# Patient Record
Sex: Male | Born: 1935 | Race: White | Hispanic: No | Marital: Married | Smoking: Never smoker
Health system: Southern US, Community
[De-identification: ages and names within clinical notes are randomized; demographics above are authoritative.]

## PROBLEM LIST (undated history)

## (undated) DIAGNOSIS — I2510 Atherosclerotic heart disease of native coronary artery without angina pectoris: Secondary | ICD-10-CM

## (undated) DIAGNOSIS — I350 Nonrheumatic aortic (valve) stenosis: Secondary | ICD-10-CM

## (undated) DIAGNOSIS — M1990 Unspecified osteoarthritis, unspecified site: Secondary | ICD-10-CM

## (undated) DIAGNOSIS — I70229 Atherosclerosis of native arteries of extremities with rest pain, unspecified extremity: Secondary | ICD-10-CM

## (undated) DIAGNOSIS — I209 Angina pectoris, unspecified: Secondary | ICD-10-CM

## (undated) DIAGNOSIS — I998 Other disorder of circulatory system: Secondary | ICD-10-CM

## (undated) DIAGNOSIS — I480 Paroxysmal atrial fibrillation: Secondary | ICD-10-CM

## (undated) DIAGNOSIS — I739 Peripheral vascular disease, unspecified: Secondary | ICD-10-CM

## (undated) DIAGNOSIS — N184 Chronic kidney disease, stage 4 (severe): Secondary | ICD-10-CM

## (undated) DIAGNOSIS — R011 Cardiac murmur, unspecified: Secondary | ICD-10-CM

## (undated) DIAGNOSIS — I10 Essential (primary) hypertension: Secondary | ICD-10-CM

## (undated) DIAGNOSIS — D689 Coagulation defect, unspecified: Secondary | ICD-10-CM

## (undated) DIAGNOSIS — F419 Anxiety disorder, unspecified: Secondary | ICD-10-CM

## (undated) DIAGNOSIS — K219 Gastro-esophageal reflux disease without esophagitis: Secondary | ICD-10-CM

## (undated) HISTORY — PX: OTHER SURGICAL HISTORY: SHX169

## (undated) HISTORY — PX: PROSTATE SURGERY: SHX751

## (undated) HISTORY — DX: Atherosclerosis of native arteries of extremities with rest pain, unspecified extremity: I70.229

## (undated) HISTORY — PX: ENDARTERECTOMY: SHX5162

## (undated) HISTORY — DX: Atherosclerotic heart disease of native coronary artery without angina pectoris: I25.10

## (undated) HISTORY — DX: Other disorder of circulatory system: I99.8

## (undated) HISTORY — DX: Nonrheumatic aortic (valve) stenosis: I35.0

## (undated) HISTORY — PX: CARDIAC VALVE REPLACEMENT: SHX585

## (undated) HISTORY — PX: CARDIAC CATHETERIZATION: SHX172

## (undated) HISTORY — DX: Coagulation defect, unspecified: D68.9

---

## 2005-10-07 ENCOUNTER — Ambulatory Visit (HOSPITAL_BASED_OUTPATIENT_CLINIC_OR_DEPARTMENT_OTHER): Admission: RE | Admit: 2005-10-07 | Discharge: 2005-10-07 | Payer: Self-pay | Admitting: Urology

## 2005-10-07 ENCOUNTER — Encounter (INDEPENDENT_AMBULATORY_CARE_PROVIDER_SITE_OTHER): Payer: Self-pay | Admitting: Specialist

## 2008-10-03 ENCOUNTER — Ambulatory Visit (HOSPITAL_BASED_OUTPATIENT_CLINIC_OR_DEPARTMENT_OTHER): Admission: RE | Admit: 2008-10-03 | Discharge: 2008-10-03 | Payer: Self-pay | Admitting: Urology

## 2008-10-03 ENCOUNTER — Encounter (INDEPENDENT_AMBULATORY_CARE_PROVIDER_SITE_OTHER): Payer: Self-pay | Admitting: Urology

## 2010-06-03 HISTORY — PX: OTHER SURGICAL HISTORY: SHX169

## 2010-06-04 ENCOUNTER — Ambulatory Visit (HOSPITAL_COMMUNITY): Admission: RE | Admit: 2010-06-04 | Discharge: 2010-06-05 | Payer: Self-pay | Admitting: Cardiovascular Disease

## 2010-10-27 LAB — URINE MICROSCOPIC-ADD ON

## 2010-10-27 LAB — URINALYSIS, ROUTINE W REFLEX MICROSCOPIC
Leukocytes, UA: NEGATIVE
Nitrite: NEGATIVE
Specific Gravity, Urine: 1.013 (ref 1.005–1.030)
Urobilinogen, UA: 0.2 mg/dL (ref 0.0–1.0)

## 2010-10-27 LAB — BASIC METABOLIC PANEL
BUN: 34 mg/dL — ABNORMAL HIGH (ref 6–23)
Calcium: 8.6 mg/dL (ref 8.4–10.5)
Chloride: 110 mEq/L (ref 96–112)
Creatinine, Ser: 2.03 mg/dL — ABNORMAL HIGH (ref 0.4–1.5)
GFR calc Af Amer: 38 mL/min — ABNORMAL LOW (ref >60)
GFR calc non Af Amer: 32 mL/min — ABNORMAL LOW (ref >60)
Potassium: 4.8 mEq/L (ref 3.5–5.1)
Sodium: 135 mEq/L (ref 135–145)

## 2010-10-27 LAB — GLUCOSE, CAPILLARY
Glucose-Capillary: 117 mg/dL — ABNORMAL HIGH (ref 70–99)
Glucose-Capillary: 189 mg/dL — ABNORMAL HIGH (ref 70–99)

## 2010-10-27 LAB — CBC
HCT: 34.2 % — ABNORMAL LOW (ref 39.0–52.0)
MCV: 91 fL (ref 78.0–100.0)
RDW: 13.9 % (ref 11.5–15.5)
WBC: 7.7 10*3/uL (ref 4.0–10.5)

## 2010-10-27 LAB — TSH: TSH: 3.734 u[IU]/mL (ref 0.350–4.500)

## 2010-10-27 LAB — APTT: aPTT: 29 seconds (ref 24–37)

## 2010-11-30 LAB — POCT I-STAT 4, (NA,K, GLUC, HGB,HCT)
Glucose, Bld: 107 mg/dL — ABNORMAL HIGH (ref 70–99)
HCT: 38 % — ABNORMAL LOW (ref 39.0–52.0)
Hemoglobin: 12.9 g/dL — ABNORMAL LOW (ref 13.0–17.0)
Potassium: 4.3 mEq/L (ref 3.5–5.1)
Sodium: 142 mEq/L (ref 135–145)

## 2010-11-30 LAB — GLUCOSE, CAPILLARY: Glucose-Capillary: 134 mg/dL — ABNORMAL HIGH (ref 70–99)

## 2010-12-28 NOTE — Op Note (Signed)
Collin Pearson, Collin Pearson              ACCOUNT NO.:  [id number]   MEDICAL RECORD NO.:  [id number]          PATIENT TYPE:  AMB   LOCATION:  [facility]                         FACILITY:  [facility]   PHYSICIAN:  Mark C. Karsten Ro, M.D.  DATE OF BIRTH:  1927/06/02   DATE OF PROCEDURE:  DATE OF DISCHARGE:                               OPERATIVE REPORT   PREOPERATIVE DIAGNOSES:  1. 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  2. History of gross hematuria.   PROCEDURES:  1. Cystoscopy.  2. Bilateral ureteral catheterization.  3. Bladder biopsy.   SURGEON:  Penny Pia, M.D.   ANESTHESIA:  General.   SPECIMENS:  1. Right renal pelvis barbotage for cytology.  2. Left renal pelvis barbotage for cytology.  3. Bladder barbotage for cytology.  4. Cold cup bladder biopsies from the right wall,left wall and left      posterior wall.   Marland KitchenDRAINS:  None.   BLOOD LOSS:  Minimal.   COMPLICATIONS:  None.   INDICATIONS:  The patient is a 77 year old male who had an episode of  gross hematuria 1 month ago.  It was primarily terminal hematuria and he  was evaluated and found to have a negative NMP 22, CT scan and office  cystoscopy.  He has no prior history of transitional cell carcinoma.  However, a urine cytology was performed and revealed atypical urothelial  cells.  He, therefore, is brought to the operating room for further  evaluation of this finding.  The risks, complications, alternatives and  limitations have been discussed.  He understands and has elected to  proceed.  He has stopped his Plavix.   DESCRIPTION OF OPERATION:  After informed consent, the patient was  brought to the major OR, placed on the table, administered general  anesthesia and then moved to the dorsal lithotomy position.  His  genitalia was sterilely prepped and draped and an official time-out was  then performed.  Initially, the 22-French cystoscope with 12 degrees  lens was inserted per urethra and under direct visualization passed down  through  the urethra which was noted be normal, through the sphincter and  into the area of the prostatic urethra which revealed no evidence of  obstruction or lesions.  I then entered the bladder and noted no  specific abnormality.  The bladder was then fully inspected and noted to  be free of any tumor, stones or inflammatory lesions.   I then used saline to fill the bladder and then barbotage the bladder  with this saline being sent for cytology labeled bladder barbotage.   The right ureteral orifice was identified and a 0.038 inch floppy tip  guidewire was then passed up the right ureter under direct fluoroscopic  control into the area of the renal pelvis.  A 6-French open-ended  ureteral catheter was then passed over the guidewire into the area of  the renal pelvis and the guidewire was removed.  I instilled 10 mL of  sterile saline through the open-ended catheter and performed barbotage  of the right renal pelvis and this fluid was then removed and sent to  Pathology labeled barbotage of right  renal pelvis.  I then removed the  open-ended catheter and performed an identical procedure on the left-  hand side and sent this labeled left renal pelvis barbotage.   I then inserted the cold cup biopsy forceps and did note an area that  appeared to be slightly erythematous on the left-hand wall although it  did not appear suspicious.  It was biopsied and labeled left lateral  wall biopsy.  A right lateral wall biopsy was obtained also from an area  that was slightly reddened and then a third cold cup biopsy from the  left posterior wall was obtained.  The Bugbee electrode was then  inserted and the biopsy sites were fulgurated.  The bladder was then  inspected and noted to be fully intact without further bleeding so the  bladder was drained and the cystoscope was removed and the patient was  awakened and taken to the recovery room in stable satisfactory  condition.  He tolerated the procedure well  with no intraoperative  complications.   He will be given a prescription for 200 mg Pyridium #36 and Vicodin #24.  He will follow-up in my office in 1-2 weeks.      Mark C. Karsten Ro, M.D.  Electronically Signed     MCO/MEDQ  D:  10/03/2008  T:  10/03/2008  Job:  [id number]

## 2010-12-31 NOTE — Op Note (Signed)
Collin Pearson, Collin Pearson              ACCOUNT NO.:  [id number]   MEDICAL RECORD NO.:  [id number]          PATIENT TYPE:  AMB   LOCATION:  [facility]                         FACILITY:  [facility]   PHYSICIAN:  Collin Pearson, M.D.  DATE OF BIRTH:  1926-08-21   DATE OF PROCEDURE:  10/07/2005  DATE OF DISCHARGE:                                 OPERATIVE REPORT   PREOPERATIVE DIAGNOSIS:  Frequency, rule out carcinoma in situ versus  interstitial cystitis.   POSTOPERATIVE DIAGNOSIS:  Frequency, rule out carcinoma in situ versus  interstitial cystitis.   PROCEDURE:  Cystoscopy, bladder biopsies and hydrodistention.   SURGEON:  Collin Pearson, M.D.   ANESTHESIA:  General.   SPECIMENS:  Cold cup biopsies of the bladder.   ESTIMATED BLOOD LOSS:  Minimal.   COMPLICATIONS:  None.   DRAINS:  None.   INDICATIONS:  The patient is a 77 year old white male who has been under a  urologist's care for some time and has not seen any improvement. I have  reviewed notes where about 10 years ago, he underwent laser prostatectomy at  [facility]. He was found at that time by urodynamics to have a very  small capacity bladder and his chief complaint is nocturia eight to nine  times, frequency, urgency but no urge incontinence. He has been tried on  [employer], Kulm, Detrol LA,  Ditropan an Oxytrol patches, none of which  have been successful. His PVR was 60 mL. He underwent urodynamics which  revealed the patient's bladder was painfully full at 123 mL. The bladder  appeared hypersensitive and he had sensory, urgency but no bladder  instability or uninhibited contractions. He had difficulty initiating his  stream. Cystoscopy that day revealed no obstruction, 2+ trabeculation of the  bladder and there were some areas of scarring on the bladder wall consistent  with prior biopsy although I did not get any biopsy results from [facility]. He is brought to the OR today for cystoscopy under anesthesia  to  determine his bladder capacity and also to biopsy the bladder to rule out  CIS. The risks, complications and alternatives have been discussed. He  understands and elected to proceed.   DESCRIPTION OF OPERATION:  After informed consent, the patient was brought  to the major OR and placed on the table, administered general anesthesia and  then moved to the dorsal lithotomy position. His genitalia was sterilely  prepped and draped. The 22-French cystoscope was then introduced in the  urethra. I inserted the 12 degree lens and then visualized the urethra  through its entire length and noted it to be normal. The prostatic urethra  revealed that it was well resected. The veru appeared absent but the  sphincter was definitely intact. The bladder neck was wide open and there  were no lesions to the prostatic urethra and it remained well resected. Upon  entering the bladder, I noted 1-2+ trabeculation, some old scars in the  posterior wall. There were no definite erythematous patches or worrisome  lesions, no papillary lesions were identified. Ureteral orifices normal  configuration and position.   I  performed hydrodistention by filling the bladder through the cystoscope to  80 cmH2O pressure. In doing so, I noted a very odd finding in the bladder  wall. It appeared the bladder wall was so thin on both right and left sides  but more prominent more so on the right that I could see through the bladder  wall and could see perivesical fat. This was clearly fat not scar tissue and  I photographed this. This occurred only on the right and left walls. The  remainder of the bladder appeared normal. I then drained the bladder through  the cystoscope and found the bladder capacity under anesthesia at 80 cmH2O  pressure to be only 300 mL. I then reinserted the cystoscope, redistended  the bladder and remeasured again noting it remained at 300 mL.   The cold cup biopsy forceps were inserted and I took a  biopsy from the  posterior wall on the left-hand side nowhere near the area of thinning and  then also from the directly posterior wall. These locations were then  cauterized with the Bugbee electrode. I then drained the bladder and removed  the cystoscope. The patient was awakened and taken to the recovery room in  stable satisfactory condition. He tolerated the procedure well. There were  no intraoperative complications.   I am going to give him a prescription for some Vicodin ES and some Pyridium  plus. I will plan to see him back in the office in 10-14 days and will  consider having him seen by Dr. Vikki Pearson to see if he has any thoughts as  to how to improve this patient's condition, although it appears there were  minimal options which would include a Foley catheter, augmentation  cystoplasty or possibly the use of Botox although I am not sure of that.      Collin Pearson, M.D.  Electronically Signed     MCO/MEDQ  D:  10/07/2005  T:  10/10/2005  Job:  [id number]

## 2012-05-03 ENCOUNTER — Encounter: Payer: Medicare Other | Admitting: Cardiothoracic Surgery

## 2012-05-03 ENCOUNTER — Institutional Professional Consult (permissible substitution) (INDEPENDENT_AMBULATORY_CARE_PROVIDER_SITE_OTHER): Payer: Medicare Other | Admitting: Cardiothoracic Surgery

## 2012-05-03 ENCOUNTER — Encounter: Payer: Self-pay | Admitting: Cardiothoracic Surgery

## 2012-05-03 VITALS — BP 146/58 | HR 56 | Resp 16 | Ht 65.5 in | Wt 143.0 lb

## 2012-05-03 DIAGNOSIS — I350 Nonrheumatic aortic (valve) stenosis: Secondary | ICD-10-CM | POA: Insufficient documentation

## 2012-05-03 DIAGNOSIS — I2510 Atherosclerotic heart disease of native coronary artery without angina pectoris: Secondary | ICD-10-CM | POA: Insufficient documentation

## 2012-05-03 DIAGNOSIS — I359 Nonrheumatic aortic valve disorder, unspecified: Secondary | ICD-10-CM

## 2012-05-03 DIAGNOSIS — N289 Disorder of kidney and ureter, unspecified: Secondary | ICD-10-CM

## 2012-05-03 DIAGNOSIS — N189 Chronic kidney disease, unspecified: Secondary | ICD-10-CM | POA: Insufficient documentation

## 2012-05-03 DIAGNOSIS — D689 Coagulation defect, unspecified: Secondary | ICD-10-CM | POA: Insufficient documentation

## 2012-05-03 NOTE — Progress Notes (Signed)
[address]            [place] [address]          [phone]      Markis H Callender [place] Medical Record [id number] Date of Birth: 25-Mar-1927  Referring: Sanda Klein, MD Primary Care: Jenean Lindau, MD Renal: Dr Posey Pronto  Chief Complaint:    Chief Complaint  Patient presents with  . Aortic Stenosis    AORTIC VALVE STENOSIS /REFERRAL DR.CROITORU    History of Present Illness:    Patient is an 78 year old male with long history of critical aortic stenosis recent echocardiogram showed a velocity across his aortic valve of 477 cm/s. He has long-standing history of cardiovascular disease having had carotid endarterectomies bilaterally 25 years ago and stents placed in both carotid arteries, he has known coronary occlusive disease with multiple coronary stents. He also has stage IV renal disease. He comes in today to discuss the risks and options of aortic valve replacement in the setting of diffuse vascular disease and end-stage renal disease that likely will result in dialysis.  The patient has no cardiac symptoms at rest or light activity around the house. He was digging holes in the yard Saturday deployment wishes without chest pain. He does note though with much exertion he'll have chest discomfort with relief quickly when he stops. He's had no overt symptoms of congestive heart failure denies orthopnea or pedal edema he's had no syncope or presyncope.   Current Activity/ Functional Status: Patient is independent with mobility/ambulation, transfers, ADL's, IADL's.   Past Medical History  Diagnosis Date  . Clotting disorder   . Coronary artery disease   . AS (aortic stenosis)   . End stage renal disease      past surgical history: Bilateral carotid endarterectomies 25 years ago, followed by bilateral carotid stents approximately 10 years ago    family history : Patient's father died of a myocardial infarction age 3 mother died of  stroke at age 39, his brother died at age 1 after having a pig valve aortic replacement 20 years prior  History   Social History  . Marital Status: Married    Spouse Name: N/A    Number of Children: N/A  . Years of Education: N/A   Occupational History  .  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 Smoking status: Never Smoker   . Smokeless tobacco: Never Used  . Alcohol Use: No  . Drug Use: Not on file  . Sexually Active: Not on file              History  Smoking status  . Never Smoker   Smokeless tobacco  . Never Used    History  Alcohol Use: none     No Known Allergies  Current Outpatient Prescriptions  Medication Sig Dispense Refill  . acetaminophen (TYLENOL) 325 MG tablet Take 325 mg by mouth every 6 (six) hours as needed.      Marland Kitchen aspirin EC 81 MG tablet Take 81 mg by mouth 2 (two) times daily.      Marland Kitchen atenolol (TENORMIN) 50 MG tablet Take 50 mg by mouth daily.      Marland Kitchen atorvastatin (LIPITOR) 40 MG tablet Take 40  mg by mouth at bedtime.      Marland Kitchen epoetin alfa (EPOGEN,PROCRIT) 29562 UNIT/ML injection Inject 10,000 Units into the skin 3 (three) times a week.      . fenofibrate (TRICOR) 145 MG tablet Take 145 mg by mouth daily.      . ferrous fumarate (HEMOCYTE - 106 MG FE) 325 (106 FE) MG TABS Take 65 mg of iron by mouth 2 (two) times daily.      . fish oil-omega-3 fatty acids 1000 MG capsule Take 1 g by mouth 3 (three) times daily.      Marland Kitchen glipiZIDE (GLUCOTROL) 5 MG tablet Take 5 mg by mouth. TAKE  5mg  in morning, and 2.5mg  in the afternoon      . lisinopril (PRINIVIL,ZESTRIL) 10 MG tablet Take 10 mg by mouth daily.      . Multiple Vitamin (MULTIVITAMIN) capsule Take 1 capsule by mouth daily.      . niacin 500 MG tablet Take 500 mg by mouth daily with breakfast.      . NIFEdipine (PROCARDIA XL/ADALAT-CC) 90 MG 24 hr  tablet Take 90 mg by mouth daily.      . nitroGLYCERIN (NITROSTAT) 0.4 MG SL tablet Place 0.4 mg under the tongue every 5 (five) minutes as needed.      . pantoprazole (PROTONIX) 40 MG tablet Take 40 mg by mouth daily.      . solifenacin (VESICARE) 5 MG tablet Take 10 mg by mouth daily.      . Tamsulosin HCl (FLOMAX) 0.4 MG CAPS Take 0.4 mg by mouth at bedtime.      . torsemide (DEMADEX) 20 MG tablet Take 20 mg by mouth daily.           Review of Systems:     Cardiac Review of Systems: Y or N  Chest Pain [  y  ]  Resting SOB [n   ] Exertional SOB  [n  ]  Orthopnea [ n ]   Pedal Edema [ n  ]    Palpitations [n  ] Syncope  [n  ]   Presyncope [ n  ]  General Review of Systems: [Y] = yes [  ]=no Constitional: recent weight change [  ]; anorexia [  ]; fatigue Blue.Reese  ]; nausea [ n ]; night sweats [ n ]; fever [ n ]; or chills [  n];                                                                                                                                          Dental: poor dentition[n  ]; Last Dentist visit: 6 months ago  Eye : blurred vision [  ]; diplopia [   ]; vision changes [  ];  Amaurosis fugax[  ]; Resp: cough [  ];  wheezing[  ];  hemoptysis[  ]; shortness of breath[  ]; paroxysmal nocturnal  dyspnea[  ]; dyspnea on exertion[  ]; or orthopnea[  ];  GI:  gallstones[  ], vomiting[  ];  dysphagia[  ]; melena[  ];  hematochezia [  ]; heartburn[  ];   Hx of  Colonoscopy[  ]; GU: kidney stones [  ]; hematuria[ ypast ];   dysuria [  ];  nocturia[  ];  history of     obstruction [ n ];             Skin: rash, swelling[  ];, hair loss[  ];  peripheral edema[  ];  or itching[  ]; Musculosketetal: myalgias[  ];  joint swelling[  ];  joint erythema[  ];  joint pain[  ];  back pain[  ];  Heme/Lymph: bruising[  ];  bleeding[  ];  anemia[  ];  Neuro: TIA[  ];  headaches[  ];  stroke[  ];  vertigo[  ];  seizures[  ];   paresthesias[  ];  difficulty walking[  ];  Psych:depression[  ]; anxiety[   ];  Endocrine: diabetes[  ];  thyroid dysfunction[  ];  Immunizations: Flu [ y]; Pneumococcal[ y ];  Other:  Physical Exam: BP 146/58  Pulse 56  Resp 16  Ht 5' 5.5" (1.664 m)  Wt 143 lb (64.864 kg)  BMI 23.43 kg/m2  SpO2 96%  General appearance: alert, cooperative, appears stated age, fatigued and no distress Neurologic: intact Heart: regular rate and rhythm and systolic murmur: holosystolic 4/6, crescendo throughout the precordium Lungs: clear to auscultation bilaterally and normal percussion bilaterally Abdomen: soft, non-tender; bowel sounds normal; no masses,  no organomegaly Extremities: extremities normal, atraumatic, no cyanosis or edema and distal puses are not palpable at ankles bil carotid bruits   Diagnostic Studies & Laboratory data:     Recent Radiology Findings:   No results found.    Recent Lab Findings: Lab Results  Component Value Date   WBC 7.7 06/04/2010   HGB 11.2* 06/04/2010   HCT 34.2* 06/04/2010   PLT 189 06/04/2010   GLUCOSE 141* 06/05/2010   NA 135 06/05/2010   K 4.2 06/05/2010   CL 110 06/05/2010   CREATININE 2.03* 06/05/2010   BUN 31* 06/05/2010   CO2 21 06/05/2010   TSH 3.734 06/04/2010   INR 1.06 06/04/2010   most recent creatinine recorded endocrine system is 2.03 and a BUN of 31   No recent cardiac cath most recent 2011: At bedtime left main was widely patent LAD has a long stent in the proximal portion with a jailed first diagonal and 90% stenosis, small ramus, large circumflex second obtuse marginal totally occluded right coronary is a large dominant vessel with a 40-50% ostial stenosis patient had preserved left ventricular systolic function  Echocardiogram at [facility] dated 03/19/2012 showed normal aortic root size maximum velocity across the aortic valve 477 cm/s estimated MAC peak gradient of 91 gradient of 64 valve area of 0.62    Assessment / Plan:      I've reviewed with the patient the diagnosis of  critical aortic stenosis symptomatic in the setting of severe end-stage renal disease and likely need of dialysis in the near future. The risks of aortic valve replacement and coronary bypass have been reviewed in detail. I have discussed with the patient percutaneous valve replacement in addition. He would like to discuss the options with his wife before making a final decision. He'll need cardiac catheterization prior to surgery. Dr. Posey Pronto will need to coordinate with  cardiology concerning his renal function. The issue of having a dialysis graft place prior to surgery, or use a temporary catheter if dialysis is is needed in the immediate postoperative period.  The patient will call back in the next several days and let cardiology know if he wishes to proceed. He will need to be off Plavix at least 7 days prior to surgery.     Grace Isaac MD  Beeper [phone] Office [phone] 05/03/2012 1:30 PM

## 2012-05-03 NOTE — Patient Instructions (Addendum)
Aortic Stenosis Aortic stenosis, or aortic valve stenosis, is a narrowing of the aortic valve. When the aortic valve is narrowed, the valve does not open and close very well. This restricts blood flow between the left side of the heart and the aorta (the large artery which takes blood to the rest of the body). This restriction makes it hard for your heart to pump blood. This extra work can weaken your heart and can lead to heart failure. CAUSES   Causes of aortic valve stenosis can vary. Some of these can include:  Calcium deposits on the aortic valve. Calcium can buildup on the aortic valve and make it stiff. This cause of aortic stenosis is most common in people over the age of 34.   Congenital heart defect. This can occur during the development of the fetus and can result in an aortic valve defect.   Rheumatic fever. Rheumatic fever is a bacterial infection that can develop from a strep throat infection. The bacteria from rheumatic fever can attach themselves to the valve. This can cause scarring on the aortic valve, causing it to become narrow.  SYMPTOMS   Symptoms of aortic valve stenosis develop when the valve disease is severe. Symptoms can include:  Shortness of breath, especially with physical activity.   Feeling tired (fatigue).   Chest pain (angina) or tightness.   Feeing your heart race or beat funny (heart palpitations).   Dizziness or fainting.  DIAGNOSIS   Aortic stenosis is diagnosed through:  A physical exam and symptoms.   A heart murmur.   Echocardiography. This test uses sound waves to produce images of your heart.  TREATMENT    Surgery is the treatment for aortic valve stenosis.   Surgery may not be needed right away. Surgery is necessary when narrowing of the aortic valve becomes severe, and symptoms develop or become worse.   Medications cannot reverse aortic valve stenosis.  HOME CARE INSTRUCTIONS    If you have aortic stenosis, you many need to avoid  strenuous physical activity. Talk with your caregiver about what types of activities you should avoid.   If you are a woman with aortic valve stenosis and are of child-bearing age, talk to your caregiver before you become pregnant.   If you become pregnant, you will need to be monitored by your obstetrician and cardiologist throughout your pregnancy, labor and delivery, and after delivery.  SEEK IMMEDIATE MEDICAL CARE IF:  You develop chest pain or tightness.   You develop shortness of breath or difficulty breathing.   You develop lightheadedness or fainting.   You have heart palpitations or skipped heartbeats.  Document Released: 04/30/2003 Document Revised: 07/21/2011 Document Reviewed: 12/08/2009 [facility] Patient Information 2012 [place] .Chronic Renal Insufficiency Chronic renal insufficiency (also called kidney failure) occurs when there is kidney damage done. The damage prevents the kidneys from working like they should.   The kidneys do many important things. They:  Filter waste out of the blood.   Regulate the amount of water and various salts in the blood stream.   Produce chemicals that:   Prompt the bone marrow to make red blood cells.   Regulate blood pressure.   Keep calcium in balance throughout the bones and the body.  When the kidneys are damaged, they can no longer filter waste products out of the blood. These substances build up in the blood, causing illness.   CAUSES    Diabetes.   High blood pressure.   Glomerular diseases: Conditions that  damage the tiny blood vessels (glomeruli) within the kidneys, such as:   Membranous nephropathy.   IgA nephropathy.   Focal segmental glomerulosclerosis.   Poisons (such as overdoses or misuse of acetaminophen or NSAIDS, or exposure to other toxic substances).   Kidney injuries.   Kidney cancer or cancer that spreads to the kidney.   Medications such as NSAIDs. These problems are rare.   Kidney  stones.   Alport disease.   Polycystic kidneys.  SYMPTOMS   Most people do not notice symptoms of kidney failure until their kidney function drops below about 30-40% of normal. Symptoms can include:  Weakness.   Tiredness.   Frequent urination.   Intense need to urinate.   Excess bruising.   Low urine production.   Blood in the urine.   Pain in the kidney area.   Feeling sick to your stomach (nausea).   Vomiting.   Unusual bleeding.   Numbness in hands and feet.   Swelling in legs, arms and face.   Confusion.  DIAGNOSIS   Your caregiver will look for signs of kidney failure. Tests to diagnose kidney failure may include:  Urine tests: May reveal the presence of blood, protein or sugar.   Blood tests: May show low red blood cell count (anemia) or high levels of waste products (BUN and creatinine) that are normally filtered out of the bloodstream by the kidneys.   Imaging tests - These are tests that create pictures of the organs inside the abdomen, such as the kidneys. They may reveal masses growing in the kidneys or blockages to the flow of urine. Possible imaging tests may include:   Ultrasound.   CT scan.   MRI.   Intravenous pyelogram or IVP. This is a test that involves injecting dye into the bloodstream and then taking a series of x-rays of the kidneys. This allows the kidneys and other parts of the urinary system to be viewed more clearly.   Kidney biopsy - A small sample of kidney is removed using a special needle. The sample is examined for abnormalities under a microscope.  TREATMENT   Chronic kidney failure cannot usually be cured. The various symptoms are treated, and measures are taken to avoid further kidney damage. Treatment for mild to moderate kidney failure may include:  Medication for high blood pressure.   Good control of diabetes.   Medication and diet change to improve anemia.   A low-sodium, low-potassium, low-protein and/or  low-cholesterol diet.   Limiting the quantity of liquids in the diet.  Treatment for more severe kidney failure may require:  Dialysis - Mechanical methods of filtering the blood.   Kidney transplant - An operation that removes the diseased kidney and replaces it with a donated kidney.  HOME CARE INSTRUCTIONS    Take medication as told by your caregiver.   Quit smoking if you are a smoker. Talk to your caregiver about a smoking cessation program.   Follow your prescribed diet.   If you are prescribed vitamins, take them as told.  SEEK IMMEDIATE MEDICAL CARE IF:  You start to produce less urine.   You notice blood in your urine.   You have increased pain.   You have increased weakness, fatigue or confusion.   You notice new swelling.   You develop a fever.   You feel that you are having side effects of medicines prescribed.  Document Released: 05/10/2008 Document Revised: 07/21/2011 Document Reviewed: 08/23/2010 [facility] Patient Information 2012 [place].  End Stage  Kidney Disease End-stage kidney disease occurs when your kidneys no longer work well enough to support day-to-day life. It usually occurs when longstanding (chronic) kidney failure gets worse, to the point where kidney function is less than 10% of normal. At this point, the kidney function is so low that death will occur from buildup of fluids and waste products in the body. The most common cause of kidney failure is diabetes. Kidney failure is very common. ESRD (End Stage Renal Disease) almost always follows chronic kidney failure or renal insufficiency. This condition may exist for 10 to 20 years or more before developing into ESRD. SYMPTOMS    Unintentional weight loss.   Fatigue, anemia.   Generalized itching (pruritus).   Easy bruising or bleeding.   Drowsiness, lethargy.   Muscle twitching or cramps.   Skin may appear yellow or brown.   General ill feeling.   Frequent hiccups.   No or  decreased urine output.   Decreased alertness.   Coma.   Increased skin pigmentation.   Decreased sensation in the hands, feet, or other areas.  DIAGNOSIS   Your caregiver will be able to tell what is wrong by talking to you, doing an examination, and doing laboratory tests. The blood work and urinalysis will show that your kidneys are not working well enough. TREATMENT   Dialysis or kidney transplantation are the only treatments for ESRD. Your health, age and other factors determine which treatment is best. Other treatments for chronic renal failure should continue. Associated diseases that cause kidney failure should be controlled. Some of these are:  Hypertension.   Kidney stones.   Heart failure.   Obstructions of the urinary tract.   Urinary tract infections.   Glomerulonephritis.  PROGNOSIS   ESRD is fatal unless treated with dialysis or transplantation. Both of these treatments can have serious risks and consequences. The outcome varies and is unique to each individual. RISKS AND COMPLICATIONS Complications of dialysis and kidney transplantation:  Hypertension (kidneys try to raise blood pressure so they can work better).   Platelet dysfunction (cells in blood which help with clotting are defective).   Gastrointestinal loss of blood, duodenal or peptic ulcers.   Hemorrhage (bleeding problems).   Anemia (not enough blood cells are produced).   Hepatitis B, hepatitis C, liver failure (exposure may occur during dialysis).   Infection from decreased operation of white blood cells and immune system.   Multiple cancers form, from long-term immunosuppressant use.   Peripheral neuropathy (damage to your nerves).   Seizures (convulsions).   Encephalopathy, nervous system damage, dementia (changes in your brain).   Weakening of the bones, fractures, joint disorders, joint replacements are common.   Permanent skin pigmentation changes.   Skin dryness, itching,  scratching resulting in skin infection from hydration problems.   Changes in glucose metabolism.   Changes in electrolyte levels (salts in your blood).   Decreased libido, impotence (loss of interest in sex or ability to function well).   Miscarriage, menstrual irregularities, infertility.   Pericarditis (inflammation of the lining surrounding the heart).   Cardiac tamponade (fluid collection around the heart).   Heart failure in which your heart cannot pump well enough to keep up with the work.  PREVENTION   Treatment of the causes of longstanding kidney failure may delay or prevent progression to ESRD. For example, diabetes which is under strict control is less likely to cause renal failure than diabetes which is left untreated. Some causes of renal failure cannot be treated.  Document Released: 10/22/2003 Document Revised: 07/21/2011 Document Reviewed: 08/01/2005 [facility] Patient Information 2012 [place] .Aortic Valve Replacement You have a disease of one of the valves of your heart. In you or your child's case, it is the aortic valve which needs replacing. Aortic valve replacement is open heart surgery done by a heart surgeon. This operation treats problems with the aortic valve. The aortic valve is the "outflow valve" for the left side of the heart. The left side of your heart (left ventricle) is the large muscular part of the heart that pumps blood to the rest of the body. It separates the left ventricle from the aorta. When the heart squeezes down (contracts), the aortic valve is what keeps the blood from flowing back into the ventricle from the aorta. This allows the blood to keep moving through the body.   Surgery may be necessary when the valve does not open or close completely. A stenotic (narrow) valve does not let the blood leave the heart normally. This causes blood to back up in the left ventricle. This makes it hard for the heart to increase the amount of blood that it  pumps. The heart has to work harder. This may produce shortness of breath and fatigue. Problems are worse with activity.   If the valve leaflets do not meet correctly when closing, blood may leak backward into the ventricle each time the heart pumps. This is called aortic insufficiency. When some of the blood leaks backwards, the heart has to work even harder. The heart can allow for this over-work for a long time if the leakage came on slowly. Eventually, the heart fails.   Aortic valve problems may be caused by a birth defect. This is called congenital. Wear and tear can cause valves to fail. More commonly, rheumatic fever may damage the aortic valve. Occasionally, the valve may be damaged by infection. This also causes the aortic valve to leak.   DESCRIPTION OF SURGERY Aortic valves can be repaired. When the valve is too damaged to repair, the valve must be replaced. A prosthetic (artificial) valve is used to do this. Valves damaged by rheumatic disease often must be replaced.   Two types of artificial valves are available:  Mechanical valves made entirely from man-made materials.   Biological valves which are made from animal tissues or taken from a cadaver.  Each has advantages and disadvantages. The choice of which type to use should be made by you and your surgeon. Your risks, age, lifestyle, other medical problems including the decision on whether to be on blood thinners the rest of your life all will help you decide on which type of valve to use. There are a number of good MECHANICAL PROSTHESES available. All work well. The main advantage of mechanical valves is that they do not wear out. Their main disadvantage is that blood clots easier on mechanical valves. If this happens the valve will not work normally. Because of this, patients with mechanical valves must take anticoagulants (blood thinners) for life. There is also a small but definite risk of blood clots causing stroke, even when taking  anticoagulants.   There are a number of BIOLOGICAL CHOICES for aortic valve replacement. Most are made from pig aortic valves. Some are taken from cadavers. The main advantage is that they have a reduced risk of blood clots forming on the valve. This lessens the chance of the valve not working or causing a stroke. A large disadvantage of biological or tissue valves is  that they wear out sooner than mechanical valves. The rate at which they wear out depends on the patient's age. A young boy might wear out such a valve in only a few years. The same valve might last 10 years in a middle aged person, and even longer in a patient over the age of 61. A tissue valve used in a person over 54 years old may never need replacement. RISKS AND COMPLICATIONS Your cardiologist and cardiothoracic surgeon can best determine your individual risk. It will depend on your age, general condition, medical conditions, and your heart function. In general, the risks include:  Problems from the operation itself are low risk. Some common risks are:   Risks from the anesthesia.   Bleeding and infection.   Lifelong treatment with medications to prevent blood clots is needed for mechanical valve replacements.   Infection is more common with valve replacement than with valve repair.   Valve failure is more common with valve replacement than with valve repair. Pig valves tend to fail after about 8 to 10 years.  PROCEDURE   Valve repair or replacement is open-heart surgery. You are given general anesthesia (medications to help you sleep). You are then placed on a heart-lung machine. This machine provides oxygen to your blood while the heart is not working. The surgery generally lasts from 3 to 5 hours. During surgery, the surgeon makes a large incision (cut) in the chest. Sometimes the heart is cooled to slow or stop the heartbeat. The damaged aortic valve is either repaired or removed and replaced with an artificial heart  valve. AFTER THE PROCEDURE  Recovery from heart valve surgery usually involves a few days in an intensive care unit (ICU) of a hospital. Full recovery from heart valve surgery can take several months.   Anticoagulation (blood thinning) treatment with warfarin is often prescribed for 6 weeks to 3 months after surgery for those with biological valves. It is prescribed for life for those with mechanical valves.   Recovery includes healing of the surgical incision. There is a gradual building of stamina and exercise abilities. An exercise program under the direction of a physical therapist may be recommended.   Once you have an artificial valve, your heart function and your life will return to normal. You usually feel better after surgery. Shortness of breath and fatigue should lessen. If your heart was already severely damaged before your surgery, you may continue to have problems.   You can usually resume most of your normal activities. You will have to continue to monitor your condition. You need to watch out for blood clots and infections.   Artificial valves need to be replaced after a period of time. It is important that you see your caregiver regularly.   Some individuals with an aortic valve replacement need to take antibiotics before having dental work or other surgical procedures. This is called prophylactic antibiotic treatment. These drugs help to prevent infective endocarditis. Antibiotics are only recommended for individuals with the highest risk for developing infective endocarditis. Let your dentist and your caregiver know if you have a history of any of the following so that the necessary precautions can be taken:   A VSD.   A repaired VSD.   Endocarditis in the past.   An artificial (prosthetic) heart valve.  HOME CARE INSTRUCTIONS    Use all medications as prescribed.   Take your temperature every morning for the first week after surgery. Record these.   Weigh yourself  every  morning for at least the first week after surgery and record.   Do not lift more than 10 pounds (4.5 kg) until your sternum (breastbone) has healed. Avoid all activities which would place strain on your incision.   You may shower but do not take baths until instructed by your caregivers.   Avoid driving for 4 to 6 weeks following surgery or as instructed.   Use your elastic stockings during the day. You should wear the stockings for at least 2 weeks after discharge or longer if your ankles are swollen. The stockings help blood flow and help reduce swelling in the legs. It is easiest to put the stockings on before you get out of bed in the morning. They should fit snugly.  SEEK IMMEDIATE MEDICAL CARE IF:  You develop chest pain which is not coming from your incision (surgical cut) .   You develop shortness of breath.   You develop a temperature over 101 F (38.3 C).   You have a sudden weight gain. Let your caregiver know what the weight gain is.  Document Released: 12/21/2004 Document Revised: 07/21/2011 Document Reviewed: 07/28/2008 [facility] Patient Information 2012 [place] .Aortic Valve Replacement Care After Read the instructions outlined below and refer to this sheet for the next few weeks. These discharge instructions provide you with general information on caring for yourself after you leave the hospital. Your surgeon may also give you specific instructions. While your treatment has been planned according to the most current medical practices available, unavoidable complications occasionally occur. If you have any problems or questions after discharge, please call your surgeon. AFTER THE PROCEDURE  Full recovery from heart valve surgery can take several months.   Blood thinning (anticoagulation) treatment with warfarin is often prescribed for 6 weeks to 3 months after surgery for those with biological valves. It is prescribed for life for those with mechanical valves.    Recovery includes healing of the surgical incision. There is a gradual building of stamina and exercise abilities. An exercise program under the direction of a physical therapist may be recommended.   Once you have an artificial valve, your heart function and your life will return to normal. You usually feel better after surgery. Shortness of breath and fatigue should lessen. If your heart was already severely damaged before your surgery, you may continue to have problems.   You can usually resume most of your normal activities. You will have to continue to monitor your condition. You need to watch out for blood clots and infections.   Artificial valves need to be replaced after a period of time. It is important that you see your caregiver regularly.   Some individuals with an aortic valve replacement need to take antibiotics before having dental work or other surgical procedures. This is called prophylactic antibiotic treatment. These drugs help to prevent infective endocarditis. Antibiotics are only recommended for individuals with the highest risk for developing infective endocarditis. Let your dentist and your caregiver know if you have a history of any of the following so that the necessary precautions can be taken:   A VSD.   A repaired VSD.   Endocarditis in the past.   An artificial (prosthetic) heart valve.  HOME CARE INSTRUCTIONS    Use all medications as prescribed.   Take your temperature every morning for the first week after surgery. Record these.   Weigh yourself every morning for at least the first week after surgery and record.   Do not lift more  than 10 pounds (4.5 kg) until your breastbone (sternum) has healed. Avoid all activities which would place strain on your incision.   You may shower as soon as directed by your caregiver after surgery. Pat incisions dry. Do not rub incisions with washcloth or towel.   Avoid driving for 4 to 6 weeks following surgery or as  instructed.   Use your elastic stockings during the day. You should wear the stockings for at least 2 weeks after discharge or longer if your ankles are swollen. The stockings help blood flow and help reduce swelling in the legs. It is easiest to put the stockings on before you get out of bed in the morning. They should fit snugly.  Pain Control  If a prescription was given for a pain reliever, please follow your doctor's directions.   If the pain is not relieved by your medicine, becomes worse, or you have difficulty breathing, call your surgeon.  Activity  Take frequent rest periods throughout the day.   Wait one week before returning to strenuous activities such as heavy lifting (more than 10 pounds), pushing or pulling.   Talk with your doctor about when you may return to work and your exercise routine.   Do not drive while taking prescription pain medication.  Nutrition  You may resume your normal diet.   Drink plenty of fluids (6-8 glasses a day).   Eat a well-balanced diet.   Call your caregiver for persistent nausea or vomiting.  Elimination Your normal bowel function should return. If constipation should occur, you may:  Take a mild laxative.   Add fruit and bran to your diet.   Drink more fluids.   Call your doctor if constipation is not relieved.  SEEK IMMEDIATE MEDICAL CARE IF:    You develop chest pain which is not coming from your surgical cut (incision).   You develop shortness of breath or have difficulty breathing.   You develop a temperature over 101 F (38.3 C).   You have a sudden weight gain. Let your caregiver know what the weight gain is.   You develop a rash.   You develop any reaction or side effects to medications given.   You have increased bleeding from wounds.   You see redness, swelling, or have increasing pain in wounds.   You have pus coming from your wound.   You develop lightheadedness or feel faint.  Document Released:  02/17/2005 Document Revised: 07/21/2011 Document Reviewed: 05/11/2005 [facility] Patient Information 2012 [place].

## 2012-05-09 ENCOUNTER — Encounter (HOSPITAL_COMMUNITY): Payer: Self-pay | Admitting: Pharmacy Technician

## 2012-05-15 ENCOUNTER — Other Ambulatory Visit: Payer: Self-pay | Admitting: Cardiovascular Disease

## 2012-05-18 ENCOUNTER — Encounter (HOSPITAL_COMMUNITY): Payer: Self-pay | Admitting: General Practice

## 2012-05-18 ENCOUNTER — Encounter (HOSPITAL_COMMUNITY)
Admission: RE | Disposition: A | Discharge status: Home / Self Care | Payer: Self-pay | Source: Ambulatory Visit | Attending: Cardiovascular Disease

## 2012-05-18 ENCOUNTER — Ambulatory Visit (HOSPITAL_COMMUNITY)
Admission: RE | Admit: 2012-05-18 | Discharge: 2012-05-19 | Disposition: A | Discharge status: Home / Self Care | Payer: Medicare Other | Source: Ambulatory Visit | Attending: Cardiovascular Disease | Admitting: Cardiovascular Disease

## 2012-05-18 DIAGNOSIS — N184 Chronic kidney disease, stage 4 (severe): Secondary | ICD-10-CM | POA: Insufficient documentation

## 2012-05-18 DIAGNOSIS — I129 Hypertensive chronic kidney disease with stage 1 through stage 4 chronic kidney disease, or unspecified chronic kidney disease: Secondary | ICD-10-CM | POA: Insufficient documentation

## 2012-05-18 DIAGNOSIS — E119 Type 2 diabetes mellitus without complications: Secondary | ICD-10-CM | POA: Insufficient documentation

## 2012-05-18 DIAGNOSIS — Z9861 Coronary angioplasty status: Secondary | ICD-10-CM | POA: Insufficient documentation

## 2012-05-18 DIAGNOSIS — I350 Nonrheumatic aortic (valve) stenosis: Secondary | ICD-10-CM | POA: Diagnosis present

## 2012-05-18 DIAGNOSIS — I2089 Other forms of angina pectoris: Secondary | ICD-10-CM | POA: Diagnosis present

## 2012-05-18 DIAGNOSIS — I208 Other forms of angina pectoris: Secondary | ICD-10-CM | POA: Diagnosis present

## 2012-05-18 DIAGNOSIS — I359 Nonrheumatic aortic valve disorder, unspecified: Secondary | ICD-10-CM | POA: Insufficient documentation

## 2012-05-18 DIAGNOSIS — I2510 Atherosclerotic heart disease of native coronary artery without angina pectoris: Secondary | ICD-10-CM | POA: Diagnosis present

## 2012-05-18 DIAGNOSIS — T82898A Other specified complication of vascular prosthetic devices, implants and grafts, initial encounter: Secondary | ICD-10-CM | POA: Insufficient documentation

## 2012-05-18 DIAGNOSIS — N189 Chronic kidney disease, unspecified: Secondary | ICD-10-CM | POA: Diagnosis present

## 2012-05-18 DIAGNOSIS — Y831 Surgical operation with implant of artificial internal device as the cause of abnormal reaction of the patient, or of later complication, without mention of misadventure at the time of the procedure: Secondary | ICD-10-CM | POA: Insufficient documentation

## 2012-05-18 DIAGNOSIS — E785 Hyperlipidemia, unspecified: Secondary | ICD-10-CM | POA: Insufficient documentation

## 2012-05-18 DIAGNOSIS — I739 Peripheral vascular disease, unspecified: Secondary | ICD-10-CM | POA: Diagnosis present

## 2012-05-18 DIAGNOSIS — I209 Angina pectoris, unspecified: Secondary | ICD-10-CM | POA: Insufficient documentation

## 2012-05-18 HISTORY — DX: Peripheral vascular disease, unspecified: I73.9

## 2012-05-18 HISTORY — DX: Essential (primary) hypertension: I10

## 2012-05-18 HISTORY — DX: Gastro-esophageal reflux disease without esophagitis: K21.9

## 2012-05-18 HISTORY — PX: LEFT AND RIGHT HEART CATHETERIZATION WITH CORONARY ANGIOGRAM: SHX5449

## 2012-05-18 HISTORY — DX: Chronic kidney disease, stage 4 (severe): N18.4

## 2012-05-18 HISTORY — DX: Angina pectoris, unspecified: I20.9

## 2012-05-18 HISTORY — PX: CAROTID ANGIOGRAM: SHX5504

## 2012-05-18 LAB — POCT I-STAT 3, VENOUS BLOOD GAS (G3P V)
Acid-base deficit: 1 mmol/L (ref 0.0–2.0)
Bicarbonate: 23.5 mEq/L (ref 20.0–24.0)

## 2012-05-18 LAB — GLUCOSE, CAPILLARY: Glucose-Capillary: 112 mg/dL — ABNORMAL HIGH (ref 70–99)

## 2012-05-18 SURGERY — LEFT AND RIGHT HEART CATHETERIZATION WITH CORONARY ANGIOGRAM
Anesthesia: LOCAL

## 2012-05-18 MED ORDER — ASPIRIN EC 81 MG PO TBEC
81.0000 mg | DELAYED_RELEASE_TABLET | Freq: Two times a day (BID) | ORAL | Status: DC
  Administered 2012-05-18 – 2012-05-19 (×2): 81 mg via ORAL
  Filled 2012-05-18 (×3): qty 1

## 2012-05-18 MED ORDER — ALUM & MAG HYDROXIDE-SIMETH 200-200-20 MG/5ML PO SUSP: 30.0000 mL | ORAL | Status: DC | PRN

## 2012-05-18 MED ORDER — SODIUM CHLORIDE 0.9 % IV SOLN
INTRAVENOUS | Status: DC
  Administered 2012-05-18: 12:00:00 via INTRAVENOUS

## 2012-05-18 MED ORDER — LOPERAMIDE HCL 2 MG PO CAPS: 2.0000 mg | ORAL_CAPSULE | ORAL | Status: DC | PRN

## 2012-05-18 MED ORDER — HYDRALAZINE HCL 25 MG PO TABS
25.0000 mg | ORAL_TABLET | Freq: Three times a day (TID) | ORAL | Status: DC
  Administered 2012-05-18 – 2012-05-19 (×3): 25 mg via ORAL
  Filled 2012-05-18 (×5): qty 1

## 2012-05-18 MED ORDER — TAMSULOSIN HCL 0.4 MG PO CAPS
0.4000 mg | ORAL_CAPSULE | Freq: Every day | ORAL | Status: DC
  Administered 2012-05-19: 0.4 mg via ORAL
  Filled 2012-05-18: qty 1

## 2012-05-18 MED ORDER — LIDOCAINE HCL (PF) 1 % IJ SOLN
INTRAMUSCULAR | Status: AC
  Filled 2012-05-18: qty 30

## 2012-05-18 MED ORDER — GLIPIZIDE 5 MG PO TABS: 5.0000 mg | ORAL_TABLET | Freq: Two times a day (BID) | ORAL | Status: DC

## 2012-05-18 MED ORDER — ATENOLOL 50 MG PO TABS
50.0000 mg | ORAL_TABLET | Freq: Every day | ORAL | Status: DC
  Filled 2012-05-18: qty 1

## 2012-05-18 MED ORDER — CLOPIDOGREL BISULFATE 75 MG PO TABS
75.0000 mg | ORAL_TABLET | Freq: Every day | ORAL | Status: DC
  Administered 2012-05-18 – 2012-05-19 (×2): 75 mg via ORAL
  Filled 2012-05-18 (×2): qty 1

## 2012-05-18 MED ORDER — PANTOPRAZOLE SODIUM 40 MG PO TBEC
40.0000 mg | DELAYED_RELEASE_TABLET | Freq: Every day | ORAL | Status: DC
  Administered 2012-05-19: 40 mg via ORAL
  Filled 2012-05-18: qty 1

## 2012-05-18 MED ORDER — FERROUS FUMARATE 325 (106 FE) MG PO TABS
65.0000 mg | ORAL_TABLET | Freq: Two times a day (BID) | ORAL | Status: DC
  Filled 2012-05-18 (×2): qty 1

## 2012-05-18 MED ORDER — FERROUS FUMARATE 325 (106 FE) MG PO TABS
1.0000 | ORAL_TABLET | Freq: Two times a day (BID) | ORAL | Status: DC
  Administered 2012-05-19: 09:00:00 106 mg via ORAL
  Filled 2012-05-18 (×3): qty 1

## 2012-05-18 MED ORDER — ATORVASTATIN CALCIUM 40 MG PO TABS
40.0000 mg | ORAL_TABLET | Freq: Every day | ORAL | Status: DC
  Administered 2012-05-18: 40 mg via ORAL
  Filled 2012-05-18 (×2): qty 1

## 2012-05-18 MED ORDER — ATENOLOL 25 MG PO TABS
25.0000 mg | ORAL_TABLET | Freq: Every day | ORAL | Status: DC
  Administered 2012-05-18: 25 mg via ORAL
  Filled 2012-05-18 (×3): qty 1

## 2012-05-18 MED ORDER — ATENOLOL 25 MG PO TABS: 25.0000 mg | ORAL_TABLET | Freq: Two times a day (BID) | ORAL | Status: DC

## 2012-05-18 MED ORDER — NIFEDIPINE ER OSMOTIC RELEASE 90 MG PO TB24
90.0000 mg | ORAL_TABLET | Freq: Every day | ORAL | Status: DC
  Administered 2012-05-19: 90 mg via ORAL
  Filled 2012-05-18: qty 1

## 2012-05-18 MED ORDER — TORSEMIDE 10 MG PO TABS
10.0000 mg | ORAL_TABLET | Freq: Two times a day (BID) | ORAL | Status: DC
  Administered 2012-05-18 – 2012-05-19 (×2): 10 mg via ORAL
  Filled 2012-05-18 (×3): qty 1

## 2012-05-18 MED ORDER — GLIPIZIDE 5 MG PO TABS
5.0000 mg | ORAL_TABLET | Freq: Every day | ORAL | Status: DC
  Administered 2012-05-18: 5 mg via ORAL
  Filled 2012-05-18 (×2): qty 1

## 2012-05-18 MED ORDER — MORPHINE SULFATE 2 MG/ML IJ SOLN: 1.0000 mg | INTRAMUSCULAR | Status: DC | PRN

## 2012-05-18 MED ORDER — HEPARIN (PORCINE) IN NACL 2-0.9 UNIT/ML-% IJ SOLN
INTRAMUSCULAR | Status: AC
  Filled 2012-05-18: qty 1500

## 2012-05-18 MED ORDER — GLIPIZIDE 10 MG PO TABS
10.0000 mg | ORAL_TABLET | Freq: Every day | ORAL | Status: DC
  Administered 2012-05-19: 08:00:00 10 mg via ORAL
  Filled 2012-05-18 (×2): qty 1

## 2012-05-18 MED ORDER — SODIUM CHLORIDE 0.9 % IV SOLN
INTRAVENOUS | Status: DC
  Administered 2012-05-18: 08:00:00 via INTRAVENOUS

## 2012-05-18 MED ORDER — GLIPIZIDE 5 MG PO TABS: 5.0000 mg | ORAL_TABLET | Freq: Every day | ORAL | Status: DC

## 2012-05-18 MED ORDER — ONDANSETRON HCL 4 MG/2ML IJ SOLN: 4.0000 mg | Freq: Four times a day (QID) | INTRAMUSCULAR | Status: DC | PRN

## 2012-05-18 MED ORDER — ACETAMINOPHEN 325 MG PO TABS: 650.0000 mg | ORAL_TABLET | ORAL | Status: DC | PRN

## 2012-05-18 MED ORDER — SODIUM CHLORIDE 0.9 % IJ SOLN: 3.0000 mL | INTRAMUSCULAR | Status: DC | PRN

## 2012-05-18 NOTE — Op Note (Signed)
Collin Pearson is a 78 y.o. male    [id number] LOCATION:  FACILITY: [facility]  PHYSICIAN: Quay Burow, M.D. 11-15-1926   DATE OF PROCEDURE:  05/18/2012  DATE OF DISCHARGE:  [facility]  CARDIAC CATHETERIZATION     History obtained from chart review. Collin Pearson is an 78 year old married Caucasian male father of 4 bilateral children who I recently saw in the office on 05/03/2012. He has a history of CAD and peripheral vascular occlusive disease. He had stenting of his LAD, RCA and OM branches in [place]. He's had bilateral carotid endarterectomies at [facility] in [place] as well as bilateral carotid stenting she also [place]. His other problems include hypertension, hyperlipidemia, diabetes and chronic renal insufficiency with creatinines around in the 3 range a chronic clearance of less than 20. He is critical aortic stenosis which has been progressive with gradient of greater than 90 mm of mercury the valve area of 0.61 cm. He does complain of exertional chest pain and dyspnea. Carotid Dopplers have shown progressive right in-stent and the risk of restenosis of his carotid stent. Left Performed 2 years ago showed patent stents in his mid RCA, proximal LAD an occluded OM branch. He has seen Dr. Servando Snare for about a surgical evaluation for aortic valve replacement. He presents now for right and left heart catheter for his anatomy, physiology and image his right   PROCEDURE DESCRIPTION:    The patient was brought to the second floor  [place] [facility]iac cath lab in the postabsorptive state. He was not  premedicated .Marland Kitchen His right groinwas prepped and shaved in usual sterile fashion. Xylocaine 1% was used for local anesthesia. A the circumflex French sheath was inserted into the right common femoral  artery using standard Seldinger technique.a 7 [place] sheath was inserted into the right common femoral vein using standard Seldinger technique. A 7  French balloon tip thermal dilution Swan-Ganz catheter was advanced through the right heart chambers obtaining sequential pressures, arterial and venous blood samples for Fick and thermal dilution cardiac outputs. Visipaque dye was used for the entirety of the case. 5 French right and left Judkins diagnostic catheters were used for selective coronary angiography, selective right carotid angiography. The right Judkins cath the catheter was used to cross the aortic valve is 35 straight wire and a pullback gradient was obtained. A total of 30 cc of contrast was  administered to the patient.   HEMODYNAMICS:    AO SYSTOLIC/AO DIASTOLIC: 123XX123   LV SYSTOLIC/LV DIASTOLIC: 99991111, and diastolic pressure 36  RA pressure: 12/10, mean 8  Pulmonary artery pressure: 43/20, mean 33  Pulmonary capillary wedge pressure: A wave 28, BUN 31, mean 24  Aortic valve gradient 43 mm mercury   Cardiac output (Fick) 6.97 L per minute with an index of 3.96 L per minute per meter squared   Aortic valve area I checked 0.61 cm, (Thermal), 0.9 cm (thermal)    ANGIOGRAPHIC RESULTS:   1. Left main; normal  2. LAD; patent proximal stent 3. Left circumflex; occluded OM 2 stent.  4. Right coronary artery; patent stent in the mid coronary right coronary artery, dominant vessel, 70% ostial stenosis with damping using a 5 French catheter 5. Left ventriculography; not performed  6. Selective right carotid angiography: There was a 70-80% "in-stent restenosis" within the right common carotid artery stent  IMPRESSION:the patient hast patent stent in his proximal LAD, mid RCA with occluded OM 2 branch. He has damping of the ostium of the  dominant right. He has critical area of stenosis of the valve area 0.6 cm and high-grade in-stent restenosis" within his right common carotid artery stent. I believe he'll need aortic valve replacement, one-vessel bypass of his RCA, and percutaneous transposition of his right carotid  artery for in-stent restenosis prior to his coronary surgery. The sheaths were removed and pressure was held and the groin to achieve hemostasis. The patient left the Cath Lab in stable condition. Because of his stage IV chronic renal insufficiency he'll need hydration overnight, lab work in the morning and was found to be discharged home.  Lorretta Harp MD, [facility] 05/18/2012 10:18 AM

## 2012-05-18 NOTE — H&P (Signed)
  H & P will be scanned in.  Pt was reexamined and existing H & P reviewed. No changes found.  Lorretta Harp, MD [facility] 05/18/2012 9:25 AM

## 2012-05-19 LAB — BASIC METABOLIC PANEL
BUN: 42 mg/dL — ABNORMAL HIGH (ref 6–23)
CO2: 22 mEq/L (ref 19–32)
Calcium: 9.1 mg/dL (ref 8.4–10.5)
Chloride: 107 mEq/L (ref 96–112)
Creatinine, Ser: 2.74 mg/dL — ABNORMAL HIGH (ref 0.50–1.35)
GFR calc Af Amer: 23 mL/min — ABNORMAL LOW (ref >90)

## 2012-05-19 LAB — GLUCOSE, CAPILLARY: Glucose-Capillary: 102 mg/dL — ABNORMAL HIGH (ref 70–99)

## 2012-05-19 NOTE — Progress Notes (Addendum)
Subjective: No chest pain, no SOB  Objective: Vital signs in last 24 hours: Temp:  [97.6 F (36.4 C)-97.9 F (36.6 C)] 97.8 F (36.6 C) (10/05 0744) Pulse Rate:  [55-79] 56  (10/05 0744) Resp:  [13-23] 20  (10/05 0744) BP: (133-174)/(49-69) 164/54 mmHg (10/05 0744) SpO2:  [92 %-98 %] 94 % (10/05 0744) Weight:  [67.6 kg (149 lb 0.5 oz)] 67.6 kg (149 lb 0.5 oz) (10/05 0545) Weight change:    Intake/Output from previous day: -1115 10/04 0701 - 10/05 0700 In: 360 [P.O.:360] Out: 1475 [Urine:1475] Intake/Output this shift:    PE: General:alert and oriented, NAD Heart:S1S2 RRR + Murmur Lungs:clear without rales, rhonchi or wheezes Abd:+ BS, soft, non tender Ext:no edema cath site without hematoma.    Lab Results: No results found for this basename: WBC:2,HGB:2,HCT:2,PLT:2 in the last 72 hours BMET  Basename 05/19/12 0630  NA 139  K 3.8  CL 107  CO2 22  GLUCOSE 73  BUN 42*  CREATININE 2.74*  CALCIUM 9.1      Studies/Results: Rt and Lt heart cath: AO SYSTOLIC/AO DIASTOLIC: 123XX123  LV SYSTOLIC/LV DIASTOLIC: 99991111, and diastolic pressure 36  RA pressure: 12/10, mean 8  Pulmonary artery pressure: 43/20, mean 33  Pulmonary capillary wedge pressure: A wave 28, BUN 31, mean 24  Aortic valve gradient 43 mm mercury  Cardiac output (Fick) 6.97 L per minute with an index of 3.96 L per minute per meter squared  Aortic valve area I checked 0.61 cm, (Thermal), 0.9 cm (thermal)  ANGIOGRAPHIC RESULTS:  1. Left main; normal  2. LAD; patent proximal stent  3. Left circumflex; occluded OM 2 stent.  4. Right coronary artery; patent stent in the mid coronary right coronary artery, dominant vessel, 70% ostial stenosis with damping using a 5 French catheter  5. Left ventriculography; not performed  6. Selective right carotid angiography: There was a 70-80% "in-stent restenosis" within the right common carotid artery stent      Medications: I have reviewed the patient's current  medications.    Marland Kitchen aspirin EC  81 mg Oral BID  . atenolol  25 mg Oral QHS  . atenolol  50 mg Oral Daily  . atorvastatin  40 mg Oral QHS  . clopidogrel  75 mg Oral Daily  . ferrous fumarate  1 tablet Oral BID  . glipiZIDE  10 mg Oral QAC breakfast  . glipiZIDE  5 mg Oral QAC supper  . heparin      . hydrALAZINE  25 mg Oral TID  . lidocaine      . NIFEdipine  90 mg Oral Daily  . pantoprazole  40 mg Oral Daily  . Tamsulosin HCl  0.4 mg Oral Daily  . torsemide  10 mg Oral BID  . DISCONTD: atenolol  25-50 mg Oral BID  . DISCONTD: ferrous fumarate  106 mg of iron Oral BID  . DISCONTD: glipiZIDE  5 mg Oral QAC breakfast  . DISCONTD: glipiZIDE  5-10 mg Oral BID AC   Assessment/Plan: Principal Problem:  *AS (aortic stenosis), critical stenosis, plan for AVR Active Problems:  Coronary artery disease, patent stent to LAD and mid RCA with occluded OM2 branch  05/18/12,plan for CABG  CKD (chronic kidney disease) stage 4, GFR 15-29 ml/min  PVD (peripheral vascular disease), with hsx of bilateral carotid endarterectomies at [facility], and bilateral carotid stenting, with high grade stenosis of Rt. carotid 05/18/12  Angina effort  PLAN:  Per Dr. Gwenlyn Found after procedure"the patient has patent stent in  his proximal LAD, mid RCA with occluded OM 2 branch. He has damping of the ostium of the dominant right. He has critical area of stenosis of the valve area 0.6 cm and high-grade in-stent restenosis" within his right common carotid artery stent. I believe he'll need aortic valve replacement, one-vessel bypass of his RCA, and percutaneous intervention of his right carotid artery for in-stent restenosis prior to his coronary surgery."  On 05/14/12 Cr. Was 3.10 with BUN 53 Hgb was 9.5 also.  ? Recheck prior to discharge?   On Torsemide 10 mg BID at home.  Hold until Sunday, recheck BMP on Monday.   LOS: 1 day   INGOLD,LAURA R 05/19/2012, 7:57 AM   I've seen and examined the patient this morning along  with Cecilie Kicks, NP. I agree with her findings, examination, and recommendations.  The patient was well status post right upper catheterization. His renal function appears to be stable the creatinine down to 2.74 I agree with holding his diuretic today but probably restart as early as tomorrow versus Monday depending on patient's symptoms. We'll recheck labs on Monday.  Continue other home medications.  He is stable for discharge today.  The plan is for him to followup with Dr. Gwenlyn Found the plan re\re intervention on the right common carotid artery stent. He will remain on Plavix for roughly a month after which she'll be scheduled for aortic valve replacement with single vessel CABG by Dr. Servando Snare.  Leonie Man, M.D., M.S. [facility] [address] [place], [place]  [address]  [phone] Pager # [phone]  05/19/2012 10:03 AM

## 2012-05-19 NOTE — Discharge Summary (Signed)
Physician Discharge Summary  Patient ID: Collin Pearson MRN: [id number] DOB/AGE: 03-14-1927 78 y.o.  Admit date: 05/18/2012 Discharge date: 05/19/2012  Discharge Diagnoses:  Principal Problem:  *AS (aortic stenosis), critical stenosis, plan for AVR Active Problems:  Coronary artery disease, patent stent to LAD and mid RCA with occluded OM2 branch  05/18/12,plan for CABG  CKD (chronic kidney disease) stage 4, GFR 15-29 ml/min  PVD (peripheral vascular disease), with hsx of bilateral carotid endarterectomies at [facility], and bilateral carotid stenting, with high grade stenosis of Rt. carotid 05/18/12  Angina effort S/P right and left heart cath  Discharged Condition: good  PROCEDURES:Rt. And Lt. Heart cath by Dr. Gwenlyn Found 05/18/12 and Rt. Carotid vessel.  Hospital Course: 78 year old married Caucasian male recently seen by Dr. Gwenlyn Found in the office on 05/03/2012. He has a history of CAD and peripheral vascular occlusive disease. He had stenting of his LAD, RCA and OM branches in [place]. He's had bilateral carotid endarterectomies at [facility] in [place] as well as bilateral carotid stenting also.  His other problems include hypertension, hyperlipidemia, diabetes and chronic renal insufficiency with creatinines around in the 3 range a chronic clearance of less than 20. He is critical aortic stenosis which has been progressive with gradient of greater than 90 mm of mercury the valve area of 0.61 cm. He does complain of exertional chest pain and dyspnea. Carotid Dopplers have shown progressive right in-stent and the risk of restenosis of his carotid stent. Left Performed 2 years ago showed patent stents in his mid RCA, proximal LAD an occluded OM branch. He has seen Dr. Servando Snare for about a surgical evaluation for aortic valve replacement. He presented 05/18/12  for right and left heart catheter for his anatomy, physiology and image his right Carotid.  Results of  procedure:  HEMODYNAMICS:  AO SYSTOLIC/AO DIASTOLIC: 123XX123  LV SYSTOLIC/LV DIASTOLIC: 99991111, and diastolic pressure 36  RA pressure: 12/10, mean 8  Pulmonary artery pressure: 43/20, mean 33  Pulmonary capillary wedge pressure: A wave 28, BUN 31, mean 24  Aortic valve gradient 43 mm mercury  Cardiac output (Fick) 6.97 L per minute with an index of 3.96 L per minute per meter squared  Aortic valve area I checked 0.61 cm, (Thermal), 0.9 cm (thermal)  ANGIOGRAPHIC RESULTS:  1. Left main; normal  2. LAD; patent proximal stent  3. Left circumflex; occluded OM 2 stent.  4. Right coronary artery; patent stent in the mid coronary right coronary artery, dominant vessel, 70% ostial stenosis with damping using a 5 French catheter  5. Left ventriculography; not performed  6. Selective right carotid angiography: There was a 70-80% "in-stent restenosis" within the right common carotid artery stent  IMPRESSION:the patient hast patent stent in his proximal LAD, mid RCA with occluded OM 2 branch. He has damping of the ostium of the dominant right. He has critical area of stenosis of the valve area 0.6 cm and high-grade in-stent restenosis" within his right common carotid artery stent. Dr. Gwenlyn Found  believes he'll need aortic valve replacement, one-vessel bypass of his RCA, and percutaneous intervention of his right carotid artery for in-stent restenosis prior to his coronary surgery.    The pt was kept overnight in hospital for IV fluids with CKD stage 4.  By the next am, He was ambulating without complications and felt well.  Cr. Was stable.  We will hold torsemide for today and he will resume tomorrow.  Will recheck BMP on Monday.  He will follow up with Dr.  Gwenlyn Found. At discharge seen and examined by Dr. Ellyn Hack, and found to be stable.  Dr. Servando Snare is aware of the pt. And after follow up with Dr. Gwenlyn Found pt will return to Dr. Servando Snare.   Consults: None  Significant Diagnostic Studies:  BMET    Component  Value Date/Time   NA 139 05/19/2012 0630   K 3.8 05/19/2012 0630   CL 107 05/19/2012 0630   CO2 22 05/19/2012 0630   GLUCOSE 73 05/19/2012 0630   BUN 42* 05/19/2012 0630   CREATININE 2.74* 05/19/2012 0630   CALCIUM 9.1 05/19/2012 0630   GFRNONAA 20* 05/19/2012 0630   GFRAA 23* 05/19/2012 0630       Discharge Exam: Blood pressure 164/54, pulse 56, temperature 97.8 F (36.6 C), temperature source Oral, resp. rate 20, height 5' 7.5" (1.715 m), weight 67.6 kg (149 lb 0.5 oz), SpO2 94.00%.   PE: General:alert and oriented, NAD  Heart:S1S2 RRR + Murmur  Lungs:clear without rales, rhonchi or wheezes  Abd:+ BS, soft, non tender  Ext:no edema cath site without hematoma.     Disposition: Home     Medication List     As of 05/19/2012 11:18 AM    STOP taking these medications         nitroGLYCERIN 0.4 MG SL tablet   Commonly known as: NITROSTAT      TAKE these medications         aspirin EC 81 MG tablet   Take 81 mg by mouth 2 (two) times daily.      atenolol 50 MG tablet   Commonly known as: TENORMIN   Take 25-50 mg by mouth 2 (two) times daily. Takes 50 mg in the morning and 25 mg at night      atorvastatin 40 MG tablet   Commonly known as: LIPITOR   Take 40 mg by mouth at bedtime.      clopidogrel 75 MG tablet   Commonly known as: PLAVIX   Take 75 mg by mouth daily.      epoetin alfa 10000 UNIT/ML injection   Commonly known as: EPOGEN,PROCRIT   Inject 10,000 Units into the skin. Every 7 days if hema is below 11      fenofibrate 145 MG tablet   Commonly known as: TRICOR   Take 145 mg by mouth daily.      ferrous fumarate 325 (106 FE) MG Tabs   Commonly known as: HEMOCYTE - 106 mg FE   Take 65 mg of iron by mouth 2 (two) times daily.      fish oil-omega-3 fatty acids 1000 MG capsule   Take 1 g by mouth 3 (three) times daily.      glipiZIDE 10 MG tablet   Commonly known as: GLUCOTROL   Take 5-10 mg by mouth 2 (two) times daily before a meal. Takes 10 mg in the  morning and 5 mg in the evening      hydrALAZINE 25 MG tablet   Commonly known as: APRESOLINE   Take 25 mg by mouth 3 (three) times daily.      multivitamin capsule   Take 1 capsule by mouth daily.      niacin 500 MG tablet   Take 500 mg by mouth 2 (two) times daily with a meal.      NIFEdipine 90 MG 24 hr tablet   Commonly known as: ADALAT CC   Take 90 mg by mouth daily.      OVER THE COUNTER MEDICATION  Take 0.25 tablets by mouth at bedtime. Legatrin for leg cramps      pantoprazole 40 MG tablet   Commonly known as: PROTONIX   Take 40 mg by mouth daily.      solifenacin 5 MG tablet   Commonly known as: VESICARE   Take 5 mg by mouth daily.      Tamsulosin HCl 0.4 MG Caps   Commonly known as: FLOMAX   Take 0.4 mg by mouth daily.      torsemide 10 MG tablet   Commonly known as: DEMADEX   Take 10 mg by mouth 2 (two) times daily.           Follow-up Information    Follow up with Collin Harp, MD. On 05/30/2012. (at 3:30 pm)    Contact information:   [address] [place] [address] [phone]        DISCHARGE INSTRUCTIONS: Call [facility] if any bleeding, swelling or drainage at cath site.  May shower, no tub baths for 48 hours for groin sticks.   Have lab work done on Monday to check your kidney function.  No driving for 3 days.  No lifting over 5 pounds for 3 days.  Do not take Torsemide today may resume tomorrow.  Heart Healthy low salt diet. And diabetic Signed: INGOLD,Collin Pearson 05/19/2012, 11:18 AM   I saw and examined the patient in the morning along with Collin Kicks, NP. I agree with her summary above.  The patient was well status post right upper catheterization. His renal function appears to be stable the creatinine down to 2.74 I agree with holding his diuretic today but probably restart as early as tomorrow versus Monday depending on patient's symptoms. We'll recheck labs on Monday. Continue  other home medications.  He is stable for discharge today. The plan is for him to followup with Dr. Gwenlyn Found the plan re\re intervention on the right common carotid artery stent. He will remain on Plavix for roughly a month after which she'll be scheduled for aortic valve replacement with single vessel CABG by Dr. Servando Snare.    Collin Pearson, M.D., M.S. [facility] [address] [place], [place]  [address]  [phone] Pager # [phone]  05/19/2012 6:37 PM

## 2012-05-22 LAB — POCT I-STAT 3, ART BLOOD GAS (G3+)
pCO2 arterial: 32.4 mmHg — ABNORMAL LOW (ref 35.0–45.0)
pH, Arterial: 7.444 (ref 7.350–7.450)
pO2, Arterial: 65 mmHg — ABNORMAL LOW (ref 80.0–100.0)

## 2012-05-28 ENCOUNTER — Other Ambulatory Visit: Payer: Self-pay

## 2012-05-28 DIAGNOSIS — N184 Chronic kidney disease, stage 4 (severe): Secondary | ICD-10-CM

## 2012-05-28 DIAGNOSIS — Z01810 Encounter for preprocedural cardiovascular examination: Secondary | ICD-10-CM

## 2012-06-06 ENCOUNTER — Inpatient Hospital Stay (HOSPITAL_COMMUNITY)
Admission: AD | Admit: 2012-06-06 | Discharge: 2012-06-22 | DRG: 219 | Disposition: A | Discharge status: Skilled Nursing Facility | Payer: PRIVATE HEALTH INSURANCE | Source: Other Acute Inpatient Hospital | Attending: Cardiothoracic Surgery | Admitting: Cardiothoracic Surgery

## 2012-06-06 ENCOUNTER — Encounter (HOSPITAL_COMMUNITY): Payer: Self-pay | Admitting: *Deleted

## 2012-06-06 DIAGNOSIS — Y849 Medical procedure, unspecified as the cause of abnormal reaction of the patient, or of later complication, without mention of misadventure at the time of the procedure: Secondary | ICD-10-CM | POA: Diagnosis present

## 2012-06-06 DIAGNOSIS — J810 Acute pulmonary edema: Secondary | ICD-10-CM | POA: Diagnosis present

## 2012-06-06 DIAGNOSIS — I10 Essential (primary) hypertension: Secondary | ICD-10-CM | POA: Insufficient documentation

## 2012-06-06 DIAGNOSIS — I350 Nonrheumatic aortic (valve) stenosis: Secondary | ICD-10-CM | POA: Diagnosis present

## 2012-06-06 DIAGNOSIS — I359 Nonrheumatic aortic valve disorder, unspecified: Secondary | ICD-10-CM

## 2012-06-06 DIAGNOSIS — E785 Hyperlipidemia, unspecified: Secondary | ICD-10-CM | POA: Insufficient documentation

## 2012-06-06 DIAGNOSIS — I2510 Atherosclerotic heart disease of native coronary artery without angina pectoris: Secondary | ICD-10-CM | POA: Diagnosis present

## 2012-06-06 DIAGNOSIS — I739 Peripheral vascular disease, unspecified: Secondary | ICD-10-CM | POA: Diagnosis present

## 2012-06-06 DIAGNOSIS — Z951 Presence of aortocoronary bypass graft: Secondary | ICD-10-CM

## 2012-06-06 DIAGNOSIS — N289 Disorder of kidney and ureter, unspecified: Secondary | ICD-10-CM | POA: Diagnosis present

## 2012-06-06 DIAGNOSIS — I129 Hypertensive chronic kidney disease with stage 1 through stage 4 chronic kidney disease, or unspecified chronic kidney disease: Secondary | ICD-10-CM | POA: Diagnosis present

## 2012-06-06 DIAGNOSIS — E119 Type 2 diabetes mellitus without complications: Secondary | ICD-10-CM | POA: Insufficient documentation

## 2012-06-06 DIAGNOSIS — I214 Non-ST elevation (NSTEMI) myocardial infarction: Principal | ICD-10-CM | POA: Diagnosis present

## 2012-06-06 DIAGNOSIS — D649 Anemia, unspecified: Secondary | ICD-10-CM | POA: Diagnosis present

## 2012-06-06 DIAGNOSIS — Z952 Presence of prosthetic heart valve: Secondary | ICD-10-CM

## 2012-06-06 DIAGNOSIS — I480 Paroxysmal atrial fibrillation: Secondary | ICD-10-CM | POA: Diagnosis not present

## 2012-06-06 DIAGNOSIS — T82898A Other specified complication of vascular prosthetic devices, implants and grafts, initial encounter: Secondary | ICD-10-CM | POA: Diagnosis present

## 2012-06-06 DIAGNOSIS — E213 Hyperparathyroidism, unspecified: Secondary | ICD-10-CM | POA: Diagnosis present

## 2012-06-06 DIAGNOSIS — N184 Chronic kidney disease, stage 4 (severe): Secondary | ICD-10-CM | POA: Diagnosis present

## 2012-06-06 DIAGNOSIS — D62 Acute posthemorrhagic anemia: Secondary | ICD-10-CM | POA: Diagnosis not present

## 2012-06-06 HISTORY — DX: Paroxysmal atrial fibrillation: I48.0

## 2012-06-06 HISTORY — DX: Cardiac murmur, unspecified: R01.1

## 2012-06-06 LAB — MRSA PCR SCREENING: MRSA by PCR: NEGATIVE

## 2012-06-06 LAB — GLUCOSE, CAPILLARY: Glucose-Capillary: 121 mg/dL — ABNORMAL HIGH (ref 70–99)

## 2012-06-06 LAB — TROPONIN I: Troponin I: 2.49 ng/mL (ref <0.30)

## 2012-06-06 LAB — PRO B NATRIURETIC PEPTIDE: Pro B Natriuretic peptide (BNP): 15354 pg/mL — ABNORMAL HIGH (ref 0–450)

## 2012-06-06 MED ORDER — CLOPIDOGREL BISULFATE 75 MG PO TABS: 75.0000 mg | ORAL_TABLET | Freq: Every day | ORAL | Status: DC

## 2012-06-06 MED ORDER — OMEGA-3-ACID ETHYL ESTERS 1 G PO CAPS
1.0000 g | ORAL_CAPSULE | Freq: Three times a day (TID) | ORAL | Status: DC
  Administered 2012-06-06 – 2012-06-12 (×19): 1 g via ORAL
  Filled 2012-06-06 (×22): qty 1

## 2012-06-06 MED ORDER — MULTIVITAMINS PO CAPS: 1.0000 | ORAL_CAPSULE | Freq: Every day | ORAL | Status: DC

## 2012-06-06 MED ORDER — NIFEDIPINE ER OSMOTIC RELEASE 90 MG PO TB24
90.0000 mg | ORAL_TABLET | Freq: Every day | ORAL | Status: DC
  Filled 2012-06-06: qty 1

## 2012-06-06 MED ORDER — GLIPIZIDE 10 MG PO TABS
10.0000 mg | ORAL_TABLET | Freq: Every day | ORAL | Status: DC
  Administered 2012-06-07 – 2012-06-12 (×6): 10 mg via ORAL
  Filled 2012-06-06 (×9): qty 2

## 2012-06-06 MED ORDER — TAMSULOSIN HCL 0.4 MG PO CAPS
0.4000 mg | ORAL_CAPSULE | Freq: Every day | ORAL | Status: DC
  Administered 2012-06-07 – 2012-06-22 (×15): 0.4 mg via ORAL
  Filled 2012-06-06 (×16): qty 1

## 2012-06-06 MED ORDER — ASPIRIN EC 81 MG PO TBEC
81.0000 mg | DELAYED_RELEASE_TABLET | Freq: Every day | ORAL | Status: DC
  Administered 2012-06-07 – 2012-06-12 (×6): 81 mg via ORAL
  Filled 2012-06-06 (×7): qty 1

## 2012-06-06 MED ORDER — NITROGLYCERIN 0.4 MG SL SUBL: 0.4000 mg | SUBLINGUAL_TABLET | SUBLINGUAL | Status: DC | PRN

## 2012-06-06 MED ORDER — ASPIRIN 81 MG PO CHEW
324.0000 mg | CHEWABLE_TABLET | ORAL | Status: AC
  Administered 2012-06-06: 324 mg via ORAL

## 2012-06-06 MED ORDER — NIFEDIPINE ER OSMOTIC RELEASE 90 MG PO TB24
90.0000 mg | ORAL_TABLET | Freq: Every day | ORAL | Status: DC
  Administered 2012-06-07: 90 mg via ORAL
  Filled 2012-06-06: qty 1

## 2012-06-06 MED ORDER — ACETAMINOPHEN 325 MG PO TABS
650.0000 mg | ORAL_TABLET | ORAL | Status: DC | PRN
  Administered 2012-06-10: 650 mg via ORAL
  Filled 2012-06-06 (×2): qty 2

## 2012-06-06 MED ORDER — ADULT MULTIVITAMIN W/MINERALS CH
1.0000 | ORAL_TABLET | Freq: Every day | ORAL | Status: DC
  Administered 2012-06-07 – 2012-06-12 (×6): 1 via ORAL
  Filled 2012-06-06 (×7): qty 1

## 2012-06-06 MED ORDER — ALPRAZOLAM 0.25 MG PO TABS
0.2500 mg | ORAL_TABLET | Freq: Two times a day (BID) | ORAL | Status: DC | PRN
  Administered 2012-06-09 – 2012-06-12 (×3): 0.25 mg via ORAL
  Filled 2012-06-06 (×3): qty 1

## 2012-06-06 MED ORDER — ONDANSETRON HCL 4 MG/2ML IJ SOLN: 4.0000 mg | Freq: Four times a day (QID) | INTRAMUSCULAR | Status: DC | PRN

## 2012-06-06 MED ORDER — TRAMADOL HCL 50 MG PO TABS
50.0000 mg | ORAL_TABLET | Freq: Four times a day (QID) | ORAL | Status: DC | PRN
  Administered 2012-06-11: 50 mg via ORAL
  Filled 2012-06-06 (×3): qty 1

## 2012-06-06 MED ORDER — INSULIN ASPART 100 UNIT/ML ~~LOC~~ SOLN
0.0000 [IU] | Freq: Three times a day (TID) | SUBCUTANEOUS | Status: DC
  Administered 2012-06-07: 1 [IU] via SUBCUTANEOUS
  Administered 2012-06-07 – 2012-06-08 (×2): 2 [IU] via SUBCUTANEOUS
  Administered 2012-06-08 – 2012-06-09 (×2): 1 [IU] via SUBCUTANEOUS
  Administered 2012-06-09: 3 [IU] via SUBCUTANEOUS
  Administered 2012-06-10: 2 [IU] via SUBCUTANEOUS
  Administered 2012-06-10: 3 [IU] via SUBCUTANEOUS
  Administered 2012-06-10 – 2012-06-11 (×2): 1 [IU] via SUBCUTANEOUS
  Administered 2012-06-11 (×2): 3 [IU] via SUBCUTANEOUS
  Administered 2012-06-12 (×2): 1 [IU] via SUBCUTANEOUS
  Administered 2012-06-12: 2 [IU] via SUBCUTANEOUS

## 2012-06-06 MED ORDER — HEPARIN BOLUS VIA INFUSION
4000.0000 [IU] | Freq: Once | INTRAVENOUS | Status: AC
  Administered 2012-06-06: 4000 [IU] via INTRAVENOUS
  Filled 2012-06-06: qty 4000

## 2012-06-06 MED ORDER — HYDRALAZINE HCL 25 MG PO TABS
25.0000 mg | ORAL_TABLET | Freq: Three times a day (TID) | ORAL | Status: DC
  Administered 2012-06-06 – 2012-06-07 (×2): 25 mg via ORAL
  Filled 2012-06-06 (×4): qty 1

## 2012-06-06 MED ORDER — PANTOPRAZOLE SODIUM 40 MG PO TBEC: 40.0000 mg | DELAYED_RELEASE_TABLET | Freq: Every day | ORAL | Status: DC

## 2012-06-06 MED ORDER — ATORVASTATIN CALCIUM 40 MG PO TABS
40.0000 mg | ORAL_TABLET | Freq: Every day | ORAL | Status: DC
  Filled 2012-06-06: qty 1

## 2012-06-06 MED ORDER — PANTOPRAZOLE SODIUM 40 MG PO TBEC
40.0000 mg | DELAYED_RELEASE_TABLET | Freq: Every day | ORAL | Status: DC
  Administered 2012-06-07 – 2012-06-12 (×6): 40 mg via ORAL
  Filled 2012-06-06 (×6): qty 1

## 2012-06-06 MED ORDER — DARIFENACIN HYDROBROMIDE ER 7.5 MG PO TB24
7.5000 mg | ORAL_TABLET | Freq: Every day | ORAL | Status: DC
  Administered 2012-06-07 – 2012-06-12 (×6): 7.5 mg via ORAL
  Filled 2012-06-06 (×7): qty 1

## 2012-06-06 MED ORDER — ASPIRIN 81 MG PO CHEW
CHEWABLE_TABLET | ORAL | Status: AC
  Administered 2012-06-06: 324 mg via ORAL
  Filled 2012-06-06: qty 4

## 2012-06-06 MED ORDER — FUROSEMIDE 10 MG/ML IJ SOLN
80.0000 mg | Freq: Two times a day (BID) | INTRAMUSCULAR | Status: AC
  Administered 2012-06-07 (×2): 80 mg via INTRAVENOUS
  Filled 2012-06-06 (×3): qty 8

## 2012-06-06 MED ORDER — FUROSEMIDE 10 MG/ML IJ SOLN
INTRAMUSCULAR | Status: AC
  Administered 2012-06-06: 80 mg via INTRAVENOUS
  Filled 2012-06-06: qty 8

## 2012-06-06 MED ORDER — SODIUM CHLORIDE 0.9 % IV SOLN
INTRAVENOUS | Status: DC
  Administered 2012-06-06: 19:00:00 via INTRAVENOUS

## 2012-06-06 MED ORDER — DARIFENACIN HYDROBROMIDE ER 7.5 MG PO TB24: 7.5000 mg | ORAL_TABLET | Freq: Every day | ORAL | Status: DC

## 2012-06-06 MED ORDER — ASPIRIN 300 MG RE SUPP
300.0000 mg | RECTAL | Status: DC
  Filled 2012-06-06: qty 1

## 2012-06-06 MED ORDER — NITROGLYCERIN IN D5W 200-5 MCG/ML-% IV SOLN: 5.0000 ug/min | INTRAVENOUS | Status: DC

## 2012-06-06 MED ORDER — FERROUS FUMARATE 325 (106 FE) MG PO TABS
65.0000 mg | ORAL_TABLET | Freq: Two times a day (BID) | ORAL | Status: DC
  Administered 2012-06-06 – 2012-06-12 (×13): 106 mg via ORAL
  Filled 2012-06-06 (×16): qty 1

## 2012-06-06 MED ORDER — ZOLPIDEM TARTRATE 5 MG PO TABS
5.0000 mg | ORAL_TABLET | Freq: Every evening | ORAL | Status: DC | PRN
  Administered 2012-06-06 – 2012-06-12 (×4): 5 mg via ORAL
  Filled 2012-06-06 (×4): qty 1

## 2012-06-06 MED ORDER — INSULIN ASPART 100 UNIT/ML ~~LOC~~ SOLN
0.0000 [IU] | Freq: Every day | SUBCUTANEOUS | Status: DC
  Administered 2012-06-07: 4 [IU] via SUBCUTANEOUS
  Administered 2012-06-09 – 2012-06-11 (×3): 2 [IU] via SUBCUTANEOUS

## 2012-06-06 MED ORDER — SODIUM CHLORIDE 0.9 % IV SOLN: INTRAVENOUS | Status: DC

## 2012-06-06 MED ORDER — TAMSULOSIN HCL 0.4 MG PO CAPS
0.4000 mg | ORAL_CAPSULE | Freq: Every day | ORAL | Status: DC
  Filled 2012-06-06: qty 1

## 2012-06-06 MED ORDER — OMEGA-3 FATTY ACIDS 1000 MG PO CAPS: 1.0000 g | ORAL_CAPSULE | Freq: Three times a day (TID) | ORAL | Status: DC

## 2012-06-06 MED ORDER — HEPARIN (PORCINE) IN NACL 100-0.45 UNIT/ML-% IJ SOLN
1200.0000 [IU]/h | INTRAMUSCULAR | Status: DC
  Administered 2012-06-06: 950 [IU]/h via INTRAVENOUS
  Administered 2012-06-07 – 2012-06-08 (×2): 1050 [IU]/h via INTRAVENOUS
  Filled 2012-06-06 (×6): qty 250

## 2012-06-06 MED ORDER — ATENOLOL 25 MG PO TABS
25.0000 mg | ORAL_TABLET | Freq: Two times a day (BID) | ORAL | Status: DC
  Administered 2012-06-06 – 2012-06-07 (×2): 25 mg via ORAL
  Filled 2012-06-06 (×3): qty 1

## 2012-06-06 MED ORDER — ATORVASTATIN CALCIUM 40 MG PO TABS
40.0000 mg | ORAL_TABLET | Freq: Every day | ORAL | Status: DC
  Administered 2012-06-07 – 2012-06-15 (×9): 40 mg via ORAL
  Filled 2012-06-06 (×11): qty 1

## 2012-06-06 MED ORDER — BIOTENE DRY MOUTH MT LIQD
15.0000 mL | Freq: Two times a day (BID) | OROMUCOSAL | Status: DC
  Administered 2012-06-06 – 2012-06-08 (×4): 15 mL via OROMUCOSAL

## 2012-06-06 MED ORDER — CLOPIDOGREL BISULFATE 75 MG PO TABS
75.0000 mg | ORAL_TABLET | Freq: Every day | ORAL | Status: DC
  Filled 2012-06-06: qty 1

## 2012-06-06 MED ORDER — MORPHINE SULFATE 2 MG/ML IJ SOLN
2.0000 mg | INTRAMUSCULAR | Status: DC | PRN
  Administered 2012-06-12 – 2012-06-13 (×2): 2 mg via INTRAVENOUS
  Filled 2012-06-06 (×2): qty 1

## 2012-06-06 MED ORDER — FUROSEMIDE 10 MG/ML IJ SOLN
80.0000 mg | INTRAMUSCULAR | Status: AC
  Administered 2012-06-06: 80 mg via INTRAVENOUS

## 2012-06-06 MED ORDER — NITROGLYCERIN IN D5W 200-5 MCG/ML-% IV SOLN
INTRAVENOUS | Status: AC
  Administered 2012-06-06: 50000 ug
  Filled 2012-06-06: qty 250

## 2012-06-06 NOTE — Progress Notes (Signed)
CRITICAL VALUE ALERT  Critical value received:  Troponin 2.49, Pro BNP 15,354  Date of notification:  06/06/2012  Time of notification:  8:06 PM  Critical value read back:yes  Nurse who received alert:  Johnsie Cancel  MD notified (1st page):  Kerin Ransom, [place]  Time of first page:  8:11 PM  MD notified (2nd page):  Time of second page:  Responding MD: Kerin Ransom, PA  Time MD responded:  8:12 PM

## 2012-06-06 NOTE — Progress Notes (Signed)
ANTICOAGULATION CONSULT NOTE - Initial Consult  Pharmacy Consult for heparin Indication: chest pain/ACS  No Known Allergies  Patient Measurements: Height: 5' 5.5" (166.4 cm) Weight: 149 lb 14.6 oz (68 kg) IBW/kg (Calculated) : 62.65  Heparin Dosing Weight: 68 kg  Vital Signs: Temp: 98.5 F (36.9 C) (10/23 1824) Temp src: Oral (10/23 1824) BP: 173/79 mmHg (10/23 1845) Pulse Rate: 69  (10/23 1845)  Labs: No results found for this basename: HGB:2,HCT:3,PLT:3,APTT:3,LABPROT:3,INR:3,HEPARINUNFRC:3,CREATININE:3,CKTOTAL:3,CKMB:3,TROPONINI:3 in the last 72 hours  Estimated Creatinine Clearance: 18.1 ml/min (by C-G formula based on Cr of 2.74).   Medical History: Past Medical History  Diagnosis Date  . Clotting disorder   . Coronary artery disease   . AS (aortic stenosis)   . End stage renal disease   . Anginal pain   . Hypertension   . Diabetes mellitus   . Peripheral vascular disease   . GERD (gastroesophageal reflux disease)   . CKD (chronic kidney disease) stage 4, GFR 15-29 ml/min   . PVD (peripheral vascular disease), with hsx of bilateral carotid endarterectomies at [facility], and bilateral carotid stenting 05/18/2012  . Angina effort 05/18/2012    Medications:  Prescriptions prior to admission  Medication Sig Dispense Refill  . aspirin EC 81 MG tablet Take 81 mg by mouth 2 (two) times daily.      Marland Kitchen atenolol (TENORMIN) 50 MG tablet Take 25-50 mg by mouth 2 (two) times daily. Takes 50 mg in the morning and 25 mg at night      . atorvastatin (LIPITOR) 40 MG tablet Take 40 mg by mouth at bedtime.      . clopidogrel (PLAVIX) 75 MG tablet Take 75 mg by mouth daily.      Marland Kitchen epoetin alfa (EPOGEN,PROCRIT) 60454 UNIT/ML injection Inject 10,000 Units into the skin. Every 7 days if hema is below 11      . fenofibrate (TRICOR) 145 MG tablet Take 145 mg by mouth daily.      . ferrous fumarate (HEMOCYTE - 106 MG FE) 325 (106 FE) MG TABS Take 65 mg of iron by mouth 2 (two) times daily.       . fish oil-omega-3 fatty acids 1000 MG capsule Take 1 g by mouth 3 (three) times daily.      Marland Kitchen glipiZIDE (GLUCOTROL) 10 MG tablet Take 5-10 mg by mouth 2 (two) times daily before a meal. Takes 10 mg in the morning and 5 mg in the evening      . hydrALAZINE (APRESOLINE) 25 MG tablet Take 25 mg by mouth 3 (three) times daily.      . Multiple Vitamin (MULTIVITAMIN) capsule Take 1 capsule by mouth daily.      . niacin 500 MG tablet Take 500 mg by mouth 2 (two) times daily with a meal.       . NIFEdipine (ADALAT CC) 90 MG 24 hr tablet Take 90 mg by mouth daily.      Marland Kitchen OVER THE COUNTER MEDICATION Take 0.25 tablets by mouth at bedtime. Legatrin for leg cramps      . pantoprazole (PROTONIX) 40 MG tablet Take 40 mg by mouth daily.      . solifenacin (VESICARE) 5 MG tablet Take 5 mg by mouth daily.       . Tamsulosin HCl (FLOMAX) 0.4 MG CAPS Take 0.4 mg by mouth daily.      Marland Kitchen torsemide (DEMADEX) 10 MG tablet Take 10 mg by mouth 2 (two) times daily.  Assessment: 78 year old man with known CAD and AS to be admitted for ACS.   Goal of Therapy:  Heparin level 0.3-0.7 units/ml Monitor platelets by anticoagulation protocol: Yes   Plan:  Give 4000 units bolus x 1 Start heparin infusion at 950 units/hr Check anti-Xa level in 8 hours and daily while on heparin Continue to monitor H&H and platelets  Judie Bonus Gearldine Bienenstock 06/06/2012,6:57 PM

## 2012-06-06 NOTE — H&P (Signed)
Patient ID: MERIT HERPEL MRN: [id number], DOB/AGE: [birth year]   Admit date: 06/06/2012   Primary Physician: Jenean Lindau, MD Primary Cardiologist: Dr Gwenlyn Found  HPI: 78 y/o male followed by Dr Gwenlyn Found with a history of CAD, critical AS, and PVD. He was just admitted earlier this month for cath which revealed an occluded OM and disease in the RCA. He also had 70-80% ISR of his Rt common carotid. His AVA was 0.6 and his pk gradient was 13mmhg. Dr Gwenlyn Found felt he would need carotid PTA followed by CABG and AVR. He has not seen the surgeon yet. Last night he had SSCP while taking a bath. Today he mentioned his symptoms to the RN at the hematology clinic at [facility] and he was sent to the ER and transported here. In the CCU he is still SOB, still having some SSCP.   Problem List: Past Medical History  Diagnosis Date  . Clotting disorder   . Coronary artery disease   . AS (aortic stenosis)   . End stage renal disease   . Anginal pain   . Hypertension   . Diabetes mellitus   . Peripheral vascular disease   . GERD (gastroesophageal reflux disease)   . CKD (chronic kidney disease) stage 4, GFR 15-29 ml/min   . PVD (peripheral vascular disease), with hsx of bilateral carotid endarterectomies at [facility], and bilateral carotid stenting 05/18/2012  . Angina effort 05/18/2012    Past Surgical History  Procedure Date  . Endarterectomy   . Prostate surgery   . Cardiac stents   . Cardiac catheterization      Allergies: No Known Allergies   Home Medications Prescriptions prior to admission  Medication Sig Dispense Refill  . aspirin EC 81 MG tablet Take 81 mg by mouth 2 (two) times daily.      Marland Kitchen atenolol (TENORMIN) 50 MG tablet Take 25-50 mg by mouth 2 (two) times daily. Takes 50 mg in the morning and 25 mg at night      . atorvastatin (LIPITOR) 40 MG tablet Take 40 mg by mouth at bedtime.      . clopidogrel (PLAVIX) 75 MG tablet Take 75 mg by mouth daily.      Marland Kitchen epoetin alfa  (EPOGEN,PROCRIT) 16109 UNIT/ML injection Inject 10,000 Units into the skin. Every 7 days if hema is below 11      . fenofibrate (TRICOR) 145 MG tablet Take 145 mg by mouth daily.      . ferrous fumarate (HEMOCYTE - 106 MG FE) 325 (106 FE) MG TABS Take 65 mg of iron by mouth 2 (two) times daily.      . fish oil-omega-3 fatty acids 1000 MG capsule Take 1 g by mouth 3 (three) times daily.      Marland Kitchen glipiZIDE (GLUCOTROL) 10 MG tablet Take 5-10 mg by mouth 2 (two) times daily before a meal. Takes 10 mg in the morning and 5 mg in the evening      . hydrALAZINE (APRESOLINE) 25 MG tablet Take 25 mg by mouth 3 (three) times daily.      . Multiple Vitamin (MULTIVITAMIN) capsule Take 1 capsule by mouth daily.      . niacin 500 MG tablet Take 500 mg by mouth 2 (two) times daily with a meal.       . NIFEdipine (ADALAT CC) 90 MG 24 hr tablet Take 90 mg by mouth daily.      Marland Kitchen OVER THE COUNTER MEDICATION Take 0.25 tablets by mouth at bedtime.  Legatrin for leg cramps      . pantoprazole (PROTONIX) 40 MG tablet Take 40 mg by mouth daily.      . solifenacin (VESICARE) 5 MG tablet Take 5 mg by mouth daily.       . Tamsulosin HCl (FLOMAX) 0.4 MG CAPS Take 0.4 mg by mouth daily.      Marland Kitchen torsemide (DEMADEX) 10 MG tablet Take 10 mg by mouth 2 (two) times daily.         No family history on file.   History   Social History  . Marital Status: Married    Spouse Name: N/A    Number of Children: N/A  . Years of Education: N/A   Occupational History  . Not on file.   Social History Main Topics  . Smoking status: Never Smoker   . Smokeless tobacco: Never Used  . Alcohol Use: No  . Drug Use: No  . Sexually Active: Not on file   Other Topics Concern  . Not on file   Social History Narrative  . No narrative on file     Review of Systems: General: negative for chills, fever, night sweats or weight negative except as noted above.  Physical Exam: Blood pressure 180/73, pulse 71, temperature 98.5 F (36.9  C), temperature source Oral, resp. rate 22.  General appearance: alert, cooperative and mild distress Neck: Bilat JVD, bilat carotid bruits Lungs: wheezing and rales bilat Heart: regular rate and rhythm and 3/6 systolic murmur Abdomen: soft, non-tender; bowel sounds normal; no masses,  no organomegaly Extremities: no edema Pulses: 2+ and symmetric Skin: pale, cool, adry Neurologic: Grossly normal    Labs:  No results found for this or any previous visit (from the past 24 hour(s)).   Radiology/Studies: CM with vascular congestion  EKG:NSR RBBB. There are no old EKGs available for review. It appears the RBBB is old by prior telemetry strips  ASSESSMENT AND PLAN:  Principal Problem:  *Unstable angina Active Problems:  AS (aortic stenosis), critical stenosis, plan for AVR  Acute pulmonary edema  Coronary artery disease, patent stent to LAD and mid RCA with occluded OM2 branch  05/18/12,plan for CABG  CKD (chronic kidney disease) stage 4, GFR 15-29 ml/min  PVD (peripheral vascular disease), with hsx of bilateral carotid endarterectomies at [facility], and bilateral carotid stenting, with high grade stenosis of Rt. carotid 05/18/12  Chronic anemia  HTN (hypertension)  Dyslipidemia  Diabetes mellitus  Plan- IV Lasix, IV NTG, IV Heparin, watch for hypotension with critical AS.  Henri Medal, PA-C 06/06/2012, 6:31 PM  Agree with note written by Kerin Ransom PAC  Pt well known to me. I recently did right and left heart cath revealing critical AS and 1 VD with preserved LV fxn. He also has CRI and high grade ISR in Right CAS. The original plan was to do re-stenting of right carotid followed by 4-6 wks of DAPT then planned elective CABG/AVR. Over the past few days he has developed increasing SOB and SSCP. He is clearly volume overloaded and in CHF. Currently on IV hep/ntg and getting iv diuretics with a good diuresis feeling clinically improved.  I think that he has declared himself  and we will probably need to bite the bullet and do CABG/AVR first (hopefully early next week) after he is diuresed and stabilized. We can address his carotid disease at a latter date. I suspect that this will accelerate his need for HD.  Lorretta Harp 06/06/2012 9:32 PM

## 2012-06-07 LAB — BASIC METABOLIC PANEL
BUN: 46 mg/dL — ABNORMAL HIGH (ref 6–23)
CO2: 23 mEq/L (ref 19–32)
Calcium: 9 mg/dL (ref 8.4–10.5)
Chloride: 102 mEq/L (ref 96–112)
Creatinine, Ser: 3.2 mg/dL — ABNORMAL HIGH (ref 0.50–1.35)
GFR calc Af Amer: 19 mL/min — ABNORMAL LOW (ref >90)
GFR calc non Af Amer: 17 mL/min — ABNORMAL LOW (ref >90)
Glucose, Bld: 137 mg/dL — ABNORMAL HIGH (ref 70–99)
Potassium: 3.7 mEq/L (ref 3.5–5.1)
Sodium: 137 mEq/L (ref 135–145)

## 2012-06-07 LAB — CBC
MCHC: 32.3 g/dL (ref 30.0–36.0)
Platelets: 203 10*3/uL (ref 150–400)
RDW: 15.4 % (ref 11.5–15.5)

## 2012-06-07 LAB — TROPONIN I: Troponin I: 1.87 ng/mL (ref <0.30)

## 2012-06-07 LAB — GLUCOSE, CAPILLARY

## 2012-06-07 LAB — TSH: TSH: 2.764 u[IU]/mL (ref 0.350–4.500)

## 2012-06-07 LAB — HEPARIN LEVEL (UNFRACTIONATED): Heparin Unfractionated: 0.31 IU/mL (ref 0.30–0.70)

## 2012-06-07 MED ORDER — FUROSEMIDE 80 MG PO TABS
80.0000 mg | ORAL_TABLET | Freq: Every day | ORAL | Status: DC
  Administered 2012-06-08: 80 mg via ORAL
  Filled 2012-06-07 (×2): qty 1

## 2012-06-07 MED ORDER — ATENOLOL 25 MG PO TABS
25.0000 mg | ORAL_TABLET | Freq: Every day | ORAL | Status: DC
  Administered 2012-06-08 – 2012-06-12 (×5): 25 mg via ORAL
  Filled 2012-06-07 (×6): qty 1

## 2012-06-07 MED ORDER — POTASSIUM CHLORIDE CRYS ER 20 MEQ PO TBCR
20.0000 meq | EXTENDED_RELEASE_TABLET | Freq: Once | ORAL | Status: AC
  Administered 2012-06-07: 20 meq via ORAL
  Filled 2012-06-07: qty 1

## 2012-06-07 MED ORDER — NITROGLYCERIN 2 % TD OINT
0.5000 [in_us] | TOPICAL_OINTMENT | Freq: Three times a day (TID) | TRANSDERMAL | Status: DC
  Administered 2012-06-07 – 2012-06-11 (×12): 0.5 [in_us] via TOPICAL
  Filled 2012-06-07: qty 30

## 2012-06-07 MED FILL — Morphine Sulfate Inj 10 MG/ML: INTRAMUSCULAR | Qty: 1 | Status: AC

## 2012-06-07 NOTE — Care Management Note (Unsigned)
    Page 1 of 2   06/21/2012     4:51:08 PM   CARE MANAGEMENT NOTE 06/21/2012  Patient:  Collin Pearson, Collin Pearson   Account Number:  [id number]  Date Initiated:  06/07/2012  Documentation initiated by:  Elissa Hefty  Subjective/Objective Assessment:   adm  w angina     Action/Plan:   lives w wife, pcp dr Jenean Lindau   Anticipated DC Date:  06/21/2012   Anticipated DC Plan:  [facility] referral  Clinical Social Worker      DC Planning Services  CM consult      Choice offered to / List presented to:             Status of service:  In process, will continue to follow Medicare Important Message given?   (If response is "NO", the following Medicare IM given date fields will be blank) Date Medicare IM given:   Date Additional Medicare IM given:    Discharge Disposition:    Per UR Regulation:  Reviewed for med. necessity/level of care/duration of stay  If discussed at Speed of Stay Meetings, dates discussed:   06/12/2012  06/19/2012  06/21/2012    Comments:  Contact:  Alfonse Spruce   [id number]   [phone]                 Hutcherson,Chrstyal Daughter [phone]  06/21/12 Khristy Kalan,RN,BSN [phone] INFORMED BY CSW OF POSSIBILTY THAT INSURANCE MAY DENY SNF STAY.  SPOKE WITH [name] WIFE...SHE IS CONCERNED ABOUT CARING FOR PT AT HOME. SHE IS CURRENTLY ATTEMPTING TO MAKE ARRANGEMENTS FOR HIM TO HAVE SOMEONE THERE 24H/DAY, AS SHE STILL WORKS.  AWAITING OFFICIAL WORD, BUT HOPEFUL FOR APPROVAL FOR SNF.  IF NOT, PT WILL HAVE TO DC HOME TOMORROW WITH FAMILY AND HOME HEALTH CARE.   06/19/12 Kaiyon Hynes,RN,BSN [phone] SPOKE WITH WIFE, LINDA TO CONFIRM DC PLANS.  WIFE STATES SHE WORKS AND CANNOT PROVIDE 24HR CARE AT DISCHARGE.  SHE THINKS PT WILL NEED SNF FOR REHAB AT DC, AND HAS DISCUSSED THIS WITH PATIENT.  WILL CONSULT CSW TO FACILITATE DC TO SNF FOR REHAB WHEN MEDICALLY STABLE AND BED AVAILABLE.  06-14-12 7:50am Luz Lex, RNBSN [phone] CABG x1 and AVR on 10-30.  Chronic renal disease - not on dialysis.  ?? does have or need perm cath.  Renal following.  10/24 11:25p debbie dowell rn,bsn [id number]

## 2012-06-07 NOTE — Progress Notes (Signed)
Subjective:  Much better this am. No SOB or chest pain  Objective:  Vital Signs in the last 24 hours: Temp:  [98.1 F (36.7 C)-99.1 F (37.3 C)] 98.2 F (36.8 C) (10/24 0345) Pulse Rate:  [62-73] 68  (10/24 0345) Resp:  [18-26] 18  (10/24 0345) BP: (133-184)/(53-79) 170/70 mmHg (10/24 0345) SpO2:  [91 %-96 %] 95 % (10/24 0345) Weight:  [65.7 kg (144 lb 13.5 oz)-68 kg (149 lb 14.6 oz)] 65.7 kg (144 lb 13.5 oz) (10/24 0500)  Intake/Output from previous day:  Intake/Output Summary (Last 24 hours) at 06/07/12 0817 Last data filed at 06/07/12 0600  Gross per 24 hour  Intake 762.36 ml  Output   2025 ml  Net -1262.64 ml    Physical Exam: General appearance: alert, cooperative and no distress Lungs: few basilar crackles, no wheezing Heart: regular rate and rhythm and 3/6 systolic murmur - crescendo/descrescendo, late peaking Abd: soft/NT/NABS Extr: edema -- ~1-2+ RLE, trace LLE   Rate: 68  Rhythm: normal sinus rhythm  Lab Results:  Basename 06/07/12 0340  WBC 13.1*  HGB 10.1*  PLT 203    Basename 06/07/12 0326  NA 137  K 3.7  CL 102  CO2 23  GLUCOSE 137*  BUN 46*  CREATININE 3.20*    Basename 06/06/12 1857  TROPONINI 2.49*   Hepatic Function Panel No results found for this basename: PROT,ALBUMIN,AST,ALT,ALKPHOS,BILITOT,BILIDIR,IBILI in the last 72 hours No results found for this basename: CHOL in the last 72 hours No results found for this basename: INR in the last 72 hours  Imaging: Imaging results have been reviewed  Cardiac Studies:  Assessment/Plan:   Principal Problem:  *NSTEMI (non-ST elevated myocardial infarction) Active Problems:  AS (aortic stenosis), critical stenosis  Acute pulmonary edema  CAD, patent stent to LAD and mid RCA with occluded OM2 branch  05/18/12,plan for CABG/AVR  Acute on chronic renal insufficiency  PVD, with hx of bilateral CEA at [facility], and previous bilateral carotid stenting, with high grade ISR of Rt. carotid  05/18/12  Chronic anemia  HTN (hypertension)  Dyslipidemia  Diabetes mellitus  Plan- Will discuss with MD. If CABG/AVR possibly next week we will need to stop Plavix. Continue IV Lasix today, change to po and decrease to QD in AM. 24meq K+ today.  Kerin Ransom PA-C 06/07/2012, 8:17 AM  I have seen & examined the patient along with Mr. Rosalyn Gess, [place].  I agree with his findings, examination & recommendations.  78 y/o man with symptomatic Severe/Critical AS, 1 Vessel CAD, RICA ISR & CKD-4 with plans for redo carotid stent followed by AVR/CABG.  Unfortunately, he presented with acute diastolic HF with unstable angina -- volume overloaded.  His troponin is + -- ? If truly Type 1 ACS (arterial thrombus) vs. More likely type-2 MI due to AS & increased wall stress leading to microvascular ischemia.   He does feel somewhat better after diuresis & BP control.    I agree with Dr. Kennon Holter assessment that we may need to push forward with addressing his AoV as he has essentially "declared himself".   We have notified CT Surgery, who will see the patient today  Other consideration would be "bridging" therapeutic Balloon Valvuloplasty (could be done here by Dr. Burt Knack) to allow brief symptomatic relief & time to proceed with carotid stenting first.  Will hold Plavix until this decision is made  Cautious diuresis & BP control is crucial to avoid precipitous drop in BP/volume.  Diuresed well on current dose of IV  Lasix, but agree with dosing through today & reassess in AM -- anticipate decreasing intensity.  Will allow home meds to be given to determine need for additional BP control  Continue IV NTG for preload/afterload reduction  Closely monitor renal Fxn - may also be declaring himself for potential HD  Kobyn Kray W, M.D., M.S. [facility] [address] [place], [place]  [address]  [phone] Pager # [phone] 06/07/2012 8:42 AM

## 2012-06-07 NOTE — Progress Notes (Addendum)
ANTICOAGULATION CONSULT NOTE - Follow Up  Pharmacy Consult for heparin Indication: chest pain/ACS  No Known Allergies  Patient Measurements: Height: 5' 5.5" (166.4 cm) Weight: 144 lb 13.5 oz (65.7 kg) IBW/kg (Calculated) : 62.65  Heparin Dosing Weight: 68 kg  Vital Signs: Temp: 98.2 F (36.8 C) (10/24 0345) Temp src: Oral (10/24 0345) BP: 170/70 mmHg (10/24 0345) Pulse Rate: 68  (10/24 0345)  Labs:  Basename 06/07/12 0340 06/07/12 0326 06/06/12 1857  HGB 10.1* -- --  HCT 31.3* -- --  PLT 203 -- --  APTT -- -- --  LABPROT -- -- --  INR -- -- --  HEPARINUNFRC -- 0.31 --  CREATININE -- 3.20* --  CKTOTAL -- -- --  CKMB -- -- --  TROPONINI -- -- 2.49*   Estimated Creatinine Clearance: 15.5 ml/min (by C-G formula based on Cr of 3.2).  Medical History: Past Medical History  Diagnosis Date  . Clotting disorder   . Coronary artery disease   . AS (aortic stenosis)   . End stage renal disease   . Anginal pain   . Hypertension   . Diabetes mellitus   . Peripheral vascular disease   . GERD (gastroesophageal reflux disease)   . CKD (chronic kidney disease) stage 4, GFR 15-29 ml/min   . PVD (peripheral vascular disease), with hsx of bilateral carotid endarterectomies at [facility], and bilateral carotid stenting 05/18/2012  . Angina effort 05/18/2012  . Shortness of breath   . Heart murmur    Medications:  Prescriptions prior to admission  Medication Sig Dispense Refill  . aspirin 81 MG chewable tablet Chew 81 mg by mouth 2 (two) times daily.      Marland Kitchen atenolol (TENORMIN) 50 MG tablet Take 25-50 mg by mouth 2 (two) times daily. Takes 50 mg in the morning and 25 mg at night      . atorvastatin (LIPITOR) 40 MG tablet Take 40 mg by mouth at bedtime.      . clopidogrel (PLAVIX) 75 MG tablet Take 75 mg by mouth daily.      . clotrimazole-betamethasone (LOTRISONE) cream Apply 1 application topically as needed. To affected area      . epoetin alfa (EPOGEN,PROCRIT) 91478 UNIT/ML  injection Inject 10,000 Units into the skin. Every 7 days if hema is below 11      . fenofibrate (TRICOR) 145 MG tablet Take 145 mg by mouth daily.      . ferrous fumarate (HEMOCYTE - 106 MG FE) 325 (106 FE) MG TABS Take 65 mg of iron by mouth 2 (two) times daily.      . fish oil-omega-3 fatty acids 1000 MG capsule Take 1 g by mouth 3 (three) times daily.      Marland Kitchen glipiZIDE (GLUCOTROL) 10 MG tablet Take 5-10 mg by mouth 2 (two) times daily before a meal. Takes 10 mg in the morning and 5 mg in the evening      . hydrALAZINE (APRESOLINE) 25 MG tablet Take 25 mg by mouth 3 (three) times daily.      . Multiple Vitamin (MULTIVITAMIN WITH MINERALS) TABS Take 1 tablet by mouth daily.      . niacin 500 MG tablet Take 500 mg by mouth 2 (two) times daily with a meal.       . NIFEdipine (ADALAT CC) 90 MG 24 hr tablet Take 90 mg by mouth daily.      Marland Kitchen OVER THE COUNTER MEDICATION Take 0.25 tablets by mouth at bedtime. Legatrin for leg  cramps      . pantoprazole (PROTONIX) 40 MG tablet Take 40 mg by mouth daily.      . solifenacin (VESICARE) 5 MG tablet Take 5 mg by mouth daily.       . Tamsulosin HCl (FLOMAX) 0.4 MG CAPS Take 0.4 mg by mouth daily.      Marland Kitchen torsemide (DEMADEX) 10 MG tablet Take 10 mg by mouth 2 (two) times daily.       Assessment: 78 year old man with known CAD and aortic stenosis and was admitted for ongoing chest pain.  He presented to the hematology clinic at [facility] and was transported here.  He was started on IV heparin for risk reduction.  He has a known clotting disorder.   His baseline H/H is low 10.1/31.3 but no noted bleeding.  His platelets are stable at 203K.  His heparin level this morning is reported at 0.31 on an IV rate of 950 units/hr.  This is just at the bottom of goal range.  Goal of Therapy:  Heparin level 0.3-0.7 units/ml Monitor platelets by anticoagulation protocol: Yes   Plan:  1.  Will increase heparin slightly to 1050 units/hr 2.  F/U daily heparin level  and adjust as needed 3.  Monitor CBC, platelets and s/s of bleeding.   Rober Minion, PharmD., MS Clinical Pharmacist Pager:  [phone] Thank you for allowing pharmacy to be part of this patients care team. 06/07/2012,8:23 AM

## 2012-06-07 NOTE — Progress Notes (Addendum)
[employer] PA paged d/t pt's BP of 110/65 (61). Instructed to call back of SBP is less than 110 mmHg.

## 2012-06-08 ENCOUNTER — Inpatient Hospital Stay (HOSPITAL_COMMUNITY): Payer: PRIVATE HEALTH INSURANCE

## 2012-06-08 LAB — HEMOGLOBIN AND HEMATOCRIT, BLOOD
HCT: 32 % — ABNORMAL LOW (ref 39.0–52.0)
HCT: 28.8 % — ABNORMAL LOW (ref 39.0–52.0)
Hemoglobin: 10.7 g/dL — ABNORMAL LOW (ref 13.0–17.0)

## 2012-06-08 LAB — CBC
HCT: 27.6 % — ABNORMAL LOW (ref 39.0–52.0)
MCH: 29.2 pg (ref 26.0–34.0)
MCHC: 32.6 g/dL (ref 30.0–36.0)
MCV: 89.6 fL (ref 78.0–100.0)
Platelets: 193 10*3/uL (ref 150–400)
RDW: 15.1 % (ref 11.5–15.5)
WBC: 7.4 10*3/uL (ref 4.0–10.5)

## 2012-06-08 LAB — GLUCOSE, CAPILLARY
Glucose-Capillary: 179 mg/dL — ABNORMAL HIGH (ref 70–99)
Glucose-Capillary: 132 mg/dL — ABNORMAL HIGH (ref 70–99)
Glucose-Capillary: 125 mg/dL — ABNORMAL HIGH (ref 70–99)

## 2012-06-08 NOTE — Progress Notes (Signed)
[facility]  Subjective: No CP.  Breathing very well.  He did fall this AM when trying to get to the telephone.  HE hit the trash can with his arm.  Objective: Vital signs in last 24 hours: Temp:  [98.2 F (36.8 C)-98.3 F (36.8 C)] 98.3 F (36.8 C) (10/25 0338) Pulse Rate:  [51-64] 53  (10/25 0800) Resp:  [14-18] 16  (10/25 0338) BP: (101-172)/(39-61) 129/49 mmHg (10/25 0800) SpO2:  [92 %-100 %] 97 % (10/25 0800) Weight:  [66 kg (145 lb 8.1 oz)] 66 kg (145 lb 8.1 oz) (10/25 0526) Last BM Date: 06/07/12  Intake/Output from previous day: 10/24 0701 - 10/25 0700 In: 1786.4 [P.O.:1270; I.V.:500.4; IV Piggyback:16] Out: [id number] [Urine:3175] Intake/Output this shift:    Medications Current Facility-Administered Medications  Medication Dose Route Frequency Provider Last Rate Last Dose  . 0.9 %  sodium chloride infusion   Intravenous Continuous Doreene Burke [place], PA      . 0.9 %  sodium chloride infusion   Intravenous Continuous Erlene Quan, [place] 10 mL/hr at 06/06/12 1900    . acetaminophen (TYLENOL) tablet 650 mg  650 mg Oral Q4H PRN Erlene Quan, PA      . ALPRAZolam Duanne Moron) tablet 0.25 mg  0.25 mg Oral BID PRN Erlene Quan, PA      . antiseptic oral rinse (BIOTENE) solution 15 mL  15 mL Mouth Rinse BID Erlene Quan, PA   15 mL at 06/07/12 2000  . aspirin EC tablet 81 mg  81 mg Oral Daily Doreene Burke [place], [place]   81 mg at 06/07/12 0910  . atenolol (TENORMIN) tablet 25 mg  25 mg Oral Daily Doreene Burke [place], [place]      . atorvastatin (LIPITOR) tablet 40 mg  40 mg Oral QHS Doreene Burke [place], [place]   40 mg at 06/07/12 2201  . darifenacin (ENABLEX) 24 hr tablet 7.5 mg  7.5 mg Oral Daily Doreene Burke [place], [place]   7.5 mg at 06/07/12 0911  . ferrous fumarate (HEMOCYTE - 106 mg FE) tablet 106 mg of iron  106 mg of iron Oral BID Erlene Quan, PA   106 mg of iron at 06/07/12 2200  . furosemide (LASIX) injection 80 mg  80 mg Intravenous BID Erlene Quan, PA   80 mg at 06/07/12 1744  .  furosemide (LASIX) tablet 80 mg  80 mg Oral Daily Doreene Burke [place], [place]      . glipiZIDE (GLUCOTROL) tablet 10 mg  10 mg Oral Q breakfast Doreene Burke [place], [place]   10 mg at 06/07/12 0859  . heparin ADULT infusion 100 units/mL (25000 units/250 mL)  1,050 Units/hr Intravenous Continuous Tomasita Morrow, PHARMD 10.5 mL/hr at 06/07/12 1758 1,050 Units/hr at 06/07/12 1758  . insulin aspart (novoLOG) injection 0-5 Units  0-5 Units Subcutaneous QHS Doreene Burke [place], [place]   4 Units at 06/07/12 2200  . insulin aspart (novoLOG) injection 0-9 Units  0-9 Units Subcutaneous TID WC Doreene Burke [place], PA   1 Units at 06/07/12 1744  . morphine 2 MG/ML injection 2 mg  2 mg Intravenous Q2H PRN Erlene Quan, PA      . multivitamin with minerals tablet 1 tablet  1 tablet Oral Daily Troy Sine, MD   1 tablet at 06/07/12 0911  . nitroGLYCERIN (NITROGLYN) 2 % ointment 0.5 inch  0.5 inch Topical Q8H Erlene Quan, PA   0.5 inch at 06/08/12  [id number]  . nitroGLYCERIN (NITROSTAT) SL tablet 0.4 mg  0.4 mg Sublingual Q5 Min x 3 PRN Erlene Quan, PA      . omega-3 acid ethyl esters (LOVAZA) capsule 1 g  1 g Oral TID Troy Sine, MD   1 g at 06/07/12 2200  . ondansetron (ZOFRAN) injection 4 mg  4 mg Intravenous Q6H PRN Erlene Quan, PA      . pantoprazole (PROTONIX) EC tablet 40 mg  40 mg Oral Daily Doreene Burke [place], [place]   40 mg at 06/07/12 0911  . potassium chloride SA (K-DUR,KLOR-CON) CR tablet 20 mEq  20 mEq Oral Once Erlene Quan, [place]   20 mEq at 06/07/12 0910  . Tamsulosin HCl (FLOMAX) capsule 0.4 mg  0.4 mg Oral Daily Doreene Burke [place], [place]   0.4 mg at 06/07/12 0911  . traMADol (ULTRAM) tablet 50 mg  50 mg Oral Q6H PRN Erlene Quan, PA      . zolpidem (AMBIEN) tablet 5 mg  5 mg Oral QHS PRN Erlene Quan, PA   5 mg at 06/08/12 0009  . DISCONTD: atenolol (TENORMIN) tablet 25 mg  25 mg Oral BID Doreene Burke [place], [place]   25 mg at 06/07/12 0911  . DISCONTD: clopidogrel (PLAVIX) tablet 75 mg  75 mg Oral Q breakfast Troy Sine, MD      . DISCONTD:  hydrALAZINE (APRESOLINE) tablet 25 mg  25 mg Oral TID Erlene Quan, PA   25 mg at 06/07/12 0910  . DISCONTD: NIFEdipine (PROCARDIA XL/ADALAT-CC) 24 hr tablet 90 mg  90 mg Oral Daily Doreene Burke [place], [place]   90 mg at 06/07/12 0911  . DISCONTD: nitroGLYCERIN 0.2 mg/mL in dextrose 5 % infusion  5 mcg/min Intravenous Continuous Erlene Quan, PA   5 mcg/min at 06/06/12 1900    PE: General appearance: alert, cooperative and no distress Lungs: clear to auscultation bilaterally Heart: Regular rhythm.  Bradicardic  Extremities: No LEE Pulses: Radials and DPs 2+ Skin:  Warm and dry.  4-5 cm skin tear on the left, medial, anterior, arm. Neurologic:  Alert and oriented.    Lab Results:   Basename 06/08/12 0450 06/07/12 0340  WBC 7.4 13.1*  HGB 9.0* 10.1*  HCT 27.6* 31.3*  PLT 193 203   BMET  Basename 06/07/12 0326  NA 137  K 3.7  CL 102  CO2 23  GLUCOSE 137*  BUN 46*  CREATININE 3.20*  CALCIUM 9.0    Assessment/Plan  Principal Problem:  *NSTEMI (non-ST elevated myocardial infarction) Active Problems:  CAD, patent stent to LAD and mid RCA with occluded OM2 branch  05/18/12,plan for CABG/AVR  AS (aortic stenosis), critical stenosis  Acute on chronic renal insufficiency  PVD, with hx of bilateral CEA at [facility], and previous bilateral carotid stenting, with high grade ISR of Rt. carotid 05/18/12  HTN (hypertension)  Dyslipidemia  Diabetes mellitus  Chronic anemia  Acute pulmonary edema  Plan:  Pending CTS eval for AVR/CABG.  Needs Carotid stenting at some point.  Troponin Peak 2.49.  Now 1.87.  Stable but elevated SCr.  Arm was cleaned and dressed by RN.  No other apparent trauma.  2.6 L diuresed.    LOS: 2 days    HAGER, BRYAN 06/08/2012 8:34 AM  I have seen and examined the patient along with Tarri Fuller, PA.  I have reviewed the chart, notes and new data.  I agree with PA's note.  Key new complaints: walked  across room to answer phone, tripped and fell just a few minutes  ago. Exam reveals only serious injury is skin avulsion over left forearm. Bleeding easily controlled with local pressure, but will likely take a while to heal Key examination changes: late peaking aortic ejection murmur and soft S2 consistent with severe AS, no rales, JVP about 6 cm up Key new findings / data: stable creatinine, falling cTnI  PLAN: Await evaluation for high risk AVR a/CABG and will have to make a decision re: time of carotid intervention.  Sanda Klein, MD, [place] [phone] 06/08/2012, 10:37 AM

## 2012-06-08 NOTE — Progress Notes (Signed)
Tenny Craw notified of continued swelling and slight bleeding in pt's left arm from skin tear. Order for H and H received. Pharmacy notified. Heparin level drawn. Order to continue heparin gtt at this time

## 2012-06-08 NOTE — Progress Notes (Signed)
Tenny Craw, PA at bedside. Order for x-ray of ribs received and follow up H/H. Dressing to left arm reinforced with coban. Pt eating dinner at this time.

## 2012-06-08 NOTE — Progress Notes (Signed)
ANTICOAGULATION CONSULT NOTE - Follow Up Consult  Pharmacy Consult for Heparin Indication: chest pain/ACS  No Known Allergies  Patient Measurements: Height: 5' 5.5" (166.4 cm) Weight: 145 lb 8.1 oz (66 kg) IBW/kg (Calculated) : 62.65  Heparin Dosing Weight: 66kg  Vital Signs: Temp: 97.8 F (36.6 C) (10/25 1145) Temp src: Oral (10/25 1145) BP: 151/60 mmHg (10/25 1400) Pulse Rate: 58  (10/25 1400)  Labs:  Basename 06/08/12 1155 06/08/12 1052 06/08/12 0450 06/07/12 1025 06/07/12 0340 06/07/12 0326 06/06/12 1857  HGB 10.7* -- 9.0* -- -- -- --  HCT 32.0* -- 27.6* -- 31.3* -- --  PLT -- -- 193 -- 203 -- --  APTT -- -- -- -- -- -- --  LABPROT -- -- -- -- -- -- --  INR -- -- -- -- -- -- --  HEPARINUNFRC -- 0.41 -- -- -- 0.31 --  CREATININE -- -- -- -- -- 3.20* --  CKTOTAL -- -- -- -- -- -- --  CKMB -- -- -- -- -- -- --  TROPONINI -- -- -- 1.87* -- -- 2.49*    Estimated Creatinine Clearance: 15.5 ml/min (by C-G formula based on Cr of 3.2).   Medications:  Heparin @ 1050 units/hr  Assessment: 85yom with history of CAD and AS continues on heparin awaiting CVTS consult for possible AVR/CABG. Heparin level therapeutic. He did fall this morning and IV was ripped out for ~5 minutes. Had some bleeding at the IV site which is controlled now per Merrily Pew, Therapist, sports. Repeat H/H after the fall is actually improved from this morning. Cardiology aware of situation and wants heparin to continue.  Goal of Therapy:  Heparin level 0.3-0.7 units/ml Monitor platelets by anticoagulation protocol: Yes   Plan:  1) Continue heparin at 1050 units/hr 2) Follow up daily heparin level and CBC 3) Follow up CVTS consult  Deboraha Sprang 06/08/2012,2:16 PM

## 2012-06-08 NOTE — Progress Notes (Signed)
Continued bleeding noted from left forearm skin tear despite pressure dressing. Pt also c/o difficulty with deep inspiration and "pain in my ribs." Tenny Craw, PA. Notified and will come assess pt. No bleeding noted from abrasion on left back/chest area. Dressing to left arm changed. Pt otherwise resting comfortably in bed and denies further complaints. Please see vital signs trend for further reference. Monitoring closely. Heparin gtt continues as ordered.

## 2012-06-09 LAB — HEPARIN LEVEL (UNFRACTIONATED): Heparin Unfractionated: 0.18 IU/mL — ABNORMAL LOW (ref 0.30–0.70)

## 2012-06-09 LAB — GLUCOSE, CAPILLARY
Glucose-Capillary: 248 mg/dL — ABNORMAL HIGH (ref 70–99)
Glucose-Capillary: 228 mg/dL — ABNORMAL HIGH (ref 70–99)

## 2012-06-09 LAB — CBC
Hemoglobin: 9.9 g/dL — ABNORMAL LOW (ref 13.0–17.0)
MCH: 29.6 pg (ref 26.0–34.0)
Platelets: 201 10*3/uL (ref 150–400)
RBC: 3.34 MIL/uL — ABNORMAL LOW (ref 4.22–5.81)
WBC: 8.7 10*3/uL (ref 4.0–10.5)

## 2012-06-09 MED ORDER — SALINE SPRAY 0.65 % NA SOLN
1.0000 | NASAL | Status: DC | PRN
Start: 2012-06-09 — End: 2012-06-13
  Filled 2012-06-09: qty 44

## 2012-06-09 MED ORDER — FUROSEMIDE 40 MG PO TABS
40.0000 mg | ORAL_TABLET | Freq: Every day | ORAL | Status: DC
  Administered 2012-06-10: 40 mg via ORAL
  Filled 2012-06-09 (×2): qty 1

## 2012-06-09 MED ORDER — HYDRALAZINE HCL 25 MG PO TABS
25.0000 mg | ORAL_TABLET | Freq: Three times a day (TID) | ORAL | Status: DC
  Administered 2012-06-09 – 2012-06-12 (×10): 25 mg via ORAL
  Filled 2012-06-09 (×13): qty 1

## 2012-06-09 NOTE — Progress Notes (Signed)
[facility]  Subjective: He fell yesterday, wound site stable. Nasal congestion with small nasal blood clots. Will humidify air and use saline drops  Objective: Vital signs in last 24 hours: Temp:  [97.8 F (36.6 C)-99.5 F (37.5 C)] 99.5 F (37.5 C) (10/26 0755) Pulse Rate:  [50-62] 59  (10/26 0755) Resp:  [12-18] 18  (10/26 0755) BP: (118-159)/(48-95) 153/55 mmHg (10/26 0755) SpO2:  [92 %-97 %] 93 % (10/26 0755) Weight:  [64.6 kg (142 lb 6.7 oz)] 64.6 kg (142 lb 6.7 oz) (10/26 0500) Last BM Date: 06/07/12  Intake/Output from previous day: 10/25 0701 - 10/26 0700 In: 1361 [P.O.:910; I.V.:451] Out: 4650 [Urine:4650] Intake/Output this shift:    Medications Current Facility-Administered Medications  Medication Dose Route Frequency Provider Last Rate Last Dose  . 0.9 %  sodium chloride infusion   Intravenous Continuous Doreene Burke [place], PA      . 0.9 %  sodium chloride infusion   Intravenous Continuous Erlene Quan, PA 10 mL/hr at 06/09/12 0600    . acetaminophen (TYLENOL) tablet 650 mg  650 mg Oral Q4H PRN Erlene Quan, PA      . ALPRAZolam Duanne Moron) tablet 0.25 mg  0.25 mg Oral BID PRN Erlene Quan, PA      . aspirin EC tablet 81 mg  81 mg Oral Daily Doreene Burke [place], [place]   81 mg at 06/08/12 0939  . atenolol (TENORMIN) tablet 25 mg  25 mg Oral Daily Doreene Burke [place], [place]   25 mg at 06/08/12 0940  . atorvastatin (LIPITOR) tablet 40 mg  40 mg Oral QHS Doreene Burke [place], [place]   40 mg at 06/08/12 2155  . darifenacin (ENABLEX) 24 hr tablet 7.5 mg  7.5 mg Oral Daily Doreene Burke [place], PA   7.5 mg at 06/08/12 0940  . ferrous fumarate (HEMOCYTE - 106 mg FE) tablet 106 mg of iron  106 mg of iron Oral BID Erlene Quan, PA   106 mg of iron at 06/08/12 2156  . furosemide (LASIX) tablet 80 mg  80 mg Oral Daily Doreene Burke [place], [place]   80 mg at 06/08/12 0941  . glipiZIDE (GLUCOTROL) tablet 10 mg  10 mg Oral Q breakfast Doreene Burke [place], [place]   10 mg at 06/08/12 [id number]  . heparin ADULT  infusion 100 units/mL (25000 units/250 mL)  1,200 Units/hr Intravenous Continuous Troy Sine, MD 12 mL/hr at 06/09/12 0625 1,200 Units/hr at 06/09/12 0625  . insulin aspart (novoLOG) injection 0-5 Units  0-5 Units Subcutaneous QHS Doreene Burke [place], [place]   4 Units at 06/07/12 2200  . insulin aspart (novoLOG) injection 0-9 Units  0-9 Units Subcutaneous TID WC Doreene Burke [place], PA   1 Units at 06/08/12 1722  . morphine 2 MG/ML injection 2 mg  2 mg Intravenous Q2H PRN Erlene Quan, PA      . multivitamin with minerals tablet 1 tablet  1 tablet Oral Daily Troy Sine, MD   1 tablet at 06/08/12 0940  . nitroGLYCERIN (NITROGLYN) 2 % ointment 0.5 inch  0.5 inch Topical Q8H Erlene Quan, PA   0.5 inch at 06/09/12 0554  . nitroGLYCERIN (NITROSTAT) SL tablet 0.4 mg  0.4 mg Sublingual Q5 Min x 3 PRN Erlene Quan, PA      . omega-3 acid ethyl esters (LOVAZA) capsule 1 g  1 g Oral TID Troy Sine, MD   1 g at 06/08/12 2155  .  ondansetron (ZOFRAN) injection 4 mg  4 mg Intravenous Q6H PRN Erlene Quan, PA      . pantoprazole (PROTONIX) EC tablet 40 mg  40 mg Oral Daily Doreene Burke [place], [place]   40 mg at 06/08/12 0939  . sodium chloride (OCEAN) 0.65 % nasal spray 1 spray  1 spray Each Nare PRN Leonie Man, MD      . Tamsulosin HCl [facility]) capsule 0.4 mg  0.4 mg Oral Daily Doreene Burke [place], [place]   0.4 mg at 06/08/12 0940  . traMADol (ULTRAM) tablet 50 mg  50 mg Oral Q6H PRN Erlene Quan, PA      . zolpidem (AMBIEN) tablet 5 mg  5 mg Oral QHS PRN Erlene Quan, PA   5 mg at 06/08/12 2156  . DISCONTD: antiseptic oral rinse (BIOTENE) solution 15 mL  15 mL Mouth Rinse BID Erlene Quan, PA   15 mL at 06/08/12 0800    PE: General appearance: alert, cooperative and no distress Lungs: clear to auscultation bilaterally Heart: Regular rhythm.  Bradicardic 3-4/6 crescendo/decrescendo SEM at the RUSB radiating to carotids. Slow and delayed carotid upstroke consistent with severe AS Extremities: No LEE Pulses: Radials and  DPs 2+ Skin:  Warm and dry.  4-5 cm skin tear on the left, medial, anterior, arm. dressing intact -- stopped bleeding early this AM with pressure bandage. Neurologic:  Alert and oriented.  CN II-XII grossly intact  Lab Results:   Basename 06/09/12 0505 06/08/12 2212 06/08/12 1155 06/08/12 0450 06/07/12 0340  WBC 8.7 -- -- 7.4 13.1*  HGB 9.9* 9.8* 10.7* -- --  HCT 29.5* 28.8* 32.0* -- --  PLT 201 -- -- 193 203   BMET  Basename 06/07/12 0326  NA 137  K 3.7  CL 102  CO2 23  GLUCOSE 137*  BUN 46*  CREATININE 3.20*  CALCIUM 9.0    Assessment/Plan  Principal Problem:  *NSTEMI (non-ST elevated myocardial infarction) Active Problems:  CAD, patent stent to LAD and mid RCA with occluded OM2 branch  05/18/12,plan for CABG/AVR  AS (aortic stenosis), critical stenosis  Acute on chronic renal insufficiency  PVD, with hx of bilateral CEA at [facility], and previous bilateral carotid stenting, with high grade ISR of Rt. carotid 05/18/12  HTN (hypertension)  Dyslipidemia  Diabetes mellitus  Chronic anemia  Acute pulmonary edema  Pending CTS eval for AVR/CABG.  Needs Carotid stenting at some point.   Troponin Peak 2.49 continues to trend down. Stable but elevated SCr --recheck tomorrow.   Arm wound cleaned and dressed by RN.   Over 3 L diuresis last night.   LOS: 3 days   PLAN:  Await evaluation for high risk AVR a/CABG and will have to make a decision re: time of carotid intervention.  Lead to recontact cardiac surgery.  May need to consider stabilization with aortic valvuloplasty to allow for carotid stenting if CABG/aVR deemed too risky with existing carotid stenosis.  We will back off on diuretics for now given the extensive diuresis to avoid hypovolemia.  Cr increased yesterday.  On good dose of beta blocker with heart rates in the 50s to low 60s  Currently on IV heparin for possible ACS, to stop now to avoid further bleeding  Blood sugars are well-controlled on current  diabetes management  Will need some additional BP control beyond NTG paste & low dose BB, avoiding hypotension and hypovolemia with aortic stenosis.  Was on Hydralazine @ home -- will restart.  Leonie Man, M.D., M.S. [facility] [address]. [place], [place]  [address]  [phone] Pager # [phone] 06/09/2012 8:43 AM

## 2012-06-09 NOTE — Progress Notes (Signed)
ANTICOAGULATION CONSULT NOTE - Follow Up Consult  Pharmacy Consult for Heparin Indication: chest pain/ACS  No Known Allergies  Patient Measurements: Height: 5' 5.5" (166.4 cm) Weight: 142 lb 6.7 oz (64.6 kg) IBW/kg (Calculated) : 62.65  Heparin Dosing Weight: 66kg  Vital Signs: Temp: 98.1 F (36.7 C) (10/26 0347) Temp src: Oral (10/26 0347) BP: 154/57 mmHg (10/26 0400) Pulse Rate: 57  (10/26 0400)  Labs:  Basename 06/09/12 0505 06/08/12 2212 06/08/12 1155 06/08/12 1052 06/08/12 0450 06/07/12 1025 06/07/12 0340 06/07/12 0326 06/06/12 1857  HGB 9.9* 9.8* -- -- -- -- -- -- --  HCT 29.5* 28.8* 32.0* -- -- -- -- -- --  PLT 201 -- -- -- 193 -- 203 -- --  APTT -- -- -- -- -- -- -- -- --  LABPROT -- -- -- -- -- -- -- -- --  INR -- -- -- -- -- -- -- -- --  HEPARINUNFRC 0.18* -- -- 0.41 -- -- -- 0.31 --  CREATININE -- -- -- -- -- -- -- 3.20* --  CKTOTAL -- -- -- -- -- -- -- -- --  CKMB -- -- -- -- -- -- -- -- --  TROPONINI -- -- -- -- -- 1.87* -- -- 2.49*    Estimated Creatinine Clearance: 15.5 ml/min (by C-G formula based on Cr of 3.2).   Medications:  Heparin @ 1050 units/hr  Assessment: Heparin level subtherapeutic with no bleeding. H/h/plt stable.   Goal of Therapy:  Heparin level 0.3-0.7 units/ml Monitor platelets by anticoagulation protocol: Yes   Plan:  Increase heparin to 1200 units/hr and recheck in 8 hours.  Curlene Dolphin 06/09/2012,6:23 AM

## 2012-06-10 LAB — COMPREHENSIVE METABOLIC PANEL
Albumin: 2.8 g/dL — ABNORMAL LOW (ref 3.5–5.2)
BUN: 51 mg/dL — ABNORMAL HIGH (ref 6–23)
Chloride: 100 mEq/L (ref 96–112)
Creatinine, Ser: 2.94 mg/dL — ABNORMAL HIGH (ref 0.50–1.35)
GFR calc Af Amer: 21 mL/min — ABNORMAL LOW (ref >90)
GFR calc non Af Amer: 18 mL/min — ABNORMAL LOW (ref >90)
Glucose, Bld: 96 mg/dL (ref 70–99)
Total Bilirubin: 0.4 mg/dL (ref 0.3–1.2)

## 2012-06-10 LAB — GLUCOSE, CAPILLARY: Glucose-Capillary: 127 mg/dL — ABNORMAL HIGH (ref 70–99)

## 2012-06-10 LAB — CBC
MCV: 89.8 fL (ref 78.0–100.0)
Platelets: 240 10*3/uL (ref 150–400)
RBC: 3.33 MIL/uL — ABNORMAL LOW (ref 4.22–5.81)
WBC: 8.3 10*3/uL (ref 4.0–10.5)

## 2012-06-10 LAB — PRO B NATRIURETIC PEPTIDE: Pro B Natriuretic peptide (BNP): 7288 pg/mL — ABNORMAL HIGH (ref 0–450)

## 2012-06-10 NOTE — Progress Notes (Signed)
[facility]  Subjective: Doing well.   The pain in his left side only hurts with sneezing or coughing.  Objective: Vital signs in last 24 hours: Temp:  [97.4 F (36.3 C)-99.1 F (37.3 C)] 98.8 F (37.1 C) (10/27 0743) Pulse Rate:  [50-58] 50  (10/26 1529) Resp:  [18-20] 18  (10/27 0743) BP: (108-161)/(50-77) 147/61 mmHg (10/27 0357) SpO2:  [91 %-98 %] 98 % (10/27 0743) Weight:  [63.2 kg (139 lb 5.3 oz)] 63.2 kg (139 lb 5.3 oz) (10/27 0500) Last BM Date: 06/09/12  Intake/Output from previous day: 10/26 0701 - 10/27 0700 In: 914 [P.O.:870; I.V.:44] Out: 2400 [Urine:2400] Intake/Output this shift:    Medications Current Facility-Administered Medications  Medication Dose Route Frequency Provider Last Rate Last Dose  . 0.9 %  sodium chloride infusion   Intravenous Continuous Doreene Burke [place], PA      . 0.9 %  sodium chloride infusion   Intravenous Continuous Erlene Quan, [place]      . acetaminophen (TYLENOL) tablet 650 mg  650 mg Oral Q4H PRN Erlene Quan, PA      . ALPRAZolam Duanne Moron) tablet 0.25 mg  0.25 mg Oral BID PRN Erlene Quan, PA   0.25 mg at 06/09/12 2323  . aspirin EC tablet 81 mg  81 mg Oral Daily Doreene Burke [place], [place]   81 mg at 06/09/12 1106  . atenolol (TENORMIN) tablet 25 mg  25 mg Oral Daily Doreene Burke [place], [place]   25 mg at 06/09/12 1107  . atorvastatin (LIPITOR) tablet 40 mg  40 mg Oral QHS Doreene Burke [place], [place]   40 mg at 06/09/12 2203  . darifenacin (ENABLEX) 24 hr tablet 7.5 mg  7.5 mg Oral Daily Doreene Burke [place], [place]   7.5 mg at 06/09/12 1107  . ferrous fumarate (HEMOCYTE - 106 mg FE) tablet 106 mg of iron  106 mg of iron Oral BID Erlene Quan, PA   106 mg of iron at 06/09/12 2203  . furosemide (LASIX) tablet 40 mg  40 mg Oral Daily Leonie Man, MD      . glipiZIDE (GLUCOTROL) tablet 10 mg  10 mg Oral Q breakfast Doreene Burke [place], [place]   10 mg at 06/09/12 0901  . hydrALAZINE (APRESOLINE) tablet 25 mg  25 mg Oral Q8H Leonie Man, MD   25 mg at  06/10/12 0618  . insulin aspart (novoLOG) injection 0-5 Units  0-5 Units Subcutaneous QHS Doreene Burke [place], [place]   2 Units at 06/09/12 2206  . insulin aspart (novoLOG) injection 0-9 Units  0-9 Units Subcutaneous TID WC Doreene Burke [place], PA   1 Units at 06/09/12 1811  . morphine 2 MG/ML injection 2 mg  2 mg Intravenous Q2H PRN Erlene Quan, PA      . multivitamin with minerals tablet 1 tablet  1 tablet Oral Daily Troy Sine, MD   1 tablet at 06/09/12 1107  . nitroGLYCERIN (NITROGLYN) 2 % ointment 0.5 inch  0.5 inch Topical Q8H Erlene Quan, PA   0.5 inch at 06/10/12 0618  . nitroGLYCERIN (NITROSTAT) SL tablet 0.4 mg  0.4 mg Sublingual Q5 Min x 3 PRN Erlene Quan, PA      . omega-3 acid ethyl esters (LOVAZA) capsule 1 g  1 g Oral TID Troy Sine, MD   1 g at 06/09/12 2203  . ondansetron (ZOFRAN) injection 4 mg  4 mg Intravenous Q6H PRN Lurena Joiner  K Kilroy, PA      . pantoprazole (PROTONIX) EC tablet 40 mg  40 mg Oral Daily Doreene Burke [place], [place]   40 mg at 06/09/12 1108  . sodium chloride (OCEAN) 0.65 % nasal spray 1 spray  1 spray Each Nare PRN Leonie Man, MD      . Tamsulosin HCl [facility]) capsule 0.4 mg  0.4 mg Oral Daily Doreene Burke [place], [place]   0.4 mg at 06/09/12 1107  . traMADol (ULTRAM) tablet 50 mg  50 mg Oral Q6H PRN Erlene Quan, PA      . zolpidem (AMBIEN) tablet 5 mg  5 mg Oral QHS PRN Erlene Quan, PA   5 mg at 06/08/12 2156  . DISCONTD: furosemide (LASIX) tablet 80 mg  80 mg Oral Daily Doreene Burke [place], [place]   80 mg at 06/08/12 0941  . DISCONTD: heparin ADULT infusion 100 units/mL (25000 units/250 mL)  1,200 Units/hr Intravenous Continuous Troy Sine, MD   1,200 Units/hr at 06/09/12 [id number]    PE: General appearance: alert, cooperative and no distress Lungs: clear to auscultation bilaterally Heart: regular rate and rhythm and 3/6 sys mm Extremities: No LEE Pulses: 2+ and symmetric Skin: Ecchymosis more apparent on the left posterior thorasic area today. Neurologic: Grossly normal  Lab  Results:   Basename 06/10/12 0555 06/09/12 0505 06/08/12 2212 06/08/12 0450  WBC 8.3 8.7 -- 7.4  HGB 10.3* 9.9* 9.8* --  HCT 29.9* 29.5* 28.8* --  PLT 240 201 -- 193   BMET  Basename 06/10/12 0555  NA 135  K 4.2  CL 100  CO2 23  GLUCOSE 96  BUN 51*  CREATININE 2.94*  CALCIUM 9.2   LEFT RIBS AND CHEST - 3+ VIEW  Comparison: Chest radiograph 06/06/2012  Findings: There are minimally displaced fractures of the left  lateral eighth, ninth, and tenth ribs. No evidence of pneumothorax.  Vascular stent noted on the right.  IMPRESSION:  Several minimally displaced left infralateral rib fractures.  Assessment/Plan   Principal Problem:  *NSTEMI (non-ST elevated myocardial infarction) Active Problems:  CAD, patent stent to LAD and mid RCA with occluded OM2 branch  05/18/12,plan for CABG/AVR  AS (aortic stenosis), critical stenosis  Acute on chronic renal insufficiency  PVD, with hx of bilateral CEA at [facility], and previous bilateral carotid stenting, with high grade ISR of Rt. carotid 05/18/12  HTN (hypertension)  Dyslipidemia  Diabetes mellitus  Chronic anemia  Acute pulmonary edema  Plan:  Patient had fallen yesterday and xray shows several minimally displaced left infralateral rib fractures.  The patinet would like to know if this will delay surgery.  CABG/AVR.  Hgb stable.  Net fluids -4.7 L in the last 48hrs.  Rate of output has slowed.  Getting Lasix 40mg  PO daily currently.  Will ambulate in hall with RN today.   LOS: 4 days    HAGER, BRYAN 06/10/2012 7:59 AM  I have seen & examined the patient along with Mr. Samara Snide, [place].  I agree with his findings, exam & recommendations.   Results of rib films noted -- explains the level of pain.   CTSurgery is aware of his admission -- anticipate Dr. Servando Snare to see (hopefully) Monday to determine COA.    Renal function stable & "improving" & continues to have brisk UOP on PO lasix.   Will allow BP to remain in current ~126mmHg  level to allow renal perfusion & avoid potential for hypotension.  Will allow freedom to move about  in the hall either walking or with wheelchair.  Leonie Man, M.D., M.S. [facility] [address]. [place], [place]  [address]  [phone] Pager # [phone] 06/10/2012 9:04 AM

## 2012-06-11 ENCOUNTER — Inpatient Hospital Stay (HOSPITAL_COMMUNITY): Payer: PRIVATE HEALTH INSURANCE

## 2012-06-11 ENCOUNTER — Other Ambulatory Visit: Payer: Self-pay | Admitting: *Deleted

## 2012-06-11 DIAGNOSIS — I339 Acute and subacute endocarditis, unspecified: Secondary | ICD-10-CM

## 2012-06-11 DIAGNOSIS — I2510 Atherosclerotic heart disease of native coronary artery without angina pectoris: Secondary | ICD-10-CM

## 2012-06-11 DIAGNOSIS — S2249XA Multiple fractures of ribs, unspecified side, initial encounter for closed fracture: Secondary | ICD-10-CM | POA: Insufficient documentation

## 2012-06-11 DIAGNOSIS — S2239XA Fracture of one rib, unspecified side, initial encounter for closed fracture: Secondary | ICD-10-CM | POA: Insufficient documentation

## 2012-06-11 DIAGNOSIS — I359 Nonrheumatic aortic valve disorder, unspecified: Secondary | ICD-10-CM

## 2012-06-11 DIAGNOSIS — I39 Endocarditis and heart valve disorders in diseases classified elsewhere: Secondary | ICD-10-CM

## 2012-06-11 DIAGNOSIS — I6529 Occlusion and stenosis of unspecified carotid artery: Secondary | ICD-10-CM

## 2012-06-11 DIAGNOSIS — N184 Chronic kidney disease, stage 4 (severe): Secondary | ICD-10-CM | POA: Diagnosis present

## 2012-06-11 LAB — PULMONARY FUNCTION TEST

## 2012-06-11 LAB — URINALYSIS, ROUTINE W REFLEX MICROSCOPIC
Glucose, UA: NEGATIVE mg/dL
Leukocytes, UA: NEGATIVE
Nitrite: NEGATIVE
Protein, ur: NEGATIVE mg/dL
Urobilinogen, UA: 0.2 mg/dL (ref 0.0–1.0)

## 2012-06-11 LAB — GLUCOSE, CAPILLARY
Glucose-Capillary: 161 mg/dL — ABNORMAL HIGH (ref 70–99)
Glucose-Capillary: 244 mg/dL — ABNORMAL HIGH (ref 70–99)
Glucose-Capillary: 211 mg/dL — ABNORMAL HIGH (ref 70–99)

## 2012-06-11 MED ORDER — DARBEPOETIN ALFA-POLYSORBATE 100 MCG/0.5ML IJ SOLN
100.0000 ug | INTRAMUSCULAR | Status: DC
  Administered 2012-06-12: 100 ug via SUBCUTANEOUS
  Filled 2012-06-11 (×2): qty 0.5

## 2012-06-11 MED ORDER — ALBUTEROL SULFATE (5 MG/ML) 0.5% IN NEBU
2.5000 mg | INHALATION_SOLUTION | Freq: Once | RESPIRATORY_TRACT | Status: AC
  Administered 2012-06-11: 2.5 mg via RESPIRATORY_TRACT

## 2012-06-11 MED ORDER — ISOSORBIDE MONONITRATE ER 30 MG PO TB24
30.0000 mg | ORAL_TABLET | Freq: Every day | ORAL | Status: DC
  Administered 2012-06-11 – 2012-06-12 (×2): 30 mg via ORAL
  Filled 2012-06-11 (×3): qty 1

## 2012-06-11 MED ORDER — FUROSEMIDE 20 MG PO TABS
20.0000 mg | ORAL_TABLET | Freq: Every day | ORAL | Status: DC
  Administered 2012-06-11: 20 mg via ORAL
  Filled 2012-06-11 (×2): qty 1

## 2012-06-11 MED ORDER — LIDOCAINE 5 % EX PTCH
1.0000 | MEDICATED_PATCH | CUTANEOUS | Status: DC
  Administered 2012-06-11 – 2012-06-12 (×2): 1 via TRANSDERMAL
  Filled 2012-06-11 (×3): qty 1

## 2012-06-11 NOTE — Consult Note (Signed)
Reason for Consult:CKD 4 Referring Physician: Dr Collin Pearson is an 78 y.o. male.  HPI: 77 yr male with hx Cr 2.8-3.5, followed by Dr. Posey Pronto and being prepared for ESRD mgmt.  Renal dz from HTN/Ischemia.  Also hx DM .  Admitted on 10/23 after prolonged CP and SOB, found to have nonSTEMI.  Has instent stenosis of carotid stent and tight AS, needing procedures.  Now out of CHF after diuresis and we are asked to eval and follow. ROS Recent PND and orthop, DOE. This was with his CP and now resolved Ankle edema, Nocturia x 5 No cough, fever or chills, No hx asthma No N,V,D No rash or itching No HA or visual or hearing prob No joint pains or hx Gout No syncope or hx sz   Primary Nephrologist Dr Posey Pronto.   Past Medical History  Diagnosis Date  . Clotting disorder   . Coronary artery disease   . AS (aortic stenosis)   . End stage renal disease   . Anginal pain   . Hypertension   . Diabetes mellitus   . Peripheral vascular disease   . GERD (gastroesophageal reflux disease)   . CKD (chronic kidney disease) stage 4, GFR 15-29 ml/min   . PVD (peripheral vascular disease), with hsx of bilateral carotid endarterectomies at [facility], and bilateral carotid stenting 05/18/2012  . Angina effort 05/18/2012  . Shortness of breath   . Heart murmur     Past Surgical History  Procedure Date  . Endarterectomy   . Prostate surgery   . Cardiac stents   . Cardiac catheterization     History reviewed. No pertinent family history.  Social History:  reports that he has never smoked. He has never used smokeless tobacco. He reports that he does not drink alcohol or use illicit drugs.  Allergies: No Known Allergies  Medications:  I have reviewed the patient's current medications. Prior to Admission:     Results for orders placed during the hospital encounter of 06/06/12 (from the past 48 hour(s))  GLUCOSE, CAPILLARY     Status: Abnormal   Collection Time   06/09/12  4:31 PM   Component Value Range Comment   Glucose-Capillary 131 (*) 70 - 99 mg/dL   GLUCOSE, CAPILLARY     Status: Abnormal   Collection Time   06/09/12  9:24 PM      Component Value Range Comment   Glucose-Capillary 228 (*) 70 - 99 mg/dL   COMPREHENSIVE METABOLIC PANEL     Status: Abnormal   Collection Time   06/10/12  5:55 AM      Component Value Range Comment   Sodium 135  135 - 145 mEq/L    Potassium 4.2  3.5 - 5.1 mEq/L    Chloride 100  96 - 112 mEq/L    CO2 23  19 - 32 mEq/L    Glucose, Bld 96  70 - 99 mg/dL    BUN 51 (*) 6 - 23 mg/dL    Creatinine, Ser 2.94 (*) 0.50 - 1.35 mg/dL    Calcium 9.2  8.4 - 10.5 mg/dL    Total Protein 6.6  6.0 - 8.3 g/dL    Albumin 2.8 (*) 3.5 - 5.2 g/dL    AST 47 (*) 0 - 37 U/L    ALT 28  0 - 53 U/L    Alkaline Phosphatase 41  39 - 117 U/L    Total Bilirubin 0.4  0.3 - 1.2 mg/dL  GFR calc non Af Amer 18 (*) >90 mL/min    GFR calc Af Amer 21 (*) >90 mL/min   CBC     Status: Abnormal   Collection Time   06/10/12  5:55 AM      Component Value Range Comment   WBC 8.3  4.0 - 10.5 K/uL    RBC 3.33 (*) 4.22 - 5.81 MIL/uL    Hemoglobin 10.3 (*) 13.0 - 17.0 g/dL    HCT 29.9 (*) 39.0 - 52.0 %    MCV 89.8  78.0 - 100.0 fL    MCH 30.9  26.0 - 34.0 pg    MCHC 34.4  30.0 - 36.0 g/dL    RDW 14.7  11.5 - 15.5 %    Platelets 240  150 - 400 K/uL   PRO B NATRIURETIC PEPTIDE     Status: Abnormal   Collection Time   06/10/12  5:55 AM      Component Value Range Comment   Pro B Natriuretic peptide (BNP) 7288.0 (*) 0 - 450 pg/mL   GLUCOSE, CAPILLARY     Status: Abnormal   Collection Time   06/10/12  7:44 AM      Component Value Range Comment   Glucose-Capillary 127 (*) 70 - 99 mg/dL    Comment 1 Notify RN     GLUCOSE, CAPILLARY     Status: Abnormal   Collection Time   06/10/12 11:42 AM      Component Value Range Comment   Glucose-Capillary 239 (*) 70 - 99 mg/dL    Comment 1 Notify RN     GLUCOSE, CAPILLARY     Status: Abnormal   Collection Time   06/10/12   4:37 PM      Component Value Range Comment   Glucose-Capillary 184 (*) 70 - 99 mg/dL    Comment 1 Notify RN     GLUCOSE, CAPILLARY     Status: Abnormal   Collection Time   06/10/12  9:39 PM      Component Value Range Comment   Glucose-Capillary 217 (*) 70 - 99 mg/dL   GLUCOSE, CAPILLARY     Status: Abnormal   Collection Time   06/11/12  7:42 AM      Component Value Range Comment   Glucose-Capillary 161 (*) 70 - 99 mg/dL    Comment 1 Notify RN     GLUCOSE, CAPILLARY     Status: Abnormal   Collection Time   06/11/12 11:44 AM      Component Value Range Comment   Glucose-Capillary 244 (*) 70 - 99 mg/dL    Comment 1 Notify RN       No results found.  @ROS @ Blood pressure 131/42, pulse 50, temperature 97.3 F (36.3 C), temperature source Oral, resp. rate 18, height 5' 5.5" (1.664 m), weight 62.6 kg (138 lb 0.1 oz), SpO2 97.00%. @PHYSEXAMBYAGE2 @ Physical Examination: General appearance - pale, elderly, frail Mental status - alert, oriented to person, place, and time Eyes - pupils equal and reactive, extraocular eye movements intact Mouth - dental hygiene poor and atrophic mucosa Neck - adenopathy noted PCL, bilat carotid bruits Lymphatics - posterior cervical nodes Chest - decreased air entry noted bialt Heart - S1 and S2 normal, systolic murmur [id number] at 2nd left intercostal space, diastolic murmur [id number] at 2nd left intercostal space Abdomen - pos bs,liver down 4 cm Musculoskeletal - no joint tenderness, deformity or swelling Extremities - no pedal edema noted, decreased DP Skin - pale, trophic changes distal  Assessment/Plan: 1 CKD 4-5 evolving ESRD, needs access and assessment of complications 2 AS will be high risk oveall and esp with renal status, for ESRD 3 Hypertension: not an issue 4. Anemia assess and use epo 5. Metabolic Bone Disease: Pth P 6 Carotid per cards 7 Dental caries needs addressed P epo,check fe, check PTH, access  Collin Pearson L 06/11/2012, 4:04  PM

## 2012-06-11 NOTE — Progress Notes (Signed)
[facility]  Subjective: Pain in ribs with sneeze, and nose blowing.  Objective: Vital signs in last 24 hours: Temp:  [97.4 F (36.3 C)-98.3 F (36.8 C)] 97.5 F (36.4 C) (10/28 0739) Resp:  [16-18] 18  (10/28 0415) BP: (116-163)/(45-56) 145/56 mmHg (10/28 0645) SpO2:  [94 %-98 %] 94 % (10/28 0739) Weight:  [62.6 kg (138 lb 0.1 oz)] 62.6 kg (138 lb 0.1 oz) (10/28 0235) Last BM Date: 06/10/12  Intake/Output from previous day: 10/27 0701 - 10/28 0700 In: 840 [P.O.:840] Out: 1500 [Urine:1500] Intake/Output this shift: Total I/O In: -  Out: 150 [Urine:150]  Medications Current Facility-Administered Medications  Medication Dose Route Frequency Provider Last Rate Last Dose  . 0.9 %  sodium chloride infusion   Intravenous Continuous Doreene Burke [place], PA      . 0.9 %  sodium chloride infusion   Intravenous Continuous Erlene Quan, [place]      . acetaminophen (TYLENOL) tablet 650 mg  650 mg Oral Q4H PRN Erlene Quan, PA   650 mg at 06/10/12 1022  . ALPRAZolam Duanne Moron) tablet 0.25 mg  0.25 mg Oral BID PRN Erlene Quan, PA   0.25 mg at 06/10/12 2150  . aspirin EC tablet 81 mg  81 mg Oral Daily Doreene Burke [place], [place]   81 mg at 06/10/12 1022  . atenolol (TENORMIN) tablet 25 mg  25 mg Oral Daily Doreene Burke [place], [place]   25 mg at 06/10/12 1022  . atorvastatin (LIPITOR) tablet 40 mg  40 mg Oral QHS Doreene Burke [place], [place]   40 mg at 06/10/12 2150  . darifenacin (ENABLEX) 24 hr tablet 7.5 mg  7.5 mg Oral Daily Erlene Quan, PA   7.5 mg at 06/10/12 1022  . ferrous fumarate (HEMOCYTE - 106 mg FE) tablet 106 mg of iron  106 mg of iron Oral BID Erlene Quan, PA   106 mg of iron at 06/10/12 2150  . furosemide (LASIX) tablet 40 mg  40 mg Oral Daily Leonie Man, MD   40 mg at 06/10/12 1022  . glipiZIDE (GLUCOTROL) tablet 10 mg  10 mg Oral Q breakfast Doreene Burke [place], [place]   10 mg at 06/11/12 0804  . hydrALAZINE (APRESOLINE) tablet 25 mg  25 mg Oral Q8H Leonie Man, MD   25 mg at  06/11/12 0645  . insulin aspart (novoLOG) injection 0-5 Units  0-5 Units Subcutaneous QHS Doreene Burke [place], [place]   2 Units at 06/10/12 2150  . insulin aspart (novoLOG) injection 0-9 Units  0-9 Units Subcutaneous TID WC Doreene Burke [place], PA   1 Units at 06/11/12 0804  . morphine 2 MG/ML injection 2 mg  2 mg Intravenous Q2H PRN Erlene Quan, PA      . multivitamin with minerals tablet 1 tablet  1 tablet Oral Daily Troy Sine, MD   1 tablet at 06/10/12 1022  . nitroGLYCERIN (NITROGLYN) 2 % ointment 0.5 inch  0.5 inch Topical Q8H Erlene Quan, PA   0.5 inch at 06/11/12 0645  . nitroGLYCERIN (NITROSTAT) SL tablet 0.4 mg  0.4 mg Sublingual Q5 Min x 3 PRN Erlene Quan, PA      . omega-3 acid ethyl esters (LOVAZA) capsule 1 g  1 g Oral TID Troy Sine, MD   1 g at 06/10/12 2150  . ondansetron (ZOFRAN) injection 4 mg  4 mg Intravenous Q6H PRN Erlene Quan, [place]      .  pantoprazole (PROTONIX) EC tablet 40 mg  40 mg Oral Daily Doreene Burke [place], [place]   40 mg at 06/10/12 1022  . sodium chloride (OCEAN) 0.65 % nasal spray 1 spray  1 spray Each Nare PRN Leonie Man, MD      . Tamsulosin HCl [facility]) capsule 0.4 mg  0.4 mg Oral Daily Doreene Burke [place], [place]   0.4 mg at 06/10/12 1022  . traMADol (ULTRAM) tablet 50 mg  50 mg Oral Q6H PRN Erlene Quan, PA   50 mg at 06/11/12 0406  . zolpidem (AMBIEN) tablet 5 mg  5 mg Oral QHS PRN Erlene Quan, PA   5 mg at 06/08/12 2156    PE: General appearance: alert, cooperative and no distress  Lungs: clear to auscultation bilaterally  Heart: regular rate and rhythm and 3/6 sys mm  Extremities: No LEE  Pulses: 2+ and symmetric  Skin: Ecchymosis more apparent on the left posterior thorasic area today.  Neurologic: Grossly normal   Lab Results:   Basename 06/10/12 0555 06/09/12 0505 06/08/12 2212  WBC 8.3 8.7 --  HGB 10.3* 9.9* 9.8*  HCT 29.9* 29.5* 28.8*  PLT 240 201 --   BMET  Basename 06/10/12 0555  NA 135  K 4.2  CL 100  CO2 23  GLUCOSE 96  BUN 51*    CREATININE 2.94*  CALCIUM 9.2    Assessment/Plan  Principal Problem:  *NSTEMI (non-ST elevated myocardial infarction) Active Problems:  AS (aortic stenosis), critical stenosis  Rib fractures, several, left side   CAD, patent stent to LAD and mid RCA with occluded OM2 branch  05/18/12,plan for CABG/AVR  Acute on chronic renal insufficiency  PVD, with hx of bilateral CEA at [facility], and previous bilateral carotid stenting, with high grade ISR of Rt. carotid 05/18/12  HTN (hypertension)  Dyslipidemia  Diabetes mellitus  Chronic anemia  Acute pulmonary edema  Plan:  Several minimally displaced left infralateral rib fractures from fall on Friday. The patient would like to know if this will delay surgery. CABG/AVR. Hgb stable. Net fluids -2.0 L in the last 48hrs. Rate of output has slowed. Getting Lasix 40mg  PO daily currently. Will change to 20mg  daily.  Adding Imdur 30mg  and DCing nitro paste.  Ambulated in hall with RN yesterday and did well.   LOS: 5 days    HAGER, BRYAN 06/11/2012 8:50 AM  I saw & examined the patient this AM along with Mr. Samara Snide (was unable to consign the note at that time).  I agree with his findings, examination, & recommendations.  Continues to progress well -- still net Negative -- can back off on lasix today. HR currently in high 40s but went up appropriately with ambulation yesterday & no CP/SOB.  Convert to PO nitrate  Have consulted Vasc Sgx re Carotid stent (since Dr. Gwenlyn Found is out of town this week)  Talked with Dr. Servando Snare, who will see pt today & asked for Nephrology to be consulted as well. He seems to have stabilized from a cardiac standpoint.  Will try to shoot for "net even" I/O Pain from rib Fxr from falls is bearable -- will try Lidoderm patch.  Arm wound looks better as well.  As it stands we are still trying to decide the appropriate COA. Options are complex, including:  Ao Valvuloplasty --> Carotid stent --> AVR/CABG after 1 month  Plavix  TAVI/PCI --> Carotid Stent (would require TAVI eval @ [facility] or [facility] & will delay Rx)  CABG/AVR followed by  Carotid stenting (defer to Vasc Sgx for assessment of carotid disease severity)  Appreciate input from all consultants.  Leonie Man, M.D., M.S. [facility] [address] [place], [place]  [address]  [phone] Pager # [phone] 06/11/2012 11:21 AM

## 2012-06-11 NOTE — Progress Notes (Signed)
[address]            [place] [address]          [phone]        Anthonee H Hockey [place] Medical Record [id number] Date of Birth: [birth year]  Referring: No ref. provider found Primary Care: CAMPBELL,STEPHEN, MD Renal: Dr Posey Pronto  Chief Complaint:    Chest Pain, SOB   History of Present Illness:    Patient is an 78 year old male with long history of critical aortic stenosis recent echocardiogram showed a velocity across his aortic valve of 477 cm/s. He has long-standing history of cardiovascular disease having had carotid endarterectomies bilaterally 25 years ago and stents placed in both carotid arteries, he has known coronary occlusive disease with multiple coronary stents. He also has stage IV renal disease. He comes in today to discuss the risks and options of aortic valve replacement in the setting of diffuse vascular disease and end-stage renal disease that likely will result in dialysis.  The patient previously had no  cardiac symptoms at rest or light activity around the house. He was digging holes in the yard several weeks ago  wishes without chest pain. He does note though with much exertion he'll have chest discomfort with relief quickly when he stops. The day before admission hed had episode of chest discomfort and sob while in tub and could not get out. He has had no syncope. Since admission he fell in room while on heparin and bruised left arm and broke several ribs    Current Activity/ Functional Status: Patient is independent with mobility/ambulation, transfers, ADL's, IADL's.   Past Medical History  Diagnosis Date  . Clotting disorder   . Coronary artery disease   . AS (aortic stenosis)   . End stage renal disease   . Anginal pain   . Hypertension   . Diabetes mellitus   . Peripheral vascular disease   . GERD (gastroesophageal reflux disease)   . CKD (chronic kidney disease) stage 4, GFR 15-29 ml/min     . PVD (peripheral vascular disease), with hsx of bilateral carotid endarterectomies at [facility], and bilateral carotid stenting 05/18/2012  . Angina effort 05/18/2012  . Shortness of breath   . Heart murmur      past surgical history: Bilateral carotid endarterectomies 25 years ago, followed by bilateral carotid stents approximately 10 years ago    family history : Patient's father died of a myocardial infarction age 89 mother died of stroke at age 7, his brother died at age 50 after having a pig valve aortic replacement 20 years prior  History   Social History  . Marital Status: Married    Spouse Name: N/A    Number of Children: N/A  . Years of Education: N/A   Occupational History  .  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 Smoking status: Never Smoker   . Smokeless tobacco: Never Used  . Alcohol Use: No  . Drug Use: Not on file  . Sexually Active: Not on file              History  Smoking status  . Never Smoker   Smokeless tobacco  . Never Used    History  Alcohol Use: none     No Known Allergies  Current Facility-Administered Medications  Medication Dose Route Frequency Provider Last Rate Last Dose  . 0.9 %  sodium chloride infusion   Intravenous Continuous Doreene Burke [place], PA      . 0.9 %  sodium chloride infusion   Intravenous Continuous Erlene Quan, [place]      . acetaminophen (TYLENOL) tablet 650 mg  650 mg Oral Q4H PRN Erlene Quan, PA   650 mg at 06/10/12 1022  . albuterol (PROVENTIL) (5 MG/ML) 0.5% nebulizer solution 2.5 mg  2.5 mg Nebulization Once Grace Isaac, MD   2.5 mg at 06/11/12 1446  . ALPRAZolam Duanne Moron) tablet 0.25 mg  0.25 mg Oral BID PRN Erlene Quan, PA   0.25 mg at 06/10/12 2150  . aspirin EC tablet 81 mg  81 mg Oral Daily Doreene Burke [place], [place]   81 mg at 06/11/12 1009  . atenolol  (TENORMIN) tablet 25 mg  25 mg Oral Daily Doreene Burke [place], [place]   25 mg at 06/11/12 1322  . atorvastatin (LIPITOR) tablet 40 mg  40 mg Oral QHS Doreene Burke [place], [place]   40 mg at 06/10/12 2150  . darbepoetin (ARANESP) injection 100 mcg  100 mcg Subcutaneous Q Tue-1800 Joyice Faster [place], MD      . darifenacin (ENABLEX) 24 hr tablet 7.5 mg  7.5 mg Oral Daily Doreene Burke [place], PA   7.5 mg at 06/11/12 1009  . ferrous fumarate (HEMOCYTE - 106 mg FE) tablet 106 mg of iron  106 mg of iron Oral BID Erlene Quan, PA   106 mg of iron at 06/11/12 1007  . furosemide (LASIX) tablet 20 mg  20 mg Oral Daily Tarri Fuller, PA   20 mg at 06/11/12 1008  . glipiZIDE (GLUCOTROL) tablet 10 mg  10 mg Oral Q breakfast Doreene Burke [place], [place]   10 mg at 06/11/12 0804  . hydrALAZINE (APRESOLINE) tablet 25 mg  25 mg Oral Q8H Leonie Man, MD   25 mg at 06/11/12 1322  . insulin aspart (novoLOG) injection 0-5 Units  0-5 Units Subcutaneous QHS Doreene Burke [place], [place]   2 Units at 06/10/12 2150  . insulin aspart (novoLOG) injection 0-9 Units  0-9 Units Subcutaneous TID WC Doreene Burke [place], PA   3 Units at 06/11/12 1639  . isosorbide mononitrate (IMDUR) 24 hr tablet 30 mg  30 mg Oral Daily Tarri Fuller, PA   30 mg at 06/11/12 1100  . lidocaine (LIDODERM) 5 % 1 patch  1 patch Transdermal Q24H Leonie Man, MD   1 patch at 06/11/12 1200  . morphine 2 MG/ML injection 2 mg  2 mg Intravenous Q2H PRN Erlene Quan, PA      . multivitamin with minerals tablet 1 tablet  1 tablet Oral Daily Troy Sine, MD   1 tablet at 06/11/12 1006  . nitroGLYCERIN (NITROSTAT) SL tablet 0.4 mg  0.4 mg Sublingual Q5 Min x 3 PRN Erlene Quan, PA      . omega-3 acid ethyl esters (LOVAZA) capsule 1 g  1 g Oral TID Troy Sine, MD   1 g at 06/11/12 1639  . ondansetron (ZOFRAN) injection 4 mg  4 mg Intravenous Q6H PRN Erlene Quan, PA      . pantoprazole (PROTONIX) EC tablet 40  mg  40 mg Oral Daily Doreene Burke [place], [place]   40 mg at 06/11/12 1006  . sodium chloride (OCEAN) 0.65 %  nasal spray 1 spray  1 spray Each Nare PRN Leonie Man, MD      . Tamsulosin HCl [facility]) capsule 0.4 mg  0.4 mg Oral Daily Doreene Burke [place], [place]   0.4 mg at 06/11/12 1007  . traMADol (ULTRAM) tablet 50 mg  50 mg Oral Q6H PRN Erlene Quan, PA   50 mg at 06/11/12 0406  . zolpidem (AMBIEN) tablet 5 mg  5 mg Oral QHS PRN Erlene Quan, PA   5 mg at 06/08/12 2156  . DISCONTD: furosemide (LASIX) tablet 40 mg  40 mg Oral Daily Leonie Man, MD   40 mg at 06/10/12 1022  . DISCONTD: nitroGLYCERIN (NITROGLYN) 2 % ointment 0.5 inch  0.5 inch Topical Q8H Erlene Quan, [place]   0.5 inch at 06/11/12 0645       Review of Systems:     Cardiac Review of Systems: Y or N  Chest Pain [  y  ]  Resting SOB [n   ] Exertional SOB  [n  ]  Orthopnea [ n ]   Pedal Edema [ n  ]    Palpitations [n  ] Syncope  [n  ]   Presyncope [ n  ]  General Review of Systems: [Y] = yes [  ]=no Constitional: recent weight change [  ]; anorexia [  ]; fatigue Blue.Reese  ]; nausea [ n ]; night sweats [ n ]; fever [ n ]; or chills [  n];                                                                                                                                          Dental: poor dentition[n  ]; Last Dentist visit: 6 months ago  Eye : blurred vision [  ]; diplopia [   ]; vision changes [  ];  Amaurosis fugax[  ]; Resp: cough [  ];  wheezing[  ];  hemoptysis[  ]; shortness of breath[  ]; paroxysmal nocturnal dyspnea[  ]; dyspnea on exertion[  ]; or orthopnea[  ];  GI:  gallstones[  ], vomiting[  ];  dysphagia[  ]; melena[  ];  hematochezia [  ]; heartburn[  ];   Hx of  Colonoscopy[  ]; GU: kidney stones [  ]; hematuria[ ypast ];   dysuria [  ];  nocturia[  ];  history of     obstruction [ n ];             Skin: rash, swelling[  ];, hair loss[  ];  peripheral edema[  ];  or itching[  ]; Musculosketetal: myalgias[  ];  joint swelling[  ];  joint erythema[  ];  joint pain[  ];  back pain[  ];  Heme/Lymph: bruising[  ];  bleeding[  ];   anemia[  ];  Neuro: TIA[  ];  headaches[  ];  stroke[  ];  vertigo[  ];  seizures[  ];   paresthesias[  ];  difficulty walking[  ];  Psych:depression[  ]; anxiety[  ];  Endocrine: diabetes[  ];  thyroid dysfunction[  ];  Immunizations: Flu [ y]; Pneumococcal[ y ];  Other:  Physical Exam: BP 147/54  Pulse 60  Temp 97.8 F (36.6 C) (Oral)  Resp 18  Ht 5' 5.5" (1.664 m)  Wt 138 lb 0.1 oz (62.6 kg)  BMI 22.62 kg/m2  SpO2 95%  General appearance: alert, cooperative, appears stated age, fatigued and no distress Neurologic: intact Heart: regular rate and rhythm and systolic murmur: holosystolic 4/6, crescendo throughout the precordium Lungs: clear to auscultation bilaterally and normal percussion bilaterally Abdomen: soft, non-tender; bowel sounds normal; no masses,  no organomegaly Extremities: extremities normal, atraumatic, no cyanosis or edema and distal puses are not palpable at ankles bil carotid bruits   Diagnostic Studies & Laboratory data:     Recent Radiology Findings:   No results found.  Dg Ribs Unilateral W/chest Left  06/08/2012  *RADIOLOGY REPORT*  Clinical Data: Fall, left flank pain for  LEFT RIBS AND CHEST - 3+ VIEW  Comparison: Chest radiograph 06/06/2012  Findings: There are minimally displaced fractures of the left lateral eighth, ninth, and tenth ribs. No evidence of pneumothorax. Vascular stent noted on the right.  IMPRESSION: Several minimally displaced left infralateral rib fractures.   Original Report Authenticated By: Suzy Bouchard, M.D.     Recent Lab Findings: Lab Results  Component Value Date   WBC 8.3 06/10/2012   HGB 10.3* 06/10/2012   HCT 29.9* 06/10/2012   PLT 240 06/10/2012   GLUCOSE 96 06/10/2012   ALT 28 06/10/2012   AST 47* 06/10/2012   NA 135 06/10/2012   K 4.2 06/10/2012   CL 100 06/10/2012   CREATININE 2.94* 06/10/2012   BUN 51* 06/10/2012   CO2 23 06/10/2012   TSH 2.764 06/06/2012   INR 1.06 06/04/2010   Lab Results    Component Value Date   TROPONINI 1.87* 06/07/2012     CATH: [facility]  CARDIAC CATHETERIZATION  History obtained from chart review. Mr. Bill is an 78 year old married Caucasian male father of 4 bilateral children who I recently saw in the office on 05/03/2012. He has a history of CAD and peripheral vascular occlusive disease. He had stenting of his LAD, RCA and OM branches in [place]. He's had bilateral carotid endarterectomies at [facility] in [place] as well as bilateral carotid stenting she also [place]. His other problems include hypertension, hyperlipidemia, diabetes and chronic renal insufficiency with creatinines around in the 3 range a chronic clearance of less than 20. He is critical aortic stenosis which has been progressive with gradient of greater than 90 mm of mercury the valve area of 0.61 cm. He does complain of exertional chest pain and dyspnea. Carotid Dopplers have shown progressive right in-stent and the risk of restenosis of his carotid stent. Left Performed 2 years ago showed patent stents in his mid RCA, proximal LAD an occluded OM branch. He has seen Dr. Servando Snare for about a surgical evaluation for aortic valve replacement. He presents now for right and left heart catheter for his anatomy, physiology and image his right  PROCEDURE DESCRIPTION:  The patient was brought to the second floor  [facility] Cardiac cath lab  in the postabsorptive state. He was not  premedicated .Marland Kitchen His right groinwas prepped and shaved in usual sterile fashion. Xylocaine 1% was used for local anesthesia. A the circumflex French sheath was inserted into the right common femoral  artery using standard Seldinger technique.a 7 [place] sheath was inserted into the right common femoral vein using standard Seldinger technique. A 7 French balloon tip thermal dilution Swan-Ganz catheter was advanced through the right heart chambers obtaining sequential  pressures, arterial and venous blood samples for Fick and thermal dilution cardiac outputs. Visipaque dye was used for the entirety of the case. 5 French right and left Judkins diagnostic catheters were used for selective coronary angiography, selective right carotid angiography. The right Judkins cath the catheter was used to cross the aortic valve is 35 straight wire and a pullback gradient was obtained. A total of 30 cc of contrast was administered to the patient.  HEMODYNAMICS:  AO SYSTOLIC/AO DIASTOLIC: 123XX123  LV SYSTOLIC/LV DIASTOLIC: 99991111, and diastolic pressure 36  RA pressure: 12/10, mean 8  Pulmonary artery pressure: 43/20, mean 33  Pulmonary capillary wedge pressure: A wave 28, BUN 31, mean 24  Aortic valve gradient 43 mm mercury  Cardiac output (Fick) 6.97 L per minute with an index of 3.96 L per minute per meter squared  Aortic valve area I checked 0.61 cm, (Thermal), 0.9 cm (thermal)  ANGIOGRAPHIC RESULTS:  1. Left main; normal  2. LAD; patent proximal stent  3. Left circumflex; occluded OM 2 stent.  4. Right coronary artery; patent stent in the mid coronary right coronary artery, dominant vessel, 70% ostial stenosis with damping using a 5 French catheter  5. Left ventriculography; not performed  6. Selective right carotid angiography: There was a 70-80% "in-stent restenosis" within the right common carotid artery stent  IMPRESSION:the patient hast patent stent in his proximal LAD, mid RCA with occluded OM 2 branch. He has damping of the ostium of the dominant right. He has critical area of stenosis of the valve area 0.6 cm and high-grade in-stent restenosis" within his right common carotid artery stent. I believe he'll need aortic valve replacement, one-vessel bypass of his RCA, and percutaneous transposition of his right carotid artery for in-stent restenosis prior to his coronary surgery. The sheaths were removed and pressure was held and the groin to achieve hemostasis. The  patient left the Cath Lab in stable condition. Because of his stage IV chronic renal insufficiency he'll need hydration overnight, lab work in the morning and was found to be discharged home.  Lorretta Harp MD, [facility]    Echocardiogram at [facility] heart and vascular dated 03/19/2012 showed normal aortic root size maximum velocity across the aortic valve 477 cm/s estimated MAC peak gradient of 91 gradient of 64 valve area of 0.62  EF 55 % with diastolic dysfunction  Assessment / Plan:      I've reviewed with the patient the diagnosis of critical aortic stenosis symptomatic in the setting of severe end-stage renal disease and likely need of dialysis in the near future. The risks of aortic valve replacement and coronary bypass have been reviewed in detail. I have discussed with the patient percutaneous valve replacement in addition. Patient was to have carotid stenting prior to cardiac surgery, but now has been admitted with positive troponin and episode of chest pain/sob.  Critical As and CAD with symptoms rapidly increasing minimally displaced left infralateral rib fractures from fall in hospital CKD 4-5 evolving ESRD, needs access and assessment of complications   Anemia  assess and use epo   Metabolic Bone Disease: Pth P   Carotid disease  Dental caries needs addressed, but no abscess teeth  Patient agreeable to proceed with CABG and AVR, He and family are aware of the risks of surgery, estimated at Mortality 20%, Morbility or Mortality 63 %, stoke 75, prolonged ventilation 44%, Acute Renal failure 36%  Tentative plan for Wed, CABG and AVR  Grace Isaac MD  Beeper [phone] Office [phone] 06/11/2012 5:57 PM

## 2012-06-11 NOTE — Progress Notes (Signed)
Inpatient Diabetes Program Recommendations  AACE/ADA: New Consensus Statement on Inpatient Glycemic Control (2013)  Target Ranges:  Prepandial:   less than 140 mg/dL      Peak postprandial:   less than 180 mg/dL (1-2 hours)      Critically ill patients:  140 - 180 mg/dL   Reason for Visit:  Results for Collin Pearson, Collin Pearson (MRN [id number]) as of 06/11/2012 12:16  Ref. Range 06/10/2012 11:42 06/10/2012 16:37 06/10/2012 21:39 06/11/2012 07:42 06/11/2012 11:44  Glucose-Capillary Latest Range: 70-99 mg/dL 239 (H) 184 (H) 217 (H) 161 (H) 244 (H)   Please consider adding Novolog meal coverage 3 units tid with meals while in the hospital (hold if patient eats less than 50%).  May also consider holding Glucotrol while in the hospital.

## 2012-06-11 NOTE — Consult Note (Signed)
Requesting: Collin Lingo, MD Reason for consult: Recurrent stenosis right ICA   History of Present Illness:  Patient is a 78 y.o. year old male who presents for evaluation of carotid stenosis.  The patient denies symptoms of TIA, amaurosis, or stroke.  The patient previously had a right carotid stent elsewhere.  He is currently on aspirin for anti platelet therapy.  He had recent arteriogram by Dr Gwenlyn Found which showed some in-stent restenosis.  Left side "open" per report.   Other medical problems include CAD and aortic stenosis for which he is currently admitted after a decompensation.  Also has history of renal dysfunction.  Past Medical History  Diagnosis Date  . Clotting disorder   . Coronary artery disease   . AS (aortic stenosis)   . End stage renal disease   . Anginal pain   . Hypertension   . Diabetes mellitus   . Peripheral vascular disease   . GERD (gastroesophageal reflux disease)   . CKD (chronic kidney disease) stage 4, GFR 15-29 ml/min   . PVD (peripheral vascular disease), with hsx of bilateral carotid endarterectomies at [facility], and bilateral carotid stenting 05/18/2012  . Angina effort 05/18/2012  . Shortness of breath   . Heart murmur     Past Surgical History  Procedure Date  . Endarterectomy   . Prostate surgery   . Cardiac stents   . Cardiac catheterization      Social History History  Substance Use Topics  . Smoking status: Never Smoker   . Smokeless tobacco: Never Used  . Alcohol Use: No    Family History History reviewed. No pertinent family history.  Allergies  No Known Allergies   Current Facility-Administered Medications  Medication Dose Route Frequency Provider Last Rate Last Dose  . 0.9 %  sodium chloride infusion   Intravenous Continuous Doreene Burke [place], PA      . 0.9 %  sodium chloride infusion   Intravenous Continuous Erlene Quan, [place]      . acetaminophen (TYLENOL) tablet 650 mg  650 mg Oral Q4H PRN Erlene Quan, PA   650 mg at 06/10/12  1022  . ALPRAZolam Duanne Moron) tablet 0.25 mg  0.25 mg Oral BID PRN Erlene Quan, PA   0.25 mg at 06/10/12 2150  . aspirin EC tablet 81 mg  81 mg Oral Daily Doreene Burke [place], [place]   81 mg at 06/10/12 1022  . atenolol (TENORMIN) tablet 25 mg  25 mg Oral Daily Doreene Burke [place], [place]   25 mg at 06/10/12 1022  . atorvastatin (LIPITOR) tablet 40 mg  40 mg Oral QHS Doreene Burke [place], [place]   40 mg at 06/10/12 2150  . darifenacin (ENABLEX) 24 hr tablet 7.5 mg  7.5 mg Oral Daily Erlene Quan, PA   7.5 mg at 06/10/12 1022  . ferrous fumarate (HEMOCYTE - 106 mg FE) tablet 106 mg of iron  106 mg of iron Oral BID Erlene Quan, PA   106 mg of iron at 06/10/12 2150  . furosemide (LASIX) tablet 20 mg  20 mg Oral Daily Tarri Fuller, PA      . glipiZIDE (GLUCOTROL) tablet 10 mg  10 mg Oral Q breakfast Doreene Burke [place], [place]   10 mg at 06/11/12 0804  . hydrALAZINE (APRESOLINE) tablet 25 mg  25 mg Oral Q8H Leonie Man, MD   25 mg at 06/11/12 0645  . insulin aspart (novoLOG) injection 0-5 Units  0-5 Units Subcutaneous QHS Doreene Burke  [place], [place]   2 Units at 06/10/12 2150  . insulin aspart (novoLOG) injection 0-9 Units  0-9 Units Subcutaneous TID WC Doreene Burke [place], PA   1 Units at 06/11/12 0804  . isosorbide mononitrate (IMDUR) 24 hr tablet 30 mg  30 mg Oral Daily Tarri Fuller, PA      . morphine 2 MG/ML injection 2 mg  2 mg Intravenous Q2H PRN Erlene Quan, PA      . multivitamin with minerals tablet 1 tablet  1 tablet Oral Daily Troy Sine, MD   1 tablet at 06/10/12 1022  . nitroGLYCERIN (NITROSTAT) SL tablet 0.4 mg  0.4 mg Sublingual Q5 Min x 3 PRN Erlene Quan, PA      . omega-3 acid ethyl esters (LOVAZA) capsule 1 g  1 g Oral TID Troy Sine, MD   1 g at 06/10/12 2150  . ondansetron (ZOFRAN) injection 4 mg  4 mg Intravenous Q6H PRN Erlene Quan, PA      . pantoprazole (PROTONIX) EC tablet 40 mg  40 mg Oral Daily Doreene Burke [place], [place]   40 mg at 06/10/12 1022  . sodium chloride (OCEAN) 0.65 % nasal spray 1 spray  1 spray Each Nare PRN  Leonie Man, MD      . Tamsulosin HCl [facility]) capsule 0.4 mg  0.4 mg Oral Daily Doreene Burke [place], [place]   0.4 mg at 06/10/12 1022  . traMADol (ULTRAM) tablet 50 mg  50 mg Oral Q6H PRN Erlene Quan, PA   50 mg at 06/11/12 0406  . zolpidem (AMBIEN) tablet 5 mg  5 mg Oral QHS PRN Erlene Quan, PA   5 mg at 06/08/12 2156  . DISCONTD: furosemide (LASIX) tablet 40 mg  40 mg Oral Daily Leonie Man, MD   40 mg at 06/10/12 1022  . DISCONTD: nitroGLYCERIN (NITROGLYN) 2 % ointment 0.5 inch  0.5 inch Topical Q8H Erlene Quan, PA   0.5 inch at 06/11/12 0645    ROS:   General:  No weight loss, Fever, chills  HEENT: No recent headaches, no nasal bleeding, no visual changes, no sore throat  Neurologic: No dizziness, blackouts, seizures. No recent symptoms of stroke or mini- stroke. No recent episodes of slurred speech, or temporary blindness.  Cardiac: + recent episodes of chest pain/pressure, + shortness of breath at rest.  + shortness of breath with exertion.   Vascular: No history of rest pain in feet.  No history of claudication.  No history of non-healing ulcer, No history of DVT   Pulmonary: No home oxygen, no productive cough, no hemoptysis,  No asthma or wheezing  Musculoskeletal:  [ ]  Arthritis, [ ]  Low back pain,  [ ]  Joint pain  Hematologic:No history of hypercoagulable state.  No history of easy bleeding.  No history of anemia  Gastrointestinal: No hematochezia or melena,  No gastroesophageal reflux, no trouble swallowing  Urinary: [x ] chronic Kidney disease, [ ]  on HD - [ ]  MWF or [ ]  TTHS, [ ]  Burning with urination, [ ]  Frequent urination, [ ]  Difficulty urinating;   Skin: No rashes  Psychological: No history of anxiety,  No history of depression   Physical Examination  Filed Vitals:   06/11/12 0235 06/11/12 0415 06/11/12 0645 06/11/12 0739  BP:   145/56   Pulse:      Temp:  97.4 F (36.3 C)  97.5 F (36.4 C)  TempSrc:  Oral  Oral  Resp:  18    Height:        Weight: 138 lb 0.1 oz (62.6 kg)     SpO2:  97%  94%    Body mass index is 22.62 kg/(m^2).  General:  Alert and oriented, no acute distress HEENT: Normal Neck: No bruit or JVD Pulmonary: Clear to auscultation bilaterally Cardiac: Regular Rate and Rhythm Gastrointestinal: Soft, non-tender, non-distended, no mass Skin: No rash Extremity Pulses:  2+ radial, brachial, femoral bilaterally Musculoskeletal: No deformity or edema  Neurologic: Upper and lower extremity motor 5/5 and symmetric  DATA: I reviewed the images of his recent arteriogram by Dr Gwenlyn Found.  Ride side carotid injection shows smooth recurrent stenosis 60% by my measurement using the normal distal ICA as a denominator.   ASSESSMENT: Moderate asymptomatic recurrent stenosis of right carotid stent.  Left side no significant stenosis per report but duplex from Dr Kennon Holter office not available   PLAN:  Will obtain report of recent duplex. If no significant left side stenosis his carotid stenting can be deferred until after and cardiac procedure as the right side lesion should overall be fairly low risk.  Ruta Hinds, MD [facility] [place] [place] Office: [phone] Pager: [phone]    Ruta Hinds, MD [facility] [place]: [phone] Pager: [phone]

## 2012-06-12 ENCOUNTER — Other Ambulatory Visit: Payer: Self-pay | Admitting: Cardiovascular Disease

## 2012-06-12 ENCOUNTER — Inpatient Hospital Stay (HOSPITAL_COMMUNITY): Payer: PRIVATE HEALTH INSURANCE

## 2012-06-12 DIAGNOSIS — I2510 Atherosclerotic heart disease of native coronary artery without angina pectoris: Secondary | ICD-10-CM

## 2012-06-12 DIAGNOSIS — N186 End stage renal disease: Secondary | ICD-10-CM

## 2012-06-12 DIAGNOSIS — Z01810 Encounter for preprocedural cardiovascular examination: Secondary | ICD-10-CM

## 2012-06-12 DIAGNOSIS — I359 Nonrheumatic aortic valve disorder, unspecified: Secondary | ICD-10-CM

## 2012-06-12 LAB — PARATHYROID HORMONE, INTACT (NO CA): PTH: 42.9 pg/mL (ref 14.0–72.0)

## 2012-06-12 LAB — ABO/RH: ABO/RH(D): A POS

## 2012-06-12 LAB — CBC
HCT: 28.4 % — ABNORMAL LOW (ref 39.0–52.0)
Hemoglobin: 9.3 g/dL — ABNORMAL LOW (ref 13.0–17.0)
MCH: 29.2 pg (ref 26.0–34.0)
MCHC: 32.7 g/dL (ref 30.0–36.0)
RBC: 3.18 MIL/uL — ABNORMAL LOW (ref 4.22–5.81)

## 2012-06-12 LAB — SURGICAL PCR SCREEN
MRSA, PCR: NEGATIVE
Staphylococcus aureus: POSITIVE — AB

## 2012-06-12 LAB — COMPREHENSIVE METABOLIC PANEL
ALT: 47 U/L (ref 0–53)
ALT: 39 U/L (ref 0–53)
AST: 42 U/L — ABNORMAL HIGH (ref 0–37)
Albumin: 2.8 g/dL — ABNORMAL LOW (ref 3.5–5.2)
Alkaline Phosphatase: 50 U/L (ref 39–117)
BUN: 57 mg/dL — ABNORMAL HIGH (ref 6–23)
CO2: 23 mEq/L (ref 19–32)
CO2: 22 mEq/L (ref 19–32)
Calcium: 9.3 mg/dL (ref 8.4–10.5)
Calcium: 9.5 mg/dL (ref 8.4–10.5)
Chloride: 99 mEq/L (ref 96–112)
Chloride: 99 mEq/L (ref 96–112)
Creatinine, Ser: 3.07 mg/dL — ABNORMAL HIGH (ref 0.50–1.35)
Creatinine, Ser: 2.65 mg/dL — ABNORMAL HIGH (ref 0.50–1.35)
GFR calc Af Amer: 20 mL/min — ABNORMAL LOW (ref >90)
GFR calc Af Amer: 24 mL/min — ABNORMAL LOW (ref >90)
GFR calc non Af Amer: 17 mL/min — ABNORMAL LOW (ref >90)
GFR calc non Af Amer: 21 mL/min — ABNORMAL LOW (ref >90)
Glucose, Bld: 173 mg/dL — ABNORMAL HIGH (ref 70–99)
Glucose, Bld: 137 mg/dL — ABNORMAL HIGH (ref 70–99)
Potassium: 4.3 mEq/L (ref 3.5–5.1)
Sodium: 133 mEq/L — ABNORMAL LOW (ref 135–145)
Total Bilirubin: 0.3 mg/dL (ref 0.3–1.2)
Total Bilirubin: 0.3 mg/dL (ref 0.3–1.2)
Total Protein: 6.5 g/dL (ref 6.0–8.3)

## 2012-06-12 LAB — GLUCOSE, CAPILLARY
Glucose-Capillary: 137 mg/dL — ABNORMAL HIGH (ref 70–99)
Glucose-Capillary: 147 mg/dL — ABNORMAL HIGH (ref 70–99)
Glucose-Capillary: 176 mg/dL — ABNORMAL HIGH (ref 70–99)

## 2012-06-12 LAB — HEPATITIS B CORE ANTIBODY, IGM: Hep B C IgM: NEGATIVE

## 2012-06-12 LAB — POCT I-STAT 3, ART BLOOD GAS (G3+)
Acid-base deficit: 1 mmol/L (ref 0.0–2.0)
Bicarbonate: 22.7 mEq/L (ref 20.0–24.0)
Patient temperature: 98.6
TCO2: 24 mmol/L (ref 0–100)

## 2012-06-12 LAB — PROTIME-INR
INR: 1.06 (ref 0.00–1.49)
Prothrombin Time: 13.7 seconds (ref 11.6–15.2)

## 2012-06-12 LAB — IRON AND TIBC: Iron: 38 ug/dL — ABNORMAL LOW (ref 42–135)

## 2012-06-12 LAB — HEPATITIS B SURFACE ANTIGEN: Hepatitis B Surface Ag: NEGATIVE

## 2012-06-12 LAB — APTT: aPTT: 53 seconds — ABNORMAL HIGH (ref 24–37)

## 2012-06-12 MED ORDER — SODIUM CHLORIDE 0.9 % IV SOLN
INTRAVENOUS | Status: AC
  Administered 2012-06-13: 1.8 [IU]/h via INTRAVENOUS
  Filled 2012-06-12: qty 1

## 2012-06-12 MED ORDER — DEXTROSE 5 % IV SOLN
1.5000 g | INTRAVENOUS | Status: AC
  Administered 2012-06-13: .75 g via INTRAVENOUS
  Administered 2012-06-13: 1.5 g via INTRAVENOUS
  Filled 2012-06-12: qty 1.5

## 2012-06-12 MED ORDER — PLASMA-LYTE 148 IV SOLN
INTRAVENOUS | Status: AC
  Administered 2012-06-13: 11:00:00
  Filled 2012-06-12: qty 2.5

## 2012-06-12 MED ORDER — FUROSEMIDE 80 MG PO TABS
80.0000 mg | ORAL_TABLET | Freq: Two times a day (BID) | ORAL | Status: DC
  Administered 2012-06-12: 80 mg via ORAL
  Filled 2012-06-12 (×5): qty 1

## 2012-06-12 MED ORDER — DEXMEDETOMIDINE HCL IN NACL 400 MCG/100ML IV SOLN
0.1000 ug/kg/h | INTRAVENOUS | Status: AC
  Administered 2012-06-13: .2 ug/kg/h via INTRAVENOUS
  Filled 2012-06-12: qty 100

## 2012-06-12 MED ORDER — BISACODYL 5 MG PO TBEC
5.0000 mg | DELAYED_RELEASE_TABLET | Freq: Once | ORAL | Status: AC
  Administered 2012-06-12: 5 mg via ORAL
  Filled 2012-06-12: qty 1

## 2012-06-12 MED ORDER — SODIUM CHLORIDE 0.9 % IV SOLN
INTRAVENOUS | Status: DC
  Filled 2012-06-12: qty 40

## 2012-06-12 MED ORDER — MAGNESIUM SULFATE 50 % IJ SOLN
40.0000 meq | INTRAMUSCULAR | Status: DC
  Filled 2012-06-12: qty 10

## 2012-06-12 MED ORDER — DOPAMINE-DEXTROSE 3.2-5 MG/ML-% IV SOLN
2.0000 ug/kg/min | INTRAVENOUS | Status: AC
Start: 2012-06-13 — End: 2012-06-13
  Administered 2012-06-13: 3 ug/kg/min via INTRAVENOUS
  Filled 2012-06-12: qty 250

## 2012-06-12 MED ORDER — HYDRALAZINE HCL 50 MG PO TABS
50.0000 mg | ORAL_TABLET | Freq: Three times a day (TID) | ORAL | Status: DC
  Administered 2012-06-12 – 2012-06-13 (×3): 50 mg via ORAL
  Filled 2012-06-12 (×6): qty 1

## 2012-06-12 MED ORDER — DEXTROSE 5 % IV SOLN
750.0000 mg | INTRAVENOUS | Status: DC
  Filled 2012-06-12: qty 750

## 2012-06-12 MED ORDER — POTASSIUM CHLORIDE 2 MEQ/ML IV SOLN
80.0000 meq | INTRAVENOUS | Status: DC
  Filled 2012-06-12: qty 40

## 2012-06-12 MED ORDER — DEXTROSE 5 % IV SOLN
0.5000 ug/min | INTRAVENOUS | Status: DC
  Filled 2012-06-12: qty 4

## 2012-06-12 MED ORDER — VANCOMYCIN HCL 1000 MG IV SOLR
1250.0000 mg | INTRAVENOUS | Status: AC
  Administered 2012-06-13: 1250 mg via INTRAVENOUS
  Filled 2012-06-12: qty 1250

## 2012-06-12 MED ORDER — PHENYLEPHRINE HCL 10 MG/ML IJ SOLN
30.0000 ug/min | INTRAVENOUS | Status: AC
  Administered 2012-06-13: 25 ug/min via INTRAVENOUS
  Filled 2012-06-12: qty 2

## 2012-06-12 MED ORDER — METOPROLOL TARTRATE 12.5 MG HALF TABLET
12.5000 mg | ORAL_TABLET | Freq: Once | ORAL | Status: AC
  Administered 2012-06-13: 12.5 mg via ORAL
  Filled 2012-06-12: qty 1

## 2012-06-12 MED ORDER — NITROGLYCERIN IN D5W 200-5 MCG/ML-% IV SOLN
2.0000 ug/min | INTRAVENOUS | Status: AC
  Administered 2012-06-13: 16.6 ug/min via INTRAVENOUS
  Filled 2012-06-12: qty 250

## 2012-06-12 MED ORDER — CHLORHEXIDINE GLUCONATE 4 % EX LIQD
60.0000 mL | Freq: Once | CUTANEOUS | Status: AC
  Administered 2012-06-13: 4 via TOPICAL
  Filled 2012-06-12: qty 60
  Filled 2012-06-12: qty 30

## 2012-06-12 MED ORDER — SODIUM CHLORIDE 0.9 % IV SOLN
1020.0000 mg | Freq: Once | INTRAVENOUS | Status: AC
  Administered 2012-06-12: 1020 mg via INTRAVENOUS
  Filled 2012-06-12: qty 34

## 2012-06-12 NOTE — Consult Note (Signed)
Asked by Dr Deterding to evaluate pt for long term hemodialysis access.  Pt previously seen for carotid stenosis.  Pt for Aortic valve replacement by Dr Servando Snare tomorrow.  Pt currently not on dialysis but expectation is that he may be soon.  BMET    Component Value Date/Time   NA 134* 06/12/2012 0500   K 4.1 06/12/2012 0500   CL 99 06/12/2012 0500   CO2 23 06/12/2012 0500   GLUCOSE 173* 06/12/2012 0500   BUN 58* 06/12/2012 0500   CREATININE 3.07* 06/12/2012 0500   CALCIUM 9.3 06/12/2012 0500   GFRNONAA 17* 06/12/2012 0500   GFRAA 20* 06/12/2012 0500    ROS: mild dyspnea with exertion, no chest pain  PE: Filed Vitals:   06/12/12 0729 06/12/12 1000 06/12/12 1214 06/12/12 1231  BP: 146/51 142/38 162/59   Pulse: 59   56  Temp: 98.1 F (36.7 C)   97.7 F (36.5 C)  TempSrc: Oral   Oral  Resp:      Height:      Weight:      SpO2: 96%      Extremities:  2+radial brachial pulses bilat  Skin: small hematoma left forearm with approx. 10 x 5 area of ecchymosis  Data: vein map shows small cephalic vein bilat. , basilic left arm 3-4 mm  Assessment/Plan: Needs long term hemodialysis access but currently awaiting aortic valve replacement which is his most pressing issue.  Potentially candidate for basilic vein fistula or AV graft but will defer this for now until he recovers from his cardiac operation.  We are available for Diatek catheter if he needs dialysis in the interim period.  Ruta Hinds, MD [facility] [place] Office: [phone] Pager: [phone]

## 2012-06-12 NOTE — Progress Notes (Signed)
[facility]  Subjective: Sound asleep when I entered.  Doing well.  Ribs hurt when he moves around.  Objective: Vital signs in last 24 hours: Temp:  [97.3 F (36.3 C)-97.8 F (36.6 C)] 97.8 F (36.6 C) (10/29 0400) Pulse Rate:  [53-61] 61  (10/29 0400) Resp:  [15-22] 22  (10/29 0400) BP: (131-148)/(42-62) 146/51 mmHg (10/29 0729) SpO2:  [94 %-97 %] 96 % (10/29 0400) Weight:  [63.6 kg (140 lb 3.4 oz)] 63.6 kg (140 lb 3.4 oz) (10/29 0400) Last BM Date: 06/10/12  Intake/Output from previous day: 10/28 0701 - 10/29 0700 In: 40 [P.O.:830] Out: 1950 [Urine:1950] Intake/Output this shift: Total I/O In: -  Out: 200 [Urine:200]  Medications Current Facility-Administered Medications  Medication Dose Route Frequency Provider Last Rate Last Dose  . 0.9 %  sodium chloride infusion   Intravenous Continuous Doreene Burke [place], PA      . 0.9 %  sodium chloride infusion   Intravenous Continuous Erlene Quan, [place]      . acetaminophen (TYLENOL) tablet 650 mg  650 mg Oral Q4H PRN Erlene Quan, PA   650 mg at 06/10/12 1022  . albuterol (PROVENTIL) (5 MG/ML) 0.5% nebulizer solution 2.5 mg  2.5 mg Nebulization Once Grace Isaac, MD   2.5 mg at 06/11/12 1446  . ALPRAZolam Duanne Moron) tablet 0.25 mg  0.25 mg Oral BID PRN Erlene Quan, PA   0.25 mg at 06/10/12 2150  . aspirin EC tablet 81 mg  81 mg Oral Daily Doreene Burke [place], [place]   81 mg at 06/11/12 1009  . atenolol (TENORMIN) tablet 25 mg  25 mg Oral Daily Doreene Burke [place], [place]   25 mg at 06/11/12 1322  . atorvastatin (LIPITOR) tablet 40 mg  40 mg Oral QHS Doreene Burke [place], [place]   40 mg at 06/11/12 2126  . darbepoetin (ARANESP) injection 100 mcg  100 mcg Subcutaneous Q Tue-1800 Joyice Faster Deterding, MD      . darifenacin (ENABLEX) 24 hr tablet 7.5 mg  7.5 mg Oral Daily Doreene Burke [place], PA   7.5 mg at 06/11/12 1009  . ferrous fumarate (HEMOCYTE - 106 mg FE) tablet 106 mg of iron  106 mg of iron Oral BID Erlene Quan, PA   106 mg of iron  at 06/11/12 2126  . furosemide (LASIX) tablet 20 mg  20 mg Oral Daily Tarri Fuller, PA   20 mg at 06/11/12 1008  . glipiZIDE (GLUCOTROL) tablet 10 mg  10 mg Oral Q breakfast Doreene Burke [place], [place]   10 mg at 06/11/12 0804  . hydrALAZINE (APRESOLINE) tablet 25 mg  25 mg Oral Q8H Leonie Man, MD   25 mg at 06/12/12 0530  . insulin aspart (novoLOG) injection 0-5 Units  0-5 Units Subcutaneous QHS Doreene Burke [place], [place]   2 Units at 06/11/12 2127  . insulin aspart (novoLOG) injection 0-9 Units  0-9 Units Subcutaneous TID WC Doreene Burke [place], PA   3 Units at 06/11/12 1639  . isosorbide mononitrate (IMDUR) 24 hr tablet 30 mg  30 mg Oral Daily Tarri Fuller, PA   30 mg at 06/11/12 1100  . lidocaine (LIDODERM) 5 % 1 patch  1 patch Transdermal Q24H Leonie Man, MD   1 patch at 06/11/12 1200  . morphine 2 MG/ML injection 2 mg  2 mg Intravenous Q2H PRN Erlene Quan, PA      . multivitamin with minerals tablet 1 tablet  1 tablet Oral Daily Troy Sine, MD   1 tablet at 06/11/12 1006  . nitroGLYCERIN (NITROSTAT) SL tablet 0.4 mg  0.4 mg Sublingual Q5 Min x 3 PRN Erlene Quan, PA      . omega-3 acid ethyl esters (LOVAZA) capsule 1 g  1 g Oral TID Troy Sine, MD   1 g at 06/11/12 2126  . ondansetron (ZOFRAN) injection 4 mg  4 mg Intravenous Q6H PRN Erlene Quan, PA      . pantoprazole (PROTONIX) EC tablet 40 mg  40 mg Oral Daily Doreene Burke [place], [place]   40 mg at 06/11/12 1006  . sodium chloride (OCEAN) 0.65 % nasal spray 1 spray  1 spray Each Nare PRN Leonie Man, MD      . Tamsulosin HCl [facility]) capsule 0.4 mg  0.4 mg Oral Daily Doreene Burke [place], [place]   0.4 mg at 06/11/12 1007  . traMADol (ULTRAM) tablet 50 mg  50 mg Oral Q6H PRN Erlene Quan, PA   50 mg at 06/11/12 0406  . zolpidem (AMBIEN) tablet 5 mg  5 mg Oral QHS PRN Erlene Quan, PA   5 mg at 06/12/12 0142  . DISCONTD: furosemide (LASIX) tablet 40 mg  40 mg Oral Daily Leonie Man, MD   40 mg at 06/10/12 1022  . DISCONTD: nitroGLYCERIN (NITROGLYN) 2 %  ointment 0.5 inch  0.5 inch Topical Q8H Erlene Quan, PA   0.5 inch at 06/11/12 0645    PE: General appearance: alert, cooperative and no distress  Lungs: clear to auscultation bilaterally  Heart: regular rate and rhythm and 3/6 sys mm  Extremities: No LEE  Pulses: 2+ and symmetric  Skin: Ecchymosis more apparent on the left posterior thorasic area today.  Neurologic: Grossly normal    Lab Results:   Basename 06/12/12 0500 06/10/12 0555  WBC 9.0 8.3  HGB 9.3* 10.3*  HCT 28.4* 29.9*  PLT 258 240   BMET  Basename 06/12/12 0500 06/10/12 0555  NA 134* 135  K 4.1 4.2  CL 99 100  CO2 23 23  GLUCOSE 173* 96  BUN 58* 51*  CREATININE 3.07* 2.94*  CALCIUM 9.3 9.2   PORTABLE CHEST - 1 VIEW  Comparison: Chest x-ray 06/08/2012.  Findings: Vascular stent in the lower right cervical region is  incompletely visualized, but likely within the carotid artery.  Lung volumes are low. There are bibasilar opacities (left greater  than right), favored to predominately reflect subsegmental  atelectasis, however, sequelae of aspiration or developing  infection could have a similar appearance, particularly on the  left. Trace bilateral pleural effusions. Pulmonary vasculature is  within normal limits given the low lung volumes. Heart size is  normal. Mediastinal contours are distorted by patient rotation to  the left. Atherosclerosis of the thoracic aorta. No definite  displaced rib fractures are identified on this portable  examination. No definite pneumothorax.  IMPRESSION:  1. No chest tubes identified. No pneumothorax.  2. Low lung volumes with bibasilar opacities favored to represent  subsegmental atelectasis, with superimposed small bilateral pleural  effusions.  3. Atherosclerosis.  Assessment/Plan   Principal Problem:  *NSTEMI (non-ST elevated myocardial infarction) Active Problems:  AS (aortic stenosis), critical stenosis  Rib fractures, several, left side   CAD, patent  stent to LAD and mid RCA with occluded OM2 branch  05/18/12,plan for CABG/AVR  PVD, with hx of bilateral CEA at [facility], and previous bilateral carotid stenting, with high grade  ISR of Rt. carotid 05/18/12  HTN (hypertension)  Dyslipidemia  Diabetes mellitus  Chronic anemia  Acute pulmonary edema  CKD (chronic kidney disease) stage 4, GFR 15-29 ml/min  Plan:  CABG/AVR this Wednesday.  Small bilateral pleural effusions.  Per Dr. Nona Dell, ok to postpone carotid stenting until after CABG/AVR.  Titrating hydralazine to 50mg  Q8hr for better BP control.      LOS: 6 days    Pearson, Collin 06/12/2012 8:21 AM  I have seen and examined the patient along with Tarri Fuller, PA.  I have reviewed the chart, notes and new data.  I agree with PA's note.  Key new complaints: a little sore at trauma sites Key examination changes: no rales, AS murmur is mid to late peaking Key new findings / data: creat hovering around 3  PLAN: High risk CABG+AVR tomorrow. Considerable risks outlined in Dr. Everrett Coombe note. Also substantial risk of AFib. Consider pre-loading with amiodarone.  Sanda Klein, MD, [place] [phone] 06/12/2012, 9:17 AM

## 2012-06-12 NOTE — Progress Notes (Signed)
VASCULAR LAB PRELIMINARY  PRELIMINARY  PRELIMINARY  PRELIMINARY  Right  Upper Extremity Vein Map    Cephalic  Segment Diameter Depth Comment  1. Axilla 0.78mm mm   2. Mid upper arm 1.70mm mm   3. Above AC 1.66mm mm   4. In AC 1.48mm mm   5. Below AC 2.60mm mm Medially in the forearm  6. Mid forearm 3.47mm mm Medially in the forearm  7. Wrist 2.4mm mm Medially in the forearm   mm mm    mm mm    mm mm    Basilic  Segment Diameter Depth Comment  1. Axilla 2.15mm 53mm   2. Mid upper arm 2.48mm 3.43mm   3. Above AC 1.32mm mm Branch  4. In AC mm mm   5. Below AC mm mm   6. Mid forearm mm mm   7. Wrist mm mm    mm mm    mm mm    mm mm     Left Upper Extremity Vein Map    Cephalic  Segment Diameter Depth Comment  1. Axilla mm mm Not visualized in the upper arm  2. Mid upper arm mm mm   3. Above AC mm mm   4. In AC mm mm Obscured by dressing  5. Below AC mm mm Obscured by dressing  6. Mid forearm 2.63mm mm   7. Wrist 2.24mm mm    mm mm    mm mm    mm mm    Basilic  Segment Diameter Depth Comment  1. Axilla 8.54mm 4.5mm   2. Mid upper arm 3.51mm 4.54mm   3. Above [facility] 3.66mm 5.9mm Branch   4. In AC mm mm Obscured by dressing  5. Below AC mm mm   6. Mid forearm mm mm   7. Wrist mm mm    mm mm    mm mm    mm mm     Laurin Paulo, RVT 06/12/2012, 2:00 PM

## 2012-06-12 NOTE — Progress Notes (Signed)
Subjective: Interval History: none.  Objective: Vital signs in last 24 hours: Temp:  [97.3 F (36.3 C)-98.1 F (36.7 C)] 98.1 F (36.7 C) (10/29 0729) Pulse Rate:  [53-61] 59  (10/29 0729) Resp:  [15-22] 22  (10/29 0400) BP: (131-148)/(42-62) 146/51 mmHg (10/29 0729) SpO2:  [94 %-97 %] 96 % (10/29 0729) Weight:  [63.6 kg (140 lb 3.4 oz)] 63.6 kg (140 lb 3.4 oz) (10/29 0400) Weight change: 1 kg (2 lb 3.3 oz)  Intake/Output from previous day: 10/28 0701 - 10/29 0700 In: 830 [P.O.:830] Out: 1950 [Urine:1950] Intake/Output this shift: Total I/O In: -  Out: 200 [Urine:200]  General appearance: alert, cooperative and pale Resp: rales bibasilar Cardio: S1, S2 normal and systolic murmur: systolic ejection 2/6, decrescendo at 2nd left intercostal space GI: soft, non-tender; bowel sounds normal; no masses,  no organomegaly and live down 4 cm Extremities: edema 1+ Gr 2/6 Dia M at ULSB  Lab Results:  Basename 06/12/12 0500 06/10/12 0555  WBC 9.0 8.3  HGB 9.3* 10.3*  HCT 28.4* 29.9*  PLT 258 240   BMET:  Basename 06/12/12 0500 06/10/12 0555  NA 134* 135  K 4.1 4.2  CL 99 100  CO2 23 23  GLUCOSE 173* 96  BUN 58* 51*  CREATININE 3.07* 2.94*  CALCIUM 9.3 9.2   No results found for this basename: PTH:2 in the last 72 hours Iron Studies:  Basename 06/11/12 1525  IRON 38*  TIBC 379  TRANSFERRIN --  FERRITIN --    Studies/Results: Dg Chest Port 1 View  06/12/2012  *RADIOLOGY REPORT*  Clinical Data: Evaluate chest tubes.  Left-sided chest pain.  Rib fractures.  PORTABLE CHEST - 1 VIEW  Comparison: Chest x-ray 06/08/2012.  Findings: Vascular stent in the lower right cervical region is incompletely visualized, but likely within the carotid artery. Lung volumes are low.  There are bibasilar opacities (left greater than right), favored to predominately reflect subsegmental atelectasis, however, sequelae of aspiration or developing infection could have a similar appearance,  particularly on the left.  Trace bilateral pleural effusions.  Pulmonary vasculature is within normal limits given the low lung volumes.  Heart size is normal.  Mediastinal contours are distorted by patient rotation to the left.  Atherosclerosis of the thoracic aorta. No definite displaced rib fractures are identified on this portable examination.  No definite pneumothorax.  IMPRESSION: 1.  No chest tubes identified.  No pneumothorax. 2.  Low lung volumes with bibasilar opacities favored to represent subsegmental atelectasis, with superimposed small bilateral pleural effusions. 3.  Atherosclerosis.   Original Report Authenticated By: Etheleen Mayhew, M.D.     I have reviewed the patient's current medications.  Assessment/Plan: 1 CKD 4 vol xs, anemic, ^ PTH . On epo, Fe low, bolus, Needs access. Save arm 2 HTN needs ^ diuresis 3 CAD 4 AV dz per CARDS 5 HPTH P 6 DM controlled 7 Carious dentition P Save arm, give Fe, epo, surgery    LOS: 6 days   Collin Pearson 06/12/2012,8:55 AM

## 2012-06-12 NOTE — Plan of Care (Signed)
Problem: Consults Goal: Cardiac Surgery Patient Education ( See Patient Education module for education specifics.) Outcome: Completed/Met Date Met:  06/12/12 Videos watched with family at bedside. Booklet given. Instructed on how to use incentive spirometer. Family at bedside during teaching.

## 2012-06-12 NOTE — Progress Notes (Signed)
Patient ID: Collin Pearson, male   DOB: Jul 20, 1927, 78 y.o.   MRN: [id number] TCTS DAILY PROGRESS NOTE                   [address]            [place] [address]          [phone]        Procedure(s) (LRB): CORONARY ARTERY BYPASS GRAFTING (CABG) (N/A) AORTIC VALVE REPLACEMENT (AVR) (N/A)  Total Length of Stay:  LOS: 6 days   Subjective: No chest pain today, feels well  Objective: Vital signs in last 24 hours: Temp:  [97.4 F (36.3 C)-98.3 F (36.8 C)] 98.3 F (36.8 C) (10/29 1726) Pulse Rate:  [53-61] 54  (10/29 1726) Cardiac Rhythm:  [-] Normal sinus rhythm;Heart block (10/29 0800) Resp:  [15-22] 22  (10/29 0400) BP: (134-162)/(38-59) 134/50 mmHg (10/29 1636) SpO2:  [94 %-96 %] 96 % (10/29 1726) Weight:  [140 lb 3.4 oz (63.6 kg)] 140 lb 3.4 oz (63.6 kg) (10/29 0400)  Filed Weights   06/10/12 0500 06/11/12 0235 06/12/12 0400  Weight: 139 lb 5.3 oz (63.2 kg) 138 lb 0.1 oz (62.6 kg) 140 lb 3.4 oz (63.6 kg)    Weight change: 2 lb 3.3 oz (1 kg)   Hemodynamic parameters for last 24 hours:    Intake/Output from previous day: 10/28 0701 - 10/29 0700 In: 830 [P.O.:830] Out: 1950 [Urine:1950]  Intake/Output this shift: Total I/O In: 480 [P.O.:480] Out: 1600 [Urine:1600]  Current Meds: Scheduled Meds:   . aspirin EC  81 mg Oral Daily  . atenolol  25 mg Oral Daily  . atorvastatin  40 mg Oral QHS  . darbepoetin (ARANESP) injection - NON-DIALYSIS  100 mcg Subcutaneous Q Tue-1800  . darifenacin  7.5 mg Oral Daily  . ferrous fumarate  106 mg of iron Oral BID  . ferumoxytol  1,020 mg Intravenous Once  . furosemide  80 mg Oral BID  . glipiZIDE  10 mg Oral Q breakfast  . hydrALAZINE  50 mg Oral Q8H  . insulin aspart  0-5 Units Subcutaneous QHS  . insulin aspart  0-9 Units Subcutaneous TID WC  . isosorbide mononitrate  30 mg Oral Daily  . lidocaine  1 patch Transdermal Q24H  . multivitamin with minerals  1 tablet Oral Daily  . omega-3 acid  ethyl esters  1 g Oral TID  . pantoprazole  40 mg Oral Daily  . Tamsulosin HCl  0.4 mg Oral Daily  . DISCONTD: furosemide  20 mg Oral Daily  . DISCONTD: hydrALAZINE  25 mg Oral Q8H   Continuous Infusions:   . sodium chloride Stopped (06/06/12 2108)  . sodium chloride Stopped (06/09/12 0900)   PRN Meds:.acetaminophen, ALPRAZolam, morphine injection, nitroGLYCERIN, ondansetron (ZOFRAN) IV, sodium chloride, traMADol, zolpidem  General appearance: alert and cooperative Neurologic: intact Heart: regular rate and rhythm, S1, S2 normal,  Murmur of AS, click, rub or gallop and normal apical impulse Lungs: clear to auscultation bilaterally and normal percussion bilaterally Abdomen: soft, non-tender; bowel sounds normal; no masses,  no organomegaly Extremities: extremities normal, atraumatic, no cyanosis or edema and Homans sign is negative, no sign of DVT Bruising  of left arm from fall   Lab Results: CBC: Basename 06/12/12 0500 06/10/12 0555  WBC 9.0 8.3  HGB 9.3* 10.3*  HCT 28.4* 29.9*  PLT 258 240   BMET:  Basename 06/12/12 0500 06/10/12 0555  NA 134* 135  K 4.1  4.2  CL 99 100  CO2 23 23  GLUCOSE 173* 96  BUN 58* 51*  CREATININE 3.07* 2.94*  CALCIUM 9.3 9.2    PT/INR: No results found for this basename: LABPROT,INR in the last 72 hours Radiology: Dg Chest Port 1 View  06/12/2012  *RADIOLOGY REPORT*  Clinical Data: Evaluate chest tubes.  Left-sided chest pain.  Rib fractures.  PORTABLE CHEST - 1 VIEW  Comparison: Chest x-ray 06/08/2012.  Findings: Vascular stent in the lower right cervical region is incompletely visualized, but likely within the carotid artery. Lung volumes are low.  There are bibasilar opacities (left greater than right), favored to predominately reflect subsegmental atelectasis, however, sequelae of aspiration or developing infection could have a similar appearance, particularly on the left.  Trace bilateral pleural effusions.  Pulmonary vasculature is within  normal limits given the low lung volumes.  Heart size is normal.  Mediastinal contours are distorted by patient rotation to the left.  Atherosclerosis of the thoracic aorta. No definite displaced rib fractures are identified on this portable examination.  No definite pneumothorax.  IMPRESSION: 1.  No chest tubes identified.  No pneumothorax. 2.  Low lung volumes with bibasilar opacities favored to represent subsegmental atelectasis, with superimposed small bilateral pleural effusions. 3.  Atherosclerosis.   Original Report Authenticated By: Etheleen Mayhew, M.D.      Assessment/Plan: S/P Procedure(s) (LRB): CORONARY ARTERY BYPASS GRAFTING (CABG) (N/A) AORTIC VALVE REPLACEMENT (AVR) (N/A) Plan surgery in am. Risks and expectations discussed in detail with patient and family.  The goals risks and alternatives of the planned surgical procedure cabg and avr  have been discussed with the patient in detail. The risks of the procedure including death, infection, stroke, myocardial infarction, bleeding, blood transfusion, heart block and need for pacer have all been discussed specifically.  I have quoted [name] a 20 % of perioperative mortality and a complication rate as high as 64 %. The patient's questions have been answered.Collin Pearson is willing  to proceed with the planned procedure.     Collin Pearson 06/12/2012 5:55 PM

## 2012-06-13 ENCOUNTER — Encounter (HOSPITAL_COMMUNITY): Payer: Self-pay | Admitting: Anesthesiology

## 2012-06-13 ENCOUNTER — Inpatient Hospital Stay (HOSPITAL_COMMUNITY): Payer: PRIVATE HEALTH INSURANCE

## 2012-06-13 ENCOUNTER — Inpatient Hospital Stay (HOSPITAL_COMMUNITY): Payer: PRIVATE HEALTH INSURANCE | Admitting: Anesthesiology

## 2012-06-13 ENCOUNTER — Encounter (HOSPITAL_COMMUNITY)
Admission: AD | Disposition: A | Discharge status: Skilled Nursing Facility | Payer: Self-pay | Source: Other Acute Inpatient Hospital | Attending: Cardiothoracic Surgery

## 2012-06-13 DIAGNOSIS — I2510 Atherosclerotic heart disease of native coronary artery without angina pectoris: Secondary | ICD-10-CM

## 2012-06-13 DIAGNOSIS — I359 Nonrheumatic aortic valve disorder, unspecified: Secondary | ICD-10-CM

## 2012-06-13 DIAGNOSIS — Z01810 Encounter for preprocedural cardiovascular examination: Secondary | ICD-10-CM

## 2012-06-13 HISTORY — PX: AORTIC VALVE REPLACEMENT: SHX41

## 2012-06-13 HISTORY — PX: CORONARY ARTERY BYPASS GRAFT: SHX141

## 2012-06-13 LAB — CBC
HCT: 28 % — ABNORMAL LOW (ref 39.0–52.0)
HCT: 28.9 % — ABNORMAL LOW (ref 39.0–52.0)
Hemoglobin: 9.1 g/dL — ABNORMAL LOW (ref 13.0–17.0)
Hemoglobin: 10.1 g/dL — ABNORMAL LOW (ref 13.0–17.0)
Hemoglobin: 9.8 g/dL — ABNORMAL LOW (ref 13.0–17.0)
MCH: 29 pg (ref 26.0–34.0)
MCH: 29.1 pg (ref 26.0–34.0)
MCHC: 32.5 g/dL (ref 30.0–36.0)
MCHC: 33.9 g/dL (ref 30.0–36.0)
MCV: 85.8 fL (ref 78.0–100.0)
Platelets: 192 10*3/uL (ref 150–400)
Platelets: 179 10*3/uL (ref 150–400)
RBC: 3.38 MIL/uL — ABNORMAL LOW (ref 4.22–5.81)
RBC: 3.37 MIL/uL — ABNORMAL LOW (ref 4.22–5.81)
RDW: 14.7 % (ref 11.5–15.5)
RDW: 16.6 % — ABNORMAL HIGH (ref 11.5–15.5)
WBC: 20.9 10*3/uL — ABNORMAL HIGH (ref 4.0–10.5)
WBC: 14 10*3/uL — ABNORMAL HIGH (ref 4.0–10.5)

## 2012-06-13 LAB — PROTIME-INR
INR: 1.55 — ABNORMAL HIGH (ref 0.00–1.49)
Prothrombin Time: 18.1 seconds — ABNORMAL HIGH (ref 11.6–15.2)

## 2012-06-13 LAB — POCT I-STAT 4, (NA,K, GLUC, HGB,HCT)
Glucose, Bld: 128 mg/dL — ABNORMAL HIGH (ref 70–99)
Glucose, Bld: 165 mg/dL — ABNORMAL HIGH (ref 70–99)
Glucose, Bld: 149 mg/dL — ABNORMAL HIGH (ref 70–99)
HCT: 20 % — ABNORMAL LOW (ref 39.0–52.0)
HCT: 30 % — ABNORMAL LOW (ref 39.0–52.0)
HCT: 22 % — ABNORMAL LOW (ref 39.0–52.0)
HCT: 32 % — ABNORMAL LOW (ref 39.0–52.0)
HCT: 31 % — ABNORMAL LOW (ref 39.0–52.0)
Hemoglobin: 10.2 g/dL — ABNORMAL LOW (ref 13.0–17.0)
Hemoglobin: 6.8 g/dL — CL (ref 13.0–17.0)
Hemoglobin: 7.5 g/dL — ABNORMAL LOW (ref 13.0–17.0)
Hemoglobin: 10.9 g/dL — ABNORMAL LOW (ref 13.0–17.0)
Potassium: 4.2 mEq/L (ref 3.5–5.1)
Potassium: 5.4 mEq/L — ABNORMAL HIGH (ref 3.5–5.1)
Sodium: 139 mEq/L (ref 135–145)
Sodium: 131 mEq/L — ABNORMAL LOW (ref 135–145)
Sodium: 139 mEq/L (ref 135–145)
Sodium: 140 mEq/L (ref 135–145)

## 2012-06-13 LAB — POCT I-STAT 3, ART BLOOD GAS (G3+)
Acid-base deficit: 1 mmol/L (ref 0.0–2.0)
Acid-base deficit: 8 mmol/L — ABNORMAL HIGH (ref 0.0–2.0)
Acid-base deficit: 2 mmol/L (ref 0.0–2.0)
Bicarbonate: 24.2 mEq/L — ABNORMAL HIGH (ref 20.0–24.0)
O2 Saturation: 100 %
O2 Saturation: 100 %
O2 Saturation: 97 %
TCO2: 26 mmol/L (ref 0–100)
TCO2: 25 mmol/L (ref 0–100)
TCO2: 25 mmol/L (ref 0–100)
pCO2 arterial: 43.7 mmHg (ref 35.0–45.0)
pCO2 arterial: 36.7 mmHg (ref 35.0–45.0)
pCO2 arterial: 41.6 mmHg (ref 35.0–45.0)
pH, Arterial: 7.352 (ref 7.350–7.450)
pH, Arterial: 7.277 — ABNORMAL LOW (ref 7.350–7.450)
pO2, Arterial: 252 mmHg — ABNORMAL HIGH (ref 80.0–100.0)
pO2, Arterial: 93 mmHg (ref 80.0–100.0)

## 2012-06-13 LAB — PLATELET COUNT: Platelets: 213 10*3/uL (ref 150–400)

## 2012-06-13 LAB — HEMOGLOBIN AND HEMATOCRIT, BLOOD
HCT: 20.2 % — ABNORMAL LOW (ref 39.0–52.0)
Hemoglobin: 6.7 g/dL — CL (ref 13.0–17.0)

## 2012-06-13 LAB — CREATININE, SERUM
Creatinine, Ser: 2.13 mg/dL — ABNORMAL HIGH (ref 0.50–1.35)
GFR calc Af Amer: 31 mL/min — ABNORMAL LOW (ref >90)
GFR calc non Af Amer: 27 mL/min — ABNORMAL LOW (ref >90)

## 2012-06-13 LAB — MAGNESIUM: Magnesium: 2.5 mg/dL (ref 1.5–2.5)

## 2012-06-13 LAB — RENAL FUNCTION PANEL
BUN: 58 mg/dL — ABNORMAL HIGH (ref 6–23)
Calcium: 9.3 mg/dL (ref 8.4–10.5)
Creatinine, Ser: 2.77 mg/dL — ABNORMAL HIGH (ref 0.50–1.35)
Glucose, Bld: 200 mg/dL — ABNORMAL HIGH (ref 70–99)
Phosphorus: 3.8 mg/dL (ref 2.3–4.6)

## 2012-06-13 LAB — PREPARE FRESH FROZEN PLASMA: Unit division: 0

## 2012-06-13 LAB — HEMOGLOBIN A1C
Hgb A1c MFr Bld: 6.7 % — ABNORMAL HIGH (ref <5.7)
Mean Plasma Glucose: 146 mg/dL — ABNORMAL HIGH (ref <117)

## 2012-06-13 LAB — POCT I-STAT, CHEM 8
HCT: 27 % — ABNORMAL LOW (ref 39.0–52.0)
Hemoglobin: 9.2 g/dL — ABNORMAL LOW (ref 13.0–17.0)
Potassium: 4.8 mEq/L (ref 3.5–5.1)
Sodium: 139 mEq/L (ref 135–145)

## 2012-06-13 LAB — GLUCOSE, CAPILLARY: Glucose-Capillary: 141 mg/dL — ABNORMAL HIGH (ref 70–99)

## 2012-06-13 SURGERY — CORONARY ARTERY BYPASS GRAFTING (CABG)
Anesthesia: General | Site: Chest | Wound class: Clean

## 2012-06-13 MED ORDER — MIDAZOLAM HCL 5 MG/5ML IJ SOLN
INTRAMUSCULAR | Status: DC | PRN
  Administered 2012-06-13: 1 mg via INTRAVENOUS
  Administered 2012-06-13 (×2): 2 mg via INTRAVENOUS

## 2012-06-13 MED ORDER — DOPAMINE-DEXTROSE 3.2-5 MG/ML-% IV SOLN
3.0000 ug/kg/min | INTRAVENOUS | Status: DC
Start: 2012-06-13 — End: 2012-06-15

## 2012-06-13 MED ORDER — MIDAZOLAM HCL 2 MG/2ML IJ SOLN: 2.0000 mg | INTRAMUSCULAR | Status: DC | PRN

## 2012-06-13 MED ORDER — FENTANYL CITRATE 0.05 MG/ML IJ SOLN
INTRAMUSCULAR | Status: DC | PRN
  Administered 2012-06-13: 750 ug via INTRAVENOUS
  Administered 2012-06-13: 100 ug via INTRAVENOUS
  Administered 2012-06-13: 150 ug via INTRAVENOUS
  Administered 2012-06-13 (×2): 250 ug via INTRAVENOUS

## 2012-06-13 MED ORDER — ACETAMINOPHEN 500 MG PO TABS
1000.0000 mg | ORAL_TABLET | Freq: Four times a day (QID) | ORAL | Status: DC
  Administered 2012-06-14 – 2012-06-17 (×11): 1000 mg via ORAL
  Filled 2012-06-13 (×17): qty 2

## 2012-06-13 MED ORDER — ROCURONIUM BROMIDE 100 MG/10ML IV SOLN
INTRAVENOUS | Status: DC | PRN
  Administered 2012-06-13: 20 mg via INTRAVENOUS
  Administered 2012-06-13 (×3): 50 mg via INTRAVENOUS

## 2012-06-13 MED ORDER — HEPARIN SODIUM (PORCINE) 1000 UNIT/ML IJ SOLN
INTRAMUSCULAR | Status: DC | PRN
  Administered 2012-06-13: 38000 [IU] via INTRAVENOUS

## 2012-06-13 MED ORDER — ONDANSETRON HCL 4 MG/2ML IJ SOLN
4.0000 mg | Freq: Four times a day (QID) | INTRAMUSCULAR | Status: DC | PRN
  Administered 2012-06-15: 4 mg via INTRAVENOUS
  Filled 2012-06-13: qty 2

## 2012-06-13 MED ORDER — EPINEPHRINE HCL 1 MG/ML IJ SOLN
1000.0000 ug | INTRAVENOUS | Status: DC | PRN
  Administered 2012-06-13: 3 ug/min via INTRAVENOUS

## 2012-06-13 MED ORDER — EPHEDRINE SULFATE 50 MG/ML IJ SOLN
INTRAMUSCULAR | Status: DC | PRN
  Administered 2012-06-13: 10 mg via INTRAVENOUS

## 2012-06-13 MED ORDER — MAGNESIUM SULFATE 40 MG/ML IJ SOLN: 4.0000 g | Freq: Once | INTRAMUSCULAR | Status: DC

## 2012-06-13 MED ORDER — BIOTENE DRY MOUTH MT LIQD
15.0000 mL | Freq: Four times a day (QID) | OROMUCOSAL | Status: DC
  Administered 2012-06-14 (×2): 15 mL via OROMUCOSAL

## 2012-06-13 MED ORDER — MORPHINE SULFATE 2 MG/ML IJ SOLN
1.0000 mg | INTRAMUSCULAR | Status: AC | PRN
  Administered 2012-06-13 – 2012-06-14 (×3): 2 mg via INTRAVENOUS
  Filled 2012-06-13 (×4): qty 1

## 2012-06-13 MED ORDER — SODIUM CHLORIDE 0.9 % IV SOLN
INTRAVENOUS | Status: DC
  Administered 2012-06-13: 0.7 [IU]/h via INTRAVENOUS

## 2012-06-13 MED ORDER — LACTATED RINGERS IV SOLN: INTRAVENOUS | Status: DC

## 2012-06-13 MED ORDER — MORPHINE SULFATE 2 MG/ML IJ SOLN
2.0000 mg | INTRAMUSCULAR | Status: DC | PRN
  Administered 2012-06-14 (×2): 2 mg via INTRAVENOUS
  Administered 2012-06-14: 4 mg via INTRAVENOUS
  Administered 2012-06-14 (×2): 2 mg via INTRAVENOUS
  Administered 2012-06-15: 4 mg via INTRAVENOUS
  Administered 2012-06-15: 2 mg via INTRAVENOUS
  Filled 2012-06-13: qty 1
  Filled 2012-06-13: qty 2
  Filled 2012-06-13: qty 1
  Filled 2012-06-13: qty 2
  Filled 2012-06-13: qty 1
  Filled 2012-06-13: qty 2

## 2012-06-13 MED ORDER — SODIUM CHLORIDE 0.45 % IV SOLN: INTRAVENOUS | Status: DC

## 2012-06-13 MED ORDER — SODIUM CHLORIDE 0.9 % IV SOLN
INTRAVENOUS | Status: DC
  Administered 2012-06-13: 20 mL/h via INTRAVENOUS

## 2012-06-13 MED ORDER — DEXTROSE 5 % IV SOLN
1.5000 g | Freq: Two times a day (BID) | INTRAVENOUS | Status: AC
  Administered 2012-06-13 – 2012-06-15 (×4): 1.5 g via INTRAVENOUS
  Filled 2012-06-13 (×4): qty 1.5

## 2012-06-13 MED ORDER — PROPOFOL 10 MG/ML IV BOLUS
INTRAVENOUS | Status: DC | PRN
  Administered 2012-06-13: 50 mg via INTRAVENOUS

## 2012-06-13 MED ORDER — PANTOPRAZOLE SODIUM 40 MG PO TBEC
40.0000 mg | DELAYED_RELEASE_TABLET | Freq: Every day | ORAL | Status: DC
  Administered 2012-06-15 – 2012-06-16 (×2): 40 mg via ORAL
  Filled 2012-06-13 (×3): qty 1

## 2012-06-13 MED ORDER — HEMOSTATIC AGENTS (NO CHARGE) OPTIME
TOPICAL | Status: DC | PRN
  Administered 2012-06-13: 1 via TOPICAL

## 2012-06-13 MED ORDER — ASPIRIN EC 325 MG PO TBEC
325.0000 mg | DELAYED_RELEASE_TABLET | Freq: Every day | ORAL | Status: DC
  Administered 2012-06-14 – 2012-06-16 (×3): 325 mg via ORAL
  Filled 2012-06-13 (×4): qty 1

## 2012-06-13 MED ORDER — BISACODYL 5 MG PO TBEC
10.0000 mg | DELAYED_RELEASE_TABLET | Freq: Every day | ORAL | Status: DC
  Administered 2012-06-15: 10 mg via ORAL
  Filled 2012-06-13: qty 2

## 2012-06-13 MED ORDER — ACETAMINOPHEN 10 MG/ML IV SOLN
1000.0000 mg | Freq: Once | INTRAVENOUS | Status: AC
  Administered 2012-06-13: 1000 mg via INTRAVENOUS
  Filled 2012-06-13: qty 100

## 2012-06-13 MED ORDER — AMINOCAPROIC ACID 250 MG/ML IV SOLN
INTRAVENOUS | Status: DC | PRN
  Administered 2012-06-13: 5 g via INTRAVENOUS

## 2012-06-13 MED ORDER — SODIUM CHLORIDE 0.9 % IJ SOLN: 3.0000 mL | INTRAMUSCULAR | Status: DC | PRN

## 2012-06-13 MED ORDER — DEXMEDETOMIDINE HCL IN NACL 200 MCG/50ML IV SOLN
0.1000 ug/kg/h | INTRAVENOUS | Status: DC
  Administered 2012-06-13: 0.2 ug/kg/h via INTRAVENOUS
  Filled 2012-06-13: qty 50

## 2012-06-13 MED ORDER — SODIUM CHLORIDE 0.9 % IV SOLN: 250.0000 mL | INTRAVENOUS | Status: DC

## 2012-06-13 MED ORDER — FAMOTIDINE IN NACL 20-0.9 MG/50ML-% IV SOLN
20.0000 mg | Freq: Two times a day (BID) | INTRAVENOUS | Status: AC
  Administered 2012-06-13 (×2): 20 mg via INTRAVENOUS
  Filled 2012-06-13: qty 50

## 2012-06-13 MED ORDER — POTASSIUM CHLORIDE 10 MEQ/50ML IV SOLN: 10.0000 meq | INTRAVENOUS | Status: DC

## 2012-06-13 MED ORDER — ASPIRIN 81 MG PO CHEW: 324.0000 mg | CHEWABLE_TABLET | Freq: Every day | ORAL | Status: DC

## 2012-06-13 MED ORDER — INSULIN REGULAR BOLUS VIA INFUSION
0.0000 [IU] | Freq: Three times a day (TID) | INTRAVENOUS | Status: DC
  Filled 2012-06-13: qty 10

## 2012-06-13 MED ORDER — OXYCODONE HCL 5 MG PO TABS
5.0000 mg | ORAL_TABLET | ORAL | Status: DC | PRN
  Administered 2012-06-15: 5 mg via ORAL
  Administered 2012-06-17: 10 mg via ORAL
  Filled 2012-06-13: qty 2
  Filled 2012-06-13 (×2): qty 1

## 2012-06-13 MED ORDER — NITROGLYCERIN IN D5W 200-5 MCG/ML-% IV SOLN
0.0000 ug/min | INTRAVENOUS | Status: DC
  Administered 2012-06-14: 45 ug/min via INTRAVENOUS
  Administered 2012-06-15: 60 ug/min via INTRAVENOUS
  Filled 2012-06-13 (×2): qty 250

## 2012-06-13 MED ORDER — METOPROLOL TARTRATE 1 MG/ML IV SOLN
2.5000 mg | INTRAVENOUS | Status: DC | PRN
  Administered 2012-06-14 (×2): 5 mg via INTRAVENOUS
  Filled 2012-06-13 (×2): qty 5

## 2012-06-13 MED ORDER — LACTATED RINGERS IV SOLN: 500.0000 mL | Freq: Once | INTRAVENOUS | Status: DC | PRN

## 2012-06-13 MED ORDER — VANCOMYCIN HCL IN DEXTROSE 1-5 GM/200ML-% IV SOLN
1000.0000 mg | Freq: Once | INTRAVENOUS | Status: AC
  Administered 2012-06-13: 1000 mg via INTRAVENOUS
  Filled 2012-06-13: qty 200

## 2012-06-13 MED ORDER — 0.9 % SODIUM CHLORIDE (POUR BTL) OPTIME
TOPICAL | Status: DC | PRN
  Administered 2012-06-13: 1000 mL

## 2012-06-13 MED ORDER — SODIUM CHLORIDE 0.9 % IJ SOLN
3.0000 mL | Freq: Two times a day (BID) | INTRAMUSCULAR | Status: DC
  Administered 2012-06-15 – 2012-06-17 (×5): 3 mL via INTRAVENOUS

## 2012-06-13 MED ORDER — ACETAMINOPHEN 160 MG/5ML PO SOLN
975.0000 mg | Freq: Four times a day (QID) | ORAL | Status: DC
  Administered 2012-06-14 (×2): 975 mg
  Filled 2012-06-13 (×2): qty 40.6

## 2012-06-13 MED ORDER — ALBUMIN HUMAN 5 % IV SOLN: 250.0000 mL | INTRAVENOUS | Status: AC | PRN

## 2012-06-13 MED ORDER — AMINOCAPROIC ACID 250 MG/ML IV SOLN
10.0000 g | INTRAVENOUS | Status: DC | PRN
  Administered 2012-06-13: 1 g/h via INTRAVENOUS

## 2012-06-13 MED ORDER — CHLORHEXIDINE GLUCONATE 0.12 % MT SOLN
15.0000 mL | Freq: Two times a day (BID) | OROMUCOSAL | Status: DC
  Administered 2012-06-13 – 2012-06-14 (×2): 15 mL via OROMUCOSAL
  Filled 2012-06-13: qty 15

## 2012-06-13 MED ORDER — DOCUSATE SODIUM 100 MG PO CAPS
200.0000 mg | ORAL_CAPSULE | Freq: Every day | ORAL | Status: DC
  Administered 2012-06-15 – 2012-06-16 (×2): 200 mg via ORAL
  Filled 2012-06-13 (×2): qty 2

## 2012-06-13 MED ORDER — BISACODYL 10 MG RE SUPP: 10.0000 mg | Freq: Every day | RECTAL | Status: DC

## 2012-06-13 MED ORDER — PHENYLEPHRINE HCL 10 MG/ML IJ SOLN
0.0000 ug/min | INTRAMUSCULAR | Status: DC
  Filled 2012-06-13: qty 2

## 2012-06-13 MED ORDER — PROTAMINE SULFATE 10 MG/ML IV SOLN
INTRAVENOUS | Status: DC | PRN
  Administered 2012-06-13 (×11): 20 mg via INTRAVENOUS

## 2012-06-13 MED ORDER — METOPROLOL TARTRATE 12.5 MG HALF TABLET
12.5000 mg | ORAL_TABLET | Freq: Two times a day (BID) | ORAL | Status: DC
  Administered 2012-06-14 – 2012-06-16 (×5): 12.5 mg via ORAL
  Filled 2012-06-13 (×9): qty 1

## 2012-06-13 MED ORDER — METOPROLOL TARTRATE 25 MG/10 ML ORAL SUSPENSION
12.5000 mg | Freq: Two times a day (BID) | ORAL | Status: DC
  Administered 2012-06-13: 12.5 mg
  Filled 2012-06-13 (×9): qty 5

## 2012-06-13 MED ORDER — SODIUM CHLORIDE 0.9 % IJ SOLN
OROMUCOSAL | Status: DC | PRN
  Administered 2012-06-13: 10:00:00 via TOPICAL

## 2012-06-13 MED ORDER — SODIUM BICARBONATE 4.2 % IV SOLN
INTRAVENOUS | Status: DC | PRN
  Administered 2012-06-13: 50 meq via INTRAVENOUS

## 2012-06-13 MED ORDER — SODIUM CHLORIDE 0.9 % IV SOLN
INTRAVENOUS | Status: DC | PRN
  Administered 2012-06-13 (×5): via INTRAVENOUS

## 2012-06-13 SURGICAL SUPPLY — 139 items
ADAPTER CARDIO PERF ANTE/RETRO (ADAPTER) ×2 IMPLANT
APPLICATOR COTTON TIP 6IN STRL (MISCELLANEOUS) IMPLANT
ATTRACTOMAT 16X20 MAGNETIC DRP (DRAPES) ×2 IMPLANT
BAG DECANTER FOR FLEXI CONT (MISCELLANEOUS) ×2 IMPLANT
BANDAGE ELASTIC 4 VELCRO ST LF (GAUZE/BANDAGES/DRESSINGS) ×2 IMPLANT
BANDAGE ELASTIC 6 VELCRO ST LF (GAUZE/BANDAGES/DRESSINGS) ×2 IMPLANT
BANDAGE GAUZE ELAST BULKY 4 IN (GAUZE/BANDAGES/DRESSINGS) ×2 IMPLANT
BENZOIN TINCTURE PRP APPL 2/3 (GAUZE/BANDAGES/DRESSINGS) ×2 IMPLANT
BLADE STERNUM SYSTEM 6 (BLADE) ×2 IMPLANT
BLADE SURG 11 STRL SS (BLADE) ×2 IMPLANT
BLADE SURG 15 STRL LF DISP TIS (BLADE) ×1 IMPLANT
BLADE SURG 15 STRL SS (BLADE) ×1
BLADE SURG ROTATE 9660 (MISCELLANEOUS) IMPLANT
BOOT SUTURE AID YELLOW STND (SUTURE) ×2 IMPLANT
CANISTER SUCTION 2500CC (MISCELLANEOUS) ×2 IMPLANT
CANN PRFSN .5XCNCT 15X34-48 (MISCELLANEOUS) ×1
CANNULA AORTIC HI-FLOW 6.5M20F (CANNULA) ×2 IMPLANT
CANNULA GUNDRY RCSP 15FR (MISCELLANEOUS) IMPLANT
CANNULA PRFSN .5XCNCT 15X34-48 (MISCELLANEOUS) ×1 IMPLANT
CANNULA VEN 2 STAGE (MISCELLANEOUS) ×3 IMPLANT
CANNULA VENOUS DUAL 32/40 (CANNULA) IMPLANT
CATH CPB KIT GERHARDT (MISCELLANEOUS) ×2 IMPLANT
CATH HEART VENT LEFT (CATHETERS) ×1 IMPLANT
CATH RETROPLEGIA CORONARY 14FR (CATHETERS) ×2 IMPLANT
CATH THORACIC 28FR (CATHETERS) ×2 IMPLANT
CATH THORACIC 36FR (CATHETERS) IMPLANT
CATH THORACIC 36FR RT ANG (CATHETERS) IMPLANT
CATH/SQUID NICHOLS JEHLE COR (CATHETERS) IMPLANT
CLIP TI MEDIUM 24 (CLIP) IMPLANT
CLIP TI WIDE RED SMALL 24 (CLIP) IMPLANT
CLOTH BEACON ORANGE TIMEOUT ST (SAFETY) ×2 IMPLANT
CLSR STERI-STRIP ANTIMIC 1/2X4 (GAUZE/BANDAGES/DRESSINGS) ×2 IMPLANT
CONT SPECI 4OZ STER CLIK (MISCELLANEOUS) ×2 IMPLANT
COVER MAYO STAND STRL (DRAPES) ×2 IMPLANT
COVER SURGICAL LIGHT HANDLE (MISCELLANEOUS) ×4 IMPLANT
CRADLE DONUT ADULT HEAD (MISCELLANEOUS) ×2 IMPLANT
DRAIN CHANNEL 28F RND 3/8 FF (WOUND CARE) ×2 IMPLANT
DRAIN CHANNEL 32F RND 10.7 FF (WOUND CARE) IMPLANT
DRAPE CARDIOVASCULAR INCISE (DRAPES) ×1
DRAPE SLUSH MACHINE 52X66 (DRAPES) IMPLANT
DRAPE SLUSH/WARMER DISC (DRAPES) ×2 IMPLANT
DRAPE SRG 135X102X78XABS (DRAPES) ×1 IMPLANT
DRSG COVADERM 4X14 (GAUZE/BANDAGES/DRESSINGS) ×2 IMPLANT
ELECT BLADE 4.0 EZ CLEAN MEGAD (MISCELLANEOUS) ×2
ELECT CAUTERY BLADE 6.4 (BLADE) ×2 IMPLANT
ELECT REM PT RETURN 9FT ADLT (ELECTROSURGICAL) ×4
ELECTRODE BLDE 4.0 EZ CLN MEGD (MISCELLANEOUS) ×1 IMPLANT
ELECTRODE REM PT RTRN 9FT ADLT (ELECTROSURGICAL) ×2 IMPLANT
GLOVE BIO SURGEON STRL SZ 6 (GLOVE) ×8 IMPLANT
GLOVE BIO SURGEON STRL SZ 6.5 (GLOVE) ×6 IMPLANT
GLOVE BIO SURGEON STRL SZ7 (GLOVE) IMPLANT
GLOVE BIO SURGEON STRL SZ7.5 (GLOVE) IMPLANT
GLOVE BIO SURGEON STRL SZ8 (GLOVE) ×2 IMPLANT
GLOVE BIOGEL PI IND STRL 6 (GLOVE) ×4 IMPLANT
GLOVE BIOGEL PI IND STRL 6.5 (GLOVE) ×3 IMPLANT
GLOVE BIOGEL PI IND STRL 7.0 (GLOVE) IMPLANT
GLOVE BIOGEL PI INDICATOR 6 (GLOVE) ×4
GLOVE BIOGEL PI INDICATOR 6.5 (GLOVE) ×3
GLOVE BIOGEL PI INDICATOR 7.0 (GLOVE)
GOWN EXTRA PROTECTION XL (GOWNS) ×4 IMPLANT
GOWN STRL NON-REIN LRG LVL3 (GOWN DISPOSABLE) ×10 IMPLANT
HEMOSTAT POWDER SURGIFOAM 1G (HEMOSTASIS) ×6 IMPLANT
HEMOSTAT SURGICEL 2X14 (HEMOSTASIS) ×2 IMPLANT
INSERT FOGARTY 61MM (MISCELLANEOUS) IMPLANT
INSERT FOGARTY XLG (MISCELLANEOUS) IMPLANT
KIT BASIN OR (CUSTOM PROCEDURE TRAY) ×2 IMPLANT
KIT ROOM TURNOVER OR (KITS) ×2 IMPLANT
KIT SUCTION CATH 14FR (SUCTIONS) ×4 IMPLANT
KIT VASOVIEW 6 PRO VH 2400 (KITS) ×2 IMPLANT
KIT VASOVIEW W/TROCAR VH 2000 (KITS) ×2 IMPLANT
LEAD PACING MYOCARDI (MISCELLANEOUS) ×2 IMPLANT
LINE VENT (MISCELLANEOUS) ×2 IMPLANT
MARKER GRAFT CORONARY BYPASS (MISCELLANEOUS) ×6 IMPLANT
NEEDLE 18GX1X1/2 (RX/OR ONLY) (NEEDLE) ×2 IMPLANT
NS IRRIG 1000ML POUR BTL (IV SOLUTION) ×12 IMPLANT
PACK OPEN HEART (CUSTOM PROCEDURE TRAY) ×2 IMPLANT
PAD ARMBOARD 7.5X6 YLW CONV (MISCELLANEOUS) ×4 IMPLANT
PAD DEFIB R2 (MISCELLANEOUS) ×2 IMPLANT
PENCIL BUTTON HOLSTER BLD 10FT (ELECTRODE) ×2 IMPLANT
PUNCH AORTIC ROTATE 4.0MM (MISCELLANEOUS) ×2 IMPLANT
PUNCH AORTIC ROTATE 4.5MM 8IN (MISCELLANEOUS) IMPLANT
PUNCH AORTIC ROTATE 5MM 8IN (MISCELLANEOUS) IMPLANT
SET CARDIOPLEGIA MPS 5001102 (MISCELLANEOUS) ×2 IMPLANT
SOLUTION ANTI FOG 6CC (MISCELLANEOUS) IMPLANT
SPONGE GAUZE 4X4 12PLY (GAUZE/BANDAGES/DRESSINGS) ×8 IMPLANT
SPONGE LAP 18X18 X RAY DECT (DISPOSABLE) IMPLANT
SPONGE LAP 4X18 X RAY DECT (DISPOSABLE) IMPLANT
SUT BONE WAX W31G (SUTURE) ×2 IMPLANT
SUT ETHIBON 2 0 V 52N 30 (SUTURE) ×4 IMPLANT
SUT ETHIBOND 2 0 SH (SUTURE) ×1
SUT ETHIBOND 2 0 SH 36X2 (SUTURE) ×1 IMPLANT
SUT MNCRL AB 4-0 PS2 18 (SUTURE) IMPLANT
SUT PROLENE 3 0 RB 1 (SUTURE) IMPLANT
SUT PROLENE 3 0 SH 1 (SUTURE) ×2 IMPLANT
SUT PROLENE 3 0 SH DA (SUTURE) IMPLANT
SUT PROLENE 3 0 SH1 36 (SUTURE) ×2 IMPLANT
SUT PROLENE 4 0 RB 1 (SUTURE) ×4
SUT PROLENE 4 0 SH DA (SUTURE) IMPLANT
SUT PROLENE 4 0 TF (SUTURE) ×4 IMPLANT
SUT PROLENE 4-0 RB1 .5 CRCL 36 (SUTURE) ×4 IMPLANT
SUT PROLENE 5 0 C 1 36 (SUTURE) IMPLANT
SUT PROLENE 6 0 C 1 30 (SUTURE) IMPLANT
SUT PROLENE 6 0 CC (SUTURE) ×4 IMPLANT
SUT PROLENE 7 0 BV 1 (SUTURE) IMPLANT
SUT PROLENE 7 0 BV1 MDA (SUTURE) ×2 IMPLANT
SUT PROLENE 7.0 RB 3 (SUTURE) IMPLANT
SUT PROLENE 8 0 BV175 6 (SUTURE) IMPLANT
SUT SILK  1 MH (SUTURE)
SUT SILK 1 MH (SUTURE) IMPLANT
SUT SILK 2 0 SH CR/8 (SUTURE) ×2 IMPLANT
SUT SILK 3 0 SH CR/8 (SUTURE) IMPLANT
SUT STEEL 6MS V (SUTURE) ×2 IMPLANT
SUT STEEL STERNAL CCS#1 18IN (SUTURE) IMPLANT
SUT STEEL SZ 6 DBL 3X14 BALL (SUTURE) ×4 IMPLANT
SUT VIC AB 1 CTX 18 (SUTURE) ×4 IMPLANT
SUT VIC AB 1 CTX 36 (SUTURE)
SUT VIC AB 1 CTX36XBRD ANBCTR (SUTURE) IMPLANT
SUT VIC AB 2-0 CT1 27 (SUTURE)
SUT VIC AB 2-0 CT1 TAPERPNT 27 (SUTURE) IMPLANT
SUT VIC AB 2-0 CTX 27 (SUTURE) IMPLANT
SUT VIC AB 3-0 SH 27 (SUTURE)
SUT VIC AB 3-0 SH 27X BRD (SUTURE) IMPLANT
SUT VIC AB 3-0 X1 27 (SUTURE) IMPLANT
SUT VICRYL 4-0 PS2 18IN ABS (SUTURE) IMPLANT
SUTURE E-PAK OPEN HEART (SUTURE) ×4 IMPLANT
SYSTEM SAHARA CHEST DRAIN ATS (WOUND CARE) ×2 IMPLANT
TAPE CLOTH SURG 4X10 WHT LF (GAUZE/BANDAGES/DRESSINGS) ×4 IMPLANT
TOWEL OR 17X24 6PK STRL BLUE (TOWEL DISPOSABLE) ×4 IMPLANT
TOWEL OR 17X26 10 PK STRL BLUE (TOWEL DISPOSABLE) ×4 IMPLANT
TRAY CATH LUMEN 1 20CM STRL (SET/KITS/TRAYS/PACK) ×2 IMPLANT
TRAY FOLEY IC TEMP SENS 14FR (CATHETERS) ×2 IMPLANT
TUBE FEEDING 8FR 16IN STR KANG (MISCELLANEOUS) ×2 IMPLANT
TUBE SUCT INTRACARD DLP 20F (MISCELLANEOUS) ×2 IMPLANT
TUBING ART PRESS 48 MALE/FEM (TUBING) ×4 IMPLANT
TUBING INSUFFLATION 10FT LAP (TUBING) ×2 IMPLANT
UNDERPAD 30X30 INCONTINENT (UNDERPADS AND DIAPERS) ×2 IMPLANT
VALVE MAGNA EASE 21MM (Prosthesis & Implant Heart) ×2 IMPLANT
VENT LEFT HEART 12002 (CATHETERS) ×2
WATER STERILE IRR 1000ML POUR (IV SOLUTION) ×4 IMPLANT

## 2012-06-13 NOTE — Progress Notes (Addendum)
TCTS PM rounds  S/p AVR CABG, AVR, slow to wakeup CI 1.6 on low dose dopa will a-pace 90 min-1 Urine output> 100/hr Lab Results  Component Value Date   WBC 20.9* 06/13/2012   HGB 10.5* 06/13/2012   HCT 31.0* 06/13/2012   MCV 87.3 06/13/2012   PLT 192 06/13/2012    Lab Results  Component Value Date   CREATININE 2.77* 06/13/2012   BUN 58* 06/13/2012   NA 140 06/13/2012   K 4.6 06/13/2012   CL 98 06/13/2012   CO2 23 06/13/2012

## 2012-06-13 NOTE — Progress Notes (Signed)
  Echocardiogram Echocardiogram Transesophageal has been performed.  Mauricio Po 06/13/2012, 12:00 PM

## 2012-06-13 NOTE — Progress Notes (Signed)
VASCULAR LAB PRELIMINARY  PRELIMINARY  PRELIMINARY  PRELIMINARY  Pre-op Cardiac Surgery  Carotid Findings:  Study completed at another facility.  Upper Extremity Right Left  Brachial Pressures 144 Triphasic Restricted limb Triphasic  Radial Waveforms Triphasic Triphasic  Ulnar Waveforms Triphasic Triphasic  Palmar Arch (Allen's Test) Normal Abnormal    Findings:  Doppler waveforms remained normal with both radial and ulnar compressions on the right . Left Doppler waveforms remained normal with radial compression and diminished greater than 50% with ulnar compression.    Lower  Extremity Right Left  Dorsalis Pedis 90 Monophasic 150 Monophasic       Posterior Tibial 102 Monophasic 119 Monophasic  Ankle/Brachial Indices 0.71 1.04    Findings:  Technically difficult especially on the right due to severe movement due to sensitivity. ABI on the right indicates a moderate reduction in arterial flow with normal flow on the left. Values may be falsely elevated on the left.   Collin Pearson, RVS 06/13/2012, 8:46 AM

## 2012-06-13 NOTE — Progress Notes (Signed)
Subjective: Awake without complaints, awaiting CABG and AVR Will need long term dialysis  Objective: Vital signs in last 24 hours: Temp:  [97.4 F (36.3 C)-98.3 F (36.8 C)] 97.9 F (36.6 C) (10/30 0745) Pulse Rate:  [54-59] 59  (10/30 0745) Resp:  [19] 19  (10/30 0745) BP: (126-162)/(38-59) 126/49 mmHg (10/30 0745) SpO2:  [93 %-96 %] 93 % (10/30 0745) Weight:  [63.3 kg (139 lb 8.8 oz)] 63.3 kg (139 lb 8.8 oz) (10/30 0500) Weight change: -0.3 kg (-10.6 oz) Last BM Date: 06/11/12 Intake/Output from previous day: -1815 10/29 0701 - 10/30 0700 In: 960 [P.O.:960] Out: 2775 [Urine:2775] Intake/Output this shift:    PE: General: awake and talking, family in the room Heart: Lungs: Abd: Ext:    Lab Results:  Basename 06/13/12 0440 06/12/12 0500  WBC 8.2 9.0  HGB 9.1* 9.3*  HCT 28.0* 28.4*  PLT 275 258   BMET  Basename 06/13/12 0440 06/12/12 2205  NA 131* 133*  K 4.1 4.3  CL 98 99  CO2 23 22  GLUCOSE 200* 137*  BUN 58* 57*  CREATININE 2.77* 2.65*  CALCIUM 9.3 9.5      Lab Results  Component Value Date   TSH 2.764 06/06/2012    Hepatic Function Panel  Basename 06/13/12 0440 06/12/12 2205  PROT -- 6.5  ALBUMIN 2.7* --  AST -- 42*  ALT -- 39  ALKPHOS -- 50  BILITOT -- 0.3  BILIDIR -- --  IBILI -- --     EKG: Orders placed during the hospital encounter of 06/06/12  . EKG 12-LEAD  . EKG 12-LEAD    Studies/Results: Dg Chest 2 View  06/12/2012  *RADIOLOGY REPORT*  Clinical Data: Preop coronary bypass grafting  CHEST - 2 VIEW  Comparison: Earlier portable study of the same day  Findings: Right carotid stent.  Small bilateral pleural effusions with adjacent atelectasis/consolidation in the lung bases, left greater than right.  No overt edema.  Heart size mildly enlarged. Atheromatous aortic arch.  Minimal spondylitic changes in the mid thoracic spine.  IMPRESSION:  1.  Small bilateral pleural effusions. 2.  Mild cardiomegaly   Original Report Authenticated  By: Trecia Rogers, M.D.    Dg Chest Port 1 View  06/12/2012  *RADIOLOGY REPORT*  Clinical Data: Evaluate chest tubes.  Left-sided chest pain.  Rib fractures.  PORTABLE CHEST - 1 VIEW  Comparison: Chest x-ray 06/08/2012.  Findings: Vascular stent in the lower right cervical region is incompletely visualized, but likely within the carotid artery. Lung volumes are low.  There are bibasilar opacities (left greater than right), favored to predominately reflect subsegmental atelectasis, however, sequelae of aspiration or developing infection could have a similar appearance, particularly on the left.  Trace bilateral pleural effusions.  Pulmonary vasculature is within normal limits given the low lung volumes.  Heart size is normal.  Mediastinal contours are distorted by patient rotation to the left.  Atherosclerosis of the thoracic aorta. No definite displaced rib fractures are identified on this portable examination.  No definite pneumothorax.  IMPRESSION: 1.  No chest tubes identified.  No pneumothorax. 2.  Low lung volumes with bibasilar opacities favored to represent subsegmental atelectasis, with superimposed small bilateral pleural effusions. 3.  Atherosclerosis.   Original Report Authenticated By: Etheleen Mayhew, M.D.     Medications: I have reviewed the patient's current medications.    Marland Kitchen aminocaproic acid (AMICAR) for OHS   Intravenous To OR  . aspirin EC  81 mg Oral Daily  .  atenolol  25 mg Oral Daily  . atorvastatin  40 mg Oral QHS  . bisacodyl  5 mg Oral Once  . cefUROXime (ZINACEF)  IV  1.5 g Intravenous To OR  . cefUROXime (ZINACEF)  IV  750 mg Intravenous To OR  . chlorhexidine  60 mL Topical Once  . darbepoetin (ARANESP) injection - NON-DIALYSIS  100 mcg Subcutaneous Q Tue-1800  . darifenacin  7.5 mg Oral Daily  . dexmedetomidine  0.1-0.7 mcg/kg/hr Intravenous To OR  . DOPamine  2-20 mcg/kg/min Intravenous To OR  . epinephrine  0.5-20 mcg/min Intravenous To OR  . ferrous  fumarate  106 mg of iron Oral BID  . ferumoxytol  1,020 mg Intravenous Once  . furosemide  80 mg Oral BID  . glipiZIDE  10 mg Oral Q breakfast  . heparin-papaverine-plasmalyte irrigation   Irrigation To OR  . hydrALAZINE  50 mg Oral Q8H  . insulin aspart  0-5 Units Subcutaneous QHS  . insulin aspart  0-9 Units Subcutaneous TID WC  . insulin (NOVOLIN-R) infusion   Intravenous To OR  . isosorbide mononitrate  30 mg Oral Daily  . lidocaine  1 patch Transdermal Q24H  . magnesium sulfate  40 mEq Other To OR  . metoprolol tartrate  12.5 mg Oral Once  . multivitamin with minerals  1 tablet Oral Daily  . nitroGLYCERIN  2-200 mcg/min Intravenous To OR  . omega-3 acid ethyl esters  1 g Oral TID  . pantoprazole  40 mg Oral Daily  . phenylephrine (NEO-SYNEPHRINE) Adult infusion  30-200 mcg/min Intravenous To OR  . potassium chloride  80 mEq Other To OR  . Tamsulosin HCl  0.4 mg Oral Daily  . vancomycin  1,250 mg Intravenous To OR  . DISCONTD: furosemide  20 mg Oral Daily  . DISCONTD: hydrALAZINE  25 mg Oral Q8H   Assessment/Plan: Principal Problem:  *NSTEMI (non-ST elevated myocardial infarction) Active Problems:  CAD, patent stent to LAD and mid RCA with occluded OM2 branch  05/18/12,plan for CABG/AVR  AS (aortic stenosis), critical stenosis  PVD, with hx of bilateral CEA at [facility], and previous bilateral carotid stenting, with high grade ISR of Rt. carotid 05/18/12  HTN (hypertension)  Dyslipidemia  Diabetes mellitus  Chronic anemia  Acute pulmonary edema  Rib fractures, several, left side   CKD (chronic kidney disease) stage 4, GFR 15-29 ml/min  PLAN: CABG, AVR today, carotid will be planned for after the cardiac procedure.    LOS: 7 days   Gailen Venne R 06/13/2012, 7:56 AM

## 2012-06-13 NOTE — Progress Notes (Signed)
Dr. Lucianne Lei trigt here at bedside at 2020, aware of index, pacer turned on

## 2012-06-13 NOTE — Transfer of Care (Signed)
Immediate Anesthesia Transfer of Care Note  Patient: Collin Pearson  Procedure(s) Performed: Procedure(s) (LRB) with comments: CORONARY ARTERY BYPASS GRAFTING (CABG) (N/A) - must start at 59 - appointment at [place] (AVR) (N/A)  Patient Location: SICU  Anesthesia Type:General  Level of Consciousness: Patient remains intubated per anesthesia plan  Airway & Oxygen Therapy: Patient remains intubated per anesthesia plan and Patient placed on Ventilator (see vital sign flow sheet for setting)  Post-op Assessment: Post -op Vital signs reviewed and stable  Post vital signs: Reviewed and stable  Complications: No apparent anesthesia complications

## 2012-06-13 NOTE — Plan of Care (Signed)
Problem: Phase II Progression Outcomes Goal: Patient extubated within - Outcome: Not Met (add Reason) Order to leave intubated overnight

## 2012-06-13 NOTE — Preoperative (Signed)
Beta Blockers   Reason not to administer Beta Blockers:Not Applicable 

## 2012-06-13 NOTE — Brief Op Note (Addendum)
06/06/2012 - 06/13/2012  2:02 PM  PATIENT:  Collin Pearson  78 y.o. male  PRE-OPERATIVE DIAGNOSIS:  CAD, Severe AS  POST-OPERATIVE DIAGNOSIS:  CAD, Severe AS  PROCEDURE:   CORONARY ARTERY BYPASS GRAFTING x 1 (SVG-RCA), EVH Left thigh AORTIC VALVE REPLACEMENT (20mm Magna Ease pericardial tissue valve)  SURGEON:  Surgeon(s): Grace Isaac, MD  ASSISTANT: Suzzanne Cloud, PA-C  ANESTHESIA:   general  PATIENT CONDITION:  ICU - intubated and hemodynamically stable.  PRE-OPERATIVE WEIGHT: 63.3 kg   foley, s/g, rt femoral a line, rt radial Ellin Saba MD  Beeper [phone] Office [phone] 06/13/2012 4:52 PM

## 2012-06-13 NOTE — Anesthesia Preprocedure Evaluation (Signed)
Anesthesia Evaluation  Patient identified by MRN, date of birth, ID band Patient awake    Reviewed: Allergy & Precautions, H&P , NPO status , Patient's Chart, lab work & pertinent test results  Airway Mallampati: I TM Distance: >3 FB Neck ROM: Full    Dental   Pulmonary          Cardiovascular hypertension, Pt. on medications + angina + CAD and + Past MI + Valvular Problems/Murmurs AS     Neuro/Psych    GI/Hepatic GERD-  Medicated and Controlled,  Endo/Other  diabetes  Renal/GU      Musculoskeletal   Abdominal   Peds  Hematology   Anesthesia Other Findings   Reproductive/Obstetrics                           Anesthesia Physical Anesthesia Plan  ASA: IV  Anesthesia Plan: General   Post-op Pain Management:    Induction: Intravenous  Airway Management Planned: Oral ETT  Additional Equipment: Arterial line, CVP, PA Cath, TEE and Ultrasound Guidance Line Placement  Intra-op Plan:   Post-operative Plan: Extubation in OR  Informed Consent: I have reviewed the patients History and Physical, chart, labs and discussed the procedure including the risks, benefits and alternatives for the proposed anesthesia with the patient or authorized representative who has indicated his/her understanding and acceptance.     Plan Discussed with: CRNA and Surgeon  Anesthesia Plan Comments:         Anesthesia Quick Evaluation

## 2012-06-13 NOTE — Progress Notes (Deleted)
  Echocardiogram 2D Echocardiogram has been performed.  Collin Pearson 06/13/2012, 11:59 AM

## 2012-06-13 NOTE — Progress Notes (Signed)
Subjective: Interval History: none.  Objective: Vital signs in last 24 hours: Temp:  [97.4 F (36.3 C)-98.3 F (36.8 C)] 97.9 F (36.6 C) (10/30 0745) Pulse Rate:  [54-59] 59  (10/30 0745) Resp:  [19] 19  (10/30 0745) BP: (126-162)/(38-59) 126/49 mmHg (10/30 0745) SpO2:  [93 %-96 %] 93 % (10/30 0745) Weight:  [63.3 kg (139 lb 8.8 oz)] 63.3 kg (139 lb 8.8 oz) (10/30 0500) Weight change: -0.3 kg (-10.6 oz)  Intake/Output from previous day: 10/29 0701 - 10/30 0700 In: 960 [P.O.:960] Out: 2775 [Urine:2775] Intake/Output this shift:    General appearance: alert, cooperative and pale Resp: rales bibasilar Cardio: S1, S2 normal, systolic murmur: systolic ejection 3/6, decrescendo at 2nd left intercostal space and diastolic murmur: mid diastolic 2/6, decrescendo at 2nd left intercostal space GI: pos bs, soft, liver down 3 cm Extremities: decreased DP  Lab Results:  Basename 06/13/12 0440 06/12/12 0500  WBC 8.2 9.0  HGB 9.1* 9.3*  HCT 28.0* 28.4*  PLT 275 258   BMET:  Basename 06/13/12 0440 06/12/12 2205  NA 131* 133*  K 4.1 4.3  CL 98 99  CO2 23 22  GLUCOSE 200* 137*  BUN 58* 57*  CREATININE 2.77* 2.65*  CALCIUM 9.3 9.5    Basename 06/11/12 1525  PTH 42.9   Iron Studies:  Basename 06/11/12 1525  IRON 38*  TIBC 379  TRANSFERRIN --  FERRITIN --    Studies/Results: Dg Chest 2 View  06/12/2012  *RADIOLOGY REPORT*  Clinical Data: Preop coronary bypass grafting  CHEST - 2 VIEW  Comparison: Earlier portable study of the same day  Findings: Right carotid stent.  Small bilateral pleural effusions with adjacent atelectasis/consolidation in the lung bases, left greater than right.  No overt edema.  Heart size mildly enlarged. Atheromatous aortic arch.  Minimal spondylitic changes in the mid thoracic spine.  IMPRESSION:  1.  Small bilateral pleural effusions. 2.  Mild cardiomegaly   Original Report Authenticated By: Trecia Rogers, M.D.    Dg Chest Port 1  View  06/12/2012  *RADIOLOGY REPORT*  Clinical Data: Evaluate chest tubes.  Left-sided chest pain.  Rib fractures.  PORTABLE CHEST - 1 VIEW  Comparison: Chest x-ray 06/08/2012.  Findings: Vascular stent in the lower right cervical region is incompletely visualized, but likely within the carotid artery. Lung volumes are low.  There are bibasilar opacities (left greater than right), favored to predominately reflect subsegmental atelectasis, however, sequelae of aspiration or developing infection could have a similar appearance, particularly on the left.  Trace bilateral pleural effusions.  Pulmonary vasculature is within normal limits given the low lung volumes.  Heart size is normal.  Mediastinal contours are distorted by patient rotation to the left.  Atherosclerosis of the thoracic aorta. No definite displaced rib fractures are identified on this portable examination.  No definite pneumothorax.  IMPRESSION: 1.  No chest tubes identified.  No pneumothorax. 2.  Low lung volumes with bibasilar opacities favored to represent subsegmental atelectasis, with superimposed small bilateral pleural effusions. 3.  Atherosclerosis.   Original Report Authenticated By: Etheleen Mayhew, M.D.     I have reviewed the patient's current medications.  Assessment/Plan: 1 CKD stable. Some xs vol.  For OR today. Very high risk for AKI and ESRD postop 2 AS/CAD per CVS and CARDS 3 Anemia Fe given, on EPO 4 HPTH stable 5 PVD 6 Carotid dz P OR, F/u after    LOS: 7 days   Collin Pearson L 06/13/2012,8:20 AM

## 2012-06-13 NOTE — Progress Notes (Signed)
Pt. Seen and examined. Agree with the NP/PA-C note as written.  Exam benign except for AS murmur and carotid bruit. Arterial dopplers in progress. Plan CABG/AVR today. Still has residual ISR in the carotid stent. Will continue to follow perioperatively.  Pixie Casino, MD, [facility] Attending Cardiologist [facility]

## 2012-06-14 ENCOUNTER — Inpatient Hospital Stay (HOSPITAL_COMMUNITY): Payer: PRIVATE HEALTH INSURANCE

## 2012-06-14 ENCOUNTER — Encounter (HOSPITAL_COMMUNITY): Payer: Self-pay | Admitting: Cardiothoracic Surgery

## 2012-06-14 DIAGNOSIS — Z951 Presence of aortocoronary bypass graft: Secondary | ICD-10-CM

## 2012-06-14 LAB — CBC
HCT: 30.3 % — ABNORMAL LOW (ref 39.0–52.0)
Hemoglobin: 10.1 g/dL — ABNORMAL LOW (ref 13.0–17.0)
MCH: 28.9 pg (ref 26.0–34.0)
MCHC: 33.3 g/dL (ref 30.0–36.0)
MCV: 85.9 fL (ref 78.0–100.0)
MCV: 86.8 fL (ref 78.0–100.0)
Platelets: 181 10*3/uL (ref 150–400)
Platelets: 210 10*3/uL (ref 150–400)
RBC: 3.33 MIL/uL — ABNORMAL LOW (ref 4.22–5.81)
RBC: 3.49 MIL/uL — ABNORMAL LOW (ref 4.22–5.81)
RDW: 16.6 % — ABNORMAL HIGH (ref 11.5–15.5)
RDW: 16.8 % — ABNORMAL HIGH (ref 11.5–15.5)
WBC: 13.8 10*3/uL — ABNORMAL HIGH (ref 4.0–10.5)
WBC: 19.7 10*3/uL — ABNORMAL HIGH (ref 4.0–10.5)

## 2012-06-14 LAB — PREPARE PLATELET PHERESIS: Unit division: 0

## 2012-06-14 LAB — GLUCOSE, CAPILLARY
Glucose-Capillary: 107 mg/dL — ABNORMAL HIGH (ref 70–99)
Glucose-Capillary: 109 mg/dL — ABNORMAL HIGH (ref 70–99)
Glucose-Capillary: 143 mg/dL — ABNORMAL HIGH (ref 70–99)
Glucose-Capillary: 122 mg/dL — ABNORMAL HIGH (ref 70–99)
Glucose-Capillary: 152 mg/dL — ABNORMAL HIGH (ref 70–99)

## 2012-06-14 LAB — POCT I-STAT 3, ART BLOOD GAS (G3+)
Bicarbonate: 25.1 mEq/L — ABNORMAL HIGH (ref 20.0–24.0)
TCO2: 23 mmol/L (ref 0–100)
pCO2 arterial: 43.3 mmHg (ref 35.0–45.0)
pCO2 arterial: 37.6 mmHg (ref 35.0–45.0)
pH, Arterial: 7.371 (ref 7.350–7.450)
pH, Arterial: 7.374 (ref 7.350–7.450)
pO2, Arterial: 409 mmHg — ABNORMAL HIGH (ref 80.0–100.0)

## 2012-06-14 LAB — POCT I-STAT 4, (NA,K, GLUC, HGB,HCT)
Glucose, Bld: 188 mg/dL — ABNORMAL HIGH (ref 70–99)
HCT: 27 % — ABNORMAL LOW (ref 39.0–52.0)
Hemoglobin: 9.2 g/dL — ABNORMAL LOW (ref 13.0–17.0)

## 2012-06-14 LAB — BASIC METABOLIC PANEL
CO2: 21 mEq/L (ref 19–32)
Calcium: 6.8 mg/dL — ABNORMAL LOW (ref 8.4–10.5)
Chloride: 106 mEq/L (ref 96–112)
Creatinine, Ser: 2.18 mg/dL — ABNORMAL HIGH (ref 0.50–1.35)
GFR calc Af Amer: 30 mL/min — ABNORMAL LOW (ref >90)
Sodium: 136 mEq/L (ref 135–145)

## 2012-06-14 LAB — POCT I-STAT, CHEM 8
BUN: 37 mg/dL — ABNORMAL HIGH (ref 6–23)
Chloride: 106 mEq/L (ref 96–112)
Creatinine, Ser: 2.3 mg/dL — ABNORMAL HIGH (ref 0.50–1.35)
Glucose, Bld: 187 mg/dL — ABNORMAL HIGH (ref 70–99)
Hemoglobin: 9.9 g/dL — ABNORMAL LOW (ref 13.0–17.0)
Potassium: 4.7 mEq/L (ref 3.5–5.1)

## 2012-06-14 LAB — CREATININE, SERUM
Creatinine, Ser: 2.2 mg/dL — ABNORMAL HIGH (ref 0.50–1.35)
GFR calc Af Amer: 30 mL/min — ABNORMAL LOW (ref >90)
GFR calc non Af Amer: 26 mL/min — ABNORMAL LOW (ref >90)

## 2012-06-14 LAB — MAGNESIUM
Magnesium: 2.3 mg/dL (ref 1.5–2.5)
Magnesium: 2.4 mg/dL (ref 1.5–2.5)

## 2012-06-14 MED ORDER — HYDRALAZINE HCL 20 MG/ML IJ SOLN
20.0000 mg | INTRAMUSCULAR | Status: DC | PRN
  Administered 2012-06-14: 20 mg via INTRAVENOUS
  Filled 2012-06-14 (×2): qty 1

## 2012-06-14 MED ORDER — FUROSEMIDE 10 MG/ML IJ SOLN
40.0000 mg | Freq: Once | INTRAMUSCULAR | Status: AC
  Administered 2012-06-14: 40 mg via INTRAVENOUS

## 2012-06-14 MED ORDER — INSULIN ASPART 100 UNIT/ML ~~LOC~~ SOLN: 0.0000 [IU] | SUBCUTANEOUS | Status: DC

## 2012-06-14 MED ORDER — BIOTENE DRY MOUTH MT LIQD
15.0000 mL | Freq: Two times a day (BID) | OROMUCOSAL | Status: DC
Start: 2012-06-14 — End: 2012-06-22
  Administered 2012-06-14 – 2012-06-21 (×14): 15 mL via OROMUCOSAL

## 2012-06-14 MED ORDER — DARBEPOETIN ALFA-POLYSORBATE 150 MCG/0.3ML IJ SOLN
150.0000 ug | INTRAMUSCULAR | Status: DC
  Filled 2012-06-14 (×2): qty 0.3

## 2012-06-14 MED ORDER — INSULIN ASPART 100 UNIT/ML ~~LOC~~ SOLN
0.0000 [IU] | SUBCUTANEOUS | Status: DC
  Administered 2012-06-14 (×2): 2 [IU] via SUBCUTANEOUS
  Administered 2012-06-14: 4 [IU] via SUBCUTANEOUS
  Administered 2012-06-14 (×2): 2 [IU] via SUBCUTANEOUS
  Administered 2012-06-15 (×2): 8 [IU] via SUBCUTANEOUS
  Administered 2012-06-15: 4 [IU] via SUBCUTANEOUS
  Administered 2012-06-15: 12 [IU] via SUBCUTANEOUS
  Administered 2012-06-15 – 2012-06-16 (×2): 2 [IU] via SUBCUTANEOUS
  Administered 2012-06-16: 4 [IU] via SUBCUTANEOUS
  Administered 2012-06-16 – 2012-06-17 (×2): 2 [IU] via SUBCUTANEOUS

## 2012-06-14 MED ORDER — INSULIN ASPART 100 UNIT/ML ~~LOC~~ SOLN
0.0000 [IU] | SUBCUTANEOUS | Status: DC
  Administered 2012-06-14 (×2): 2 [IU] via SUBCUTANEOUS
  Administered 2012-06-14: 4 [IU] via SUBCUTANEOUS

## 2012-06-14 MED FILL — Potassium Chloride Inj 2 mEq/ML: INTRAVENOUS | Qty: 40 | Status: AC

## 2012-06-14 MED FILL — Magnesium Sulfate Inj 50%: INTRAMUSCULAR | Qty: 10 | Status: AC
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
## 2012-06-14 NOTE — Progress Notes (Signed)
Patient ID: Collin Pearson, male   DOB: 1927-05-13, 78 y.o.   MRN: [id number]   SICU Evening Rounds:  Hypertensive requiring NTG drip. 207/70 by arterial line now. A-paced at 39.  Will start prn Hydralazine and titrate NTG upward.  Urine output good  BMET    Component Value Date/Time   NA 139 06/14/2012 1749   K 4.7 06/14/2012 1749   CL 106 06/14/2012 1749   CO2 21 06/14/2012 0405   GLUCOSE 187* 06/14/2012 1749   BUN 37* 06/14/2012 1749   CREATININE 2.30* 06/14/2012 1749   CALCIUM 6.8* 06/14/2012 0405   GFRNONAA 26* 06/14/2012 1745   GFRAA 30* 06/14/2012 1745    CBC    Component Value Date/Time   WBC 19.7* 06/14/2012 1745   RBC 3.49* 06/14/2012 1745   HGB 9.9* 06/14/2012 1749   HCT 29.0* 06/14/2012 1749   PLT 210 06/14/2012 1745   MCV 86.8 06/14/2012 1745   MCH 28.9 06/14/2012 1745   MCHC 33.3 06/14/2012 1745   RDW 16.8* 06/14/2012 1745

## 2012-06-14 NOTE — Progress Notes (Signed)
Subjective: Interval History: none.  Objective: Vital signs in last 24 hours: Temp:  [96.3 F (35.7 C)-98.2 F (36.8 C)] 96.8 F (36 C) (10/31 0600) Pulse Rate:  [59-91] 90  (10/31 0600) Resp:  [12-19] 12  (10/31 0600) BP: (126-153)/(49-66) 153/66 mmHg (10/30 2050) SpO2:  [93 %-97 %] 97 % (10/31 0600) Arterial Line BP: (108-164)/(49-71) 161/70 mmHg (10/31 0600) FiO2 (%):  [50 %] 50 % (10/31 0419) Weight change:   Intake/Output from previous day: 10/30 0701 - 10/31 0700 In: 5531.3 [I.V.:3487.3; [id number]; NG/GT:90; IV Piggyback:400] Out: 5462 [Urine:3355; Blood:1915; Chest Tube:192] Intake/Output this shift:    General appearance: slowed mentation and sedated, but awake and opens eyes and cooperates Resp: diminished breath sounds bilaterally, rales bibasilar and rhonchi bilaterally Chest wall: no tenderness, many tubes, mediastinal Cardio: S1, S2 normal, systolic murmur: systolic ejection 2/6, decrescendo at 2nd left intercostal space and paced GI: no bs, distended Extremities: edema 2 +, no pulses inDP  Lab Results:  Basename 06/14/12 0405 06/13/12 2307 06/13/12 2300  WBC 13.8* -- 14.0*  HGB 9.6* 9.2* --  HCT 28.6* 27.0* --  PLT 181 -- 179   BMET:  Basename 06/14/12 0405 06/13/12 2307 06/13/12 0440  NA 136 139 --  K 4.9 4.8 --  CL 106 105 --  CO2 21 -- 23  GLUCOSE 161* 108* --  BUN 38* 34* --  CREATININE 2.18* 2.20* --  CALCIUM 6.8* -- 9.3    Basename 06/11/12 1525  PTH 42.9   Iron Studies:  Basename 06/11/12 1525  IRON 38*  TIBC 379  TRANSFERRIN --  FERRITIN --    Studies/Results: Dg Chest 2 View  06/12/2012  *RADIOLOGY REPORT*  Clinical Data: Preop coronary bypass grafting  CHEST - 2 VIEW  Comparison: Earlier portable study of the same day  Findings: Right carotid stent.  Small bilateral pleural effusions with adjacent atelectasis/consolidation in the lung bases, left greater than right.  No overt edema.  Heart size mildly enlarged. Atheromatous  aortic arch.  Minimal spondylitic changes in the mid thoracic spine.  IMPRESSION:  1.  Small bilateral pleural effusions. 2.  Mild cardiomegaly   Original Report Authenticated By: Trecia Rogers, M.D.    Dg Chest Portable 1 View  06/13/2012  *RADIOLOGY REPORT*  Clinical Data: Status post aortic valve replacement and CABG.  .  PORTABLE CHEST - 1 VIEW  Comparison: Two-view chest 06/12/2012.  Findings: The patient is status post median sternotomy for aortic valve replacement and CABG.  The lung volumes are low.  The endotracheal tube terminates 3.6 cm above the carina, in satisfactory position.  The Swan-Ganz catheter enters via the right IJ sheath line terminates in the proximal right pulmonary artery. The mediastinal and left sided drainage tubes are in place.  An NG tube courses off the inferior border of the film.  A left pleural effusion is present.  Left greater than right basilar atelectasis is present.  There is no pneumothorax.  IMPRESSION:  1.  Status post median sternotomy for aortic valve replacement and CABG. 2.  Support apparatus is satisfactory as described. 3.  Small left pleural effusion and associated atelectasis.   Original Report Authenticated By: Resa Miner. MATTERN, M.D.     I have reviewed the patient's current medications.  Assessment/Plan: 1 CKD 4 dilution effect on Cr.  Vol xs, mild acidemia.  No indic for intervention 2 BP still on pressors 3 Resp on vent to be weaned 4 s/p AVR and CABG per Dr. Roxy Horseman 5  DM controlled 6 PVD  7 Anemia resume epo 8 HPTH P follow chem, epo, gradually reduce vol, control dm, post op care    LOS: 8 days   Chanette Demo L 06/14/2012,7:30 AM

## 2012-06-14 NOTE — Procedures (Signed)
Extubation Procedure Note  Patient Details:   Name: YOHAAN THEILE DOB: Jun 15, 1927 MRN: [id number]   Airway Documentation:     Evaluation  O2 sats: stable throughout and currently acceptable Complications: No apparent complications Patient did tolerate procedure well. Bilateral Breath Sounds: Rhonchi Suctioning: Airway Yes  NIF -80, VC 1.4.  Miquel Dunn 06/14/2012, 9:03 AM

## 2012-06-14 NOTE — Anesthesia Postprocedure Evaluation (Signed)
Anesthesia Post Note  Patient: Collin Pearson  Procedure(s) Performed: Procedure(s) (LRB): CORONARY ARTERY BYPASS GRAFTING (CABG) (N/A) AORTIC VALVE REPLACEMENT (AVR) (N/A)  Anesthesia type: General  Patient location: ICU  Post pain: Pain level controlled  Post assessment: Post-op Vital signs reviewed  Last Vitals:  Filed Vitals:   06/14/12 0700  BP:   Pulse: 90  Temp: 36 C  Resp: 11    Post vital signs: stable  Level of consciousness: Patient remains intubated per anesthesia plan  Complications: No apparent anesthesia complications

## 2012-06-14 NOTE — Progress Notes (Signed)
Patient ID: Collin BOMER, male   DOB: Dec 14, 1926, 78 y.o.   MRN: [id number] [facility] DAILY PROGRESS NOTE                   [address]            [place] [address]          [phone]      1 Day Post-Op Procedure(s) (LRB): CORONARY ARTERY BYPASS GRAFTING (CABG) (N/A) AORTIC VALVE REPLACEMENT (AVR) (N/A)  Total Length of Stay:  LOS: 8 days   Subjective: Still on vent but awake and alert , neuro intact  Objective: Vital signs in last 24 hours: Temp:  [96.3 F (35.7 C)-98.2 F (36.8 C)] 96.8 F (36 C) (10/31 0700) Pulse Rate:  [74-91] 90  (10/31 0700) Cardiac Rhythm:  [-] Atrial paced (10/30 2030) Resp:  [11-16] 11  (10/31 0700) BP: (153)/(66) 153/66 mmHg (10/30 2050) SpO2:  [93 %-97 %] 95 % (10/31 0700) Arterial Line BP: (108-164)/(49-71) 134/61 mmHg (10/31 0700) FiO2 (%):  [50 %] 50 % (10/31 0419) Weight:  [158 lb 11.7 oz (72 kg)] 158 lb 11.7 oz (72 kg) (10/31 0600)  Filed Weights   06/12/12 0400 06/13/12 0500 06/14/12 0600  Weight: 140 lb 3.4 oz (63.6 kg) 139 lb 8.8 oz (63.3 kg) 158 lb 11.7 oz (72 kg)    Weight change: 19 lb 2.9 oz (8.7 kg)   Hemodynamic parameters for last 24 hours: PAP: (30-40)/(15-27) 30/16 mmHg CO:  [2.8 L/min-3.8 L/min] 3.4 L/min CI:  [1.6 L/min/m2-2.2 L/min/m2] 2 L/min/m2  Intake/Output from previous day: 10/30 0701 - 10/31 0700 In: 5531.3 [I.V.:3487.3; [id number]; NG/GT:90; IV Piggyback:400] Out: 5462 [Urine:3355; Blood:1915; Chest Tube:192]  Intake/Output this shift:    Current Meds:  Medications Prior to Admission  Medication Sig Dispense Refill  . aspirin 81 MG chewable tablet Chew 81 mg by mouth 2 (two) times daily.      Marland Kitchen atenolol (TENORMIN) 50 MG tablet Take 25-50 mg by mouth 2 (two) times daily. Takes 50 mg in the morning and 25 mg at night      . atorvastatin (LIPITOR) 40 MG tablet Take 40 mg by mouth at bedtime.      . clopidogrel (PLAVIX) 75 MG tablet Take 75 mg by mouth daily.      .  clotrimazole-betamethasone (LOTRISONE) cream Apply 1 application topically as needed. To affected area      . epoetin alfa (EPOGEN,PROCRIT) 96295 UNIT/ML injection Inject 10,000 Units into the skin. Every 7 days if hema is below 11      . fenofibrate (TRICOR) 145 MG tablet Take 145 mg by mouth daily.      . ferrous fumarate (HEMOCYTE - 106 MG FE) 325 (106 FE) MG TABS Take 65 mg of iron by mouth 2 (two) times daily.      . fish oil-omega-3 fatty acids 1000 MG capsule Take 1 g by mouth 3 (three) times daily.      Marland Kitchen glipiZIDE (GLUCOTROL) 10 MG tablet Take 5-10 mg by mouth 2 (two) times daily before a meal. Takes 10 mg in the morning and 5 mg in the evening      . hydrALAZINE (APRESOLINE) 25 MG tablet Take 25 mg by mouth 3 (three) times daily.      . Multiple Vitamin (MULTIVITAMIN WITH MINERALS) TABS Take 1 tablet by mouth daily.      . niacin 500 MG tablet Take 500 mg by mouth 2 (two) times daily  with a meal.       . NIFEdipine (ADALAT CC) 90 MG 24 hr tablet Take 90 mg by mouth daily.      Marland Kitchen OVER THE COUNTER MEDICATION Take 0.25 tablets by mouth at bedtime. Legatrin for leg cramps      . pantoprazole (PROTONIX) 40 MG tablet Take 40 mg by mouth daily.      . solifenacin (VESICARE) 5 MG tablet Take 5 mg by mouth daily.       . Tamsulosin HCl (FLOMAX) 0.4 MG CAPS Take 0.4 mg by mouth daily.      Marland Kitchen torsemide (DEMADEX) 10 MG tablet Take 10 mg by mouth 2 (two) times daily.       Scheduled Meds:   . acetaminophen  1,000 mg Intravenous Once  . acetaminophen  1,000 mg Oral Q6H   Or  . acetaminophen (TYLENOL) oral liquid 160 mg/5 mL  975 mg Per Tube Q6H  . antiseptic oral rinse  15 mL Mouth Rinse QID  . aspirin EC  325 mg Oral Daily   Or  . aspirin  324 mg Per Tube Daily  . atorvastatin  40 mg Oral QHS  . bisacodyl  10 mg Oral Daily   Or  . bisacodyl  10 mg Rectal Daily  . cefUROXime (ZINACEF)  IV  1.5 g Intravenous To OR  . cefUROXime (ZINACEF)  IV  1.5 g Intravenous Q12H  . chlorhexidine  15 mL  Mouth Rinse BID  . darbepoetin (ARANESP) injection - DIALYSIS  150 mcg Subcutaneous Q Mon-HD  . dexmedetomidine  0.1-0.7 mcg/kg/hr Intravenous To OR  . docusate sodium  200 mg Oral Daily  . DOPamine  2-20 mcg/kg/min Intravenous To OR  . famotidine (PEPCID) IV  20 mg Intravenous Q12H  . heparin-papaverine-plasmalyte irrigation   Irrigation To OR  . insulin aspart  0-24 Units Subcutaneous Q4H  . insulin (NOVOLIN-R) infusion   Intravenous To OR  . metoprolol tartrate  12.5 mg Oral BID   Or  . metoprolol tartrate  12.5 mg Per Tube BID  . nitroGLYCERIN  2-200 mcg/min Intravenous To OR  . pantoprazole  40 mg Oral Daily  . phenylephrine (NEO-SYNEPHRINE) Adult infusion  30-200 mcg/min Intravenous To OR  . sodium chloride  3 mL Intravenous Q12H  . Tamsulosin HCl  0.4 mg Oral Daily  . vancomycin  1,250 mg Intravenous To OR  . vancomycin  1,000 mg Intravenous Once  . DISCONTD: aspirin EC  81 mg Oral Daily   Continuous Infusions:   . sodium chloride    . sodium chloride 20 mL/hr at 06/13/12 1900  . sodium chloride    . dexmedetomidine Stopped (06/14/12 0630)  . DOPamine 1 mcg/kg/min (06/13/12 1730)  . insulin (NOVOLIN-R) infusion Stopped (06/13/12 2300)  . nitroGLYCERIN 30 mcg/min (06/14/12 0650)  . phenylephrine (NEO-SYNEPHRINE) Adult infusion     PRN Meds:.albumin human, metoprolol, midazolam, morphine injection, morphine injection, ondansetron (ZOFRAN) IV, oxyCODONE, sodium chloride, DISCONTD: 0.9 % irrigation (POUR BTL), DISCONTD: acetaminophen, DISCONTD: ALPRAZolam, DISCONTD: hemostatic agents, DISCONTD: lactated ringers, DISCONTD:  morphine injection, DISCONTD: nitroGLYCERIN, DISCONTD: ondansetron (ZOFRAN) IV, DISCONTD: sodium chloride DISCONTD: Surgifoam 1 Gm with 0.9% sodium chloride (4 ml) topical solution, DISCONTD: traMADol, DISCONTD: zolpidem  General appearance: alert, cooperative and no distress Neurologic: intact Heart: regular rate and rhythm, S1, S2 normal, no murmur, click,  rub or gallop and normal apical impulse Lungs: clear to auscultation bilaterally and normal percussion bilaterally Abdomen: soft, non-tender; bowel sounds normal; no masses,  no organomegaly  Extremities: extremities normal, atraumatic, no cyanosis or edema and Homans sign is negative, no sign of DVT  Lab Results: CBC: Basename 06/14/12 0405 06/13/12 2307 06/13/12 2300  WBC 13.8* -- 14.0*  HGB 9.6* 9.2* --  HCT 28.6* 27.0* --  PLT 181 -- 179   BMET:  Basename 06/14/12 0405 06/13/12 2307 06/13/12 0440  NA 136 139 --  K 4.9 4.8 --  CL 106 105 --  CO2 21 -- 23  GLUCOSE 161* 108* --  BUN 38* 34* --  CREATININE 2.18* 2.20* --  CALCIUM 6.8* -- 9.3    PT/INR:  Basename 06/13/12 1718  LABPROT 18.1*  INR 1.55*   Radiology: Dg Chest 2 View  06/12/2012  *RADIOLOGY REPORT*  Clinical Data: Preop coronary bypass grafting  CHEST - 2 VIEW  Comparison: Earlier portable study of the same day  Findings: Right carotid stent.  Small bilateral pleural effusions with adjacent atelectasis/consolidation in the lung bases, left greater than right.  No overt edema.  Heart size mildly enlarged. Atheromatous aortic arch.  Minimal spondylitic changes in the mid thoracic spine.  IMPRESSION:  1.  Small bilateral pleural effusions. 2.  Mild cardiomegaly   Original Report Authenticated By: Trecia Rogers, M.D.    Dg Chest Portable 1 View In Am  06/14/2012  *RADIOLOGY REPORT*  Clinical Data: Postop CABG, follow-up  PORTABLE CHEST - 1 VIEW  Comparison: Portable chest x-ray of 06/13/2012  Findings: Aeration of the lungs has improved slightly.  There are still basilar opacities consistent with atelectasis and effusions, left greater than right.  The endotracheal tube appears unchanged in position with the tip approximately 4.5 cm above the carina. Swan-Ganz catheter remains.  IMPRESSION: Slightly better aeration.  Persistent basilar opacities left greater than right consistent with atelectasis and effusions.    Original Report Authenticated By: Ivar Drape, M.D.    Dg Chest Portable 1 View  06/13/2012  *RADIOLOGY REPORT*  Clinical Data: Status post aortic valve replacement and CABG.  .  PORTABLE CHEST - 1 VIEW  Comparison: Two-view chest 06/12/2012.  Findings: The patient is status post median sternotomy for aortic valve replacement and CABG.  The lung volumes are low.  The endotracheal tube terminates 3.6 cm above the carina, in satisfactory position.  The Swan-Ganz catheter enters via the right IJ sheath line terminates in the proximal right pulmonary artery. The mediastinal and left sided drainage tubes are in place.  An NG tube courses off the inferior border of the film.  A left pleural effusion is present.  Left greater than right basilar atelectasis is present.  There is no pneumothorax.  IMPRESSION:  1.  Status post median sternotomy for aortic valve replacement and CABG. 2.  Support apparatus is satisfactory as described. 3.  Small left pleural effusion and associated atelectasis.   Original Report Authenticated By: Resa Miner. MATTERN, M.D.      Assessment/Plan: S/P Procedure(s) (LRB): CORONARY ARTERY BYPASS GRAFTING (CABG) (N/A) AORTIC VALVE REPLACEMENT (AVR) (N/A) Mobilize Diuresis Diabetes control d/c tubes/lines Continue foley due to diuresing patient, strict I&O, patient critically ill, patient in ICU and urinary output monitoring See progression orders Expected Acute  Blood - loss Anemia and preop anemia  Wean and extubate   Jeiry Birnbaum B 06/14/2012 8:12 AM

## 2012-06-14 NOTE — Progress Notes (Signed)
Update via phone to Dr. Lucianne Lei trigt

## 2012-06-14 NOTE — Progress Notes (Signed)
Subjective:  Awake and alert  Objective:  Vital Signs in the last 24 hours: Temp:  [96.3 F (35.7 C)-98.2 F (36.8 C)] 96.8 F (36 C) (10/31 1100) Pulse Rate:  [74-91] 90  (10/31 1100) Resp:  [10-20] 14  (10/31 1100) BP: (153)/(66) 153/66 mmHg (10/30 2050) SpO2:  [93 %-97 %] 97 % (10/31 1100) Arterial Line BP: (108-170)/(49-71) 158/63 mmHg (10/31 1100) FiO2 (%):  [40 %-50 %] 40 % (10/31 0800) Weight:  [72 kg (158 lb 11.7 oz)] 72 kg (158 lb 11.7 oz) (10/31 0600)  Intake/Output from previous day:  Intake/Output Summary (Last 24 hours) at 06/14/12 1142 Last data filed at 06/14/12 1100  Gross per 24 hour  Intake 5730.37 ml  Output   5052 ml  Net 678.37 ml    Physical Exam: General appearance: alert, cooperative and no distress No JVD Lungs: decreased breath sounds Heart: regular rate and rhythm 1/6 sem, no rub AbD; soft, nonmtender No sig edema   Rate: 80  Rhythm: paced  Lab Results:  Basename 06/14/12 0405 06/13/12 2307 06/13/12 2300  WBC 13.8* -- 14.0*  HGB 9.6* 9.2* --  PLT 181 -- 179    Basename 06/14/12 0405 06/13/12 2307 06/13/12 0440  NA 136 139 --  K 4.9 4.8 --  CL 106 105 --  CO2 21 -- 23  GLUCOSE 161* 108* --  BUN 38* 34* --  CREATININE 2.18* 2.20* --   No results found for this basename: TROPONINI:2,CK,MB:2 in the last 72 hours Hepatic Function Panel  Basename 06/13/12 0440 06/12/12 2205  PROT -- 6.5  ALBUMIN 2.7* --  AST -- 42*  ALT -- 39  ALKPHOS -- 50  BILITOT -- 0.3  BILIDIR -- --  IBILI -- --   No results found for this basename: CHOL in the last 72 hours  Basename 06/13/12 1718  INR 1.55*    Imaging: Imaging results have been reviewed  Cardiac Studies:  Assessment/Plan:   Principal Problem:  *S/P CABG x 1 with tissue AVR 06/13/12 Active Problems:  AS (aortic stenosis), critical stenosis  NSTEMI (non-ST elevated myocardial infarction)  CAD, patent stent to LAD and mid RCA with occluded OM2 branch    PVD, with hx of  bilateral CEA at [facility], and previous bilateral carotid stenting, with high grade ISR of Rt. carotid 05/18/12  Chronic anemia  CKD (chronic kidney disease) stage 4, GFR 15-29 ml/min  HTN (hypertension)  Dyslipidemia  Diabetes mellitus   Plan-Will follow, renal function remains stable.     Kerin Ransom PA-C 06/14/2012, 11:42 AM   Patient seen and examined. Agree with assessment and plan. Day 1 s/p AVR with bovine pericardial tissue valve and CABGx1. Doing well with stable hemodynamics.   Troy Sine, MD, [facility] 06/14/2012 1:14 PM

## 2012-06-15 ENCOUNTER — Inpatient Hospital Stay (HOSPITAL_COMMUNITY): Payer: PRIVATE HEALTH INSURANCE

## 2012-06-15 ENCOUNTER — Encounter: Payer: Self-pay | Admitting: Surgery

## 2012-06-15 LAB — GLUCOSE, CAPILLARY
Glucose-Capillary: 220 mg/dL — ABNORMAL HIGH (ref 70–99)
Glucose-Capillary: 198 mg/dL — ABNORMAL HIGH (ref 70–99)
Glucose-Capillary: 174 mg/dL — ABNORMAL HIGH (ref 70–99)
Glucose-Capillary: 222 mg/dL — ABNORMAL HIGH (ref 70–99)
Glucose-Capillary: 280 mg/dL — ABNORMAL HIGH (ref 70–99)
Glucose-Capillary: 156 mg/dL — ABNORMAL HIGH (ref 70–99)
Glucose-Capillary: 121 mg/dL — ABNORMAL HIGH (ref 70–99)

## 2012-06-15 LAB — COMPREHENSIVE METABOLIC PANEL
Albumin: 2.1 g/dL — ABNORMAL LOW (ref 3.5–5.2)
Alkaline Phosphatase: 50 U/L (ref 39–117)
BUN: 44 mg/dL — ABNORMAL HIGH (ref 6–23)
Chloride: 104 mEq/L (ref 96–112)
Potassium: 4.6 mEq/L (ref 3.5–5.1)
Total Bilirubin: 0.3 mg/dL (ref 0.3–1.2)

## 2012-06-15 LAB — CBC
HCT: 27.9 % — ABNORMAL LOW (ref 39.0–52.0)
MCV: 88.3 fL (ref 78.0–100.0)
Platelets: 221 10*3/uL (ref 150–400)

## 2012-06-15 MED ORDER — TRAMADOL HCL 50 MG PO TABS
50.0000 mg | ORAL_TABLET | Freq: Three times a day (TID) | ORAL | Status: DC | PRN
  Administered 2012-06-17 – 2012-06-20 (×3): 50 mg via ORAL
  Filled 2012-06-15 (×4): qty 1

## 2012-06-15 MED ORDER — HYDRALAZINE HCL 25 MG PO TABS
25.0000 mg | ORAL_TABLET | Freq: Three times a day (TID) | ORAL | Status: DC
  Administered 2012-06-15 – 2012-06-17 (×6): 25 mg via ORAL
  Filled 2012-06-15 (×9): qty 1

## 2012-06-15 MED ORDER — FUROSEMIDE 80 MG PO TABS
80.0000 mg | ORAL_TABLET | Freq: Two times a day (BID) | ORAL | Status: DC
  Administered 2012-06-15 – 2012-06-22 (×15): 80 mg via ORAL
  Filled 2012-06-15 (×18): qty 1

## 2012-06-15 MED ORDER — TAMSULOSIN HCL 0.4 MG PO CAPS: 0.4000 mg | ORAL_CAPSULE | Freq: Every day | ORAL | Status: DC

## 2012-06-15 MED ORDER — ENOXAPARIN SODIUM 30 MG/0.3ML ~~LOC~~ SOLN
30.0000 mg | SUBCUTANEOUS | Status: DC
  Administered 2012-06-15 – 2012-06-18 (×4): 30 mg via SUBCUTANEOUS
  Filled 2012-06-15 (×6): qty 0.3

## 2012-06-15 MED ORDER — INSULIN GLARGINE 100 UNIT/ML ~~LOC~~ SOLN
15.0000 [IU] | Freq: Every day | SUBCUTANEOUS | Status: DC
  Administered 2012-06-15 – 2012-06-16 (×2): 15 [IU] via SUBCUTANEOUS

## 2012-06-15 NOTE — Evaluation (Signed)
Physical Therapy Evaluation Patient Details Name: Collin Pearson MRN: [id number] DOB: 12/07/1926 Today's Date: 06/15/2012 Time: [id number] PT Time Calculation (min): 30 min  PT Assessment / Plan / Recommendation Clinical Impression  Pt adm with MI.  Pt with fall in hospital with rib fx's and skin tear lt arm.  Pt underwent CABG and AVR on 06/13/12.  Pt limited today by lt calf cramp.  Should make good progress and be able to return home with wife.    PT Assessment  Patient needs continued PT services    Follow Up Recommendations  Home health PT;Supervision/Assistance - 24 hour    Does the patient have the potential to tolerate intense rehabilitation      Barriers to Discharge        Equipment Recommendations  Rolling walker with 5" wheels;3 in 1 bedside comode    Recommendations for Other Services OT consult   Frequency Min 3X/week    Precautions / Restrictions Restrictions Weight Bearing Restrictions: Yes (sternal precautions)   Pertinent Vitals/Pain Lt leg cramp      Mobility  Transfers Transfers: Sit to Stand;Stand to Sit Sit to Stand: 3: Mod assist;Without upper extremity assist;From chair/3-in-1 Stand to Sit: 4: Min assist;Without upper extremity assist;To chair/3-in-1 Details for Transfer Assistance: Reaching hands forward to stand and hands on knees to sit. Ambulation/Gait Ambulation/Gait Assistance: 4: Min assist Ambulation Distance (Feet): 70 Feet Assistive device: Other (Comment) (pushing w/c) Ambulation/Gait Assistance Details: verbal cues to stand more erect.  Pt had to stop 2 times to try to stretch lt calf out. Gait Pattern: Trunk flexed;Step-through pattern;Decreased stride length;Antalgic;Decreased stance time - left Gait velocity: decr    Shoulder Instructions     Exercises     PT Diagnosis: Difficulty walking;Generalized weakness;Acute pain  PT Problem List: Decreased strength;Decreased activity tolerance;Decreased balance;Decreased  mobility;Decreased knowledge of precautions;Decreased knowledge of use of DME;Pain PT Treatment Interventions: DME instruction;Gait training;Stair training;Functional mobility training;Patient/family education;Therapeutic activities;Therapeutic exercise;Balance training   PT Goals Acute Rehab PT Goals PT Goal Formulation: With patient Time For Goal Achievement: 06/22/12 Potential to Achieve Goals: Good Pt will go Supine/Side to Sit: with supervision PT Goal: Supine/Side to Sit - Progress: Goal set today Pt will go Sit to Supine/Side: with supervision PT Goal: Sit to Supine/Side - Progress: Goal set today Pt will go Sit to Stand: with supervision PT Goal: Sit to Stand - Progress: Goal set today Pt will go Stand to Sit: with supervision PT Goal: Stand to Sit - Progress: Goal set today Pt will Ambulate: >150 feet;with supervision;with least restrictive assistive device PT Goal: Ambulate - Progress: Goal set today Pt will Go Up / Down Stairs: 1-2 stairs PT Goal: Up/Down Stairs - Progress: Goal set today  Visit Information  Last PT Received On: 06/15/12 Assistance Needed: +2 (for lines)    Subjective Data  Subjective: "I like to study." Patient Stated Goal: De[place] hunting in [place] Lives With: Spouse Available Help at Discharge: Family Type of Home: House Home Access: Stairs to enter Technical brewer of Steps: 3 Entrance Stairs-Rails: Right;Left Home Layout: One level Bathroom Shower/Tub: Chiropodist: Standard Home Adaptive Equipment: None Prior Function Level of Independence: Independent Able to Take Stairs?: Yes Driving: Yes Vocation: Retired Corporate investment banker: HOH Dominant Hand: Right    Cognition  Overall Cognitive Status: Appears within functional limits for tasks assessed/performed Arousal/Alertness: Awake/alert Orientation Level: Appears intact for tasks assessed Behavior During Session: [facility]  for tasks performed  Extremity/Trunk Assessment Right Lower Extremity Assessment RLE ROM/Strength/Tone: Deficits RLE ROM/Strength/Tone Deficits: grossly 4/5 Left Lower Extremity Assessment LLE ROM/Strength/Tone: Deficits LLE ROM/Strength/Tone Deficits: grossly 4/5   Balance Static Standing Balance Static Standing - Balance Support: Bilateral upper extremity supported Static Standing - Level of Assistance: 4: Min assist  End of Session PT - End of Session Equipment Utilized During Treatment: Gait belt Activity Tolerance: Patient limited by pain (calf cramp) Patient left: in chair;with call bell/phone within reach Nurse Communication: Mobility status  GP     Nevada Kirchner 06/15/2012, 9:01 AM  Suanne Marker PT [phone]

## 2012-06-15 NOTE — Progress Notes (Signed)
Patient ID: Collin Pearson, male   DOB: August 18, 1926, 78 y.o.   MRN: [id number]                   [address]            [place] [address]          [phone]     2 Days Post-Op Procedure(s) (LRB): CORONARY ARTERY BYPASS GRAFTING (CABG) (N/A) AORTIC VALVE REPLACEMENT (AVR) (N/A)  Total Length of Stay:  LOS: 9 days  BP 116/54  Pulse 68  Temp 98.1 F (36.7 C) (Oral)  Resp 12  Ht 5' 5.5" (1.664 m)  Wt 160 lb 7.9 oz (72.8 kg)  BMI 26.30 kg/m2  SpO2 97%  .Intake/Output      10/31 0701 - 11/01 0700 11/01 0701 - 11/02 0700   P.O. 150    I.V. (mL/kg) 954.6 (13.1) 198 (2.7)   Blood     NG/GT     IV Piggyback 104    Total Intake(mL/kg) 1208.6 (16.6) 198 (2.7)   Urine (mL/kg/hr) 1715 (1) 425 (0.5)   Blood     Chest Tube 110    Total Output 1825 425   Net -616.4 -227        Stool Occurrence  1 x        . sodium chloride    . sodium chloride 20 mL/hr at 06/13/12 1900  . sodium chloride    . insulin (NOVOLIN-R) infusion Stopped (06/13/12 2300)  . nitroGLYCERIN Stopped (06/15/12 1200)  . phenylephrine (NEO-SYNEPHRINE) Adult infusion    . DISCONTD: dexmedetomidine Stopped (06/14/12 0630)  . DISCONTD: DOPamine Stopped (06/14/12 1800)     Lab Results  Component Value Date   WBC 18.1* 06/15/2012   HGB 9.2* 06/15/2012   HCT 27.9* 06/15/2012   PLT 221 06/15/2012   GLUCOSE 201* 06/15/2012   ALT 165* 06/15/2012   AST 342* 06/15/2012   NA 137 06/15/2012   K 4.6 06/15/2012   CL 104 06/15/2012   CREATININE 2.47* 06/15/2012   BUN 44* 06/15/2012   CO2 20 06/15/2012   TSH 2.764 06/06/2012   INR 1.55* 06/13/2012   HGBA1C 6.7* 06/12/2012   Stable bp better   Grace Isaac MD  Beeper [phone] Office [phone] 06/15/2012 6:12 PM

## 2012-06-15 NOTE — Progress Notes (Signed)
Subjective: Interval History: has complaints not resting well.  Objective: Vital signs in last 24 hours: Temp:  [96.6 F (35.9 C)-97.6 F (36.4 C)] 97.5 F (36.4 C) (10/31 2004) Pulse Rate:  [70-90] 79  (11/01 0600) Resp:  [8-21] 18  (11/01 0700) BP: (91-184)/(42-105) 156/46 mmHg (11/01 0700) SpO2:  [93 %-100 %] 95 % (11/01 0600) Arterial Line BP: (112-236)/(40-83) 155/53 mmHg (11/01 0700) FiO2 (%):  [40 %] 40 % (10/31 0800) Weight:  [72.8 kg (160 lb 7.9 oz)] 72.8 kg (160 lb 7.9 oz) (11/01 0500) Weight change: 0.8 kg (1 lb 12.2 oz)  Intake/Output from previous day: 10/31 0701 - 11/01 0700 In: 1208.6 [P.O.:150; I.V.:954.6; IV Piggyback:104] Out: 1825 [Urine:1715; Chest Tube:110] Intake/Output this shift:    General appearance: cooperative, pale and slowed mentation Resp: diminished breath sounds bilaterally and rales bibasilar Cardio: S1, S2 normal and systolic murmur: systolic ejection 2/6, decrescendo at 2nd left intercostal space GI: pos bs, liver down 4 cm, mild distension  Lab Results:  Basename 06/15/12 0430 06/14/12 1749 06/14/12 1745  WBC 18.1* -- 19.7*  HGB 9.2* 9.9* --  HCT 27.9* 29.0* --  PLT 221 -- 210   BMET:  Basename 06/15/12 0430 06/14/12 1749 06/14/12 0405  NA 137 139 --  K 4.6 4.7 --  CL 104 106 --  CO2 20 -- 21  GLUCOSE 201* 187* --  BUN 44* 37* --  CREATININE 2.47* 2.30* --  CALCIUM 7.9* -- 6.8*   No results found for this basename: PTH:2 in the last 72 hours Iron Studies: No results found for this basename: IRON,TIBC,TRANSFERRIN,FERRITIN in the last 72 hours  Studies/Results: Dg Chest Portable 1 View In Am  06/14/2012  *RADIOLOGY REPORT*  Clinical Data: Postop CABG, follow-up  PORTABLE CHEST - 1 VIEW  Comparison: Portable chest x-ray of 06/13/2012  Findings: Aeration of the lungs has improved slightly.  There are still basilar opacities consistent with atelectasis and effusions, left greater than right.  The endotracheal tube appears  unchanged in position with the tip approximately 4.5 cm above the carina. Swan-Ganz catheter remains.  IMPRESSION: Slightly better aeration.  Persistent basilar opacities left greater than right consistent with atelectasis and effusions.   Original Report Authenticated By: Ivar Drape, M.D.    Dg Chest Portable 1 View  06/13/2012  *RADIOLOGY REPORT*  Clinical Data: Status post aortic valve replacement and CABG.  .  PORTABLE CHEST - 1 VIEW  Comparison: Two-view chest 06/12/2012.  Findings: The patient is status post median sternotomy for aortic valve replacement and CABG.  The lung volumes are low.  The endotracheal tube terminates 3.6 cm above the carina, in satisfactory position.  The Swan-Ganz catheter enters via the right IJ sheath line terminates in the proximal right pulmonary artery. The mediastinal and left sided drainage tubes are in place.  An NG tube courses off the inferior border of the film.  A left pleural effusion is present.  Left greater than right basilar atelectasis is present.  There is no pneumothorax.  IMPRESSION:  1.  Status post median sternotomy for aortic valve replacement and CABG. 2.  Support apparatus is satisfactory as described. 3.  Small left pleural effusion and associated atelectasis.   Original Report Authenticated By: Resa Miner. MATTERN, M.D.     I have reviewed the patient's current medications.  Assessment/Plan: 1 CKD 4 vol xs, to start back Lasix,  Baseline Cr in 3s 2 Anemia given epo, fe 3 HTN given hydral, lasix 4 DM good control 5 S/P  AVR and CABG per Dr. Roxy Horseman 6 HPTH 7 PVD 8 Carotid dz P Lasix, bp meds, epo, get access    LOS: 9 days   Micheal Murad L 06/15/2012,7:52 AM

## 2012-06-15 NOTE — Progress Notes (Signed)
Subjective:  Up in chair, ambulating with PT. Rib pain is stable -- lidoderm patch helps rib fxr.  Objective:  Vital Signs in the last 24 hours: Temp:  [96.6 F (35.9 C)-98.1 F (36.7 C)] 98.1 F (36.7 C) (11/01 0758) Pulse Rate:  [70-90] 80  (11/01 0840) Resp:  [8-21] 12  (11/01 0800) BP: (91-184)/(42-105) 116/48 mmHg (11/01 0820) SpO2:  [93 %-100 %] 94 % (11/01 0840) Arterial Line BP: (128-236)/(40-83) 169/44 mmHg (11/01 0800) Weight:  [72.8 kg (160 lb 7.9 oz)] 72.8 kg (160 lb 7.9 oz) (11/01 0500)  Intake/Output from previous day:  Intake/Output Summary (Last 24 hours) at 06/15/12 0848 Last data filed at 06/15/12 0800  Gross per 24 hour  Intake 1193.93 ml  Output   1715 ml  Net -521.07 ml    Physical Exam: General appearance: alert, cooperative and no distress Lungs: decreased breath sounds Heart: regular rate and rhythm 2/6 syst murmur [id number] ND/NABS Ext: no c/c/e - L calf is sore/ cramping   Rate: 78  Rhythm: normal sinus rhythm  Lab Results:  Basename 06/15/12 0430 06/14/12 1749 06/14/12 1745  WBC 18.1* -- 19.7*  HGB 9.2* 9.9* --  PLT 221 -- 210    Basename 06/15/12 0430 06/14/12 1749 06/14/12 0405  NA 137 139 --  K 4.6 4.7 --  CL 104 106 --  CO2 20 -- 21  GLUCOSE 201* 187* --  BUN 44* 37* --  CREATININE 2.47* 2.30* --   No results found for this basename: TROPONINI:2,CK,MB:2 in the last 72 hours Hepatic Function Panel  Basename 06/15/12 0430  PROT 5.0*  ALBUMIN 2.1*  AST 342*  ALT 165*  ALKPHOS 50  BILITOT 0.3  BILIDIR --  IBILI --   No results found for this basename: CHOL in the last 72 hours  Basename 06/13/12 1718  INR 1.55*    Imaging: Imaging results have been reviewed  Cardiac Studies:  Assessment/Plan:   Principal Problem:  *S/P CABG x 1 with tissue AVR 06/13/12 Active Problems:  AS (aortic stenosis), critical stenosis  NSTEMI (non-ST elevated myocardial infarction)  CAD, patent stent to LAD and mid RCA with  occluded OM2 branch    PVD, with hx of bilateral CEA at [facility], and previous bilateral carotid stenting, with high grade ISR of Rt. carotid 05/18/12  Chronic anemia  CKD (chronic kidney disease) stage 4, GFR 15-29 ml/min  HTN (hypertension)  Dyslipidemia  Diabetes mellitus  Plan- Stable from a cardiac standpoint. His SCr has bumped today, renal following.  Kerin Ransom PA-C 06/15/2012, 8:48 AM  I have seen & examined the patient along with Mr. Rosalyn Gess, [place]. Looks great from a cardiac standpoint.  Renal function seems stable -- Cr below pre-op baseline  Only "pain" is L calf - bothered him last PM & with walk.  Was tender to palpation. - will need to keep an eye on this, K+ stable & pulse present.  DVT unlikely.  BP & HR / rhythm stable.  WBC elevated, but Afebrile -- no SSx of Infection  Anticipate waiting for redo carotid stent until recovered from CABG/AVR - no CVA Sx.  Leonie Man, M.D., M.S. [facility] [address] [place], [place]  [address]  [phone] Pager # [phone] 06/15/2012 9:07 AM

## 2012-06-15 NOTE — Progress Notes (Signed)
Patient ID: Collin Pearson, male   DOB: 01/06/1927, 78 y.o.   MRN: [id number] TCTS DAILY PROGRESS NOTE                   [address]            [place] [address]          [phone]      2 Days Post-Op Procedure(s) (LRB): CORONARY ARTERY BYPASS GRAFTING (CABG) (N/A) AORTIC VALVE REPLACEMENT (AVR) (N/A)  Total Length of Stay:  LOS: 9 days   Subjective: Awake and alert , neuro intact up in chair  Objective: Vital signs in last 24 hours: Temp:  [96.6 F (35.9 C)-97.6 F (36.4 C)] 97.5 F (36.4 C) (10/31 2004) Pulse Rate:  [70-90] 79  (11/01 0600) Cardiac Rhythm:  [-] Atrial paced (11/01 0400) Resp:  [8-21] 18  (11/01 0700) BP: (91-184)/(42-105) 156/46 mmHg (11/01 0700) SpO2:  [93 %-100 %] 95 % (11/01 0600) Arterial Line BP: (112-236)/(40-83) 155/53 mmHg (11/01 0700) FiO2 (%):  [40 %] 40 % (10/31 0800) Weight:  [160 lb 7.9 oz (72.8 kg)] 160 lb 7.9 oz (72.8 kg) (11/01 0500)  Filed Weights   06/13/12 0500 06/14/12 0600 06/15/12 0500  Weight: 139 lb 8.8 oz (63.3 kg) 158 lb 11.7 oz (72 kg) 160 lb 7.9 oz (72.8 kg)    Weight change: 1 lb 12.2 oz (0.8 kg)   Hemodynamic parameters for last 24 hours: PAP: (23-32)/(11-16) 30/11 mmHg CO:  [2.9 L/min] 2.9 L/min CI:  [1.7 L/min/m2] 1.7 L/min/m2  Intake/Output from previous day: 10/31 0701 - 11/01 0700 In: 1208.6 [P.O.:150; I.V.:954.6; IV Piggyback:104] Out: 1825 [Urine:1715; Chest Tube:110]  Intake/Output this shift:    Current Meds: Scheduled Meds:   . acetaminophen  1,000 mg Oral Q6H   Or  . acetaminophen (TYLENOL) oral liquid 160 mg/5 mL  975 mg Per Tube Q6H  . antiseptic oral rinse  15 mL Mouth Rinse BID  . aspirin EC  325 mg Oral Daily   Or  . aspirin  324 mg Per Tube Daily  . atorvastatin  40 mg Oral QHS  . bisacodyl  10 mg Oral Daily   Or  . bisacodyl  10 mg Rectal Daily  . cefUROXime (ZINACEF)  IV  1.5 g Intravenous Q12H  . darbepoetin (ARANESP) injection - DIALYSIS  150 mcg Subcutaneous  Q Mon-HD  . docusate sodium  200 mg Oral Daily  . furosemide  40 mg Intravenous Once  . insulin aspart  0-24 Units Subcutaneous Q4H  . metoprolol tartrate  12.5 mg Oral BID   Or  . metoprolol tartrate  12.5 mg Per Tube BID  . pantoprazole  40 mg Oral Daily  . sodium chloride  3 mL Intravenous Q12H  . Tamsulosin HCl  0.4 mg Oral Daily  . DISCONTD: antiseptic oral rinse  15 mL Mouth Rinse QID  . DISCONTD: chlorhexidine  15 mL Mouth Rinse BID  . DISCONTD: insulin aspart  0-24 Units Subcutaneous Q4H   Continuous Infusions:   . sodium chloride    . sodium chloride 20 mL/hr at 06/13/12 1900  . sodium chloride    . dexmedetomidine Stopped (06/14/12 0630)  . DOPamine Stopped (06/14/12 1800)  . insulin (NOVOLIN-R) infusion Stopped (06/13/12 2300)  . nitroGLYCERIN 60 mcg/min (06/15/12 0550)  . phenylephrine (NEO-SYNEPHRINE) Adult infusion     PRN Meds:.albumin human, hydrALAZINE, metoprolol, midazolam, morphine injection, ondansetron (ZOFRAN) IV, oxyCODONE, sodium chloride  General appearance: alert, cooperative and  no distress Neurologic: intact Heart: regular rate and rhythm, S1, S2 normal, no murmur, click, rub or gallop and normal apical impulse Lungs: clear to auscultation bilaterally and normal percussion bilaterally Abdomen: soft, non-tender; bowel sounds normal; no masses,  no organomegaly Extremities: extremities normal, atraumatic, no cyanosis or edema, Homans sign is negative, no sign of DVT and no edema, redness or tenderness in the calves or thighs  Lab Results: CBC: Basename 06/15/12 0430 06/14/12 1749 06/14/12 1745  WBC 18.1* -- 19.7*  HGB 9.2* 9.9* --  HCT 27.9* 29.0* --  PLT 221 -- 210   BMET:  Basename 06/15/12 0430 06/14/12 1749 06/14/12 0405  NA 137 139 --  K 4.6 4.7 --  CL 104 106 --  CO2 20 -- 21  GLUCOSE 201* 187* --  BUN 44* 37* --  CREATININE 2.47* 2.30* --  CALCIUM 7.9* -- 6.8*    PT/INR:  Basename 06/13/12 1718  LABPROT 18.1*  INR 1.55*    Radiology: Dg Chest Portable 1 View In Am  06/14/2012  *RADIOLOGY REPORT*  Clinical Data: Postop CABG, follow-up  PORTABLE CHEST - 1 VIEW  Comparison: Portable chest x-ray of 06/13/2012  Findings: Aeration of the lungs has improved slightly.  There are still basilar opacities consistent with atelectasis and effusions, left greater than right.  The endotracheal tube appears unchanged in position with the tip approximately 4.5 cm above the carina. Swan-Ganz catheter remains.  IMPRESSION: Slightly better aeration.  Persistent basilar opacities left greater than right consistent with atelectasis and effusions.   Original Report Authenticated By: Ivar Drape, M.D.    Dg Chest Portable 1 View  06/13/2012  *RADIOLOGY REPORT*  Clinical Data: Status post aortic valve replacement and CABG.  .  PORTABLE CHEST - 1 VIEW  Comparison: Two-view chest 06/12/2012.  Findings: The patient is status post median sternotomy for aortic valve replacement and CABG.  The lung volumes are low.  The endotracheal tube terminates 3.6 cm above the carina, in satisfactory position.  The Swan-Ganz catheter enters via the right IJ sheath line terminates in the proximal right pulmonary artery. The mediastinal and left sided drainage tubes are in place.  An NG tube courses off the inferior border of the film.  A left pleural effusion is present.  Left greater than right basilar atelectasis is present.  There is no pneumothorax.  IMPRESSION:  1.  Status post median sternotomy for aortic valve replacement and CABG. 2.  Support apparatus is satisfactory as described. 3.  Small left pleural effusion and associated atelectasis.   Original Report Authenticated By: Resa Miner. MATTERN, M.D.      Assessment/Plan: S/P Procedure(s) (LRB): CORONARY ARTERY BYPASS GRAFTING (CABG) (N/A) AORTIC VALVE REPLACEMENT (AVR) (N/A) Mobilize Diuresis Diabetes control, add lantus  d/c tubes/lines Continue foley due to diuresing patient, strict I&O,  patient in ICU and urinary output monitoring Plt count nl will add low dose lovenox    Brydon Spahr B 06/15/2012 7:40 AM

## 2012-06-16 ENCOUNTER — Inpatient Hospital Stay (HOSPITAL_COMMUNITY): Payer: PRIVATE HEALTH INSURANCE

## 2012-06-16 LAB — CBC
HCT: 25.2 % — ABNORMAL LOW (ref 39.0–52.0)
Hemoglobin: 8.1 g/dL — ABNORMAL LOW (ref 13.0–17.0)
MCH: 28.8 pg (ref 26.0–34.0)
MCHC: 32.1 g/dL (ref 30.0–36.0)
MCV: 89.7 fL (ref 78.0–100.0)
Platelets: 161 10*3/uL (ref 150–400)
RBC: 2.81 MIL/uL — ABNORMAL LOW (ref 4.22–5.81)
RDW: 16.2 % — ABNORMAL HIGH (ref 11.5–15.5)
WBC: 11.2 10*3/uL — ABNORMAL HIGH (ref 4.0–10.5)

## 2012-06-16 LAB — TYPE AND SCREEN
ABO/RH(D): A POS
Antibody Screen: NEGATIVE
Unit division: 0
Unit division: 0
Unit division: 0
Unit division: 0
Unit division: 0
Unit division: 0

## 2012-06-16 LAB — BASIC METABOLIC PANEL
BUN: 50 mg/dL — ABNORMAL HIGH (ref 6–23)
CO2: 22 mEq/L (ref 19–32)
Calcium: 7.7 mg/dL — ABNORMAL LOW (ref 8.4–10.5)
Chloride: 104 mEq/L (ref 96–112)
Creatinine, Ser: 2.66 mg/dL — ABNORMAL HIGH (ref 0.50–1.35)
GFR calc Af Amer: 24 mL/min — ABNORMAL LOW (ref >90)
GFR calc non Af Amer: 21 mL/min — ABNORMAL LOW (ref >90)
Glucose, Bld: 111 mg/dL — ABNORMAL HIGH (ref 70–99)
Potassium: 3.9 mEq/L (ref 3.5–5.1)
Sodium: 138 mEq/L (ref 135–145)

## 2012-06-16 LAB — GLUCOSE, CAPILLARY
Glucose-Capillary: 135 mg/dL — ABNORMAL HIGH (ref 70–99)
Glucose-Capillary: 117 mg/dL — ABNORMAL HIGH (ref 70–99)

## 2012-06-16 NOTE — Progress Notes (Signed)
Patient ID: Collin Pearson, male   DOB: [birth year], 78 y.o.   MRN: [id number]                   [address]            [place] [address]          [phone]     3 Days Post-Op Procedure(s) (LRB): CORONARY ARTERY BYPASS GRAFTING (CABG) (N/A) AORTIC VALVE REPLACEMENT (AVR) (N/A)  Total Length of Stay:  LOS: 10 days  BP 141/72  Pulse 72  Temp 98.2 F (36.8 C) (Oral)  Resp 14  Ht 5' 5.5" (1.664 m)  Wt 162 lb 14.4 oz (73.891 kg)  BMI 26.70 kg/m2  SpO2 95%  .Intake/Output      11/02 0701 - 11/03 0700   P.O. 780   I.V. (mL/kg) 100 (1.4)   IV Piggyback    Total Intake(mL/kg) 880 (11.9)   Urine (mL/kg/hr) 1430 (1.6)   Total Output 1430   Net -550            . sodium chloride    . sodium chloride 20 mL/hr at 06/13/12 1900  . sodium chloride    . insulin (NOVOLIN-R) infusion Stopped (06/13/12 2300)  . nitroGLYCERIN Stopped (06/15/12 1200)  . phenylephrine (NEO-SYNEPHRINE) Adult infusion       Lab Results  Component Value Date   WBC 11.2* 06/16/2012   HGB 8.1* 06/16/2012   HCT 25.2* 06/16/2012   PLT 161 06/16/2012   GLUCOSE 111* 06/16/2012   ALT 165* 06/15/2012   AST 342* 06/15/2012   NA 138 06/16/2012   K 3.9 06/16/2012   CL 104 06/16/2012   CREATININE 2.66* 06/16/2012   BUN 50* 06/16/2012   CO2 22 06/16/2012   TSH 2.764 06/06/2012   INR 1.55* 06/13/2012   HGBA1C 6.7* 06/12/2012   Stable day   Grace Isaac MD  Beeper [phone] Office [phone] 06/16/2012 7:20 PM

## 2012-06-16 NOTE — Progress Notes (Signed)
Days Post-Op: #3  Procedure(s) (LRB):  CORONARY ARTERY BYPASS GRAFTING (CABG) (N/A)  AORTIC VALVE REPLACEMENT (AVR) (N/A)  Subjective:  Resting comfortably Had elevated BP & CBG issues yesterday -- BP meds titrated & Diet changed, now stable Rib pain is stable -- lidoderm patch helps rib fxr. L calf cramping improved with "tonic water"  Objective:  Vital Signs in the last 24 hours: Temp:  [97.8 F (36.6 C)-98.1 F (36.7 C)] 97.9 F (36.6 C) (11/02 0000) Pulse Rate:  [64-106] 64  (11/02 0600) Resp:  [10-19] 12  (11/02 0600) BP: (107-160)/(46-84) 114/57 mmHg (11/02 0600) SpO2:  [92 %-99 %] 93 % (11/02 0600) Arterial Line BP: (155-169)/(44-53) 169/44 mmHg (11/01 0800)  Intake/Output from previous day:  Intake/Output Summary (Last 24 hours) at 06/16/12 [id number] Last data filed at 06/16/12 0600  Gross per 24 hour  Intake    598 ml  Output   1150 ml  Net   -552 ml    Physical Exam: General appearance: alert, cooperative and no distress Lungs: decreased breath sounds Heart: regular rate and rhythm 2/6 syst murmur [id number] ND/NABS Ext: no c/c/trace to 1+ LEE; L calf is less sore/ cramping   Rate: 78  Rhythm: normal sinus rhythm  Lab Results:  Basename 06/16/12 0423 06/15/12 0430  WBC 11.2* 18.1*  HGB 8.1* 9.2*  PLT 161 221    Basename 06/16/12 0423 06/15/12 0430  NA 138 137  K 3.9 4.6  CL 104 104  CO2 22 20  GLUCOSE 111* 201*  BUN 50* 44*  CREATININE 2.66* 2.47*  Sight trend up -- still below his baseline of 2.9   No results found for this basename: CHOL in the last 72 hours  Basename 06/13/12 1718  INR 1.55*   Imaging: Pending CXR  Assessment/Plan:   Principal Problem:  *S/P CABG x 1 with tissue AVR 06/13/12 Active Problems:  CAD, patent stent to LAD and mid RCA with occluded OM2 branch    AS (aortic stenosis), critical stenosis  PVD, with hx of bilateral CEA at [facility], and previous bilateral carotid stenting, with high grade ISR of Rt. carotid  05/18/12  HTN (hypertension)  Dyslipidemia  Diabetes mellitus  NSTEMI (non-ST elevated myocardial infarction)  Chronic anemia  CKD (chronic kidney disease) stage 4, GFR 15-29 ml/min  Looks great from a cardiac standpoint.  Renal function seems stable -- Cr below pre-op baseline  BP & HR / rhythm stable: Was on multiple BP meds @ home -- can probably titrate up BB dose to 25 bid, is on home dose of Hydralazine.  Agree with Metoprolol > Atenolol with CKD4  Glycemic control is stable.  Only "pain" is L calf - bothered him last PM & with walk.  Was tender to palpation. - will need to keep an eye on this, K+ stable & pulse present.  DVT unlikely.  Hgb trending down -- now anemic @ 8.1, no sign of frank bleed -- defer to CT Sgx & Nephrology, but consider Iron supplementation along with Aranesp.                         WBC improving, Afebrile -- no SSx of Infection  On statin, would restart fibrate on d/c  Anticipate waiting for redo carotid stent until recovered from CABG/AVR - no CVA Sx.  Leonie Man, M.D., M.S. [facility] [address] [place], [place]  [address]  [phone] Pager # [phone] 06/16/2012 6:32 AM

## 2012-06-16 NOTE — Progress Notes (Signed)
Patient ID: Collin Pearson, male   DOB: 15-Aug-1927, 78 y.o.   MRN: [id number] TCTS DAILY PROGRESS NOTE                   [address]            [place] [address]          [phone]      3 Days Post-Op Procedure(s) (LRB): CORONARY ARTERY BYPASS GRAFTING (CABG) (N/A) AORTIC VALVE REPLACEMENT (AVR) (N/A)  Total Length of Stay:  LOS: 10 days   Subjective: Up in chair watching tv  Objective: Vital signs in last 24 hours: Temp:  [97.7 F (36.5 C)-98.1 F (36.7 C)] 97.7 F (36.5 C) (11/02 0759) Pulse Rate:  [64-106] 65  (11/02 0800) Cardiac Rhythm:  [-] Normal sinus rhythm (11/02 0600) Resp:  [10-19] 15  (11/02 0800) BP: (107-160)/(47-76) 121/54 mmHg (11/02 0800) SpO2:  [92 %-99 %] 94 % (11/02 0800) Weight:  [162 lb 14.4 oz (73.891 kg)] 162 lb 14.4 oz (73.891 kg) (11/02 0800)  Filed Weights   06/14/12 0600 06/15/12 0500 06/16/12 0800  Weight: 158 lb 11.7 oz (72 kg) 160 lb 7.9 oz (72.8 kg) 162 lb 14.4 oz (73.891 kg)    Weight change:    Hemodynamic parameters for last 24 hours:    Intake/Output from previous day: 11/01 0701 - 11/02 0700 In: 640 [P.O.:180; I.V.:458; IV Piggyback:2] Out: 1050 [Urine:1050]  Intake/Output this shift:    Current Meds: Scheduled Meds:   . acetaminophen  1,000 mg Oral Q6H   Or  . acetaminophen (TYLENOL) oral liquid 160 mg/5 mL  975 mg Per Tube Q6H  . antiseptic oral rinse  15 mL Mouth Rinse BID  . aspirin EC  325 mg Oral Daily   Or  . aspirin  324 mg Per Tube Daily  . atorvastatin  40 mg Oral QHS  . bisacodyl  10 mg Oral Daily   Or  . bisacodyl  10 mg Rectal Daily  . cefUROXime (ZINACEF)  IV  1.5 g Intravenous Q12H  . darbepoetin (ARANESP) injection - DIALYSIS  150 mcg Subcutaneous Q Mon-HD  . docusate sodium  200 mg Oral Daily  . enoxaparin  30 mg Subcutaneous Q24H  . furosemide  80 mg Oral BID  . hydrALAZINE  25 mg Oral Q8H  . insulin aspart  0-24 Units Subcutaneous Q4H  . insulin glargine  15 Units  Subcutaneous Daily  . metoprolol tartrate  12.5 mg Oral BID   Or  . metoprolol tartrate  12.5 mg Per Tube BID  . pantoprazole  40 mg Oral Daily  . sodium chloride  3 mL Intravenous Q12H  . Tamsulosin HCl  0.4 mg Oral Daily   Continuous Infusions:   . sodium chloride    . sodium chloride 20 mL/hr at 06/13/12 1900  . sodium chloride    . insulin (NOVOLIN-R) infusion Stopped (06/13/12 2300)  . nitroGLYCERIN Stopped (06/15/12 1200)  . phenylephrine (NEO-SYNEPHRINE) Adult infusion     PRN Meds:.hydrALAZINE, metoprolol, ondansetron (ZOFRAN) IV, oxyCODONE, sodium chloride, traMADol, DISCONTD: midazolam, DISCONTD:  morphine injection  General appearance: alert, cooperative and no distress Neurologic: intact Heart: regular rate and rhythm, S1, S2 normal, no murmur, click, rub or gallop and normal apical impulse Lungs: clear to auscultation bilaterally and normal percussion bilaterally Abdomen: soft, non-tender; bowel sounds normal; no masses,  no organomegaly Extremities: edema both lower legs with edema, doppler pulses present Wound: sternum stable  Lab Results: CBC:  Basename 06/16/12 0423 06/15/12 0430  WBC 11.2* 18.1*  HGB 8.1* 9.2*  HCT 25.2* 27.9*  PLT 161 221   BMET:  Basename 06/16/12 0423 06/15/12 0430  NA 138 137  K 3.9 4.6  CL 104 104  CO2 22 20  GLUCOSE 111* 201*  BUN 50* 44*  CREATININE 2.66* 2.47*  CALCIUM 7.7* 7.9*     Lab Results  Component Value Date   ALT 165* 06/15/2012   AST 342* 06/15/2012   ALKPHOS 50 06/15/2012   BILITOT 0.3 06/15/2012   PT/INR:  Basename 06/13/12 1718  LABPROT 18.1*  INR 1.55*   Radiology: Dg Chest Port 1 View  06/16/2012  *RADIOLOGY REPORT*  Clinical Data: Postop from CABG.  PORTABLE CHEST - 1 VIEW  Comparison: 06/15/2012  Findings: The left basilar atelectasis shows no significant change. Right lung is clear.  No evidence of pneumothorax.  Heart size is stable.  Right jugular Cordis remains in place.  The patient has undergone  CABG and aortic valve replacement.  IMPRESSION: Left lower lobe atelectasis, without significant change.   Original Report Authenticated By: Earle Gell, M.D.    Dg Chest Portable 1 View In Am  06/15/2012  *RADIOLOGY REPORT*  Clinical Data: Post CABG, follow-up  PORTABLE CHEST - 1 VIEW  Comparison: Portable chest x-ray of 06/14/2012  Findings: The Swan-Ganz catheter, endotracheal tube and NG tube have been removed.  Aeration has improved somewhat.  There is persistent opacity at the left lung base most consistent with atelectasis and left effusion.  Cardiomegaly is stable.  A venous sheath is noted in the SVC.  No pneumothorax is seen.  IMPRESSION: Endotracheal tube and Swan-Ganz catheter removed.  Slightly better aeration.  Persistent left basilar opacity.   Original Report Authenticated By: Ivar Drape, M.D.      Assessment/Plan: S/P Procedure(s) (LRB): CORONARY ARTERY BYPASS GRAFTING (CABG) (N/A) AORTIC VALVE REPLACEMENT (AVR) (N/A) Mobilize Diuresis Continue foley due to diuresing patient, patient in ICU and urinary output monitoring Hold statin with elevated alt and ast   Collin Pearson B 06/16/2012 9:45 AM

## 2012-06-16 NOTE — Progress Notes (Signed)
Subjective: Interval History: none.  Objective: Vital signs in last 24 hours: Temp:  [97.7 F (36.5 C)-98.1 F (36.7 C)] 97.7 F (36.5 C) (11/02 0759) Pulse Rate:  [64-106] 65  (11/02 0700) Resp:  [10-19] 13  (11/02 0700) BP: (107-160)/(47-84) 141/55 mmHg (11/02 0700) SpO2:  [92 %-99 %] 94 % (11/02 0700) Weight change:   Intake/Output from previous day: 11/01 0701 - 11/02 0700 In: 640 [P.O.:180; I.V.:458; IV Piggyback:2] Out: 1050 [Urine:1050] Intake/Output this shift:    General appearance: alert, cooperative and pale Resp: diminished breath sounds bilaterally and rales bibasilar Cardio: S1, S2 normal and systolic murmur: systolic ejection 2/6, decrescendo at 2nd left intercostal space GI: pos bs, distended, liver down 5 cm Extremities: edema 3+  Lab Results:  Basename 06/16/12 0423 06/15/12 0430  WBC 11.2* 18.1*  HGB 8.1* 9.2*  HCT 25.2* 27.9*  PLT 161 221   BMET:  Basename 06/16/12 0423 06/15/12 0430  NA 138 137  K 3.9 4.6  CL 104 104  CO2 22 20  GLUCOSE 111* 201*  BUN 50* 44*  CREATININE 2.66* 2.47*  CALCIUM 7.7* 7.9*   No results found for this basename: PTH:2 in the last 72 hours Iron Studies: No results found for this basename: IRON,TIBC,TRANSFERRIN,FERRITIN in the last 72 hours  Studies/Results: Dg Chest Portable 1 View In Am  06/15/2012  *RADIOLOGY REPORT*  Clinical Data: Post CABG, follow-up  PORTABLE CHEST - 1 VIEW  Comparison: Portable chest x-ray of 06/14/2012  Findings: The Swan-Ganz catheter, endotracheal tube and NG tube have been removed.  Aeration has improved somewhat.  There is persistent opacity at the left lung base most consistent with atelectasis and left effusion.  Cardiomegaly is stable.  A venous sheath is noted in the SVC.  No pneumothorax is seen.  IMPRESSION: Endotracheal tube and Swan-Ganz catheter removed.  Slightly better aeration.  Persistent left basilar opacity.   Original Report Authenticated By: Ivar Drape, M.D.     I have  reviewed the patient's current medications.  Assessment/Plan: 1 CKD 4-5, vol xs, diuresing with Lasix.  Mild acidemia.  Needs access next week. 2 Anemia drifting down HB.  On EPO and got Fe bolus 3 HPTH 4 AVR & CABG per CTS 5 Carotid dz 6 PVD 7 DM controlled P epo, Lasix, access    LOS: 10 days   Shaida Route L 06/16/2012,8:08 AM

## 2012-06-17 LAB — RENAL FUNCTION PANEL
Albumin: 1.9 g/dL — ABNORMAL LOW (ref 3.5–5.2)
BUN: 52 mg/dL — ABNORMAL HIGH (ref 6–23)
CO2: 25 mEq/L (ref 19–32)
Calcium: 8.2 mg/dL — ABNORMAL LOW (ref 8.4–10.5)
Chloride: 102 mEq/L (ref 96–112)
Creatinine, Ser: 2.71 mg/dL — ABNORMAL HIGH (ref 0.50–1.35)
GFR calc Af Amer: 23 mL/min — ABNORMAL LOW (ref >90)
GFR calc non Af Amer: 20 mL/min — ABNORMAL LOW (ref >90)
Glucose, Bld: 79 mg/dL (ref 70–99)
Phosphorus: 3.7 mg/dL (ref 2.3–4.6)
Potassium: 3.7 mEq/L (ref 3.5–5.1)
Sodium: 136 mEq/L (ref 135–145)

## 2012-06-17 LAB — GLUCOSE, CAPILLARY
Glucose-Capillary: 67 mg/dL — ABNORMAL LOW (ref 70–99)
Glucose-Capillary: 96 mg/dL (ref 70–99)
Glucose-Capillary: 131 mg/dL — ABNORMAL HIGH (ref 70–99)

## 2012-06-17 LAB — CBC
MCHC: 32.5 g/dL (ref 30.0–36.0)
Platelets: 195 10*3/uL (ref 150–400)
RDW: 16 % — ABNORMAL HIGH (ref 11.5–15.5)
WBC: 10.5 10*3/uL (ref 4.0–10.5)

## 2012-06-17 MED ORDER — SODIUM CHLORIDE 0.9 % IJ SOLN: 3.0000 mL | INTRAMUSCULAR | Status: DC | PRN

## 2012-06-17 MED ORDER — PANTOPRAZOLE SODIUM 40 MG PO TBEC
40.0000 mg | DELAYED_RELEASE_TABLET | Freq: Every day | ORAL | Status: DC
  Administered 2012-06-18 – 2012-06-22 (×5): 40 mg via ORAL
  Filled 2012-06-17 (×4): qty 1

## 2012-06-17 MED ORDER — OXYCODONE HCL 5 MG PO TABS
ORAL_TABLET | ORAL | Status: AC
  Administered 2012-06-17: 5 mg
  Filled 2012-06-17: qty 1

## 2012-06-17 MED ORDER — BISACODYL 5 MG PO TBEC
10.0000 mg | DELAYED_RELEASE_TABLET | Freq: Every day | ORAL | Status: DC | PRN
  Administered 2012-06-17 – 2012-06-20 (×2): 10 mg via ORAL
  Filled 2012-06-17 (×2): qty 2

## 2012-06-17 MED ORDER — INSULIN ASPART 100 UNIT/ML ~~LOC~~ SOLN
0.0000 [IU] | Freq: Three times a day (TID) | SUBCUTANEOUS | Status: DC
  Administered 2012-06-17: 2 [IU] via SUBCUTANEOUS
  Administered 2012-06-17 – 2012-06-18 (×3): 4 [IU] via SUBCUTANEOUS
  Administered 2012-06-18 – 2012-06-19 (×2): 2 [IU] via SUBCUTANEOUS
  Administered 2012-06-19 (×2): 4 [IU] via SUBCUTANEOUS
  Administered 2012-06-20 (×2): 2 [IU] via SUBCUTANEOUS
  Administered 2012-06-20: 8 [IU] via SUBCUTANEOUS
  Administered 2012-06-21: 4 [IU] via SUBCUTANEOUS
  Administered 2012-06-21 – 2012-06-22 (×3): 2 [IU] via SUBCUTANEOUS

## 2012-06-17 MED ORDER — ONDANSETRON HCL 4 MG/2ML IJ SOLN: 4.0000 mg | Freq: Four times a day (QID) | INTRAMUSCULAR | Status: DC | PRN

## 2012-06-17 MED ORDER — ONDANSETRON HCL 4 MG PO TABS: 4.0000 mg | ORAL_TABLET | Freq: Four times a day (QID) | ORAL | Status: DC | PRN

## 2012-06-17 MED ORDER — OXYCODONE-ACETAMINOPHEN 5-325 MG PO TABS
1.0000 | ORAL_TABLET | ORAL | Status: DC | PRN
Start: 2012-06-17 — End: 2012-06-17

## 2012-06-17 MED ORDER — OXYCODONE HCL 5 MG PO TABS
5.0000 mg | ORAL_TABLET | ORAL | Status: DC | PRN
Start: 2012-06-17 — End: 2012-06-22
  Administered 2012-06-18 – 2012-06-19 (×2): 5 mg via ORAL
  Filled 2012-06-17 (×2): qty 1

## 2012-06-17 MED ORDER — MOVING RIGHT ALONG BOOK
Freq: Once | Status: AC
  Administered 2012-06-19
  Filled 2012-06-17: qty 1

## 2012-06-17 MED ORDER — HYDRALAZINE HCL 25 MG PO TABS
25.0000 mg | ORAL_TABLET | Freq: Two times a day (BID) | ORAL | Status: DC
  Administered 2012-06-17 – 2012-06-19 (×5): 25 mg via ORAL
  Filled 2012-06-17 (×7): qty 1

## 2012-06-17 MED ORDER — SODIUM CHLORIDE 0.9 % IJ SOLN
3.0000 mL | Freq: Two times a day (BID) | INTRAMUSCULAR | Status: DC
  Administered 2012-06-17 – 2012-06-21 (×9): 3 mL via INTRAVENOUS

## 2012-06-17 MED ORDER — OXYCODONE HCL 5 MG PO TABS: 5.0000 mg | ORAL_TABLET | ORAL | Status: DC | PRN

## 2012-06-17 MED ORDER — METOPROLOL TARTRATE 25 MG PO TABS
25.0000 mg | ORAL_TABLET | Freq: Two times a day (BID) | ORAL | Status: DC
  Filled 2012-06-17 (×2): qty 1

## 2012-06-17 MED ORDER — BISACODYL 10 MG RE SUPP
10.0000 mg | Freq: Every day | RECTAL | Status: DC | PRN
  Filled 2012-06-17: qty 1

## 2012-06-17 MED ORDER — METOPROLOL TARTRATE 12.5 MG HALF TABLET
12.5000 mg | ORAL_TABLET | Freq: Two times a day (BID) | ORAL | Status: DC
  Administered 2012-06-17 – 2012-06-22 (×11): 12.5 mg via ORAL
  Filled 2012-06-17 (×12): qty 1

## 2012-06-17 MED ORDER — SODIUM CHLORIDE 0.9 % IV SOLN: 250.0000 mL | INTRAVENOUS | Status: DC | PRN

## 2012-06-17 MED ORDER — ASPIRIN EC 325 MG PO TBEC
325.0000 mg | DELAYED_RELEASE_TABLET | Freq: Every day | ORAL | Status: DC
  Administered 2012-06-17 – 2012-06-22 (×6): 325 mg via ORAL
  Filled 2012-06-17 (×6): qty 1

## 2012-06-17 NOTE — Progress Notes (Signed)
Patient ID: Collin Pearson, male   DOB: 09-06-1926, 78 y.o.   MRN: [id number] TCTS DAILY PROGRESS NOTE                   [address]            [place] [address]          [phone]      4 Days Post-Op Procedure(s) (LRB): CORONARY ARTERY BYPASS GRAFTING (CABG) (N/A) AORTIC VALVE REPLACEMENT (AVR) (N/A)  Total Length of Stay:  LOS: 11 days   Subjective: Feels well, walked 300 feet this morning  Objective: Vital signs in last 24 hours: Temp:  [98.2 F (36.8 C)-98.5 F (36.9 C)] 98.5 F (36.9 C) (11/03 0753) Pulse Rate:  [64-77] 72  (11/03 0600) Cardiac Rhythm:  [-] Normal sinus rhythm (11/03 0600) Resp:  [12-19] 12  (11/03 0600) BP: (106-147)/(48-78) 121/61 mmHg (11/03 0600) SpO2:  [92 %-98 %] 93 % (11/03 0600) Weight:  [159 lb 6.3 oz (72.3 kg)] 159 lb 6.3 oz (72.3 kg) (11/03 0600)  Filed Weights   06/15/12 0500 06/16/12 0800 06/17/12 0600  Weight: 160 lb 7.9 oz (72.8 kg) 162 lb 14.4 oz (73.891 kg) 159 lb 6.3 oz (72.3 kg)    Weight change:    Hemodynamic parameters for last 24 hours:    Intake/Output from previous day: 11/02 0701 - 11/03 0700 In: 880 [P.O.:780; I.V.:100] Out: 2555 [Urine:2555]  Intake/Output this shift:    Current Meds: Scheduled Meds:   . acetaminophen  1,000 mg Oral Q6H   Or  . acetaminophen (TYLENOL) oral liquid 160 mg/5 mL  975 mg Per Tube Q6H  . antiseptic oral rinse  15 mL Mouth Rinse BID  . aspirin EC  325 mg Oral Daily   Or  . aspirin  324 mg Per Tube Daily  . bisacodyl  10 mg Oral Daily   Or  . bisacodyl  10 mg Rectal Daily  . darbepoetin (ARANESP) injection - DIALYSIS  150 mcg Subcutaneous Q Mon-HD  . docusate sodium  200 mg Oral Daily  . enoxaparin  30 mg Subcutaneous Q24H  . furosemide  80 mg Oral BID  . hydrALAZINE  25 mg Oral BID  . insulin aspart  0-24 Units Subcutaneous Q4H  . insulin glargine  15 Units Subcutaneous Daily  . metoprolol tartrate  25 mg Oral BID  . pantoprazole  40 mg Oral Daily    . sodium chloride  3 mL Intravenous Q12H  . Tamsulosin HCl  0.4 mg Oral Daily  . [DISCONTINUED] atorvastatin  40 mg Oral QHS  . [DISCONTINUED] hydrALAZINE  25 mg Oral Q8H  . [DISCONTINUED] metoprolol tartrate  12.5 mg Per Tube BID  . [DISCONTINUED] metoprolol tartrate  12.5 mg Oral BID   Continuous Infusions:   . sodium chloride    . sodium chloride 20 mL/hr at 06/13/12 1900  . sodium chloride    . insulin (NOVOLIN-R) infusion Stopped (06/13/12 2300)  . nitroGLYCERIN Stopped (06/15/12 1200)  . phenylephrine (NEO-SYNEPHRINE) Adult infusion     PRN Meds:.hydrALAZINE, metoprolol, ondansetron (ZOFRAN) IV, oxyCODONE, sodium chloride, traMADol  General appearance: alert and cooperative Neurologic: intact Heart: regular rate and rhythm, S1, S2 normal, no murmur, click, rub or gallop and normal apical impulse Lungs: clear to auscultation bilaterally and normal percussion bilaterally Abdomen: soft, non-tender; bowel sounds normal; no masses,  no organomegaly Extremities: extremities normal, atraumatic, no cyanosis or edema and Homans sign is negative, no sign of DVT  Wound: stenum stable Left leg tenderness much improved  Lab Results: CBC: Basename 06/17/12 0400 06/16/12 0423  WBC 10.5 11.2*  HGB 8.8* 8.1*  HCT 27.1* 25.2*  PLT 195 161   BMET:  Basename 06/17/12 0400 06/16/12 0423  NA 136 138  K 3.7 3.9  CL 102 104  CO2 25 22  GLUCOSE 79 111*  BUN 52* 50*  CREATININE 2.71* 2.66*  CALCIUM 8.2* 7.7*    PT/INR: No results found for this basename: LABPROT,INR in the last 72 hours Radiology: Dg Chest Port 1 View  06/16/2012  *RADIOLOGY REPORT*  Clinical Data: Postop from CABG.  PORTABLE CHEST - 1 VIEW  Comparison: 06/15/2012  Findings: The left basilar atelectasis shows no significant change. Right lung is clear.  No evidence of pneumothorax.  Heart size is stable.  Right jugular Cordis remains in place.  The patient has undergone CABG and aortic valve replacement.  IMPRESSION:  Left lower lobe atelectasis, without significant change.   Original Report Authenticated By: Earle Gell, M.D.      Assessment/Plan: S/P Procedure(s) (LRB): CORONARY ARTERY BYPASS GRAFTING (CABG) (N/A) AORTIC VALVE REPLACEMENT (AVR) (N/A) Mobilize Diuresis Diabetes control Plan for transfer to step-down: see transfer orders Decrease insulin    Brittanyann Wittner B 06/17/2012 8:27 AM

## 2012-06-17 NOTE — Progress Notes (Signed)
Subjective: Interval History: has complaints no bm in 2 d.  Objective: Vital signs in last 24 hours: Temp:  [98.2 F (36.8 C)-98.5 F (36.9 C)] 98.5 F (36.9 C) (11/03 0753) Pulse Rate:  [64-77] 72  (11/03 0600) Resp:  [12-19] 12  (11/03 0600) BP: (106-147)/(48-78) 121/61 mmHg (11/03 0600) SpO2:  [92 %-98 %] 93 % (11/03 0600) Weight:  [72.3 kg (159 lb 6.3 oz)] 72.3 kg (159 lb 6.3 oz) (11/03 0600) Weight change:   Intake/Output from previous day: 11/02 0701 - 11/03 0700 In: 880 [P.O.:780; I.V.:100] Out: 2555 [Urine:2555] Intake/Output this shift:    General appearance: alert, cooperative and pale Resp: diminished breath sounds bilaterally and rales bibasilar Cardio: S1, S2 normal and systolic murmur: systolic ejection 2/6, decrescendo at 2nd left intercostal space GI: pos bs,liver down 4 cm,  distended Extremities: edema 2+  Lab Results:  Basename 06/17/12 0400 06/16/12 0423  WBC 10.5 11.2*  HGB 8.8* 8.1*  HCT 27.1* 25.2*  PLT 195 161   BMET:  Basename 06/17/12 0400 06/16/12 0423  NA 136 138  K 3.7 3.9  CL 102 104  CO2 25 22  GLUCOSE 79 111*  BUN 52* 50*  CREATININE 2.71* 2.66*  CALCIUM 8.2* 7.7*   No results found for this basename: PTH:2 in the last 72 hours Iron Studies: No results found for this basename: IRON,TIBC,TRANSFERRIN,FERRITIN in the last 72 hours  Studies/Results: Dg Chest Port 1 View  06/16/2012  *RADIOLOGY REPORT*  Clinical Data: Postop from CABG.  PORTABLE CHEST - 1 VIEW  Comparison: 06/15/2012  Findings: The left basilar atelectasis shows no significant change. Right lung is clear.  No evidence of pneumothorax.  Heart size is stable.  Right jugular Cordis remains in place.  The patient has undergone CABG and aortic valve replacement.  IMPRESSION: Left lower lobe atelectasis, without significant change.   Original Report Authenticated By: Earle Gell, M.D.     I have reviewed the patient's current medications.  Assessment/Plan: 1 CKD stable,  diuresing, still vol xs,goal lose 1-2 lb/d Acid/base/K ok. 2 HTN better with diuresis 3 Anemia epo, got Fe 4 HPTH 5 s/p AVR and CABG per CTS 6 PVD/carotid dz P ACCESS on Tues or Wed. Carotid, lasix , lower hydral    LOS: 11 days   Collin Pearson L 06/17/2012,8:07 AM

## 2012-06-17 NOTE — Progress Notes (Signed)
Days Post-Op: #4  Procedure(s) (LRB):  CORONARY ARTERY BYPASS GRAFTING (CABG) (N/A)  AORTIC VALVE REPLACEMENT (AVR) (N/A)  Subjective:  Resting comfortably in chair. Had a good day last PM & best night yet.  Has walked around the ICU.  Objective:  Vital Signs in the last 24 hours: Temp:  [97.7 F (36.5 C)-98.5 F (36.9 C)] 98.3 F (36.8 C) (11/03 0400) Pulse Rate:  [64-77] 77  (11/03 0500) Resp:  [12-19] 14  (11/03 0500) BP: (106-147)/(48-78) 147/61 mmHg (11/03 0500) SpO2:  [92 %-98 %] 94 % (11/03 0500) Weight:  [73.891 kg (162 lb 14.4 oz)] 73.891 kg (162 lb 14.4 oz) (11/02 0800)  Intake/Output from previous day:  Intake/Output Summary (Last 24 hours) at 06/17/12 [id number] Last data filed at 06/17/12 0500  Gross per 24 hour  Intake    960 ml  Output   2480 ml  Net  -1520 ml   Physical Exam: General appearance: alert, cooperative and no distress, pleasant mood & affect Lungs: decreased breath sounds but non-labored. Heart: regular rate and rhythm 2/6 syst murmur [id number] ND/NABS Ext: no c/c/trace to 1-2+ LEE; L calf is less sore/ cramping   Rate: 74  Rhythm: normal sinus rhythm, 9 Beats of VT @ 0330  Lab Results:  Basename 06/17/12 0400 06/16/12 0423  WBC 10.5 11.2*  HGB 8.8* 8.1*  PLT 195 161    Basename 06/17/12 0400 06/16/12 0423  NA 136 138  K 3.7 3.9  CL 102 104  CO2 25 22  GLUCOSE 79 111*  BUN 52* 50*  CREATININE 2.71* 2.66*  Sight trend up -- still below his baseline of 2.9  Imaging: Pending CXR  Assessment/Plan:   Principal Problem:  *S/P CABG x 1 with tissue AVR 06/13/12 Active Problems:  CAD, patent stent to LAD and mid RCA with occluded OM2 branch    AS (aortic stenosis), critical stenosis  PVD, with hx of bilateral CEA at [facility], and previous bilateral carotid stenting, with high grade ISR of Rt. carotid 05/18/12  HTN (hypertension)  Dyslipidemia  Diabetes mellitus  NSTEMI (non-ST elevated myocardial infarction)  Chronic anemia  CKD  (chronic kidney disease) stage 4, GFR 15-29 ml/min  Looks great from a cardiac standpoint.  With the exception of a 9-beat run of VT  BP & HR / rhythm stable: Was on multiple BP meds @ home -- can probably titrate up BB dose to 25 bid, is on home dose of Hydralazine.  Agree with Metoprolol, will increase dose in light of VT episode.   Renal function with Cr increasing; still diuresing well with good UOP.  K+ drifting down. Will defer diuresis strategy to Nephrology as he is not actively in CHF Sx, but does have edema.  EF was "normal" pre-cath.   Hgb actually back up to 8.8 (? If yesterday's reading of 8.1 was spurious).  WBC improving, Afebrile -- no SSx of Infection.  Sugars trending down -- may need to back off Lantus dose.  On statin& fibrate -- would restart on d/c  Anticipate waiting for redo carotid stent until recovered from CABG/AVR - no CVA Sx. Will need post AVR Echo for new baseline especially if VT recurs, but can be done as OP.  Leonie Man, M.D., M.S. [facility] [address] [place], [place]  [address]  [phone] Pager # [phone] 06/17/2012 6:21 AM

## 2012-06-18 ENCOUNTER — Encounter (HOSPITAL_COMMUNITY): Payer: Self-pay | Admitting: Cardiology

## 2012-06-18 ENCOUNTER — Ambulatory Visit: Payer: Medicare Other | Admitting: Surgery

## 2012-06-18 ENCOUNTER — Other Ambulatory Visit: Payer: Self-pay

## 2012-06-18 DIAGNOSIS — I480 Paroxysmal atrial fibrillation: Secondary | ICD-10-CM

## 2012-06-18 DIAGNOSIS — N186 End stage renal disease: Secondary | ICD-10-CM

## 2012-06-18 HISTORY — DX: Paroxysmal atrial fibrillation: I48.0

## 2012-06-18 LAB — GLUCOSE, CAPILLARY
Glucose-Capillary: 144 mg/dL — ABNORMAL HIGH (ref 70–99)
Glucose-Capillary: 162 mg/dL — ABNORMAL HIGH (ref 70–99)
Glucose-Capillary: 165 mg/dL — ABNORMAL HIGH (ref 70–99)

## 2012-06-18 LAB — CBC
HCT: 27.5 % — ABNORMAL LOW (ref 39.0–52.0)
Hemoglobin: 8.8 g/dL — ABNORMAL LOW (ref 13.0–17.0)
MCH: 28.7 pg (ref 26.0–34.0)
MCHC: 32 g/dL (ref 30.0–36.0)
MCV: 89.6 fL (ref 78.0–100.0)
Platelets: 258 10*3/uL (ref 150–400)
RBC: 3.07 MIL/uL — ABNORMAL LOW (ref 4.22–5.81)
RDW: 16.1 % — ABNORMAL HIGH (ref 11.5–15.5)
WBC: 13.2 10*3/uL — ABNORMAL HIGH (ref 4.0–10.5)

## 2012-06-18 LAB — COMPREHENSIVE METABOLIC PANEL
ALT: 129 U/L — ABNORMAL HIGH (ref 0–53)
AST: 132 U/L — ABNORMAL HIGH (ref 0–37)
Albumin: 2 g/dL — ABNORMAL LOW (ref 3.5–5.2)
Alkaline Phosphatase: 62 U/L (ref 39–117)
BUN: 54 mg/dL — ABNORMAL HIGH (ref 6–23)
CO2: 22 mEq/L (ref 19–32)
Calcium: 8.5 mg/dL (ref 8.4–10.5)
Chloride: 102 mEq/L (ref 96–112)
Creatinine, Ser: 2.65 mg/dL — ABNORMAL HIGH (ref 0.50–1.35)
GFR calc Af Amer: 24 mL/min — ABNORMAL LOW (ref >90)
GFR calc non Af Amer: 21 mL/min — ABNORMAL LOW (ref >90)
Glucose, Bld: 127 mg/dL — ABNORMAL HIGH (ref 70–99)
Potassium: 3.8 mEq/L (ref 3.5–5.1)
Sodium: 137 mEq/L (ref 135–145)
Total Bilirubin: 0.5 mg/dL (ref 0.3–1.2)
Total Protein: 5.6 g/dL — ABNORMAL LOW (ref 6.0–8.3)

## 2012-06-18 LAB — PHOSPHORUS: Phosphorus: 3.5 mg/dL (ref 2.3–4.6)

## 2012-06-18 MED ORDER — AMIODARONE HCL IN DEXTROSE 360-4.14 MG/200ML-% IV SOLN
0.5000 mg/min | INTRAVENOUS | Status: DC
  Filled 2012-06-18 (×10): qty 200

## 2012-06-18 MED ORDER — AMIODARONE HCL 200 MG PO TABS
400.0000 mg | ORAL_TABLET | Freq: Two times a day (BID) | ORAL | Status: DC
  Administered 2012-06-18 – 2012-06-21 (×7): 400 mg via ORAL
  Filled 2012-06-18 (×9): qty 2

## 2012-06-18 MED ORDER — AMIODARONE HCL 200 MG PO TABS: 400.0000 mg | ORAL_TABLET | Freq: Every day | ORAL | Status: DC

## 2012-06-18 MED ORDER — AMIODARONE LOAD VIA INFUSION
150.0000 mg | Freq: Once | INTRAVENOUS | Status: AC
  Administered 2012-06-18: 150 mg via INTRAVENOUS
  Filled 2012-06-18: qty 83.34

## 2012-06-18 MED ORDER — DARBEPOETIN ALFA-POLYSORBATE 150 MCG/0.3ML IJ SOLN
150.0000 ug | INTRAMUSCULAR | Status: DC
  Administered 2012-06-18: 150 ug via SUBCUTANEOUS
  Filled 2012-06-18: qty 0.3

## 2012-06-18 MED ORDER — AMIODARONE HCL IN DEXTROSE 360-4.14 MG/200ML-% IV SOLN
1.0000 mg/min | INTRAVENOUS | Status: AC
  Administered 2012-06-18 (×2): 1 mg/min via INTRAVENOUS
  Filled 2012-06-18: qty 200

## 2012-06-18 NOTE — Progress Notes (Addendum)
5 Days Post-Op Procedure(s) (LRB): CORONARY ARTERY BYPASS GRAFTING (CABG) (N/A) AORTIC VALVE REPLACEMENT (AVR) (N/A) Subjective:  Mr. Collin Pearson developed Atrial Fibrillation this morning.  His leg pain has improved.   Objective: Vital signs in last 24 hours: Temp:  [97.7 F (36.5 C)-99.3 F (37.4 C)] 98.1 F (36.7 C) (11/04 0424) Pulse Rate:  [69-79] 69  (11/04 0653) Cardiac Rhythm:  [-] Normal sinus rhythm (11/04 0750) Resp:  [13-18] 18  (11/04 0424) BP: (98-152)/(50-58) 120/56 mmHg (11/04 0653) SpO2:  [96 %-98 %] 98 % (11/04 0424) Weight:  [160 lb (72.576 kg)] 160 lb (72.576 kg) (11/04 0424)    Intake/Output from previous day: 11/03 0701 - 11/04 0700 In: 760 [P.O.:760] Out: 1285 [Urine:1285]  General appearance: alert, cooperative and no distress Heart: irregularly irregular rhythm Lungs: diminished breath sounds bilaterally Abdomen: soft, non-tender; bowel sounds normal; no masses,  no organomegaly Extremities: edema 1-2+ Wound: clean and dry  Lab Results:  Basename 06/18/12 0519 06/17/12 0400  WBC 13.2* 10.5  HGB 8.8* 8.8*  HCT 27.5* 27.1*  PLT 258 195   BMET:  Basename 06/18/12 0519 06/17/12 0400  NA 137 136  K 3.8 3.7  CL 102 102  CO2 22 25  GLUCOSE 127* 79  BUN 54* 52*  CREATININE 2.65* 2.71*  CALCIUM 8.5 8.2*    PT/INR: No results found for this basename: LABPROT,INR in the last 72 hours ABG    Component Value Date/Time   PHART 7.374 06/14/2012 0841   HCO3 21.9 06/14/2012 0841   TCO2 19 06/14/2012 1749   ACIDBASEDEF 3.0* 06/14/2012 0841   O2SAT 96.0 06/14/2012 0841   CBG (last 3)   Basename 06/17/12 2353 06/17/12 2014 06/17/12 1730  GLUCAP 144* 179* 115*    Assessment/Plan: S/P Procedure(s) (LRB): CORONARY ARTERY BYPASS GRAFTING (CABG) (N/A) AORTIC VALVE REPLACEMENT (AVR) (N/A)  1. CV- Rapid Atrial Fibrillation this morning- on Amiodarone and Lopressor 2. Renal- chronic renal insufficiency, creatine stable, nephrology following 3. Acute  post operative anemia- stable 4. DM- CBGS controlled 5. Volume Overload- continue diuresis 6. Dispo- continue current care    LOS: 12 days    BARRETT, ERIN 06/18/2012  No back in sinus Cr/bun at baseline I have seen and examined Collin Pearson and agree with the above assessment  and plan.  Grace Isaac MD Beeper [phone] Office [phone] 06/18/2012 2:13 PM

## 2012-06-18 NOTE — Progress Notes (Signed)
PT Cancellation Note  Patient Details Name: Collin Pearson MRN: [id number] DOB: [birth year]   Cancelled Treatment:     Pt out of the room ?procedure on arrival x2.   Unable to get back today.  Will see as able 06/19/12. 06/18/2012  Donnella Sham, PT [phone] (pager)    Ninoska Goswick, Tessie Fass 06/18/2012, 5:20 PM

## 2012-06-18 NOTE — Progress Notes (Deleted)
5 Days Post-Op Procedure(s) (LRB): CORONARY ARTERY BYPASS GRAFTING (CABG) (N/A) AORTIC VALVE REPLACEMENT (AVR) (N/A) Subjective:  Mr. Gaal continues to complain of nausea and diarrhea.  He states this had not improved much, but states he is not eating a lot of food and is mostly intaking liquids  Objective: Vital signs in last 24 hours: Temp:  [97.7 F (36.5 C)-99.3 F (37.4 C)] 98.1 F (36.7 C) (11/04 0424) Pulse Rate:  [69-79] 69  (11/04 0653) Cardiac Rhythm:  [-] Normal sinus rhythm (11/04 0750) Resp:  [13-18] 18  (11/04 0424) BP: (98-152)/(45-58) 120/56 mmHg (11/04 0653) SpO2:  [93 %-98 %] 98 % (11/04 0424) Weight:  [160 lb (72.576 kg)] 160 lb (72.576 kg) (11/04 0424)  Intake/Output from previous day: 11/03 0701 - 11/04 0700 In: 760 [P.O.:760] Out: 1285 [Urine:1285]  General appearance: alert, cooperative and no distress Heart: regular rate and rhythm Lungs: clear to auscultation bilaterally Abdomen: soft, non-tender; bowel sounds normal; no masses,  no organomegaly Extremities: edema 1-2+ Wound: clean and dry  Lab Results:  Basename 06/18/12 0519 06/17/12 0400  WBC 13.2* 10.5  HGB 8.8* 8.8*  HCT 27.5* 27.1*  PLT 258 195   BMET:  Basename 06/18/12 0519 06/17/12 0400  NA 137 136  K 3.8 3.7  CL 102 102  CO2 22 25  GLUCOSE 127* 79  BUN 54* 52*  CREATININE 2.65* 2.71*  CALCIUM 8.5 8.2*    PT/INR: No results found for this basename: LABPROT,INR in the last 72 hours ABG    Component Value Date/Time   PHART 7.374 06/14/2012 0841   HCO3 21.9 06/14/2012 0841   TCO2 19 06/14/2012 1749   ACIDBASEDEF 3.0* 06/14/2012 0841   O2SAT 96.0 06/14/2012 0841   CBG (last 3)   Basename 06/17/12 2353 06/17/12 2014 06/17/12 1730  GLUCAP 144* 179* 115*    Assessment/Plan: S/P Procedure(s) (LRB): CORONARY ARTERY BYPASS GRAFTING (CABG) (N/A) AORTIC VALVE REPLACEMENT (AVR) (N/A)  1. CV- Previous Atrial Fibrillation, NSR currently on Lopressor and IV Amiodarone 2.  Resp- no acute issues, continue IS 3. Renal- CKD, creatinine appears to be at baseline 4. Nausea- Zofran prn, will decrease dose of Amiodarone 5. Cbgs- controlled, patient not a diabetic will d/c fingersticks and sliding scale 6. Dispo- patient with continued nausea and diarrhea, on IV Amiodarone this morning  LOS: 12 days    Zakkary Thibault 06/18/2012

## 2012-06-18 NOTE — Progress Notes (Signed)
5 Days Post-Op  Procedure(s) (LRB):  CORONARY ARTERY BYPASS GRAFTING (CABG) (N/A)  AORTIC VALVE REPLACEMENT   Subjective:   Objective: Vital signs in last 24 hours: Temp:  [97.7 F (36.5 C)-99.3 F (37.4 C)] 98.1 F (36.7 C) (11/04 0424) Pulse Rate:  [69-79] 69  (11/04 0653) Resp:  [13-18] 18  (11/04 0424) BP: (98-152)/(45-58) 120/56 mmHg (11/04 0653) SpO2:  [93 %-98 %] 98 % (11/04 0424) Weight:  [72.576 kg (160 lb)] 72.576 kg (160 lb) (11/04 0424) Weight change: -1.315 kg (-2 lb 14.4 oz) Last BM Date: 06/17/12 Intake/Output from previous day: -525 11/03 0701 - 11/04 0700 In: 760 [P.O.:760] Out: 1285 [Urine:1285] Intake/Output this shift:    PE: General: Heart: Lungs: Abd: Ext:    Lab Results:  Basename 06/18/12 0519 06/17/12 0400  WBC 13.2* 10.5  HGB 8.8* 8.8*  HCT 27.5* 27.1*  PLT 258 195   BMET  Basename 06/18/12 0519 06/17/12 0400  NA 137 136  K 3.8 3.7  CL 102 102  CO2 22 25  GLUCOSE 127* 79  BUN 54* 52*  CREATININE 2.65* 2.71*  CALCIUM 8.5 8.2*   Lab Results  Component Value Date   HGBA1C 6.7* 06/12/2012     Lab Results  Component Value Date   TSH 2.764 06/06/2012    Hepatic Function Panel  Basename 06/18/12 0519  PROT 5.6*  ALBUMIN 2.0*  AST 132*  ALT 129*  ALKPHOS 62  BILITOT 0.5  BILIDIR --  IBILI --    EKG: Orders placed during the hospital encounter of 06/06/12  . EKG 12-LEAD  . EKG 12-LEAD  . EKG 12-LEAD  . EKG 12-LEAD    Studies/Results: No results found.  Medications: I have reviewed the patient's current medications.    . [COMPLETED] amiodarone  150 mg Intravenous Once  . amiodarone  400 mg Oral Q12H   Followed by  . amiodarone  400 mg Oral Daily  . antiseptic oral rinse  15 mL Mouth Rinse BID  . aspirin EC  325 mg Oral Daily  . darbepoetin (ARANESP) injection - DIALYSIS  150 mcg Subcutaneous Q Mon-HD  . enoxaparin  30 mg Subcutaneous Q24H  . furosemide  80 mg Oral BID  . hydrALAZINE  25 mg Oral BID  .  insulin aspart  0-24 Units Subcutaneous TID AC & HS  . metoprolol tartrate  12.5 mg Oral BID  . moving right along book   Does not apply Once  . [COMPLETED] oxyCODONE      . pantoprazole  40 mg Oral Q1200  . sodium chloride  3 mL Intravenous Q12H  . Tamsulosin HCl  0.4 mg Oral Daily  . [DISCONTINUED] acetaminophen (TYLENOL) oral liquid 160 mg/5 mL  975 mg Per Tube Q6H  . [DISCONTINUED] acetaminophen  1,000 mg Oral Q6H  . [DISCONTINUED] aspirin  324 mg Per Tube Daily  . [DISCONTINUED] aspirin EC  325 mg Oral Daily  . [DISCONTINUED] bisacodyl  10 mg Oral Daily  . [DISCONTINUED] bisacodyl  10 mg Rectal Daily  . [DISCONTINUED] docusate sodium  200 mg Oral Daily  . [DISCONTINUED] insulin aspart  0-24 Units Subcutaneous Q4H  . [DISCONTINUED] insulin glargine  15 Units Subcutaneous Daily  . [DISCONTINUED] metoprolol tartrate  25 mg Oral BID  . [DISCONTINUED] pantoprazole  40 mg Oral Daily  . [DISCONTINUED] sodium chloride  3 mL Intravenous Q12H   Assessment/Plan: Principal Problem:  *S/P CABG x 1 with tissue AVR 06/13/12 Active Problems:  CAD, patent stent to LAD  and mid RCA with occluded OM2 branch    AS (aortic stenosis), critical stenosis  PVD, with hx of bilateral CEA at [facility], and previous bilateral carotid stenting, with high grade ISR of Rt. carotid 05/18/12  HTN (hypertension)  Dyslipidemia  Diabetes mellitus  NSTEMI (non-ST elevated myocardial infarction)  Chronic anemia  CKD (chronic kidney disease) stage 4, GFR 15-29 ml/min  PAF (paroxysmal atrial fibrillation), s/p cabg  PLAN: PAF after BM   LOS: 12 days   INGOLD,LAURA R 06/18/2012, 8:23 AM   Agree with note written by Cecilie Kicks RNP  S/P CABG X 1 and tissue AVR for severe symptomatic AS. Pt has done better then expected. Scr has remained stable. Had PAF with RVR last night/early this morning currently in NSR on IV amio. On exam he is mildly volume overloaded with 1-2+ BLE edema. Needs CRH and continued gently  diuresis. May need to consider OP rehap as a bridge to going home. He would prefer to have this in [place] near his wife. Will need to get Case Manager involved closer to D/C date. Will delay CAS until recovered from Surgery.  Lorretta Harp 06/18/2012 9:11 AM

## 2012-06-18 NOTE — Consult Note (Signed)
Asked by Dr Deterding to reevaluate the pt for permanent hemodialysis access.  Pt now s/p aortic valve replacement.  Currently not on dialysis.  Complains of some pain left ankle and deconditioning.  PE: Filed Vitals:   06/17/12 2018 06/18/12 0424 06/18/12 0500 06/18/12 0653  BP: 137/51 132/58 98/57 120/56  Pulse: 73 79  69  Temp: 98.7 F (37.1 C) 98.1 F (36.7 C)    TempSrc: Oral Oral    Resp: 17 18    Height:      Weight:  160 lb (72.576 kg)    SpO2: 97% 98%     Upper extremities: bilateral peripheral IV's Skin: multiple areas of ecchymosis from IV placement Left lower extremity- mild erythema just above lateral and medial malleolus, 2+ edema bilat lower extremities  Assessment: Marginal renal function but stable.  Pt apparently had conversation with Dr Justin Mend earlier today and wishes to defer access placement until he has recovered more physical strength.  Pain erythema left leg ? Early cellulitis?  Will defer to cardiac surgery  Plan: Will follow at a distance.  Available to place access at any time if pt feels ready.  Ruta Hinds, MD [facility] Office: [phone] Pager: [phone]

## 2012-06-18 NOTE — Progress Notes (Signed)
CARDIAC REHAB PHASE I   PRE:  Rate/Rhythm: 69 SR  BP:  Supine:   Sitting: 118/44  Standing:    SaO2: 95 RA  MODE:  Ambulation: 350 ft   POST:  Rate/Rhythem: 84  BP:  Supine:   Sitting: 136/84  Standing:    SaO2: 96 RA 0910-1010 Assisted X 1 used gait belt and walker to ambulate. Gait a little unsteady when first standing. He c/o of left ankle pain with walking. Redness noted up both side of left leg, starting at ankle.Pt able to walk 350 feet. Back to recliner after walk with call light in reach.Reported to RN.   Deon Pilling

## 2012-06-18 NOTE — Progress Notes (Signed)
  Amiodarone Drug - Drug Interaction Consult Note  Recommendations: Amiodarone is metabolized by the cytochrome P450 system and therefore has the potential to cause many drug interactions. Amiodarone has an average plasma half-life of 50 days (range 20 to 100 days).   There is potential for drug interactions to occur several weeks or months after stopping treatment and the onset of drug interactions may be slow after initiating amiodarone.   []  Statins: Increased risk of myopathy. Simvastatin- restrict dose to 20mg  daily. Other statins: counsel patients to report any muscle pain or weakness immediately.  []  Anticoagulants: Amiodarone can increase anticoagulant effect. Consider warfarin dose reduction. Patients should be monitored closely and the dose of anticoagulant altered accordingly, remembering that amiodarone levels take several weeks to stabilize.  []  Antiepileptics: Amiodarone can increase plasma concentration of phenytoin, phenytoin dose should be reduced. Note that small changes in phenytoin dose can result in large changes in phenytoin levels. Monitor patient closely and counsel on signs of toxicity.  [x]  Beta blockers: increased risk of bradycardia, AV block and myocardial depression. Sotalol - avoid concomitant use.  []   Calcium channel blockers (diltiazem and verapamil): increased risk of bradycardia, AV block and myocardial depression.  []   Cyclosporine: Amiodarone increases levels of cyclosporine. Reduced dose of cyclosporine is recommended.  []  Digoxin dose should be halved when amiodarone is started.  [x]  Diuretics: increased risk of cardiotoxicity if hypokalemia occurs.  []  Oral hypoglycemic agents (glyburide, glipizide, glimepiride): increased risk of hypoglycemia. Patient's glucose levels should be monitored closely when initiating amiodarone therapy.   []  Drugs that prolong the QT interval: Concurrent therapy is contraindicated due to the increased risk of torsades de  pointes; . Antibiotics: e.g. fluoroquinolones, erythromycin. . Antiarrhythmics: e.g. quinidine, procainamide, disopyramide, sotalol. . Antipsychotics: e.g. phenothiazines, haloperidol.  . Lithium, tricyclic antidepressants, and methadone. Thank You,  Otila Back  06/18/2012 5:29 AM

## 2012-06-18 NOTE — Progress Notes (Signed)
Pt ambulated 316ft with rolling walker. Unsteady gait in the beginning but able to tolerate well. Denied SOB or pain. Returned to bed safely. Call bell in reach. Will continue to monitor.

## 2012-06-18 NOTE — Progress Notes (Signed)
[facility] ROUNDING NOTE   Subjective:   Interval History: no complaints still with significant lower extremity edema. No shortness of breath.  Objective:  Vital signs in last 24 hours:  Temp:  [97.7 F (36.5 C)-99.3 F (37.4 C)] 98.1 F (36.7 C) (11/04 0424) Pulse Rate:  [69-79] 69  (11/04 0653) Resp:  [13-18] 18  (11/04 0424) BP: (98-152)/(45-58) 120/56 mmHg (11/04 0653) SpO2:  [93 %-98 %] 98 % (11/04 0424) Weight:  [72.576 kg (160 lb)] 72.576 kg (160 lb) (11/04 0424)  Weight change: -1.315 kg (-2 lb 14.4 oz) Filed Weights   06/16/12 0800 06/17/12 0600 06/18/12 0424  Weight: 73.891 kg (162 lb 14.4 oz) 72.3 kg (159 lb 6.3 oz) 72.576 kg (160 lb)    Intake/Output: I/O last 3 completed shifts: In: 760 [P.O.:760] Out: 2410 [Urine:2410]   Intake/Output this shift:     General appearance: alert, cooperative and pale  Resp: diminished breath sounds bilaterally and rales bibasilar  Cardio: S1, S2 normal and systolic murmur: systolic ejection 2/6, decrescendo at 2nd left intercostal space  GI: pos bs,liver down 4 cm, distended  Extremities: edema 2+    Basic Metabolic Panel:  Lab [id number] 06/17/12 0400 06/16/12 0423 06/15/12 0430 06/14/12 1749 06/14/12 1745 06/14/12 0405 06/13/12 2300 06/13/12 0440 06/12/12 0500  NA 137 136 138 137 139 -- -- -- -- --  K 3.8 3.7 3.9 4.6 4.7 -- -- -- -- --  CL 102 102 104 104 106 -- -- -- -- --  CO2 22 25 22 20  -- -- 21 -- -- --  GLUCOSE 127* 79 111* 201* 187* -- -- -- -- --  BUN 54* 52* 50* 44* 37* -- -- -- -- --  CREATININE 2.65* 2.71* 2.66* 2.47* 2.30* -- -- -- -- --  CALCIUM 8.5 8.2* 7.7* -- -- -- -- -- -- --  MG -- -- -- -- -- 2.4 2.3 2.5 -- --  PHOS 3.5 3.7 -- 5.1* -- -- -- -- 3.8 3.8    Liver Function Tests:  Lab 06/18/12 0519 06/17/12 0400 06/15/12 0430 06/13/12 0440 06/12/12 2205 06/12/12 0500  AST 132* -- 342* -- 42* 50*  ALT 129* -- 165* -- 39 47  ALKPHOS 62 -- 50 -- 50 47  BILITOT 0.5 -- 0.3 -- 0.3  0.3  PROT 5.6* -- 5.0* -- 6.5 6.5  ALBUMIN 2.0* 1.9* 2.1* 2.7* 2.8* --   No results found for this basename: LIPASE:5,AMYLASE:5 in the last 168 hours No results found for this basename: AMMONIA:3 in the last 168 hours  CBC:  Lab 06/18/12 0519 06/17/12 0400 06/16/12 0423 06/15/12 0430 06/14/12 1749 06/14/12 1745  WBC 13.2* 10.5 11.2* 18.1* -- 19.7*  NEUTROABS -- -- -- -- -- --  HGB 8.8* 8.8* 8.1* 9.2* 9.9* --  HCT 27.5* 27.1* 25.2* 27.9* 29.0* --  MCV 89.6 89.4 89.7 88.3 -- 86.8  PLT 258 195 161 221 -- 210    Cardiac Enzymes: No results found for this basename: CKTOTAL:5,CKMB:5,CKMBINDEX:5,TROPONINI:5 in the last 168 hours  BNP: No components found with this basename: POCBNP:5  CBG:  Lab 06/17/12 2353 06/17/12 2014 06/17/12 1730 06/17/12 1630 06/17/12 1213  GLUCAP 144* 179* 115* 131* 143*    Microbiology: Results for orders placed during the hospital encounter of 06/06/12  MRSA PCR SCREENING     Status: Normal   Collection Time   06/06/12  6:43 PM      Component Value Range Status Comment   MRSA by PCR NEGATIVE  NEGATIVE Final   SURGICAL PCR SCREEN     Status: Abnormal   Collection Time   06/12/12  9:19 PM      Component Value Range Status Comment   MRSA, PCR NEGATIVE  NEGATIVE Final    Staphylococcus aureus POSITIVE (*) NEGATIVE Final     Coagulation Studies: No results found for this basename: LABPROT:5,INR:5 in the last 72 hours  Urinalysis: No results found for this basename: COLORURINE:2,APPERANCEUR:2,LABSPEC:2,PHURINE:2,GLUCOSEU:2,HGBUR:2,BILIRUBINUR:2,KETONESUR:2,PROTEINUR:2,UROBILINOGEN:2,NITRITE:2,LEUKOCYTESUR:2 in the last 72 hours    Imaging: No results found.   Medications:      . amiodarone (NEXTERONE PREMIX) 360 mg/200 mL dextrose 1 mg/min (06/18/12 0537)   Followed by  . amiodarone (NEXTERONE PREMIX) 360 mg/200 mL dextrose    . [DISCONTINUED] sodium chloride    . [DISCONTINUED] sodium chloride 20 mL/hr at 06/13/12 1900  . [DISCONTINUED]  sodium chloride    . [DISCONTINUED] insulin (NOVOLIN-R) infusion Stopped (06/13/12 2300)  . [DISCONTINUED] nitroGLYCERIN Stopped (06/15/12 1200)  . [DISCONTINUED] phenylephrine (NEO-SYNEPHRINE) Adult infusion        . [COMPLETED] amiodarone  150 mg Intravenous Once  . amiodarone  400 mg Oral Q12H   Followed by  . amiodarone  400 mg Oral Daily  . antiseptic oral rinse  15 mL Mouth Rinse BID  . aspirin EC  325 mg Oral Daily  . darbepoetin (ARANESP) injection - DIALYSIS  150 mcg Subcutaneous Q Mon-HD  . enoxaparin  30 mg Subcutaneous Q24H  . furosemide  80 mg Oral BID  . hydrALAZINE  25 mg Oral BID  . insulin aspart  0-24 Units Subcutaneous TID AC & HS  . metoprolol tartrate  12.5 mg Oral BID  . moving right along book   Does not apply Once  . [COMPLETED] oxyCODONE      . pantoprazole  40 mg Oral Q1200  . sodium chloride  3 mL Intravenous Q12H  . Tamsulosin HCl  0.4 mg Oral Daily  . [DISCONTINUED] acetaminophen (TYLENOL) oral liquid 160 mg/5 mL  975 mg Per Tube Q6H  . [DISCONTINUED] acetaminophen  1,000 mg Oral Q6H  . [DISCONTINUED] aspirin  324 mg Per Tube Daily  . [DISCONTINUED] aspirin EC  325 mg Oral Daily  . [DISCONTINUED] bisacodyl  10 mg Oral Daily  . [DISCONTINUED] bisacodyl  10 mg Rectal Daily  . [DISCONTINUED] docusate sodium  200 mg Oral Daily  . [DISCONTINUED] insulin aspart  0-24 Units Subcutaneous Q4H  . [DISCONTINUED] insulin glargine  15 Units Subcutaneous Daily  . [DISCONTINUED] metoprolol tartrate  25 mg Oral BID  . [DISCONTINUED] pantoprazole  40 mg Oral Daily  . [DISCONTINUED] sodium chloride  3 mL Intravenous Q12H   sodium chloride, bisacodyl, bisacodyl, ondansetron (ZOFRAN) IV, ondansetron, oxyCODONE, sodium chloride, traMADol, [DISCONTINUED] hydrALAZINE, [DISCONTINUED] metoprolol, [DISCONTINUED] ondansetron (ZOFRAN) IV, [DISCONTINUED] oxyCODONE, [DISCONTINUED] oxyCODONE, [DISCONTINUED] oxyCODONE-acetaminophen, [DISCONTINUED] sodium chloride  Assessment/  Plan:  78 y/o male followed by Dr Gwenlyn Found with a history of CAD, critical AS, and PVD. Status post CABG and AVR. 10-31  Chronic renal insufficiency with stable creatinine  Anemia darbapoietin 153mcg q week  Volume still diuresing well lasix 80mg  bid  Atrial Fibrillation rate controlled   LOS: 12 Brenner Visconti W @TODAY @8 :12 AM

## 2012-06-18 NOTE — Progress Notes (Signed)
Call to physician regarding patient going into atrial fib with rapid ventricular rate, patient asymptomatic, went into atrial fib immediately following a difficult BM. Orders given, will continue to monitor.

## 2012-06-19 ENCOUNTER — Inpatient Hospital Stay (HOSPITAL_COMMUNITY): Payer: PRIVATE HEALTH INSURANCE

## 2012-06-19 ENCOUNTER — Ambulatory Visit (HOSPITAL_COMMUNITY)
Admission: RE | Admit: 2012-06-19 | Payer: PRIVATE HEALTH INSURANCE | Source: Ambulatory Visit | Admitting: Cardiovascular Disease

## 2012-06-19 LAB — GLUCOSE, CAPILLARY
Glucose-Capillary: 179 mg/dL — ABNORMAL HIGH (ref 70–99)
Glucose-Capillary: 152 mg/dL — ABNORMAL HIGH (ref 70–99)
Glucose-Capillary: 182 mg/dL — ABNORMAL HIGH (ref 70–99)

## 2012-06-19 SURGERY — CAROTID STENT INSERTION
Anesthesia: LOCAL

## 2012-06-19 MED ORDER — TRAMADOL HCL 50 MG PO TABS: 50.0000 mg | ORAL_TABLET | Freq: Three times a day (TID) | ORAL | Status: DC | PRN

## 2012-06-19 MED ORDER — ASPIRIN 325 MG PO TBEC: 325.0000 mg | DELAYED_RELEASE_TABLET | Freq: Every day | ORAL | Status: DC

## 2012-06-19 MED ORDER — ENOXAPARIN SODIUM 30 MG/0.3ML ~~LOC~~ SOLN
30.0000 mg | SUBCUTANEOUS | Status: DC
  Administered 2012-06-20 – 2012-06-22 (×3): 30 mg via SUBCUTANEOUS
  Filled 2012-06-19 (×4): qty 0.3

## 2012-06-19 MED ORDER — METOPROLOL TARTRATE 25 MG PO TABS: 12.5000 mg | ORAL_TABLET | Freq: Two times a day (BID) | ORAL | Status: DC

## 2012-06-19 MED ORDER — AMIODARONE HCL 400 MG PO TABS: 400.0000 mg | ORAL_TABLET | Freq: Two times a day (BID) | ORAL | Status: DC

## 2012-06-19 NOTE — Progress Notes (Signed)
CARDIAC REHAB PHASE I   PRE:  Rate/Rhythm: 67 SR  BP:  Supine:   Sitting: 130/79  Standing:    SaO2: 98 RA  MODE:  Ambulation: 550 ft   POST:  Rate/Rhythem: 82 SR  BP:  Supine:   Sitting: 130/60  Standing:    SaO2: 98 RA 1425-1500 Assisted X 1 used walker and gait belt to ambulate pt. He is hard to get out of recliner and leans backwards when first standing. Pt leans forward with walking. He c/o of left ankle pain again today with weight bearing. Left leg continues to be red at ankle and up both sides of leg. VS stable  Pt able to walk even with ankle pain, slow pace. Pt to bed after walk to have pacing wires pulled.Call light in reach.  Collin Pearson

## 2012-06-19 NOTE — Progress Notes (Signed)
EPWs removed per order and unit protocol.  Pt tolerated very well.  Atrial site with slight oozing of serousanguinous fluid, pressure applied and it stopped.  Sites painted and left open to air.  Pt understands bedrest for one hour.  VSS, will monitor closely.

## 2012-06-19 NOTE — Progress Notes (Addendum)
                   [address]            [place] [address]          [phone]      6 Days Post-Op Procedure(s) (LRB): CORONARY ARTERY BYPASS GRAFTING (CABG) (N/A) AORTIC VALVE REPLACEMENT (AVR) (N/A)  Subjective: Patient continues to feel better everyday.  Objective: Vital signs in last 24 hours: Temp:  [98.3 F (36.8 C)-98.7 F (37.1 C)] 98.5 F (36.9 C) (11/05 0533) Pulse Rate:  [65-73] 73  (11/05 0533) Cardiac Rhythm:  [-] Normal sinus rhythm (11/05 0730) Resp:  [18] 18  (11/04 1937) BP: (129-159)/(56-62) 138/58 mmHg (11/05 0533) SpO2:  [94 %-97 %] 94 % (11/05 0533) Weight:  [154 lb 8 oz (70.081 kg)] 154 lb 8 oz (70.081 kg) (11/05 0533)  Pre op weight  63.3 kg Current Weight  06/19/12 154 lb 8 oz (70.081 kg)     Intake/Output from previous day: 11/04 0701 - 11/05 0700 In: 600 [P.O.:600] Out: -    Physical Exam:  Cardiovascular: RRR, no murmur Pulmonary: Clear on the right and slightly diminished at left base; no rales, wheezes, or rhonchi. Abdomen: Soft, non tender, bowel sounds present. Extremities: Mild bilateral lower extremity edema. Wounds: Clean and dry.  No erythema or signs of infection.  Lab Results: CBC: Basename 06/18/12 0519 06/17/12 0400  WBC 13.2* 10.5  HGB 8.8* 8.8*  HCT 27.5* 27.1*  PLT 258 195   BMET:  Basename 06/18/12 0519 06/17/12 0400  NA 137 136  K 3.8 3.7  CL 102 102  CO2 22 25  GLUCOSE 127* 79  BUN 54* 52*  CREATININE 2.65* 2.71*  CALCIUM 8.5 8.2*    PT/INR:  Lab Results  Component Value Date   INR 1.55* 06/13/2012   INR 1.06 06/12/2012   INR 1.06 06/04/2010   ABG:  INR: Will add last result for INR, ABG once components are confirmed Will add last 4 CBG results once components are confirmed  Assessment/Plan:  1. CV - Afib yesterday. He has been maintaining SR.Continue Amiodarone 400 bid, Lopressor 12.5 bid, and Hydralazine 25 bid. 2.  Pulmonary - Encourage incentive spirometer. CXR this am  shows small, left pleural effusion and atelectasis, no ptx, cardiomegaly, and right lung is clear. 3. Volume Overload - Continue with Lasix 80 bid  4.  Acute blood loss anemia - Last H and H 8.8 27.5. 5. CKD-last creatinine down to 2.65. He has a history of ESRD and baseline creatinine is 2.8-3.5. 6.DM-CBGs 165/125/114.Pre op HGA1C 6.7. Not restarted on Glipizide as elevated creatinine. Continue Insulin PRN. 7.Remove EPW  ZIMMERMAN,DONIELLE MPA-C 06/19/2012,8:04 AM   Poss home Thursday with home rehab/pt I have seen and examined Collin Pearson and agree with the above assessment  and plan.  Grace Isaac MD [facility] [phone] Office [phone] 06/19/2012 1:29 PM

## 2012-06-19 NOTE — Progress Notes (Signed)
Physical Therapy Treatment Patient Details Name: CORNEILIUS BIENSTOCK MRN: [id number] DOB: 1926-12-17 Today's Date: 06/19/2012 Time: 1035-1100 PT Time Calculation (min): 25 min  PT Assessment / Plan / Recommendation Comments on Treatment Session  pt s/p CABG and AVR.  Mobility improving, but not enough to immediately go directly home with limited assist by wife and family.  Recommend ST-SNF for rehab before D/C    Follow Up Recommendations  Post acute inpatient     Does the patient have the potential to tolerate intense rehabilitation  No, Recommend SNF  Barriers to Discharge        Equipment Recommendations  Rolling walker with 5" wheels;3 in 1 bedside comode    Recommendations for Other Services    Frequency Min 3X/week   Plan Discharge plan needs to be updated;Frequency remains appropriate    Precautions / Restrictions Precautions Precautions: Sternal Restrictions Weight Bearing Restrictions: No   Pertinent Vitals/Pain     Mobility  Bed Mobility Bed Mobility: Not assessed Transfers Transfers: Sit to Stand;Stand to Sit Sit to Stand: 3: Mod assist;Without upper extremity assist;From chair/3-in-1 Stand to Sit: 4: Min assist;Without upper extremity assist;To chair/3-in-1 Details for Transfer Assistance: Reinforced sternal precautions (hands in lap), assist to come forward over his BOS Ambulation/Gait Ambulation/Gait Assistance: 4: Min guard;4: Min assist Ambulation Distance (Feet): 240 Feet Assistive device: Rolling walker Ambulation/Gait Assistance Details: vc's for postural checks, improved use of the RW.  Upon initial stand, pt had a tendency to list posteriorly, then gait was mildly antalgic on L LE  (pt suspects gout).  As gait progressed his gait becaume more stable and became min guard level Gait Pattern: Step-through pattern;Decreased step length - right;Decreased step length - left;Decreased stride length;Trunk flexed Gait velocity: decr Stairs: Yes Stairs  Assistance: 4: Min assist Stairs Assistance Details (indicate cue type and reason): light minimal assist to steady pt on the steps Stair Management Technique: Two rails;Step to pattern;Forwards Number of Stairs: 3  Wheelchair Mobility Wheelchair Mobility: No    Exercises     PT Diagnosis:    PT Problem List:   PT Treatment Interventions:     PT Goals Acute Rehab PT Goals Time For Goal Achievement: 06/22/12 Potential to Achieve Goals: Good PT Goal: Sit to Stand - Progress: Progressing toward goal PT Goal: Stand to Sit - Progress: Progressing toward goal PT Goal: Ambulate - Progress: Progressing toward goal PT Goal: Up/Down Stairs - Progress: Met  Visit Information  Last PT Received On: 06/19/12 Assistance Needed: +1    Subjective Data  Subjective: I was his pastor way back   Cognition  Overall Cognitive Status: Appears within functional limits for tasks assessed/performed Arousal/Alertness: Awake/alert Orientation Level: Appears intact for tasks assessed Behavior During Session: [facility] for tasks performed    Balance  Static Standing Balance Static Standing - Balance Support: Bilateral upper extremity supported;During functional activity Static Standing - Level of Assistance: 4: Min assist  End of Session PT - End of Session Equipment Utilized During Treatment: Gait belt Activity Tolerance: Patient tolerated treatment well;Other (comment);Patient limited by pain Patient left: in chair;with call bell/phone within reach Nurse Communication: Mobility status   GP     Tia Hieronymus, Tessie Fass 06/19/2012, 12:50 PM  06/19/2012  Donnella Sham, PT [phone] (pager)

## 2012-06-19 NOTE — Clinical Social Work Psychosocial (Signed)
Clinical Social Work Department BRIEF PSYCHOSOCIAL ASSESSMENT 06/19/2012  Patient:  Collin Pearson, Collin Pearson     Account Number:  [id number]     Admit date:  06/06/2012  Clinical Social Worker:  Layla Maw  Date/Time:  06/19/2012 03:00 PM  Referred by:  RN  Date Referred:  06/19/2012 Referred for  SNF Placement   Other Referral:   Interview type:  Patient Other interview type:   Spouse    PSYCHOSOCIAL DATA Living Status:  WIFE Admitted from facility:   Level of care:   Primary support name:  [name] Dykstra/[phone] Primary support relationship to patient:  SPOUSE Degree of support available:   4 adult children live out of area    CURRENT CONCERNS Current Concerns  Post-Acute Placement  Adjustment to Illness   Other Concerns:    SOCIAL WORK ASSESSMENT / PLAN CSW was referred to Pt to assist with dc planning. Pt lives with his wife of 5 years. He is retired, but she is still working during the days. [name] 4 adult children live out of the area with 3 of them out of state. Pt has been in the hospital 2weeks and now needs SNF.  CSW began SNF bed search and will update the family with offers in the morning.   Assessment/plan status:  Psychosocial Support/Ongoing Assessment of Needs Other assessment/ plan:   Information/referral to community resources:    PATIENT'S/FAMILY'S RESPONSE TO PLAN OF CARE: Pt is ready to go home but does not have anyone to stay with  him during the day. [name] wife prefers SNF to help Pt transition home from hospital.   Chrys Racer, [place]

## 2012-06-19 NOTE — Progress Notes (Signed)
[facility] ROUNDING NOTE   Subjective:   Interval History:slowly improving still quite weak but recovery is satisfactory  Objective:  Vital signs in last 24 hours:  Temp:  [98.3 F (36.8 C)-98.7 F (37.1 C)] 98.5 F (36.9 C) (11/05 0533) Pulse Rate:  [65-73] 73  (11/05 0533) Resp:  [18] 18  (11/04 1937) BP: (129-159)/(56-62) 138/58 mmHg (11/05 0533) SpO2:  [94 %-97 %] 94 % (11/05 0533) Weight:  [70.081 kg (154 lb 8 oz)] 70.081 kg (154 lb 8 oz) (11/05 0533)  Weight change: -2.495 kg (-5 lb 8 oz) Filed Weights   06/17/12 0600 06/18/12 0424 06/19/12 0533  Weight: 72.3 kg (159 lb 6.3 oz) 72.576 kg (160 lb) 70.081 kg (154 lb 8 oz)    Intake/Output: I/O last 3 completed shifts: In: 700 [P.O.:700] Out: 250 [Urine:250]   Intake/Output this shift:     General appearance: alert, cooperative and pale  Resp: diminished breath sounds bilaterally and rales bibasilar  Cardio: S1, S2 atrial fibrillation with controlled rate and systolic murmur: systolic ejection 2/6, decrescendo at 2nd left intercostal space  GI: pos bs,liver down 4 cm, distended  Extremities: edema 1+    Basic Metabolic Panel:  Lab [id number] 06/17/12 0400 06/16/12 0423 06/15/12 0430 06/14/12 1749 06/14/12 1745 06/14/12 0405 06/13/12 2300 06/13/12 0440  NA 137 136 138 137 139 -- -- -- --  K 3.8 3.7 3.9 4.6 4.7 -- -- -- --  CL 102 102 104 104 106 -- -- -- --  CO2 22 25 22 20  -- -- 21 -- --  GLUCOSE 127* 79 111* 201* 187* -- -- -- --  BUN 54* 52* 50* 44* 37* -- -- -- --  CREATININE 2.65* 2.71* 2.66* 2.47* 2.30* -- -- -- --  CALCIUM 8.5 8.2* 7.7* -- -- -- -- -- --  MG -- -- -- -- -- 2.4 2.3 2.5 --  PHOS 3.5 3.7 -- 5.1* -- -- -- -- 3.8    Liver Function Tests:  Lab 06/18/12 0519 06/17/12 0400 06/15/12 0430 06/13/12 0440 06/12/12 2205  AST 132* -- 342* -- 42*  ALT 129* -- 165* -- 39  ALKPHOS 62 -- 50 -- 50  BILITOT 0.5 -- 0.3 -- 0.3  PROT 5.6* -- 5.0* -- 6.5  ALBUMIN 2.0* 1.9* 2.1* 2.7*  2.8*   No results found for this basename: LIPASE:5,AMYLASE:5 in the last 168 hours No results found for this basename: AMMONIA:3 in the last 168 hours  CBC:  Lab 06/18/12 0519 06/17/12 0400 06/16/12 0423 06/15/12 0430 06/14/12 1749 06/14/12 1745  WBC 13.2* 10.5 11.2* 18.1* -- 19.7*  NEUTROABS -- -- -- -- -- --  HGB 8.8* 8.8* 8.1* 9.2* 9.9* --  HCT 27.5* 27.1* 25.2* 27.9* 29.0* --  MCV 89.6 89.4 89.7 88.3 -- 86.8  PLT 258 195 161 221 -- 210    Cardiac Enzymes: No results found for this basename: CKTOTAL:5,CKMB:5,CKMBINDEX:5,TROPONINI:5 in the last 168 hours  BNP: No components found with this basename: POCBNP:5  CBG:  Lab 06/19/12 0538 06/18/12 2044 06/18/12 1643 06/18/12 1126 06/17/12 2353  GLUCAP 114* 125* 165* 162* 144*    Microbiology: Results for orders placed during the hospital encounter of 06/06/12  MRSA PCR SCREENING     Status: Normal   Collection Time   06/06/12  6:43 PM      Component Value Range Status Comment   MRSA by PCR NEGATIVE  NEGATIVE Final   SURGICAL PCR SCREEN     Status: Abnormal  Collection Time   06/12/12  9:19 PM      Component Value Range Status Comment   MRSA, PCR NEGATIVE  NEGATIVE Final    Staphylococcus aureus POSITIVE (*) NEGATIVE Final     Coagulation Studies: No results found for this basename: LABPROT:5,INR:5 in the last 72 hours  Urinalysis: No results found for this basename: COLORURINE:2,APPERANCEUR:2,LABSPEC:2,PHURINE:2,GLUCOSEU:2,HGBUR:2,BILIRUBINUR:2,KETONESUR:2,PROTEINUR:2,UROBILINOGEN:2,NITRITE:2,LEUKOCYTESUR:2 in the last 72 hours    Imaging: No results found.   Medications:      . [EXPIRED] amiodarone (NEXTERONE PREMIX) 360 mg/200 mL dextrose Stopped (06/18/12 0842)   Followed by  . amiodarone (NEXTERONE PREMIX) 360 mg/200 mL dextrose        . amiodarone  400 mg Oral Q12H   Followed by  . amiodarone  400 mg Oral Daily  . antiseptic oral rinse  15 mL Mouth Rinse BID  . aspirin EC  325 mg Oral Daily  .  darbepoetin (ARANESP) injection - NON-DIALYSIS  150 mcg Subcutaneous Q Mon-1800  . enoxaparin  30 mg Subcutaneous Q24H  . furosemide  80 mg Oral BID  . hydrALAZINE  25 mg Oral BID  . insulin aspart  0-24 Units Subcutaneous TID AC & HS  . metoprolol tartrate  12.5 mg Oral BID  . [COMPLETED] moving right along book   Does not apply Once  . pantoprazole  40 mg Oral Q1200  . sodium chloride  3 mL Intravenous Q12H  . Tamsulosin HCl  0.4 mg Oral Daily  . [DISCONTINUED] darbepoetin (ARANESP) injection - DIALYSIS  150 mcg Subcutaneous Q Mon-HD   sodium chloride, bisacodyl, bisacodyl, ondansetron (ZOFRAN) IV, ondansetron, oxyCODONE, sodium chloride, traMADol  Assessment/ Plan:  78 y/o male followed by Dr Gwenlyn Found with a history of CAD, critical AS, and PVD. Status post CABG and AVR. 10-31  Chronic renal insufficiency with stable creatinine  Anemia darbapoietin 129mcg q week  Volume still diuresing well lasix 80mg  bid  Atrial Fibrillation rate controlled  Will sign off continue diuretics as outpatient and will schedule appointment with Dr Posey Pronto   LOS: 13 Haniya Fern W @TODAY @7 :58 AM

## 2012-06-19 NOTE — Progress Notes (Signed)
Nutrition Brief Note  Chart reviewed. Patient s/p CABG, aortic valve replacement 10/30.   Body mass index is 25.32 kg/(m^2). Pt meets criteria for Overweight based on current BMI.   Current diet order is Heart Healthy, patient is consuming approximately 100% of meals at this time. Labs and medications reviewed.   No nutrition interventions warranted at this time. If nutrition issues arise, please consult RD.   Phillips Odor, RD, LDN Pager #: [phone] After-Hours Pager #: [phone]

## 2012-06-19 NOTE — Progress Notes (Signed)
6 Days Post-Op Procedure(s) (LRB):  CORONARY ARTERY BYPASS GRAFTING (CABG) (N/A)  AORTIC VALVE REPLACEMENT   Subjective: Stated he felt well, wants to go home.  Objective: Vital signs in last 24 hours: Temp:  [98.3 F (36.8 C)-98.7 F (37.1 C)] 98.5 F (36.9 C) (11/05 0533) Pulse Rate:  [65-73] 73  (11/05 0533) Resp:  [18] 18  (11/04 1937) BP: (129-159)/(56-62) 138/58 mmHg (11/05 0533) SpO2:  [94 %-97 %] 94 % (11/05 0533) Weight:  [70.081 kg (154 lb 8 oz)] 70.081 kg (154 lb 8 oz) (11/05 0533) Weight change: -2.495 kg (-5 lb 8 oz) Last BM Date: 06/18/12 Intake/Output from previous day: +600 11/04 0701 - 11/05 0700 In: 600 [P.O.:600] Out: -  Intake/Output this shift:    PE: General:alert and oriented, pleasant Heart:S1S2 RRR--PAF yesterday AM, now with PACs Lungs:diminished in the bases Abd:+ BS, soft, non tender Ext:1-2+ edema    Lab Results:  Basename 06/18/12 0519 06/17/12 0400  WBC 13.2* 10.5  HGB 8.8* 8.8*  HCT 27.5* 27.1*  PLT 258 195   BMET  Basename 06/18/12 0519 06/17/12 0400  NA 137 136  K 3.8 3.7  CL 102 102  CO2 22 25  GLUCOSE 127* 79  BUN 54* 52*  CREATININE 2.65* 2.71*  CALCIUM 8.5 8.2*    Lab Results  Component Value Date   HGBA1C 6.7* 06/12/2012     Lab Results  Component Value Date   TSH 2.764 06/06/2012    Hepatic Function Panel  Basename 06/18/12 0519  PROT 5.6*  ALBUMIN 2.0*  AST 132*  ALT 129*  ALKPHOS 62  BILITOT 0.5  BILIDIR --  IBILI --    Studies/Results: Dg Chest 2 View  06/19/2012  *RADIOLOGY REPORT*  Clinical Data: Status post bypass grafting surgery with persistent chest pain  CHEST - 2 VIEW  Comparison: 06/16/2012  Findings: The heart and pulmonary vascularity are within normal limits.  A stent is again noted in the right neck.  The previously seen central line has been removed in the interval.  The lungs are well aerated although some persistent left lower lobe atelectasis is seen.  No pneumothorax is noted.  A  small left pleural effusion is again seen.  IMPRESSION: Stable changes in the left base.  No new focal abnormality is noted.   Original Report Authenticated By: Inez Catalina, M.D.     Medications: I have reviewed the patient's current medications.    Marland Kitchen amiodarone  400 mg Oral Q12H   Followed by  . amiodarone  400 mg Oral Daily  . antiseptic oral rinse  15 mL Mouth Rinse BID  . aspirin EC  325 mg Oral Daily  . darbepoetin (ARANESP) injection - NON-DIALYSIS  150 mcg Subcutaneous Q Mon-1800  . enoxaparin  30 mg Subcutaneous Q24H  . furosemide  80 mg Oral BID  . hydrALAZINE  25 mg Oral BID  . insulin aspart  0-24 Units Subcutaneous TID AC & HS  . metoprolol tartrate  12.5 mg Oral BID  . [COMPLETED] moving right along book   Does not apply Once  . pantoprazole  40 mg Oral Q1200  . sodium chloride  3 mL Intravenous Q12H  . Tamsulosin HCl  0.4 mg Oral Daily  . [DISCONTINUED] darbepoetin (ARANESP) injection - DIALYSIS  150 mcg Subcutaneous Q Mon-HD  . [DISCONTINUED] enoxaparin  30 mg Subcutaneous Q24H   Assessment/Plan: Principal Problem:  *S/P CABG x 1 with tissue AVR 06/13/12 Active Problems:  CAD, patent stent to LAD  and mid RCA with occluded OM2 branch    AS (aortic stenosis), critical stenosis  PVD, with hx of bilateral CEA at [facility], and previous bilateral carotid stenting, with high grade ISR of Rt. carotid 05/18/12  HTN (hypertension)  Dyslipidemia  Diabetes mellitus  NSTEMI (non-ST elevated myocardial infarction)  Chronic anemia  CKD (chronic kidney disease) stage 4, GFR 15-29 ml/min  PAF (paroxysmal atrial fibrillation), s/p cabg  PLAN:+PACs, last A fib yesterday am-on lopressor and amiodarone  + edema, voume overload- on lasix Continues with anemia   LOS: 13 days   INGOLD,LAURA R 06/19/2012, 9:26 AM   Agree with note written by Cecilie Kicks RNP  POD #6 CABG/AVR. PAF currently in NSR on Amio and BB. Ambulating w/o difficulty. CXR clear. Exam notable for 2+ pitting  BLE edema. SCr stable. Getting IV lasix. Home per [facility] on diuretics. ? Will he need OP rehab or [place]. ROV with me 2-3 weeks.  Lorretta Harp 06/19/2012 10:15 AM

## 2012-06-20 DIAGNOSIS — M79609 Pain in unspecified limb: Secondary | ICD-10-CM

## 2012-06-20 DIAGNOSIS — M7989 Other specified soft tissue disorders: Secondary | ICD-10-CM

## 2012-06-20 LAB — GLUCOSE, CAPILLARY
Glucose-Capillary: 126 mg/dL — ABNORMAL HIGH (ref 70–99)
Glucose-Capillary: 156 mg/dL — ABNORMAL HIGH (ref 70–99)
Glucose-Capillary: 132 mg/dL — ABNORMAL HIGH (ref 70–99)

## 2012-06-20 LAB — BASIC METABOLIC PANEL
Chloride: 99 mEq/L (ref 96–112)
Creatinine, Ser: 2.41 mg/dL — ABNORMAL HIGH (ref 0.50–1.35)
GFR calc Af Amer: 27 mL/min — ABNORMAL LOW (ref >90)
GFR calc non Af Amer: 23 mL/min — ABNORMAL LOW (ref >90)

## 2012-06-20 LAB — CBC
MCHC: 32.9 g/dL (ref 30.0–36.0)
MCV: 89.4 fL (ref 78.0–100.0)
Platelets: 261 10*3/uL (ref 150–400)
RDW: 16.6 % — ABNORMAL HIGH (ref 11.5–15.5)
WBC: 12 10*3/uL — ABNORMAL HIGH (ref 4.0–10.5)

## 2012-06-20 MED ORDER — HYDRALAZINE HCL 25 MG PO TABS
25.0000 mg | ORAL_TABLET | Freq: Three times a day (TID) | ORAL | Status: DC
  Administered 2012-06-20 – 2012-06-22 (×8): 25 mg via ORAL
  Filled 2012-06-20 (×9): qty 1

## 2012-06-20 MED ORDER — GLIPIZIDE 5 MG PO TABS
5.0000 mg | ORAL_TABLET | Freq: Two times a day (BID) | ORAL | Status: DC
  Administered 2012-06-20 – 2012-06-22 (×5): 5 mg via ORAL
  Filled 2012-06-20 (×6): qty 1

## 2012-06-20 MED FILL — Sodium Chloride IV Soln 0.9%: INTRAVENOUS | Qty: 1000 | Status: AC

## 2012-06-20 MED FILL — Lidocaine HCl IV Inj 20 MG/ML: INTRAVENOUS | Qty: 5 | Status: AC

## 2012-06-20 MED FILL — Electrolyte-R (PH 7.4) Solution: INTRAVENOUS | Qty: 8000 | Status: AC

## 2012-06-20 MED FILL — Mannitol IV Soln 20%: INTRAVENOUS | Qty: 500 | Status: AC

## 2012-06-20 MED FILL — Sodium Chloride Irrigation Soln 0.9%: Qty: 3000 | Status: AC

## 2012-06-20 MED FILL — Sodium Bicarbonate IV Soln 8.4%: INTRAVENOUS | Qty: 50 | Status: AC

## 2012-06-20 MED FILL — Heparin Sodium (Porcine) Inj 1000 Unit/ML: INTRAMUSCULAR | Qty: 10 | Status: AC

## 2012-06-20 NOTE — Progress Notes (Signed)
Pt ambulated  500 ft in hallway with 1 assist and rolling walker tolerated well Collin Pearson

## 2012-06-20 NOTE — Progress Notes (Addendum)
[facility]  Subjective: Not feeling well.  No energy and his left leg hurts.  Objective: Vital signs in last 24 hours: Temp:  [98.3 F (36.8 C)-98.5 F (36.9 C)] 98.5 F (36.9 C) (11/06 0441) Pulse Rate:  [64-77] 77  (11/06 1054) Resp:  [18-20] 20  (11/06 0441) BP: (123-151)/(53-68) 132/65 mmHg (11/06 1054) SpO2:  [94 %-97 %] 95 % (11/06 0441) Weight:  [66.407 kg (146 lb 6.4 oz)] 66.407 kg (146 lb 6.4 oz) (11/06 0441) Last BM Date: 06/19/12  Intake/Output from previous day: 11/05 0701 - 11/06 0700 In: 720 [P.O.:720] Out: -  Intake/Output this shift: Total I/O In: 243 [P.O.:240; I.V.:3] Out: -   Medications Current Facility-Administered Medications  Medication Dose Route Frequency Provider Last Rate Last Dose  . 0.9 %  sodium chloride infusion  250 mL Intravenous PRN Grace Isaac, MD      . amiodarone (NEXTERONE PREMIX) 360 mg/200 mL dextrose IV infusion  0.5 mg/min Intravenous Continuous Grace Isaac, MD      . amiodarone (PACERONE) tablet 400 mg  400 mg Oral Q12H Grace Isaac, MD   400 mg at 06/20/12 1054   Followed by  . amiodarone (PACERONE) tablet 400 mg  400 mg Oral Daily Grace Isaac, MD      . antiseptic oral rinse (BIOTENE) solution 15 mL  15 mL Mouth Rinse BID Grace Isaac, MD   15 mL at 06/20/12 0905  . aspirin EC tablet 325 mg  325 mg Oral Daily Grace Isaac, MD   325 mg at 06/20/12 1054  . bisacodyl (DULCOLAX) EC tablet 10 mg  10 mg Oral Daily PRN Grace Isaac, MD   10 mg at 06/20/12 0516   Or  . bisacodyl (DULCOLAX) suppository 10 mg  10 mg Rectal Daily PRN Grace Isaac, MD      . darbepoetin [facility]) injection 150 mcg  150 mcg Subcutaneous Q Mon-1800 Placido Sou, MD   150 mcg at 06/18/12 1654  . enoxaparin (LOVENOX) injection 30 mg  30 mg Subcutaneous Q24H Nani Skillern, PA   30 mg at 06/20/12 0906  . furosemide (LASIX) tablet 80 mg  80 mg Oral BID Grace Isaac, MD   80  mg at 06/20/12 0906  . glipiZIDE (GLUCOTROL) tablet 5 mg  5 mg Oral BID AC [place] Breeze, PA      . hydrALAZINE (APRESOLINE) tablet 25 mg  25 mg Oral TID [place]idge Breeze, PA   25 mg at 06/20/12 1054  . insulin aspart (novoLOG) injection 0-24 Units  0-24 Units Subcutaneous TID AC & HS Grace Isaac, MD   2 Units at 06/20/12 [phone]  . metoprolol tartrate (LOPRESSOR) tablet 12.5 mg  12.5 mg Oral BID Grace Isaac, MD   12.5 mg at 06/20/12 1054  . ondansetron (ZOFRAN) tablet 4 mg  4 mg Oral Q6H PRN Grace Isaac, MD       Or  . ondansetron [facility]) injection 4 mg  4 mg Intravenous Q6H PRN Grace Isaac, MD      . oxyCODONE (Oxy IR/ROXICODONE) immediate release tablet 5 mg  5 mg Oral Q4H PRN Grace Isaac, MD   5 mg at 06/19/12 1857  . pantoprazole (PROTONIX) EC tablet 40 mg  40 mg Oral Q1200 Grace Isaac, MD   40 mg at 06/20/12 1222  . sodium chloride 0.9 % injection 3 mL  3 mL Intravenous  Q12H Grace Isaac, MD   3 mL at 06/20/12 1054  . sodium chloride 0.9 % injection 3 mL  3 mL Intravenous PRN Grace Isaac, MD      . Tamsulosin HCl [facility]) capsule 0.4 mg  0.4 mg Oral Daily Doreene Burke [place], [place]   0.4 mg at 06/20/12 1054  . traMADol (ULTRAM) tablet 50 mg  50 mg Oral Q8H PRN Grace Isaac, MD   50 mg at 06/20/12 0516  . [DISCONTINUED] hydrALAZINE (APRESOLINE) tablet 25 mg  25 mg Oral BID Placido Sou, MD   25 mg at 06/19/12 2148    PE: General appearance: alert, cooperative and mild distress Lungs: clear to auscultation bilaterally Heart: regular rate and rhythm and 1/6 systolic MM Extremities: 2+ LEE  Ecchymosis left armj Pulses: 2+ and symmetric Skin: Left LE:  errythema anteriorly and posteriorly just above the ankle..  Warm to touch. Neurologic: Grossly normal  Lab Results:   Basename 06/20/12 0500 06/18/12 0519  WBC 12.0* 13.2*  HGB 9.4* 8.8*  HCT 28.6* 27.5*  PLT 261 258   BMET  Basename 06/20/12 0500 06/18/12 0519  NA 136 137  K 3.5 3.8    CL 99 102  CO2 25 22  GLUCOSE 145* 127*  BUN 51* 54*  CREATININE 2.41* 2.65*  CALCIUM 8.8 8.5    Assessment/Plan  Principal Problem:  *S/P CABG x 1 with tissue AVR 06/13/12 Active Problems:  AS (aortic stenosis), critical stenosis  CAD, patent stent to LAD and mid RCA with occluded OM2 branch    PVD, with hx of bilateral CEA at [facility], and previous bilateral carotid stenting, with high grade ISR of Rt. carotid 05/18/12  HTN (hypertension)  Dyslipidemia  Diabetes mellitus  NSTEMI (non-ST elevated myocardial infarction)  Chronic anemia  CKD (chronic kidney disease) stage 4, GFR 15-29 ml/min  PAF (paroxysmal atrial fibrillation), s/p cabg  Plan:  S/P CABG x1 and tissue AVR.  S/P LEA dopplers R/L ABIs 0.68/0.67.  Hx of PVD.  Not sure how this compares to previous dopplers.  Likely has cellulitis.  Tender to touch.  May need Abx.  BP and HR stable   LOS: 14 days    HAGER, BRYAN 06/20/2012 12:27 PM   Patient seen and examined. Agree with assessment and plan.  Ecchymosis right arm, pt states from fall over garbage can last Friday in trying to answer the phone. Continue diuresis.  Cr slightly improved at 2.41.  Maintaining NSR on amiodarone.   Troy Sine, MD, [facility] 06/20/2012 1:09 PM

## 2012-06-20 NOTE — Progress Notes (Addendum)
PT Cancellation Note  Patient Details Name: Collin Pearson MRN: [id number] DOB: November 14, 1926   Cancelled Treatment:    Reason Eval/Treat Not Completed: Pain limiting ability to participate;Other (comment) (pt politely refused this am seen by [facility] this afternoon) 06/20/2012  Donnella Sham, PT [phone] (pager)     Tiffanyann Deroo, Tessie Fass 06/20/2012, 5:14 PM

## 2012-06-20 NOTE — Progress Notes (Signed)
CARDIAC REHAB PHASE I   PRE:  Rate/Rhythm: 68 SR  BP:  Supine: 124/50  Sitting:   Standing:    SaO2: 96 RA  MODE:  Ambulation: 500 ft   POST:  Rate/Rhythem: 66  BP:  Supine: 130/60  Sitting:   Standing:    SaO2: 96 RA 1325-1415 On arrival pt in bed states that he has got his left leg in a positon that is comfortable. Pt willing to try to ambulate. Assisted X 1 used walker and gait belt to ambulate. Pt states that ankle is painful, but improves the more he walked. VS stable  Pt back to bed after walk with call light in reach. Lower left leg continues red and swollen.  Collin Pearson

## 2012-06-20 NOTE — Progress Notes (Addendum)
Bilateral:  No evidence of DVT, superficial thrombosis, or Baker's Cyst.  VASCULAR LAB PRELIMINARY  ARTERIAL  ABI completed:    RIGHT    LEFT    PRESSURE WAVEFORM  PRESSURE WAVEFORM  BRACHIAL 171 T BRACHIAL    DP   DP    AT 116 M AT 115 M  PT 36 DM PT  ? venous  PER   PER 162 M  GREAT TOE  adequate GREAT TOE  adequate    RIGHT LEFT  ABI 0.68 0.95     Rochell Puett, 06/20/2012, 12:15 PM

## 2012-06-20 NOTE — Progress Notes (Addendum)
                    [address]            [place] [address]          [phone]     7 Days Post-Op Procedure(s) (LRB): CORONARY ARTERY BYPASS GRAFTING (CABG) (N/A) AORTIC VALVE REPLACEMENT (AVR) (N/A)  Subjective: C/o left foot and ankle discomfort.  Otherwise doing well.   Objective: Vital signs in last 24 hours: Patient Vitals for the past 24 hrs:  BP Temp Temp src Pulse Resp SpO2 Weight  06/20/12 0441 151/68 mmHg 98.5 F (36.9 C) Oral 68  20  95 % 146 lb 6.4 oz (66.407 kg)  06/19/12 2107 145/55 mmHg 98.4 F (36.9 C) Oral 68  18  97 % -  06/19/12 1507 139/57 mmHg - - 64  - - -  06/19/12 1400 123/53 mmHg 98.3 F (36.8 C) Oral 65  18  94 % -   Current Weight  06/20/12 146 lb 6.4 oz (66.407 kg)     Intake/Output from previous day: 11/05 0701 - 11/06 0700 In: 720 [P.O.:720] Out: -   CBGs 152-182-126-145  PHYSICAL EXAM:  Heart: RRR Lungs: Clear Wound: Clean and dry Extremities: 1-2+ LE edema, L>R   Lab Results: CBC: Basename 06/20/12 0500 06/18/12 0519  WBC 12.0* 13.2*  HGB 9.4* 8.8*  HCT 28.6* 27.5*  PLT 261 258   BMET:  Basename 06/20/12 0500 06/18/12 0519  NA 136 137  K 3.5 3.8  CL 99 102  CO2 25 22  GLUCOSE 145* 127*  BUN 51* 54*  CREATININE 2.41* 2.65*  CALCIUM 8.8 8.5    PT/INR: No results found for this basename: LABPROT,INR in the last 72 hours    Assessment/Plan: S/P Procedure(s) (LRB): CORONARY ARTERY BYPASS GRAFTING (CABG) (N/A) AORTIC VALVE REPLACEMENT (AVR) (N/A)  CV- AF, now SR.  Continue Amio, Lopressor.  BPs trending up.  Will increase Hydralazine to home dose and not increase beta blocker further as HR still 60s.  Vol overload- continue aggressive diuresis.    DM- CBGs generally stable, will resume po glucotrol and titrate as needed.  CKD- Cr trending down.  Monitor.  Expected post op blood loss anemia- stable.  Disp- Hopefully to SNF soon.   LOS: 14 days    Collin Pearson,Collin Pearson 06/20/2012   patient wanted to  going home with pt at home but now wishes to go to SNF since he need  More help getting around Plan d/c soon Will check lower extremity doppler since left leg still bothers him I have seen and examined Collin Pearson and agree with the above assessment  and plan.  Grace Isaac MD Beeper [phone] Office [phone] 06/20/2012 9:12 AM

## 2012-06-21 LAB — GLUCOSE, CAPILLARY
Glucose-Capillary: 140 mg/dL — ABNORMAL HIGH (ref 70–99)
Glucose-Capillary: 163 mg/dL — ABNORMAL HIGH (ref 70–99)

## 2012-06-21 NOTE — Progress Notes (Signed)
Patient ID: Collin Pearson, male   DOB: 06-Nov-1926, 78 y.o.   MRN: [id number]                   [address]            [place] [address]          [phone]     8 Days Post-Op Procedure(s) (LRB): CORONARY ARTERY BYPASS GRAFTING (CABG) (N/A) AORTIC VALVE REPLACEMENT (AVR) (N/A)  LOS: 15 days   Subjective: Feels better wants to go home   Objective: Vital signs in last 24 hours: Patient Vitals for the past 24 hrs:  BP Temp Temp src Pulse Resp SpO2 Weight  06/21/12 0636 167/68 mmHg 98.3 F (36.8 C) Oral 62  18  97 % 147 lb 14.9 oz (67.1 kg)  06/20/12 2126 160/66 mmHg 98.1 F (36.7 C) Oral 61  18  96 % -  06/20/12 1733 167/71 mmHg - - - - - -  06/20/12 1528 126/65 mmHg 97.9 F (36.6 C) Oral 62  20  97 % -  06/20/12 1054 132/65 mmHg - - 77  - - -    Filed Weights   06/19/12 0533 06/20/12 0441 06/21/12 0636  Weight: 154 lb 8 oz (70.081 kg) 146 lb 6.4 oz (66.407 kg) 147 lb 14.9 oz (67.1 kg)    Hemodynamic parameters for last 24 hours:    Intake/Output from previous day: 11/06 0701 - 11/07 0700 In: 723 [P.O.:720; I.V.:3] Out: 1 [Stool:1] Intake/Output this shift:    Scheduled Meds:   . amiodarone  400 mg Oral Q12H   Followed by  . amiodarone  400 mg Oral Daily  . antiseptic oral rinse  15 mL Mouth Rinse BID  . aspirin EC  325 mg Oral Daily  . darbepoetin (ARANESP) injection - NON-DIALYSIS  150 mcg Subcutaneous Q Mon-1800  . enoxaparin  30 mg Subcutaneous Q24H  . furosemide  80 mg Oral BID  . glipiZIDE  5 mg Oral BID AC  . hydrALAZINE  25 mg Oral TID  . insulin aspart  0-24 Units Subcutaneous TID AC & HS  . metoprolol tartrate  12.5 mg Oral BID  . pantoprazole  40 mg Oral Q1200  . sodium chloride  3 mL Intravenous Q12H  . Tamsulosin HCl  0.4 mg Oral Daily   Continuous Infusions:   . amiodarone (NEXTERONE PREMIX) 360 mg/200 mL dextrose     PRN Meds:.sodium chloride, bisacodyl, bisacodyl, ondansetron (ZOFRAN) IV, ondansetron, oxyCODONE, sodium  chloride, traMADol  General appearance: alert and cooperative Neurologic: intact Heart: regular rate and rhythm, S1, S2 normal, no murmur, click, rub or gallop and normal apical impulse Lungs: clear to auscultation bilaterally and normal percussion bilaterally Abdomen: soft, non-tender; bowel sounds normal; no masses,  no organomegaly Extremities: extremities normal, atraumatic, no cyanosis or edema and Homans sign is negative, no sign of DVT Wound: stable sternum  Lab Results: CBC: Basename 06/20/12 0500  WBC 12.0*  HGB 9.4*  HCT 28.6*  PLT 261   BMET:  Basename 06/20/12 0500  NA 136  K 3.5  CL 99  CO2 25  GLUCOSE 145*  BUN 51*  CREATININE 2.41*  CALCIUM 8.8    PT/INR: No results found for this basename: LABPROT,INR in the last 72 hours   Radiology No results found.   Assessment/Plan: S/P Procedure(s) (LRB): CORONARY ARTERY BYPASS GRAFTING (CABG) (N/A) AORTIC VALVE REPLACEMENT (AVR) (N/A) Left leg better, doppler arterial and venous lower  extremity ok  yesterday Waiting for snf Renal function stable  Grace Isaac MD 06/21/2012 8:53 AM

## 2012-06-21 NOTE — Progress Notes (Signed)
[facility]  Subjective: Feeling better today.  Objective: Vital signs in last 24 hours: Temp:  [97.9 F (36.6 C)-98.3 F (36.8 C)] 98.3 F (36.8 C) (11/07 0636) Pulse Rate:  [61-66] 66  (11/07 1055) Resp:  [18-20] 18  (11/07 0636) BP: (126-167)/(65-71) 140/70 mmHg (11/07 1055) SpO2:  [96 %-97 %] 97 % (11/07 0636) Weight:  [67.1 kg (147 lb 14.9 oz)] 67.1 kg (147 lb 14.9 oz) (11/07 0636) Last BM Date: 06/20/12  Intake/Output from previous day: 11/06 0701 - 11/07 0700 In: 723 [P.O.:720; I.V.:3] Out: 1 [Stool:1] Intake/Output this shift: Total I/O In: 240 [P.O.:240] Out: 1 [Stool:1]  Medications Current Facility-Administered Medications  Medication Dose Route Frequency Provider Last Rate Last Dose  . 0.9 %  sodium chloride infusion  250 mL Intravenous PRN Grace Isaac, MD      . amiodarone (NEXTERONE PREMIX) 360 mg/200 mL dextrose IV infusion  0.5 mg/min Intravenous Continuous Grace Isaac, MD      . amiodarone (PACERONE) tablet 400 mg  400 mg Oral Q12H Grace Isaac, MD   400 mg at 06/21/12 1055   Followed by  . amiodarone (PACERONE) tablet 400 mg  400 mg Oral Daily Grace Isaac, MD      . antiseptic oral rinse (BIOTENE) solution 15 mL  15 mL Mouth Rinse BID Grace Isaac, MD   15 mL at 06/20/12 2131  . aspirin EC tablet 325 mg  325 mg Oral Daily Grace Isaac, MD   325 mg at 06/21/12 1055  . bisacodyl (DULCOLAX) EC tablet 10 mg  10 mg Oral Daily PRN Grace Isaac, MD   10 mg at 06/20/12 0516   Or  . bisacodyl (DULCOLAX) suppository 10 mg  10 mg Rectal Daily PRN Grace Isaac, MD      . darbepoetin [facility]) injection 150 mcg  150 mcg Subcutaneous Q Mon-1800 Placido Sou, MD   150 mcg at 06/18/12 1654  . enoxaparin (LOVENOX) injection 30 mg  30 mg Subcutaneous Q24H Nani Skillern, PA   30 mg at 06/21/12 0818  . furosemide (LASIX) tablet 80 mg  80 mg Oral BID Grace Isaac, MD   80 mg at 06/21/12  0818  . glipiZIDE (GLUCOTROL) tablet 5 mg  5 mg Oral BID AC Coolidge Breeze, PA   5 mg at 06/21/12 0645  . hydrALAZINE (APRESOLINE) tablet 25 mg  25 mg Oral TID Coolidge Breeze, PA   25 mg at 06/21/12 1055  . insulin aspart (novoLOG) injection 0-24 Units  0-24 Units Subcutaneous TID AC & HS Grace Isaac, MD   2 Units at 06/21/12 [phone]  . metoprolol tartrate (LOPRESSOR) tablet 12.5 mg  12.5 mg Oral BID Grace Isaac, MD   12.5 mg at 06/21/12 1055  . ondansetron (ZOFRAN) tablet 4 mg  4 mg Oral Q6H PRN Grace Isaac, MD       Or  . ondansetron [facility]) injection 4 mg  4 mg Intravenous Q6H PRN Grace Isaac, MD      . oxyCODONE (Oxy IR/ROXICODONE) immediate release tablet 5 mg  5 mg Oral Q4H PRN Grace Isaac, MD   5 mg at 06/19/12 1857  . pantoprazole (PROTONIX) EC tablet 40 mg  40 mg Oral Q1200 Grace Isaac, MD   40 mg at 06/20/12 1222  . sodium chloride 0.9 % injection 3 mL  3 mL Intravenous Q12H Grace Isaac, MD  3 mL at 06/20/12 2136  . sodium chloride 0.9 % injection 3 mL  3 mL Intravenous PRN Grace Isaac, MD      . Tamsulosin HCl [facility]) capsule 0.4 mg  0.4 mg Oral Daily Doreene Burke Saks, PA   0.4 mg at 06/21/12 1055  . traMADol (ULTRAM) tablet 50 mg  50 mg Oral Q8H PRN Grace Isaac, MD   50 mg at 06/20/12 1457    PE: General appearance: alert, cooperative and mild distress  Lungs: clear to auscultation bilaterally  Heart: regular rate and rhythm and 1/6 systolic MM  Extremities: 2+ LEE Ecchymosis left armj  Pulses: 2+ and symmetric  Skin: Left LE: errythema anteriorly and posteriorly just above the ankle.. Warm to touch.  Neurologic: Grossly normal   Lab Results:   Basename 06/20/12 0500  WBC 12.0*  HGB 9.4*  HCT 28.6*  PLT 261   BMET  Basename 06/20/12 0500  NA 136  K 3.5  CL 99  CO2 25  GLUCOSE 145*  BUN 51*  CREATININE 2.41*  CALCIUM 8.8     Assessment/Plan   Principal Problem:  *S/P CABG x 1 with tissue AVR  06/13/12 Active Problems:  AS (aortic stenosis), critical stenosis  CAD, patent stent to LAD and mid RCA with occluded OM2 branch    PVD, with hx of bilateral CEA at [facility], and previous bilateral carotid stenting, with high grade ISR of Rt. carotid 05/18/12  HTN (hypertension)  Dyslipidemia  Diabetes mellitus  NSTEMI (non-ST elevated myocardial infarction)  Chronic anemia  CKD (chronic kidney disease) stage 4, GFR 15-29 ml/min  PAF (paroxysmal atrial fibrillation), s/p cabg  Plan:  Awaiting SNF placement.  BP, HR and HGB stable.  Will arrange followup at [facility].      LOS: 15 days    HAGER, BRYAN 06/21/2012 12:11 PM     Patient seen and examined. Agree with assessment and plan. Will F/U renal function.  Feels much better today; much more alert. Awaiting SNF placement short term.   Troy Sine, MD, [facility] 06/21/2012 1:07 PM

## 2012-06-21 NOTE — Clinical Social Work Note (Signed)
CSW spoke to CM regarding family concerns with Pt going home. CSW called [employer] again and was able to leave a msg for a case manager and get a fax number to send clinicals. CSW will submit clinicals for preauthorization for SNF.   Chrys Racer, [place]

## 2012-06-21 NOTE — Clinical Social Work Note (Signed)
CSW followed up this morning with SNF and [name] wife to confirm dc plans. CSW became aware this morning that [name] medicare is secondary and his primary insurance is [employer]. There are no SNFs in network near [name] home and the possibility of insurance negotiating with an area facility is unlikely d/t [name] ability to ambulate 531ft on RA with 1 assist at this time.  CSW left msg with insurance company and discussed concerns with area SNFs who agree that claim is likely to get denied.  CSW followed up with [name] wife to discuss alternatives including HH and increased supervision by family and friends.  CSW contacted CM with update. CM will f/u with [name] wife to discuss maximizing HH for dc.   Chrys Racer, [place]

## 2012-06-21 NOTE — Clinical Social Work Note (Signed)
CSW faxed clinicals to care management at [facility]. Care Management # [phone]  Fax # [phone].   Chrys Racer, [place]

## 2012-06-21 NOTE — Progress Notes (Signed)
CARDIAC REHAB PHASE I   PRE:  Rate/Rhythm: 61SR  BP:  Supine:   Sitting: 130/60  Standing:    SaO2: 96%RA  MODE:  Ambulation: 700 ft   POST:  Rate/Rhythem: 63  BP:  Supine: 150/66  Sitting:   Standing:    SaO2: 94%RA 1422-1510 Pt leaned backward as he stood. Would have fallen backward if I had not steadied pt. Used gait belt during walk. Pt walked 700 ft with rolling walker. Encouraged pt to stay close to walker. To bed after walk. Call bell in reach.  Jeani Sow

## 2012-06-22 MED ORDER — AMIODARONE HCL 200 MG PO TABS: 200.0000 mg | ORAL_TABLET | Freq: Two times a day (BID) | ORAL | Status: DC

## 2012-06-22 MED ORDER — AMIODARONE HCL 200 MG PO TABS
200.0000 mg | ORAL_TABLET | Freq: Two times a day (BID) | ORAL | Status: DC
  Administered 2012-06-22: 200 mg via ORAL
  Filled 2012-06-22 (×2): qty 1

## 2012-06-22 MED ORDER — METOPROLOL TARTRATE 25 MG PO TABS: 12.5000 mg | ORAL_TABLET | Freq: Two times a day (BID) | ORAL | Status: DC

## 2012-06-22 MED ORDER — FUROSEMIDE 80 MG PO TABS: 80.0000 mg | ORAL_TABLET | Freq: Two times a day (BID) | ORAL | Status: DC

## 2012-06-22 NOTE — Clinical Social Work Note (Signed)
CSW confirmed SNF bed at [facility]. DC info sent to facility. [place] reported that authorization was received from [facility] for treatment.  CSW spoke with [name] wife at bedside and she is transporting Pt to snf.  No further CSW needs at this time.   Chrys Racer, [place]

## 2012-06-22 NOTE — Progress Notes (Addendum)
                   Collin Pearson            [place] [address]          [phone]      9 Days Post-Op Procedure(s) (LRB): CORONARY ARTERY BYPASS GRAFTING (CABG) (N/A) AORTIC VALVE REPLACEMENT (AVR) (N/A)  Subjective: Patient is eating breakfast and is without complaints. He hopes to go to SNF today.  Objective: Vital signs in last 24 hours: Temp:  [97.7 F (36.5 C)-98.2 F (36.8 C)] 98.2 F (36.8 C) (11/08 0422) Pulse Rate:  [60-66] 60  (11/08 0422) Cardiac Rhythm:  [-] Normal sinus rhythm;Bundle branch block (11/07 2028) Resp:  [17-18] 18  (11/08 0422) BP: (135-160)/(52-70) 140/65 mmHg (11/08 0422) SpO2:  [95 %-99 %] 95 % (11/08 0422) Weight:  [147 lb 14.9 oz (67.1 kg)] 147 lb 14.9 oz (67.1 kg) (11/08 0422)  Pre op weight  63.3 kg Current Weight  06/22/12 147 lb 14.9 oz (67.1 kg)     Intake/Output from previous day: 11/07 0701 - 11/08 0700 In: 1443 [P.O.:1440; I.V.:3] Out: 1001 [Urine:1000; Stool:1]   Physical Exam:  Cardiovascular: RRR, no murmurs Pulmonary: Clear to auscultation bilaterally; no rales, wheezes, or rhonchi. Abdomen: Soft, non tender, bowel sounds present. Extremities: Trace bilateral lower extremity edema. Wounds: Clean and dry.  No erythema or signs of infection.  Lab Results: CBC: Basename 06/20/12 0500  WBC 12.0*  HGB 9.4*  HCT 28.6*  PLT 261   BMET:  Basename 06/20/12 0500  NA 136  K 3.5  CL 99  CO2 25  GLUCOSE 145*  BUN 51*  CREATININE 2.41*  CALCIUM 8.8    PT/INR:  Lab Results  Component Value Date   INR 1.55* 06/13/2012   INR 1.06 06/12/2012   INR 1.06 06/04/2010   ABG:  INR: Will add last result for INR, ABG once components are confirmed Will add last 4 CBG results once components are confirmed  Assessment/Plan:  1. CV - Previous afib with RVR.Maintaining SR.On Amiodarone 400 bid, Hydralazine 25 tid, and Lopressor 12.5 bid.Will decrease Amiodarone to 200 bid as HR low 60's. 2.  Pulmonary -  Encourage incentive spirometer 3. Volume Overload - On lasix 80 bid 4.  Acute blood loss anemia - Last H and H 9.4 and 28.6. On Aranesp 5.Last creatinine 2.41. He has a history of CKD (baseline CR 2.8-3.5) 6.DM-CBGs 155/111/90.Continue Glipizide. Pre op HGA1C 6.7 7. To SNF when bed available  ZIMMERMAN,DONIELLE MPA-C 06/22/2012,7:52 AM   patient would like to go home with wife and step daughter who is a Marine scientist. Family working on home care plans, if not able to work out will need snf I have seen and examined Collin Pearson and agree with the above assessment  and plan.  Grace Isaac MD [place] [phone] Office [phone] 06/22/2012 10:41 AM

## 2012-06-22 NOTE — Progress Notes (Signed)
9 Days Post-Op Procedure(s) (LRB):  CORONARY ARTERY BYPASS GRAFTING (CABG) (N/A)  AORTIC VALVE REPLACEMENT (AVR  Subjective:   Objective: Vital signs in last 24 hours: Temp:  [97.7 F (36.5 C)-98.2 F (36.8 C)] 98.2 F (36.8 C) (11/08 0422) Pulse Rate:  [60-66] 60  (11/08 1036) Resp:  [17-18] 18  (11/08 0422) BP: (135-160)/(50-69) 141/50 mmHg (11/08 1036) SpO2:  [95 %-99 %] 95 % (11/08 0422) Weight:  [67.1 kg (147 lb 14.9 oz)] 67.1 kg (147 lb 14.9 oz) (11/08 0422) Weight change: 0 kg (0 lb) Last BM Date: 06/21/12 Intake/Output from previous day:   +442 11/07 0701 - 11/08 0700 In: 1443 [P.O.:1440; I.V.:3] Out: 1001 [Urine:1000; Stool:1] Intake/Output this shift: Total I/O In: 240 [P.O.:240] Out: -   PE: General:- up in chair no complaints  Heart:S1S2 RRR Lungs:clear Abd:+ BS soft non tender Ext:+ edema    Lab Results:  Basename 06/20/12 0500  WBC 12.0*  HGB 9.4*  HCT 28.6*  PLT 261   BMET  Basename 06/20/12 0500  NA 136  K 3.5  CL 99  CO2 25  GLUCOSE 145*  BUN 51*  CREATININE 2.41*  CALCIUM 8.8   Lab Results  Component Value Date   HGBA1C 6.7* 06/12/2012     Lab Results  Component Value Date   TSH 2.764 06/06/2012      Studies/Results: No results found.  Medications: I have reviewed the patient's current medications.    Marland Kitchen amiodarone  200 mg Oral BID  . antiseptic oral rinse  15 mL Mouth Rinse BID  . aspirin EC  325 mg Oral Daily  . darbepoetin (ARANESP) injection - NON-DIALYSIS  150 mcg Subcutaneous Q Mon-1800  . enoxaparin  30 mg Subcutaneous Q24H  . furosemide  80 mg Oral BID  . glipiZIDE  5 mg Oral BID AC  . hydrALAZINE  25 mg Oral TID  . insulin aspart  0-24 Units Subcutaneous TID AC & HS  . metoprolol tartrate  12.5 mg Oral BID  . pantoprazole  40 mg Oral Q1200  . sodium chloride  3 mL Intravenous Q12H  . Tamsulosin HCl  0.4 mg Oral Daily  . [DISCONTINUED] amiodarone  400 mg Oral Q12H  . [DISCONTINUED] amiodarone  400 mg Oral  Daily   Assessment/Plan: Principal Problem:  *S/P CABG x 1 with tissue AVR 06/13/12 Active Problems:  CAD, patent stent to LAD and mid RCA with occluded OM2 branch    AS (aortic stenosis), critical stenosis  PVD, with hx of bilateral CEA at [facility], and previous bilateral carotid stenting, with high grade ISR of Rt. carotid 05/18/12  HTN (hypertension)  Dyslipidemia  Diabetes mellitus  NSTEMI (non-ST elevated myocardial infarction)  Chronic anemia  CKD (chronic kidney disease) stage 4, GFR 15-29 ml/min  PAF (paroxysmal atrial fibrillation), s/p cabg  PLAN:  Possible discharge to SNF today.  maintaing SR,    LOS: 16 days   INGOLD,LAURA R 06/22/2012, 11:15 AM  I have seen and examined the patient along with [facility] R, NP.  I have reviewed the chart, notes and new data.  I agree with NP's note.  PLAN: Excellent progress. Will follow up in clinic.  Sanda Klein, MD, [place] [phone] 06/22/2012, 4:28 PM

## 2012-06-22 NOTE — Progress Notes (Addendum)
Physical Therapy Treatment Patient Details Name: Collin Pearson MRN: [id number] DOB: 1926-11-07 Today's Date: 06/22/2012 Time: 0930-1001 PT Time Calculation (min): 31 min  PT Assessment / Plan / Recommendation Comments on Treatment Session  Pt s/p CABG and AVR.  Continues to improve with static and dynamic balance, mobility, and strength.  Pt walked >641ft with RW on RA, maintianing balance, following sternal precautions, and able to self correct positioning within RW 80% of the time.  Pt would benefit from continued skilled therapy services for strength, balance, endurance, and safety prior to returning home with his wife unless other family members can care for pt 24 hours.     Follow Up Recommendations  SNF if no 24 hour care.  HHPT f/u if patient has 24 hour care.     Does the patient have the potential to tolerate intense rehabilitation  No, Recommend SNF if no 24 hour care  Barriers to Discharge        Equipment Recommendations  Rolling walker with 5" wheels;3 in 1 bedside comode    Recommendations for Other Services    Frequency Min 3X/week   Plan Discharge plan remains appropriate;Frequency remains appropriate    Precautions / Restrictions Precautions Precautions: Sternal;Fall Restrictions Weight Bearing Restrictions: No   Pertinent Vitals/Pain HR 67 at rest, 89 with walking O2 sats 97% at rest, 92% with walking (and talking) Some pain (L LE, Chest -surgical pain)    Mobility  Bed Mobility Bed Mobility: Not assessed Transfers Transfers: Sit to Stand;Stand to Sit Sit to Stand: 4: Min assist;Without upper extremity assist;From chair/3-in-1 Stand to Sit: 4: Min guard;Without upper extremity assist;To chair/3-in-1 Details for Transfer Assistance: VC for reminders of sternal precautions and proper body positioning  Ambulation/Gait Ambulation/Gait Assistance: 4: Min guard Ambulation Distance (Feet):  (>689ft) Assistive device: Rolling walker Ambulation/Gait  Assistance Details: Pt required less vc for postural checks and appropriate use of RW; pt recognized positioning with RW, but required education on turning with walker.  Pt walked for 12-10min, carried on full conversation, with minimal balance checks which pt managed independently. Gait Pattern: Step-through pattern;Decreased step length - right;Decreased step length - left;Trunk flexed Gait velocity: slowed General Gait Details: Antalgic on L LE, decreased by end of walking session.   Stairs: No Wheelchair Mobility Wheelchair Mobility: No    Exercises Other Exercises Other Exercises: 1x10 seated marches, standing marches, and LAQ. bilat.        PT Goals Acute Rehab PT Goals PT Goal: Sit to Stand - Progress: Progressing toward goal PT Goal: Stand to Sit - Progress: Progressing toward goal PT Goal: Ambulate - Progress: Progressing toward goal  Visit Information  Last PT Received On: 06/22/12 Assistance Needed: +1    Subjective Data  Subjective: "I'm feeling well today and I'm ready to walk."  Patient Stated Goal: To leave the hospital today.   Cognition  Overall Cognitive Status: Appears within functional limits for tasks assessed/performed Arousal/Alertness: Awake/alert Orientation Level: Appears intact for tasks assessed Behavior During Session: [facility] for tasks performed Cognition - Other Comments: Able to carry on conversation of past and present events while walking    Balance  Static Standing Balance Static Standing - Balance Support: During functional activity;Bilateral upper extremity supported Static Standing - Level of Assistance: 5: Stand by assistance High Level Balance High Level Balance Comments: Pt able to maintain static standing balance while cleaning his glasses.  Pt demonstrated improved balance with initial sit to stand as well.   End of Session PT -  End of Session Equipment Utilized During Treatment: Gait belt Activity Tolerance: Patient tolerated treatment  well (Pt willing and wanting to cont. walking for a while)       Zannie Kehr 06/22/2012, 10:29 AM  Zannie Kehr, SPT Acute Rehab Services [phone]  [facility] Rehabilitation [phone] (pager)

## 2012-06-22 NOTE — Progress Notes (Signed)
[id number] Education completed with pt. Left exercise ed and diet sheets for wife to review. Permission given by pt to refer to [place] [facility].  Discussed with pt that he has to have someone walk with him as he needs help getting up and still needs steadying at times.Ashika Apuzzo DunlapRN

## 2012-06-22 NOTE — Discharge Summary (Addendum)
Physician Discharge Summary  Patient ID: Collin Pearson MRN: [id number] DOB/AGE: 1926-09-08 78 y.o.  Admit date: 06/06/2012 Discharge date: 06/22/2012  Admission Diagnoses: 1.Severe aortic stenosis 2.History of CAD 3.NSTEMI 4.History of CKD (stage IV) 5.History of PVD 6.History of DM 7.History of hypertension 8. History of GERD 9.History of bilateral carotid stenosis (s/p CEA and stents) 10.History of clotting disorder 11.Hyperparathyroidism  Discharge Diagnoses:  1.Severe aortic stenosis 2.History of CAD 3.NSTEMI 4.History of CKD (stage IV) 5.History of PVD 6.History of DM 7.History of hypertension 8. History of GERD 9.History of bilateral carotid stenosis (s/p CEA and stents) 10.History of clotting disorder 11.Post op afib 12.Anemia 13. Hyperparathyroidism  Consults: nephrology (Dr. Jimmy Footman) and vascular surgery (Dr. Oneida Alar)  Procedure (s): Aortic valve replacement with pericardial tissue  valve, [facility], model 3300TFX, serial number [id number] and  coronary artery bypass grafting to the posterior descending coronary  artery with left leg endovein harvesting by Dr. Servando Snare on 06/13/2012.   History of Presenting Illness: This is an 78 year old male with long history of critical aortic stenosis. Most recent echocardiogram showed a velocity across his aortic valve of 477 cm/s. He has long-standing history of cardiovascular disease having had carotid endarterectomies bilaterally 25 years ago and stents placed in both carotidee arteries. He also has known coronary occlusive disease with multiple coronary stents. He also has stage IV renal disease.  He was seen in consultation by Dr. Servando Snare regarding  the risks and options of aortic valve replacement in the setting of diffuse vascular disease and end-stage renal disease, that likely will result in dialysis.  The patient previously had no cardiac symptoms at rest or light activity around the house. He was  digging holes in the yard several weeks ago  without chest pain. He does note though with much exertion he'll have chest discomfort with relief quickly when he stops. The day before admission, he had an episode of chest discomfort and sob while in tub and could not get out. He has had no syncope. Since admission, he fell in his room (while on heparin) and bruised left arm and broke several ribs. Pre op carotid US apparently showed moderate, asymptomatic recurrent stenosis of right carotid stent and the left internal carotid artery had no significant stenosis. ABI's were 0.71 on the right and 1.04 on the left. A vascular consult was obtained with Dr. Oneida Alar. He reviewed the recent carotid duplex report.It was decided that he did not need surgical intervention of the right internal carotid artery. Dr. Oneida Alar was also consulted regarding the possibility of access for long term dialysis.Vein mapping was done. Nephrology was also consulted as patient has a history of stage IV CKD, although he has not been started on dialysis yet. Dr.Gerhardt discussed with the patient that he needed to undergo an AVR and single vessel coronary artery bypass grafting surgery. Potential risks, complications, and benefits of the surgery were discussed with the patient and he agreed to proceed. He underwent an AVR and CABGx1 on 06/13/2012.  Brief Hospital Course:  He was extubated the morning of post operative day one.His [name], a line, and chest tubes  were removed early in his post operative course.His foley did remain a couple of days for close UO monitoring as he was being diuresed.He was initially A paced and on nitroglycerin. He was weaned off his Insulin drip, started on Insulin, and was restarted on Glipizide, once he was tolerating a diet. He was started on Lopressor and restarted on Hydralazine for better blood  pressure control. He was volume overloaded and diuresed. Nephrology did follow post operatively.He slowly  progressed with cardiac rehab and physical therapy. He went into afib with RVR. He was started on an Amiodarone gttp. He converted to SR and was changed to oral Amiodarone.He was felt surgically stable for transfer from the ICU to [facility] for further convalescence on 06/17/2012.Epicardial pacing wires and chest tube sutures have been removed. He had previous nausea and diarrhea, both of which have resolved.He has been tolerating a diet and has had a bowel movement. He then developed pain in his left leg and ankle. He was already on Lovenox. Duplex US showed no evidence of DVT, superficial thrombosis, or Baker's cyst. Patient is felt surgically stable for discharge to SNF today.    Latest Vital Signs: Blood pressure 140/65, pulse 60, temperature 98.2 F (36.8 C), temperature source Oral, resp. rate 18, height 5' 5.5" (1.664 m), weight 147 lb 14.9 oz (67.1 kg), SpO2 95.00%.  Physical Exam: Cardiovascular: RRR, no murmurs  Pulmonary: Clear to auscultation bilaterally; no rales, wheezes, or rhonchi.  Abdomen: Soft, non tender, bowel sounds present.  Extremities: Trace bilateral lower extremity edema.  Wounds: Clean and dry. No erythema or signs of infection.   Discharge Condition:Stable  Recent laboratory studies:  Lab Results  Component Value Date   WBC 12.0* 06/20/2012   HGB 9.4* 06/20/2012   HCT 28.6* 06/20/2012   MCV 89.4 06/20/2012   PLT 261 06/20/2012   Lab Results  Component Value Date   NA 136 06/20/2012   K 3.5 06/20/2012   CL 99 06/20/2012   CO2 25 06/20/2012   CREATININE 2.41* 06/20/2012   GLUCOSE 145* 06/20/2012      Diagnostic Studies: Dg Chest 2 View  06/19/2012  *RADIOLOGY REPORT*  Clinical Data: Status post bypass grafting surgery with persistent chest pain  CHEST - 2 VIEW  Comparison: 06/16/2012  Findings: The heart and pulmonary vascularity are within normal limits.  A stent is again noted in the right neck.  The previously seen central line has been removed in the interval.   The lungs are well aerated although some persistent left lower lobe atelectasis is seen.  No pneumothorax is noted.  A small left pleural effusion is again seen.  IMPRESSION: Stable changes in the left base.  No new focal abnormality is noted.   Original Report Authenticated By: Inez Catalina, M.D.     Discharge Orders    Future Appointments: Provider: Department: Dept Phone: Center:   07/09/2012 2:00 PM [facility] [place] [phone] [facility]      Discharge Medications:   Medication List     As of 06/22/2012 11:10 AM    STOP taking these medications         aspirin 81 MG chewable tablet      atenolol 50 MG tablet   Commonly known as: TENORMIN      clopidogrel 75 MG tablet   Commonly known as: PLAVIX      NIFEdipine 90 MG 24 hr tablet   Commonly known as: ADALAT CC      torsemide 10 MG tablet   Commonly known as: DEMADEX      TAKE these medications         amiodarone 200 MG tablet   Commonly known as: PACERONE   Take 1 tablet (200 mg total) by mouth 2 (two) times daily. for one week. Finally, take Amiodarone 200 mg po daily thereafter.  aspirin 325 MG EC tablet   Take 1 tablet (325 mg total) by mouth daily.      atorvastatin 40 MG tablet   Commonly known as: LIPITOR   Take 40 mg by mouth at bedtime.      clotrimazole-betamethasone cream   Commonly known as: LOTRISONE   Apply 1 application topically as needed. To affected area      epoetin alfa 10000 UNIT/ML injection   Commonly known as: EPOGEN,PROCRIT   Inject 10,000 Units into the skin. Every 7 days if hema is below 11      fenofibrate 145 MG tablet   Commonly known as: TRICOR   Take 145 mg by mouth daily.      ferrous fumarate 325 (106 FE) MG Tabs   Commonly known as: HEMOCYTE - 106 mg FE   Take 65 mg of iron by mouth 2 (two) times daily.      fish oil-omega-3 fatty acids 1000 MG capsule   Take 1 g by mouth 3 (three) times daily.      furosemide 80 MG  tablet   Commonly known as: LASIX   Take 1 tablet (80 mg total) by mouth 2 (two) times daily.      glipiZIDE 10 MG tablet   Commonly known as: GLUCOTROL   Take 5-10 mg by mouth 2 (two) times daily before a meal. Takes 10 mg in the morning and 5 mg in the evening      hydrALAZINE 25 MG tablet   Commonly known as: APRESOLINE   Take 25 mg by mouth 3 (three) times daily.      metoprolol tartrate 25 MG tablet   Commonly known as: LOPRESSOR   Take 0.5 tablets (12.5 mg total) by mouth 2 (two) times daily.      multivitamin with minerals Tabs   Take 1 tablet by mouth daily.      niacin 500 MG tablet   Take 500 mg by mouth 2 (two) times daily with a meal.      OVER THE COUNTER MEDICATION   Take 0.25 tablets by mouth at bedtime. Legatrin for leg cramps      pantoprazole 40 MG tablet   Commonly known as: PROTONIX   Take 40 mg by mouth daily.      solifenacin 5 MG tablet   Commonly known as: VESICARE   Take 5 mg by mouth daily.      Tamsulosin HCl 0.4 MG Caps   Commonly known as: FLOMAX   Take 0.4 mg by mouth daily.      traMADol 50 MG tablet   Commonly known as: ULTRAM   Take 1 tablet (50 mg total) by mouth every 8 (eight) hours as needed for pain.           The patient has been discharged on:   1.Beta Blocker:  Yes [  x ]                              No   [   ]                              If No, reason:  2.Ace Inhibitor/ARB: Yes [   ]  No  [   x ]                                     If No, reason: Preserved LVEF 55%,CR 2.4  3.Statin:   Yes [  x ]                  No  [   ]                  If No, reason:  4.Ecasa:  Yes  [   x]                  No   [   ]                  If No, reason:   Follow Up Appointments:     Follow-up Information    Follow up with Lorretta Harp, MD. Today. (Our office will call with the appt. date and time.)    Contact information:   [address] [place] [place]  [address] [phone]       Follow up with GERHARDT,EDWARD B, MD. (PA/LAT CXR to be taken (at [facility] which is in the same building as Dr. Everrett Coombe office) on 07/09/2012 at 1:00 pm;Appointment with Dr. Everrett Coombe physician assistant is on 07/09/2012 at 2:00 pm)    Contact information:   [place] [address] [phone]       Follow up with CAMPBELL,STEPHEN, MD. (Call for a follow up appointment regarding further surveillance of HGA1C 6.7)       Follow up with Ulla Potash., MD. (Call for a follow up appointment)    Contact information:   [place] [address] [phone]          Signed: Derek Huneycutt MPA-C 06/22/2012, 9:07 AM

## 2012-06-22 NOTE — Progress Notes (Signed)
[facility] [phone] (pager)

## 2012-06-25 NOTE — Clinical Social Work Placement (Signed)
Late enter 06/25/12 for 06/22/12   Clinical Social Work Department CLINICAL SOCIAL WORK PLACEMENT NOTE 06/25/2012  Patient:  Collin Pearson, Collin Pearson  Account Number:  [id number] Admit date:  06/06/2012  Clinical Social Worker:  Chrys Racer, LCSW  Date/time:  06/22/2012 03:00 PM  Clinical Social Work is seeking post-discharge placement for this patient at the following level of care:   [place]   (*CSW will update this form in [place] as items are completed)   06/19/2012  Patient/family provided with [facility] Department of Clinical Social Work's list of facilities offering this level of care within the geographic area requested by the patient (or if unable, by the patient's family).  06/19/2012  Patient/family informed of their freedom to choose among providers that offer the needed level of care, that participate in Medicare, Medicaid or managed care program needed by the patient, have an available bed and are willing to accept the patient.  06/19/2012  Patient/family informed of [facility]' ownership interest in [facility], as well as of the fact that they are under no obligation to receive care at this facility.  PASARR submitted to EDS on 06/19/2012 PASARR number received from [employer] on 06/19/2012  FL2 transmitted to all facilities in geographic area requested by pt/family on  06/19/2012 FL2 transmitted to all facilities within larger geographic area on   Patient informed that his/her managed care company has contracts with or will negotiate with  certain facilities, including the following:   None in the area     Patient/family informed of bed offers received:  06/20/2012 Patient chooses bed at [facility] Physician recommends and patient chooses bed at    Patient to be transferred to [facility] on  06/22/2012 Patient to be transferred to facility by family  The following physician request were entered in Epic:   Additional  Comments: [employer] was primary, preauth obtained.

## 2012-06-27 ENCOUNTER — Encounter: Payer: Self-pay | Admitting: Cardiothoracic Surgery

## 2012-06-28 ENCOUNTER — Other Ambulatory Visit (HOSPITAL_COMMUNITY): Payer: Self-pay | Admitting: *Deleted

## 2012-06-28 DIAGNOSIS — I10 Essential (primary) hypertension: Secondary | ICD-10-CM

## 2012-07-05 ENCOUNTER — Other Ambulatory Visit: Payer: Self-pay | Admitting: Cardiothoracic Surgery

## 2012-07-05 DIAGNOSIS — J811 Chronic pulmonary edema: Secondary | ICD-10-CM

## 2012-07-05 DIAGNOSIS — I2510 Atherosclerotic heart disease of native coronary artery without angina pectoris: Secondary | ICD-10-CM

## 2012-07-09 ENCOUNTER — Encounter: Payer: Self-pay | Admitting: *Deleted

## 2012-07-09 ENCOUNTER — Ambulatory Visit (INDEPENDENT_AMBULATORY_CARE_PROVIDER_SITE_OTHER): Payer: Self-pay | Admitting: Physician Assistant

## 2012-07-09 ENCOUNTER — Ambulatory Visit
Admission: RE | Admit: 2012-07-09 | Discharge: 2012-07-09 | Disposition: A | Discharge status: Home / Self Care | Payer: PRIVATE HEALTH INSURANCE | Source: Ambulatory Visit | Attending: Cardiothoracic Surgery | Admitting: Cardiothoracic Surgery

## 2012-07-09 VITALS — BP 127/72 | HR 76 | Resp 16 | Ht 65.5 in | Wt 133.5 lb

## 2012-07-09 DIAGNOSIS — J811 Chronic pulmonary edema: Secondary | ICD-10-CM

## 2012-07-09 DIAGNOSIS — I2510 Atherosclerotic heart disease of native coronary artery without angina pectoris: Secondary | ICD-10-CM

## 2012-07-09 DIAGNOSIS — Z951 Presence of aortocoronary bypass graft: Secondary | ICD-10-CM

## 2012-07-09 NOTE — Progress Notes (Signed)
HPI: Patient returns for routine postoperative follow-up having undergone AVR/ CABG x 1 on 06/13/2012 The patient's early postoperative recovery while in the hospital was notable for Atrial Fibrillation and Deconditioning with placement into acute rehab. Since hospital discharge the patient reports he is making progress.  He is currently admitted to [facility] rehabilitation center.  He is ambulating with assist of a walker or a cane.  The patient has had some episodic vomiting after ingestion of poor tasting items.  This has happened twice.  He denies chest pain and shortness of breath.  He has been evaluated by his Cardiologist and his medications were adjusted.    Current Outpatient Prescriptions  Medication Sig Dispense Refill  . amiodarone (PACERONE) 200 MG tablet Take 1 tablet (200 mg total) by mouth 2 (two) times daily. for one week. Finally, take Amiodarone 200 mg po daily thereafter.  30 tablet  1  . aspirin EC 325 MG EC tablet Take 1 tablet (325 mg total) by mouth daily.  30 tablet    . atorvastatin (LIPITOR) 40 MG tablet Take 40 mg by mouth at bedtime.      . clotrimazole-betamethasone (LOTRISONE) cream Apply 1 application topically as needed. To affected area      . epoetin alfa (EPOGEN,PROCRIT) 78938 UNIT/ML injection Inject 10,000 Units into the skin. Every 7 days if hema is below 11      . fenofibrate (TRICOR) 145 MG tablet Take 145 mg by mouth daily.      . ferrous fumarate (HEMOCYTE - 106 MG FE) 325 (106 FE) MG TABS Take 65 mg of iron by mouth 2 (two) times daily.      . fish oil-omega-3 fatty acids 1000 MG capsule Take 1 g by mouth 3 (three) times daily.      . furosemide (LASIX) 80 MG tablet Take 1 tablet (80 mg total) by mouth 2 (two) times daily.  60 tablet  1  . glipiZIDE (GLUCOTROL) 10 MG tablet Take 5-10 mg by mouth 2 (two) times daily before a meal. Takes 10 mg in the morning and 5 mg in the evening      . hydrALAZINE (APRESOLINE) 25 MG tablet Take 25 mg by mouth 3 (three)  times daily.      . metoprolol tartrate (LOPRESSOR) 25 MG tablet Take 0.5 tablets (12.5 mg total) by mouth 2 (two) times daily.  30 tablet  1  . Multiple Vitamin (MULTIVITAMIN WITH MINERALS) TABS Take 1 tablet by mouth daily.      . niacin 500 MG tablet Take 500 mg by mouth 2 (two) times daily with a meal.       . OVER THE COUNTER MEDICATION Take 0.25 tablets by mouth at bedtime. Legatrin for leg cramps      . pantoprazole (PROTONIX) 40 MG tablet Take 40 mg by mouth daily.      . solifenacin (VESICARE) 5 MG tablet Take 5 mg by mouth daily.       . Tamsulosin HCl (FLOMAX) 0.4 MG CAPS Take 0.4 mg by mouth daily.      . traMADol (ULTRAM) 50 MG tablet Take 1 tablet (50 mg total) by mouth every 8 (eight) hours as needed for pain.  30 tablet  0    Physical Exam:  BP 127/72  Pulse 76  Resp 16  Ht 5' 5.5" (1.664 m)  Wt 133 lb 8 oz (60.555 kg)  BMI 21.88 kg/m2  SpO2 95%  Gen: no apparent distress, appears frail Heart:  RRR, sternum  stable Lungs: CTA bilaterally Abd: soft non-tender, non-distended Skin: incisions clean and dry, appears to be a small seroma along Left LE EVH site, no acute signs of infection  Diagnostic Tests:  CXR: small posterior left pleural effusion, post surgical changes, overall improved since previous film  Impression:  Mr. Nordell is making progress since surgery.  He continues to be deconditioned but is working at a rehabilitation center to improve his overall strength.  There is a small seroma along his LLE EVH site, however the patient did not want intervention at this time and would like to see if it resolves on its own.  Plan:  We will bring Mr. Harton back in 4 weeks.  He will continue rehabilitation in [facility].  He was instructed to call our office should he develop any problems.

## 2012-07-17 ENCOUNTER — Encounter (HOSPITAL_COMMUNITY): Payer: Medicare Other

## 2012-07-23 ENCOUNTER — Other Ambulatory Visit (HOSPITAL_COMMUNITY): Payer: Self-pay | Admitting: Cardiovascular Disease

## 2012-07-23 DIAGNOSIS — Z952 Presence of prosthetic heart valve: Secondary | ICD-10-CM

## 2012-07-23 DIAGNOSIS — I350 Nonrheumatic aortic (valve) stenosis: Secondary | ICD-10-CM

## 2012-07-23 DIAGNOSIS — I2510 Atherosclerotic heart disease of native coronary artery without angina pectoris: Secondary | ICD-10-CM

## 2012-08-06 ENCOUNTER — Ambulatory Visit (HOSPITAL_COMMUNITY)
Admission: RE | Admit: 2012-08-06 | Discharge: 2012-08-06 | Disposition: A | Discharge status: Home / Self Care | Payer: PRIVATE HEALTH INSURANCE | Source: Ambulatory Visit | Attending: Cardiovascular Disease | Admitting: Cardiovascular Disease

## 2012-08-06 DIAGNOSIS — Z952 Presence of prosthetic heart valve: Secondary | ICD-10-CM

## 2012-08-06 DIAGNOSIS — I2510 Atherosclerotic heart disease of native coronary artery without angina pectoris: Secondary | ICD-10-CM

## 2012-08-06 DIAGNOSIS — I10 Essential (primary) hypertension: Secondary | ICD-10-CM | POA: Insufficient documentation

## 2012-08-06 DIAGNOSIS — I350 Nonrheumatic aortic (valve) stenosis: Secondary | ICD-10-CM

## 2012-08-06 NOTE — Progress Notes (Signed)
[facility]   2D echo completed 08/06/2012.   Jamison Neighbor, RDCS

## 2012-08-07 NOTE — Progress Notes (Signed)
Renal  Duplex Completed. [place]

## 2012-08-09 ENCOUNTER — Other Ambulatory Visit: Payer: Self-pay

## 2012-08-13 ENCOUNTER — Other Ambulatory Visit: Payer: Self-pay | Admitting: *Deleted

## 2012-08-13 DIAGNOSIS — I2510 Atherosclerotic heart disease of native coronary artery without angina pectoris: Secondary | ICD-10-CM

## 2012-08-13 DIAGNOSIS — I350 Nonrheumatic aortic (valve) stenosis: Secondary | ICD-10-CM

## 2012-08-16 ENCOUNTER — Ambulatory Visit (INDEPENDENT_AMBULATORY_CARE_PROVIDER_SITE_OTHER): Payer: Self-pay | Admitting: Cardiothoracic Surgery

## 2012-08-16 ENCOUNTER — Encounter: Payer: Self-pay | Admitting: Cardiothoracic Surgery

## 2012-08-16 ENCOUNTER — Ambulatory Visit
Admission: RE | Admit: 2012-08-16 | Discharge: 2012-08-16 | Disposition: A | Discharge status: Home / Self Care | Payer: PRIVATE HEALTH INSURANCE | Source: Ambulatory Visit | Attending: Cardiothoracic Surgery | Admitting: Cardiothoracic Surgery

## 2012-08-16 VITALS — BP 125/58 | HR 57 | Resp 16 | Ht 65.5 in | Wt 138.0 lb

## 2012-08-16 DIAGNOSIS — Z952 Presence of prosthetic heart valve: Secondary | ICD-10-CM

## 2012-08-16 DIAGNOSIS — Z951 Presence of aortocoronary bypass graft: Secondary | ICD-10-CM

## 2012-08-16 DIAGNOSIS — I2510 Atherosclerotic heart disease of native coronary artery without angina pectoris: Secondary | ICD-10-CM

## 2012-08-16 DIAGNOSIS — Z954 Presence of other heart-valve replacement: Secondary | ICD-10-CM

## 2012-08-16 DIAGNOSIS — I350 Nonrheumatic aortic (valve) stenosis: Secondary | ICD-10-CM

## 2012-08-16 DIAGNOSIS — I359 Nonrheumatic aortic valve disorder, unspecified: Secondary | ICD-10-CM

## 2012-08-16 MED ORDER — CLOTRIMAZOLE-BETAMETHASONE 1-0.05 % EX CREA: 1.0000 "application " | TOPICAL_CREAM | CUTANEOUS | Status: DC | PRN

## 2012-08-16 NOTE — Progress Notes (Signed)
[address]            [place] [address]          [phone]       [facility] Medical Record [id number] Date of Birth: 1926-09-16  Lorretta Harp, MD Jenean Lindau, MD  Chief Complaint:   PostOp Follow Up Visit DATE OF PROCEDURE: 06/13/2012  OPERATIVE REPORT  PREOPERATIVE DIAGNOSIS: Critical Aortic Stenosis and CAD and carotid disease and Stage IV renal disease  POSTOPERATIVE DIAGNOSIS: Critical Aortic Stenosis and CAD and carotid disease and Stage IV renal disease  SURGICAL PROCEDURE: Aortic valve replacement with pericardial tissue  valve, [employer], model 3300TFX, serial number [id number] and  coronary artery bypass grafting to the posterior descending coronary  artery with left leg endovein harvesting.      History of Present Illness:     Patient returns for routine postoperative follow-up having undergone AVR/ CABG x 1 on 06/13/2012  The patient's early postoperative recovery while in the hospital was notable for Atrial Fibrillation and Deconditioning with placement into acute rehab. Since hospital discharge the patient reports he is making progress.  He is now living at home, and going to cardiac rehabilitation in [place]. His overall strength is increasing. He says Dr. Posey Pronto reports that his renal function has improved. He has some mild pedal edema, otherwise no evidence of congestive heart failure or recurrent anginal symptoms.   History  Smoking status  . Never Smoker   Smokeless tobacco  . Never Used       No Known Allergies  Current Outpatient Prescriptions  Medication Sig Dispense Refill  . amiodarone (PACERONE) 200 MG tablet Take 1 tablet (200 mg total) by mouth 2 (two) times daily. for one week. Finally, take Amiodarone 200 mg po daily thereafter.  30 tablet  1  . aspirin EC 325 MG EC tablet Take 1 tablet (325 mg total) by mouth daily.  30 tablet    . atorvastatin (LIPITOR) 40 MG  tablet Take 40 mg by mouth at bedtime.      . clotrimazole-betamethasone (LOTRISONE) cream Apply 1 application topically as needed. To affected area  30 g  1  . ferrous fumarate (HEMOCYTE - 106 MG FE) 325 (106 FE) MG TABS Take 65 mg of iron by mouth 2 (two) times daily.      . fish oil-omega-3 fatty acids 1000 MG capsule Take 1 g by mouth 3 (three) times daily.      Marland Kitchen glipiZIDE (GLUCOTROL) 10 MG tablet Take 5-10 mg by mouth 2 (two) times daily before a meal. Takes 10 mg in the morning and 5 mg in the evening      . hydrALAZINE (APRESOLINE) 25 MG tablet Take 25 mg by mouth 3 (three) times daily.      . insulin aspart (NOVOLOG) 100 UNIT/ML injection Inject into the skin 4 (four) times daily -  before meals and at bedtime. SLIDING SCALE...151-200=2units, 201-250=4units, 251-300=6units,301-350=8units,351-400=10units,>400=12units and notify MD      . metoprolol tartrate (LOPRESSOR) 25 MG tablet Take 0.5 tablets (12.5 mg total) by mouth 2 (two) times daily.  30 tablet  1  . Multiple Vitamin (MULTIVITAMIN WITH MINERALS) TABS Take 1 tablet by mouth daily.      Marland Kitchen OVER THE COUNTER MEDICATION Take 0.25 tablets by mouth at bedtime. Legatrin for leg cramps      . pantoprazole (  PROTONIX) 40 MG tablet Take 40 mg by mouth daily.      . solifenacin (VESICARE) 5 MG tablet Take 5 mg by mouth daily.       . Tamsulosin HCl (FLOMAX) 0.4 MG CAPS Take 0.4 mg by mouth daily.           Physical Exam: BP 125/58  Pulse 57  Resp 16  Ht 5' 5.5" (1.664 m)  Wt 138 lb (62.596 kg)  BMI 22.61 kg/m2  SpO2 97%  General appearance: alert, cooperative and appears stated age Neurologic: intact Heart: regular rate and rhythm, S1, S2 normal, no murmur, click, rub or gallop and normal apical impulse Lungs: clear to auscultation bilaterally and normal percussion bilaterally Abdomen: soft, non-tender; bowel sounds normal; no masses,  no organomegaly Extremities: He has mild pedal edema bilaterally left slightly greater than  right, the end of vein harvest sites are healing well Wound: The sternum is stable and well healed   Diagnostic Studies & Laboratory data:         Recent Radiology Findings: Dg Chest 2 View  08/16/2012  *RADIOLOGY REPORT*  Clinical Data: Follow up CABG, left foot pain/swelling  CHEST - 2 VIEW  Comparison: 07/09/2012  Findings: Lungs are essentially clear.  No focal consolidation. No pleural effusion or pneumothorax.  Cardiomediastinal silhouette is within normal limits. Postsurgical changes related to prior CABG.  Prosthetic aortic valve.  Degenerative changes of the visualized thoracolumbar spine.  IMPRESSION: No evidence of acute cardiopulmonary disease.   Original Report Authenticated By: Julian Hy, M.D.       Recent Labs: Lab Results  Component Value Date   WBC 12.0* 06/20/2012   HGB 9.4* 06/20/2012   HCT 28.6* 06/20/2012   PLT 261 06/20/2012   GLUCOSE 145* 06/20/2012   ALT 129* 06/18/2012   AST 132* 06/18/2012   NA 136 06/20/2012   K 3.5 06/20/2012   CL 99 06/20/2012   CREATININE 2.41* 06/20/2012   BUN 51* 06/20/2012   CO2 25 06/20/2012   TSH 2.764 06/06/2012   INR 1.55* 06/13/2012   HGBA1C 6.7* 06/12/2012      Assessment / Plan:      Overall patient is making very good progress following aortic valve replacement and coronary artery bypass graft. He is now actively involved in outpatient cardiac rehabilitation and doing well with this. Please with his progress. I've not made him a return appointment to see me he continues to be seen at [facility] cardiology and by nephrology. I have discussed the risks of endocarditis with he and his wife in the recommendations for antibiotic pretreatment for invasive procedures and dental work.      Reed Eifert B 08/16/2012 1:51 PM

## 2012-08-16 NOTE — Patient Instructions (Addendum)
Endocarditis Information  You may be at risk for developing endocarditis since you have  an artificial heart valve  or a repaired heart valve. Endocarditis is an infection of the lining of the heart or heart valves.   Certain surgical and dental procedures may put you at risk,  such as teeth cleaning or other dental procedures or any surgery involving the respiratory, urinary, gastrointestinal tract, gallbladder or prostate.   Notify your doctor or dentist before having any invasive procedures. You will need to take antibiotics before certain procedures.   To prevent endocarditis, maintain good oral health. Seek prompt medical attention for any mouth/gum, skin or urinary tract infections.        Aortic Valve Replacement Care After Read the instructions outlined below and refer to this sheet for the next few weeks. These discharge instructions provide you with general information on caring for yourself after you leave the hospital. Your surgeon may also give you specific instructions. While your treatment has been planned according to the most current medical practices available, unavoidable complications occasionally occur. If you have any problems or questions after discharge, please call your surgeon. AFTER THE PROCEDURE  Full recovery from heart valve surgery can take several months.  Blood thinning (anticoagulation) treatment with warfarin is often prescribed for 6 weeks to 3 months after surgery for those with biological valves. It is prescribed for life for those with mechanical valves.  Recovery includes healing of the surgical incision. There is a gradual building of stamina and exercise abilities. An exercise program under the direction of a physical therapist may be recommended.  Once you have an artificial valve, your heart function and your life will return to normal. You usually feel better after surgery. Shortness of breath and fatigue should lessen. If your heart was already  severely damaged before your surgery, you may continue to have problems.  You can usually resume most of your normal activities. You will have to continue to monitor your condition. You need to watch out for blood clots and infections.  Artificial valves need to be replaced after a period of time. It is important that you see your caregiver regularly.  Some individuals with an aortic valve replacement need to take antibiotics before having dental work or other surgical procedures. This is called prophylactic antibiotic treatment. These drugs help to prevent infective endocarditis. Antibiotics are only recommended for individuals with the highest risk for developing infective endocarditis. Let your dentist and your caregiver know if you have a history of any of the following so that the necessary precautions can be taken:  A VSD.  A repaired VSD.  Endocarditis in the past.  An artificial (prosthetic) heart valve. HOME CARE INSTRUCTIONS   Use all medications as prescribed.  Take your temperature every morning for the first week after surgery. Record these.  Weigh yourself every morning for at least the first week after surgery and record.  Do not lift more than 10 pounds (4.5 kg) until your breastbone (sternum) has healed. Avoid all activities which would place strain on your incision.  You may shower as soon as directed by your caregiver after surgery. Pat incisions dry. Do not rub incisions with washcloth or towel.  Avoid driving for 4 to 6 weeks following surgery or as instructed.  Use your elastic stockings during the day. You should wear the stockings for at least 2 weeks after discharge or longer if your ankles are swollen. The stockings help blood flow and help reduce swelling in  the legs. It is easiest to put the stockings on before you get out of bed in the morning. They should fit snugly. Pain Control  If a prescription was given for a pain reliever, please follow your  doctor's directions.  If the pain is not relieved by your medicine, becomes worse, or you have difficulty breathing, call your surgeon. Activity  Take frequent rest periods throughout the day.  Wait one week before returning to strenuous activities such as heavy lifting (more than 10 pounds), pushing or pulling.  Talk with your doctor about when you may return to work and your exercise routine.  Do not drive while taking prescription pain medication. Nutrition  You may resume your normal diet.  Drink plenty of fluids (6-8 glasses a day).  Eat a well-balanced diet.  Call your caregiver for persistent nausea or vomiting. Elimination Your normal bowel function should return. If constipation should occur, you may:  Take a mild laxative.  Add fruit and bran to your diet.  Drink more fluids.  Call your doctor if constipation is not relieved. SEEK IMMEDIATE MEDICAL CARE IF:   You develop chest pain which is not coming from your surgical cut (incision).  You develop shortness of breath or have difficulty breathing.  You develop a temperature over 101 F (38.3 C).  You have a sudden weight gain. Let your caregiver know what the weight gain is.  You develop a rash.  You develop any reaction or side effects to medications given.  You have increased bleeding from wounds.  You see redness, swelling, or have increasing pain in wounds.  You have pus coming from your wound.  You develop lightheadedness or feel faint. Document Released: 02/17/2005 Document Revised: 10/24/2011 Document Reviewed: 05/11/2005 [facility] Patient Information 2013 [place].

## 2012-09-12 ENCOUNTER — Other Ambulatory Visit (HOSPITAL_COMMUNITY): Payer: Self-pay | Admitting: Cardiovascular Disease

## 2012-09-12 DIAGNOSIS — I6529 Occlusion and stenosis of unspecified carotid artery: Secondary | ICD-10-CM

## 2012-09-20 ENCOUNTER — Ambulatory Visit (HOSPITAL_COMMUNITY)
Admission: RE | Admit: 2012-09-20 | Discharge: 2012-09-20 | Disposition: A | Discharge status: Home / Self Care | Payer: PRIVATE HEALTH INSURANCE | Source: Ambulatory Visit | Attending: Cardiovascular Disease | Admitting: Cardiovascular Disease

## 2012-09-20 DIAGNOSIS — I6529 Occlusion and stenosis of unspecified carotid artery: Secondary | ICD-10-CM | POA: Insufficient documentation

## 2012-09-20 NOTE — Progress Notes (Signed)
Carotid Duplex Complete Terald Sleeper

## 2012-09-21 NOTE — Progress Notes (Signed)
Carotid Duplex Completed. 

## 2012-09-27 ENCOUNTER — Encounter (HOSPITAL_COMMUNITY): Payer: PRIVATE HEALTH INSURANCE

## 2012-10-03 ENCOUNTER — Other Ambulatory Visit: Payer: Self-pay | Admitting: *Deleted

## 2012-10-03 DIAGNOSIS — I2510 Atherosclerotic heart disease of native coronary artery without angina pectoris: Secondary | ICD-10-CM

## 2012-10-03 DIAGNOSIS — I739 Peripheral vascular disease, unspecified: Secondary | ICD-10-CM

## 2012-10-04 ENCOUNTER — Ambulatory Visit (INDEPENDENT_AMBULATORY_CARE_PROVIDER_SITE_OTHER): Payer: PRIVATE HEALTH INSURANCE | Admitting: Cardiothoracic Surgery

## 2012-10-04 ENCOUNTER — Ambulatory Visit
Admission: RE | Admit: 2012-10-04 | Discharge: 2012-10-04 | Disposition: A | Discharge status: Home / Self Care | Payer: PRIVATE HEALTH INSURANCE | Source: Ambulatory Visit | Attending: Cardiothoracic Surgery | Admitting: Cardiothoracic Surgery

## 2012-10-04 ENCOUNTER — Encounter: Payer: Self-pay | Admitting: Cardiothoracic Surgery

## 2012-10-04 VITALS — BP 107/51 | HR 57 | Resp 16 | Ht 65.5 in | Wt 148.0 lb

## 2012-10-04 DIAGNOSIS — K439 Ventral hernia without obstruction or gangrene: Secondary | ICD-10-CM

## 2012-10-04 DIAGNOSIS — I2510 Atherosclerotic heart disease of native coronary artery without angina pectoris: Secondary | ICD-10-CM

## 2012-10-04 DIAGNOSIS — I359 Nonrheumatic aortic valve disorder, unspecified: Secondary | ICD-10-CM

## 2012-10-04 DIAGNOSIS — Z954 Presence of other heart-valve replacement: Secondary | ICD-10-CM

## 2012-10-04 DIAGNOSIS — Z951 Presence of aortocoronary bypass graft: Secondary | ICD-10-CM

## 2012-10-04 DIAGNOSIS — I350 Nonrheumatic aortic (valve) stenosis: Secondary | ICD-10-CM

## 2012-10-04 DIAGNOSIS — Z952 Presence of prosthetic heart valve: Secondary | ICD-10-CM

## 2012-10-04 DIAGNOSIS — I739 Peripheral vascular disease, unspecified: Secondary | ICD-10-CM

## 2012-10-04 MED ORDER — CLOTRIMAZOLE-BETAMETHASONE 1-0.05 % EX CREA: 1.0000 "application " | TOPICAL_CREAM | CUTANEOUS | Status: DC | PRN

## 2012-10-04 NOTE — Progress Notes (Signed)
[address]            [place] [address]          [phone]       Fabrice H Brabec [facility] Medical Record [id number] Date of Birth: Jul 24, 1927  Lorretta Harp, MD Jenean Lindau, MD  Chief Complaint:   Bulge at lower sternal edge enlarging  DATE OF PROCEDURE: 06/13/2012  OPERATIVE REPORT  PREOPERATIVE DIAGNOSIS: Critical Aortic Stenosis and CAD and carotid disease and Stage IV renal disease  POSTOPERATIVE DIAGNOSIS: Critical Aortic Stenosis and CAD and carotid disease and Stage IV renal disease  SURGICAL PROCEDURE: Aortic valve replacement with pericardial tissue  valve, [employer], model 3300TFX, serial number [id number] and  coronary artery bypass grafting to the posterior descending coronary  artery with left leg endovein harvesting.      History of Present Illness:     Patient was seen for routine postoperative follow-up having undergone AVR/ CABG x 1 on early January 2014. Since that time he is noted the development of a 3 cm bulge at the junction of the xiphoid and rectus muscle .he comes to the office today because he notes it has been gradually enlarging and is uncomfortable when sneezing or coughing .  The patient's early postoperative recovery while in the hospital was notable for Atrial Fibrillation and Deconditioning with placement into acute rehab. Since hospital discharge the patient reports he is making progress.  He is now living at home, and going to cardiac rehabilitation in [place]. His overall strength is increasing. He says Dr. Posey Pronto reports that his renal function has improved. He has some mild pedal edema, otherwise no evidence of congestive heart failure or recurrent anginal symptoms.   History  Smoking status  . Never Smoker   Smokeless tobacco  . Never Used       No Known Allergies  Current Outpatient Prescriptions  Medication Sig Dispense Refill  . amiodarone (PACERONE) 200 MG tablet  Take 1 tablet (200 mg total) by mouth 2 (two) times daily. for one week. Finally, take Amiodarone 200 mg po daily thereafter.  30 tablet  1  . aspirin EC 325 MG EC tablet Take 1 tablet (325 mg total) by mouth daily.  30 tablet    . atorvastatin (LIPITOR) 40 MG tablet Take 40 mg by mouth at bedtime.      . clotrimazole-betamethasone (LOTRISONE) cream Apply 1 application topically as needed. To affected area  30 g  1  . ferrous fumarate (HEMOCYTE - 106 MG FE) 325 (106 FE) MG TABS Take 65 mg of iron by mouth 2 (two) times daily.      . fish oil-omega-3 fatty acids 1000 MG capsule Take 1 g by mouth 3 (three) times daily.      Marland Kitchen glipiZIDE (GLUCOTROL) 10 MG tablet Take 5-10 mg by mouth 2 (two) times daily before a meal. Takes 10 mg in the morning and 5 mg in the evening      . hydrALAZINE (APRESOLINE) 25 MG tablet Take 25 mg by mouth 3 (three) times daily.      . insulin aspart (NOVOLOG) 100 UNIT/ML injection Inject into the skin 4 (four) times daily -  before meals and at bedtime. SLIDING SCALE...151-200=2units, 201-250=4units, 251-300=6units,301-350=8units,351-400=10units,>400=12units and notify MD      . metoprolol tartrate (LOPRESSOR) 25 MG tablet Take 0.5 tablets (12.5 mg total) by mouth 2 (  two) times daily.  30 tablet  1  . Multiple Vitamin (MULTIVITAMIN WITH MINERALS) TABS Take 1 tablet by mouth daily.      Marland Kitchen OVER THE COUNTER MEDICATION Take 0.25 tablets by mouth at bedtime. Legatrin for leg cramps      . pantoprazole (PROTONIX) 40 MG tablet Take 40 mg by mouth daily.      . solifenacin (VESICARE) 5 MG tablet Take 5 mg by mouth daily.       . Tamsulosin HCl (FLOMAX) 0.4 MG CAPS Take 0.4 mg by mouth daily.       No current facility-administered medications for this visit.       Physical Exam: BP 107/51  Pulse 57  Resp 16  Ht 5' 5.5" (1.664 m)  Wt 148 lb (67.132 kg)  BMI 24.25 kg/m2  SpO2 98%  General appearance: alert, cooperative and appears stated age Neurologic: intact Heart:  regular rate and rhythm, S1, S2 normal, no murmur, click, rub or gallop and normal apical impulse Lungs: clear to auscultation bilaterally and normal percussion bilaterally Abdomen: soft, non-tender; bowel sounds normal; no masses,  no organomegaly, he now has a 3 cm fascial defect at the xiphoid which is easily reducible Extremities: He has mild pedal edema bilaterally left slightly greater than right, the end of vein harvest sites are healing well Wound: The sternum is stable and well healed   Diagnostic Studies & Laboratory data:         Recent Radiology Findings: Dg Chest 2 View  10/04/2012  *RADIOLOGY REPORT*  Clinical Data: Recent heart surgery with chest pain.  CHEST - 2 VIEW  Comparison: 08/16/2012.  Findings: Trachea is midline.  Heart size normal.  Thoracic aorta is calcified.  A vascular stent is seen in the right neck.  Lungs are clear.  No pleural fluid.  Right hemidiaphragm is mildly elevated.  IMPRESSION: No acute findings.   Original Report Authenticated By: Lorin Picket, M.D.       Recent Labs: Lab Results  Component Value Date   WBC 12.0* 06/20/2012   HGB 9.4* 06/20/2012   HCT 28.6* 06/20/2012   PLT 261 06/20/2012   GLUCOSE 145* 06/20/2012   ALT 129* 06/18/2012   AST 132* 06/18/2012   NA 136 06/20/2012   K 3.5 06/20/2012   CL 99 06/20/2012   CREATININE 2.41* 06/20/2012   BUN 51* 06/20/2012   CO2 25 06/20/2012   TSH 2.764 06/06/2012   INR 1.55* 06/13/2012   HGBA1C 6.7* 06/12/2012      Assessment / Plan:      Overall patient is making very good progress following aortic valve replacement and coronary artery bypass graft. He is now actively involved in outpatient cardiac rehabilitation and doing well with this. Please with his progress.    I discussed with him the diagnosis of epigastric /ventral hernia .because of it slowly enlarging size and discomfort to him he prefers to proceed with surgical repair .I discussed the procedure with him and recommended hernia repair  with mesh . He will discuss this with his wife and call back to set a surgical date for elective repair .     Tocarra Gassen B 10/04/2012 9:20 AM

## 2012-10-04 NOTE — Patient Instructions (Signed)
Call back to schedule hernia repair See Dr Posey Pronto about kidney function Hernia A hernia occurs when an internal organ pushes out through a weak spot in the abdominal wall. Hernias most commonly occur in the groin and around the navel. Hernias often can be pushed back into place (reduced). Most hernias tend to get worse over time. Some abdominal hernias can get stuck in the opening (irreducible or incarcerated hernia) and cannot be reduced. An irreducible abdominal hernia which is tightly squeezed into the opening is at risk for impaired blood supply (strangulated hernia). A strangulated hernia is a medical emergency. Because of the risk for an irreducible or strangulated hernia, surgery may be recommended to repair a hernia. CAUSES   Heavy lifting.  Prolonged coughing.  Straining to have a bowel movement.  A cut (incision) made during an abdominal surgery. HOME CARE INSTRUCTIONS   Bed rest is not required. You may continue your normal activities.  Avoid lifting more than 10 pounds (4.5 kg) or straining.  Cough gently. If you are a smoker it is best to stop. Even the best hernia repair can break down with the continual strain of coughing. Even if you do not have your hernia repaired, a cough will continue to aggravate the problem.  Do not wear anything tight over your hernia. Do not try to keep it in with an outside bandage or truss. These can damage abdominal contents if they are trapped within the hernia sac.  Eat a normal diet.  Avoid constipation. Straining over long periods of time will increase hernia size and encourage breakdown of repairs. If you cannot do this with diet alone, stool softeners may be used. SEEK IMMEDIATE MEDICAL CARE IF:   You have a fever.  You develop increasing abdominal pain.  You feel nauseous or vomit.  Your hernia is stuck outside the abdomen, looks discolored, feels hard, or is tender.  You have any changes in your bowel habits or in the hernia that  are unusual for you.  You have increased pain or swelling around the hernia.  You cannot push the hernia back in place by applying gentle pressure while lying down. MAKE SURE YOU:   Understand these instructions.  Will watch your condition.  Will get help right away if you are not doing well or get worse. Document Released: 08/01/2005 Document Revised: 10/24/2011 Document Reviewed: 03/20/2008 [facility] Patient Information 2013 [place].

## 2013-01-15 ENCOUNTER — Encounter: Payer: Self-pay | Admitting: Cardiovascular Disease

## 2013-03-06 ENCOUNTER — Other Ambulatory Visit (HOSPITAL_COMMUNITY): Payer: Self-pay | Admitting: Cardiovascular Disease

## 2013-03-06 ENCOUNTER — Telehealth (HOSPITAL_COMMUNITY): Payer: Self-pay | Admitting: Cardiovascular Disease

## 2013-03-06 DIAGNOSIS — I6529 Occlusion and stenosis of unspecified carotid artery: Secondary | ICD-10-CM

## 2013-04-02 ENCOUNTER — Ambulatory Visit (HOSPITAL_COMMUNITY)
Admission: RE | Admit: 2013-04-02 | Discharge: 2013-04-02 | Disposition: A | Discharge status: Home / Self Care | Payer: PRIVATE HEALTH INSURANCE | Source: Ambulatory Visit | Attending: Cardiovascular Disease | Admitting: Cardiovascular Disease

## 2013-04-02 DIAGNOSIS — I6529 Occlusion and stenosis of unspecified carotid artery: Secondary | ICD-10-CM | POA: Insufficient documentation

## 2013-04-02 NOTE — Progress Notes (Signed)
Carotid Duplex Completed. °Brianna L Mazza,RVT °

## 2013-04-04 ENCOUNTER — Encounter: Payer: Self-pay | Admitting: *Deleted

## 2013-04-04 ENCOUNTER — Telehealth: Payer: Self-pay | Admitting: *Deleted

## 2013-04-04 DIAGNOSIS — I6529 Occlusion and stenosis of unspecified carotid artery: Secondary | ICD-10-CM

## 2013-04-04 NOTE — Telephone Encounter (Signed)
Message copied by Chauncy Lean on Thu Apr 04, 2013 11:26 AM ------      Message from: Lorretta Harp      Created: Thu Apr 04, 2013  6:16 AM       No change from prior study. Repeat in 6 months ------

## 2013-05-27 ENCOUNTER — Other Ambulatory Visit: Payer: Self-pay | Admitting: Cardiovascular Disease

## 2013-05-28 ENCOUNTER — Telehealth: Payer: Self-pay | Admitting: Cardiovascular Disease

## 2013-05-28 MED ORDER — METOPROLOL TARTRATE 25 MG PO TABS: 12.5000 mg | ORAL_TABLET | Freq: Two times a day (BID) | ORAL | Status: DC

## 2013-05-28 NOTE — Telephone Encounter (Signed)
Returned call.  Pt informed message received.  Pt also informed he has an appt on Friday w/ Dr. Gwenlyn Found and asked if he has enough meds to last until appt.  Pt stated he is out of one and almost out of the other.  Pt informed 2 week supply will be sent to pharmacy to last until mail order received.  Pt also informed will defer mail order refills until his appt on Friday as Dr. Gwenlyn Found may make changes.  Pt verbalized understanding and agreed w/ plan.  Message forwarded to K. Donne Hazel, Therapist, sports.

## 2013-05-28 NOTE — Telephone Encounter (Signed)
Mr Wandell wants his pacerone and lopressor med rxs called to [facility] so he can get cheaper cost rather than [facility]  .  If not home leave voice mail

## 2013-05-29 ENCOUNTER — Encounter: Payer: Self-pay | Admitting: Physician Assistant

## 2013-05-29 DIAGNOSIS — I6523 Occlusion and stenosis of bilateral carotid arteries: Secondary | ICD-10-CM

## 2013-05-29 DIAGNOSIS — Z951 Presence of aortocoronary bypass graft: Secondary | ICD-10-CM

## 2013-05-29 DIAGNOSIS — I6529 Occlusion and stenosis of unspecified carotid artery: Secondary | ICD-10-CM | POA: Insufficient documentation

## 2013-05-31 ENCOUNTER — Ambulatory Visit (INDEPENDENT_AMBULATORY_CARE_PROVIDER_SITE_OTHER): Payer: PRIVATE HEALTH INSURANCE | Admitting: Cardiovascular Disease

## 2013-05-31 ENCOUNTER — Encounter: Payer: Self-pay | Admitting: Cardiovascular Disease

## 2013-05-31 VITALS — BP 102/50 | HR 53 | Ht 65.5 in | Wt 153.0 lb

## 2013-05-31 DIAGNOSIS — I2510 Atherosclerotic heart disease of native coronary artery without angina pectoris: Secondary | ICD-10-CM

## 2013-05-31 DIAGNOSIS — I480 Paroxysmal atrial fibrillation: Secondary | ICD-10-CM

## 2013-05-31 DIAGNOSIS — I6523 Occlusion and stenosis of bilateral carotid arteries: Secondary | ICD-10-CM

## 2013-05-31 DIAGNOSIS — I350 Nonrheumatic aortic (valve) stenosis: Secondary | ICD-10-CM

## 2013-05-31 DIAGNOSIS — I359 Nonrheumatic aortic valve disorder, unspecified: Secondary | ICD-10-CM

## 2013-05-31 DIAGNOSIS — I6529 Occlusion and stenosis of unspecified carotid artery: Secondary | ICD-10-CM

## 2013-05-31 DIAGNOSIS — I658 Occlusion and stenosis of other precerebral arteries: Secondary | ICD-10-CM

## 2013-05-31 DIAGNOSIS — I4891 Unspecified atrial fibrillation: Secondary | ICD-10-CM

## 2013-05-31 MED ORDER — TADALAFIL 10 MG PO TABS: ORAL_TABLET | ORAL | Status: DC

## 2013-05-31 NOTE — Assessment & Plan Note (Signed)
Moderate to moderately severe bilateral ICA stenosis by duplex ultrasound recently checked 04/02/13. He is on aspirin. He is neurologically asymptomatic.

## 2013-05-31 NOTE — Assessment & Plan Note (Signed)
Status post [employer] model 3300TFX bioprosthesis performed by Dr. Servando Snare 06/14/12. Followup 2-D echo was normal done in December of last year

## 2013-05-31 NOTE — Progress Notes (Signed)
05/31/2013 Collin Pearson   [birth year]  [id number]  Primary Physician Helen Hashimoto., MD Primary Cardiologist: Lorretta Harp MD [name]   HPI:  The patient is a delightful 79 year old thin appearing married Caucasian male who I last saw approximately 6 months ago. He has a history of CAD status post stenting of his LAD, RCA, and OM branches in [place], [place]. He has had bilateral carotid endarterectomies at [facility] in [place] and bilateral carotid stenting in [place], [place]. His other problems include hypertension, hyperlipidemia, diabetes and chronic renal insufficiency. He had critical aortic stenosis with chest pain and dyspnea. Because of worsening carotid Dopplers I angiogrammed him May 18, 2012 revealing moderate in-stent restenosis within the right carotid stent. He ultimately underwent aortic valve replacement with an [place] TFX bioprosthesis as well as SVG to RCA by Dr. Ceasar Mons June 14, 2012. His followup 2D echo was normal. Tomorrow is his last day of cardiac rehab and he has recuperated nicely. His creatinines have remained stable. His other problems include hypertension, hyperlipidemia, and non-insulin-requiring diabetes. His most recent carotid Doppler performed in February revealed stable velocities on the right correlating to 50-60% in-stent restenosis. Since I saw him 6 months ago he he remains completely stable. He denies chest pain or shortness of breath.     Current Outpatient Prescriptions  Medication Sig Dispense Refill  . amiodarone (PACERONE) 200 MG tablet Take 1 tablet (200 mg total) by mouth daily.  14 tablet  0  . aspirin EC 325 MG EC tablet Take 1 tablet (325 mg total) by mouth daily.  30 tablet    . atorvastatin (LIPITOR) 40 MG tablet Take 40 mg by mouth at bedtime.      . clotrimazole-betamethasone (LOTRISONE) cream Apply 1 application topically as needed. To affected area   30 g  1  . ferrous fumarate (HEMOCYTE - 106 MG FE) 325 (106 FE) MG TABS Take 65 mg of iron by mouth 2 (two) times daily.      . fish oil-omega-3 fatty acids 1000 MG capsule Take 1 g by mouth 3 (three) times daily.      . furosemide (LASIX) 80 MG tablet Take 80 mg by mouth 2 (two) times daily.       Marland Kitchen glipiZIDE (GLUCOTROL) 10 MG tablet Take 5-10 mg by mouth 2 (two) times daily before a meal. Takes 10 mg in the morning and 5 mg in the evening      . hydrALAZINE (APRESOLINE) 25 MG tablet Take 25 mg by mouth 3 (three) times daily.      Marland Kitchen LEVEMIR 100 UNIT/ML injection Inject 20 Units into the skin at bedtime.      . metoprolol tartrate (LOPRESSOR) 25 MG tablet Take 0.5 tablets (12.5 mg total) by mouth 2 (two) times daily.  14 tablet  0  . Multiple Vitamin (MULTIVITAMIN WITH MINERALS) TABS Take 1 tablet by mouth daily.      Marland Kitchen OVER THE COUNTER MEDICATION Take 0.25 tablets by mouth at bedtime. Legatrin for leg cramps      . pantoprazole (PROTONIX) 40 MG tablet Take 40 mg by mouth daily.      . silver sulfADIAZINE (SILVADENE) 1 % cream as needed.      . solifenacin (VESICARE) 5 MG tablet Take 5 mg by mouth daily.       . Tamsulosin HCl (FLOMAX) 0.4 MG CAPS Take 0.4 mg by mouth. One tablet in the morning and 1/2 tablet in  the evening      . tadalafil (CIALIS) 10 MG tablet 1/2-1 tablet as needed  10 tablet  2   No current facility-administered medications for this visit.    No Known Allergies  History   Social History  . Marital Status: Married    Spouse Name: N/A    Number of Children: N/A  . Years of Education: N/A   Occupational History  . Not on file.   Social History Main Topics  . Smoking status: Never Smoker   . Smokeless tobacco: Never Used  . Alcohol Use: No  . Drug Use: No  . Sexual Activity: Not Currently   Other Topics Concern  . Not on file   Social History Narrative  . No narrative on file     Review of Systems: General: negative for chills, fever, night sweats or  weight changes.  Cardiovascular: negative for chest pain, dyspnea on exertion, edema, orthopnea, palpitations, paroxysmal nocturnal dyspnea or shortness of breath Dermatological: negative for rash Respiratory: negative for cough or wheezing Urologic: negative for hematuria Abdominal: negative for nausea, vomiting, diarrhea, bright red blood per rectum, melena, or hematemesis Neurologic: negative for visual changes, syncope, or dizziness All other systems reviewed and are otherwise negative except as noted above.    Blood pressure 102/50, pulse 53, height 5' 5.5" (1.664 m), weight 153 lb (69.4 kg).  General appearance: alert and no distress Neck: no adenopathy, no JVD, supple, symmetrical, trachea midline, thyroid not enlarged, symmetric, no tenderness/mass/nodules and bbilateral carotid bruits Lungs: clear to auscultation bilaterally Heart: regular rate and rhythm, S1, S2 normal, no murmur, click, rub or gallop Extremities: extremities normal, atraumatic, no cyanosis or edema  EKG sinus bradycardia at 53 with right bundle-branch block and low limb voltage  ASSESSMENT AND PLAN:   Carotid artery stenosis,Last dopplers 09/20/12 Moderate to moderately severe bilateral ICA stenosis by duplex ultrasound recently checked 04/02/13. He is on aspirin. He is neurologically asymptomatic.  PAF (paroxysmal atrial fibrillation), s/p cabg Maintaining sinus rhythm on amiodarone  CAD, patent stent to LAD and mid RCA with occluded OM2 branch   Status post stenting of his LAD, circumflex obtuse marginal branch and RCA. He was 05/18/12 revealing an occluded OM 2 stent was patent RCA and LAD stents. He underwent coronary artery bypass grafting x1 at the time of his aortic valve replacement.  AS (aortic stenosis), critical stenosis Status post [employer] model 3300TFX bioprosthesis performed by Dr. Servando Snare 06/14/12. Followup 2-D echo was normal done in December of last year      Lorretta Harp MD [facility], [facility] 05/31/2013 5:05 PM

## 2013-05-31 NOTE — Patient Instructions (Signed)
  Your physician wants you to follow-up with him in : 1 year with Dr Gwenlyn Found                                            and with an extender in : 6 months                    You will receive a reminder letter in the mail one month in advance. If you don't receive a letter, please call our office to schedule the follow-up appointment.    Your physician has recommended you make the following change in your medication: I have sent the prescription for Cialis to the pharmacy.  You can take 1/2- 1 tablet as needed.   Your physician has ordered the following tests: echocardiogram and carotid dopplers in February, 2015

## 2013-05-31 NOTE — Assessment & Plan Note (Signed)
Maintaining sinus rhythm on amiodarone

## 2013-05-31 NOTE — Assessment & Plan Note (Signed)
Status post stenting of his LAD, circumflex obtuse marginal branch and RCA. He was 05/18/12 revealing an occluded OM 2 stent was patent RCA and LAD stents. He underwent coronary artery bypass grafting x1 at the time of his aortic valve replacement.

## 2013-06-03 ENCOUNTER — Encounter: Payer: Self-pay | Admitting: Cardiovascular Disease

## 2013-06-26 ENCOUNTER — Telehealth: Payer: Self-pay | Admitting: Cardiovascular Disease

## 2013-06-26 MED ORDER — AMIODARONE HCL 200 MG PO TABS: 200.0000 mg | ORAL_TABLET | Freq: Every day | ORAL | Status: DC

## 2013-06-26 MED ORDER — METOPROLOL TARTRATE 25 MG PO TABS: 12.5000 mg | ORAL_TABLET | Freq: Two times a day (BID) | ORAL | Status: DC

## 2013-06-26 NOTE — Telephone Encounter (Signed)
Please call-wants to know if he needs to continue taking  Pacerone and Lopressor?

## 2013-06-26 NOTE — Telephone Encounter (Signed)
Returned call and pt verified x 2.  Pt needs refills on Pacerone an Lopressor.  Stated no refills left.  Informed refills will be sent and of refill process.  Pt verbalized understanding and agreed w/ plan.  Refill(s) sent to pharmacy for 90-day supply for both meds.

## 2013-09-01 ENCOUNTER — Other Ambulatory Visit: Payer: Self-pay | Admitting: Cardiovascular Disease

## 2013-09-02 NOTE — Telephone Encounter (Signed)
Rx was sent to pharmacy electronically. 

## 2013-10-07 ENCOUNTER — Encounter (HOSPITAL_COMMUNITY): Payer: PRIVATE HEALTH INSURANCE

## 2013-10-10 ENCOUNTER — Encounter (HOSPITAL_COMMUNITY): Payer: PRIVATE HEALTH INSURANCE

## 2013-10-16 ENCOUNTER — Ambulatory Visit (HOSPITAL_COMMUNITY)
Admission: RE | Admit: 2013-10-16 | Discharge: 2013-10-16 | Disposition: A | Discharge status: Home / Self Care | Payer: PRIVATE HEALTH INSURANCE | Source: Ambulatory Visit | Attending: Cardiovascular Disease | Admitting: Cardiovascular Disease

## 2013-10-16 DIAGNOSIS — I6529 Occlusion and stenosis of unspecified carotid artery: Secondary | ICD-10-CM

## 2013-10-16 NOTE — Progress Notes (Signed)
Carotid Duplex Completed. °Brianna L Mazza,RVT °

## 2013-10-17 ENCOUNTER — Encounter (HOSPITAL_COMMUNITY): Payer: PRIVATE HEALTH INSURANCE

## 2013-10-20 ENCOUNTER — Encounter: Payer: Self-pay | Admitting: *Deleted

## 2013-10-20 ENCOUNTER — Telehealth: Payer: Self-pay | Admitting: *Deleted

## 2013-10-20 DIAGNOSIS — I6529 Occlusion and stenosis of unspecified carotid artery: Secondary | ICD-10-CM

## 2013-10-20 NOTE — Telephone Encounter (Signed)
Order placed for repeat carotid dopplers in 6 months  

## 2013-10-20 NOTE — Telephone Encounter (Signed)
Message copied by Chauncy Lean on Sun Oct 20, 2013  9:52 PM ------      Message from: Lorretta Harp      Created: Sun Oct 20, 2013  5:50 PM       No change from prior study. Repeat in 6 months ------

## 2013-12-03 ENCOUNTER — Other Ambulatory Visit: Payer: Self-pay

## 2013-12-03 MED ORDER — METOPROLOL TARTRATE 25 MG PO TABS: 12.5000 mg | ORAL_TABLET | Freq: Two times a day (BID) | ORAL | Status: DC

## 2013-12-03 MED ORDER — TADALAFIL 10 MG PO TABS: ORAL_TABLET | ORAL | Status: DC

## 2013-12-03 NOTE — Telephone Encounter (Signed)
Rx was sent to pharmacy electronically. 

## 2014-01-08 ENCOUNTER — Ambulatory Visit (HOSPITAL_COMMUNITY)
Admission: RE | Admit: 2014-01-08 | Discharge: 2014-01-08 | Disposition: A | Discharge status: Home / Self Care | Payer: PRIVATE HEALTH INSURANCE | Source: Ambulatory Visit | Attending: Internal Medicine | Admitting: Internal Medicine

## 2014-01-08 DIAGNOSIS — I359 Nonrheumatic aortic valve disorder, unspecified: Secondary | ICD-10-CM | POA: Insufficient documentation

## 2014-01-08 DIAGNOSIS — I517 Cardiomegaly: Secondary | ICD-10-CM

## 2014-01-08 DIAGNOSIS — I350 Nonrheumatic aortic (valve) stenosis: Secondary | ICD-10-CM

## 2014-01-08 NOTE — Progress Notes (Signed)
2D Echocardiogram Complete.  01/08/2014   Miner Koral, RDCS

## 2014-01-10 ENCOUNTER — Telehealth: Payer: Self-pay | Admitting: *Deleted

## 2014-01-10 NOTE — Telephone Encounter (Signed)
Left message to call back.  Results will be given.

## 2014-01-10 NOTE — Telephone Encounter (Signed)
Message copied by Raiford Simmonds on Fri Jan 10, 2014 12:49 PM ------      Message from: Lorretta Harp      Created: Wed Jan 08, 2014  4:35 PM       Nl LV fxn with well functioning Ao bioprosthesis ------

## 2014-01-15 NOTE — Telephone Encounter (Signed)
LEFT MESSAGE TO CALL BACK

## 2014-01-16 NOTE — Telephone Encounter (Signed)
Returning your call,pt been out of town.

## 2014-01-16 NOTE — Telephone Encounter (Signed)
Pt. Informed of echo results

## 2014-03-18 ENCOUNTER — Telehealth: Payer: Self-pay | Admitting: Cardiovascular Disease

## 2014-03-20 NOTE — Telephone Encounter (Signed)
Closed encounter °

## 2014-04-23 ENCOUNTER — Encounter (HOSPITAL_COMMUNITY): Payer: PRIVATE HEALTH INSURANCE

## 2014-04-24 ENCOUNTER — Ambulatory Visit (HOSPITAL_COMMUNITY)
Admission: RE | Admit: 2014-04-24 | Discharge: 2014-04-24 | Disposition: A | Discharge status: Home / Self Care | Payer: PRIVATE HEALTH INSURANCE | Source: Ambulatory Visit | Attending: Cardiovascular Disease | Admitting: Cardiovascular Disease

## 2014-04-24 DIAGNOSIS — I6523 Occlusion and stenosis of bilateral carotid arteries: Secondary | ICD-10-CM

## 2014-04-24 DIAGNOSIS — I6529 Occlusion and stenosis of unspecified carotid artery: Secondary | ICD-10-CM | POA: Insufficient documentation

## 2014-04-24 NOTE — Progress Notes (Signed)
Carotid Duplex Completed. °Brianna L Mazza,RVT °

## 2014-05-07 ENCOUNTER — Other Ambulatory Visit: Payer: Self-pay | Admitting: Cardiovascular Disease

## 2014-05-07 NOTE — Telephone Encounter (Signed)
Rx was sent to pharmacy electronically. Patient has OV 9/29

## 2014-05-09 ENCOUNTER — Other Ambulatory Visit: Payer: Self-pay | Admitting: Cardiovascular Disease

## 2014-05-09 NOTE — Telephone Encounter (Signed)
E sentrx 

## 2014-05-13 ENCOUNTER — Ambulatory Visit (INDEPENDENT_AMBULATORY_CARE_PROVIDER_SITE_OTHER): Payer: PRIVATE HEALTH INSURANCE | Admitting: Cardiovascular Disease

## 2014-05-13 ENCOUNTER — Encounter: Payer: Self-pay | Admitting: Cardiovascular Disease

## 2014-05-13 VITALS — BP 144/60 | HR 50 | Ht 65.5 in | Wt 148.0 lb

## 2014-05-13 DIAGNOSIS — I480 Paroxysmal atrial fibrillation: Secondary | ICD-10-CM

## 2014-05-13 DIAGNOSIS — I359 Nonrheumatic aortic valve disorder, unspecified: Secondary | ICD-10-CM

## 2014-05-13 DIAGNOSIS — I739 Peripheral vascular disease, unspecified: Secondary | ICD-10-CM

## 2014-05-13 DIAGNOSIS — E785 Hyperlipidemia, unspecified: Secondary | ICD-10-CM

## 2014-05-13 DIAGNOSIS — I10 Essential (primary) hypertension: Secondary | ICD-10-CM

## 2014-05-13 DIAGNOSIS — I2510 Atherosclerotic heart disease of native coronary artery without angina pectoris: Secondary | ICD-10-CM

## 2014-05-13 DIAGNOSIS — I350 Nonrheumatic aortic (valve) stenosis: Secondary | ICD-10-CM

## 2014-05-13 DIAGNOSIS — I4891 Unspecified atrial fibrillation: Secondary | ICD-10-CM

## 2014-05-13 MED ORDER — METOPROLOL TARTRATE 25 MG PO TABS: ORAL_TABLET | ORAL | Status: DC

## 2014-05-13 NOTE — Assessment & Plan Note (Signed)
History of CAD status post stenting of his LAD, RCA and OM branches in length per [place]. He had coronary artery bypass grafting x1 with a vein graft to his RCA by Dr. Servando Snare 06/14/12. At the same time he had a aortic bioprosthesis placed because of aortic stenosis.

## 2014-05-13 NOTE — Progress Notes (Signed)
05/13/2014 Collin Pearson   1927/03/18  [id number]  Primary Physician Helen Hashimoto., MD Primary Cardiologist: Lorretta Harp MD [name]   HPI: The patient is a delightful 80 year old thin appearing married Caucasian male who I last saw approximately 46months ago. He has a history of CAD status post stenting of his LAD, RCA, and OM branches in [place], [place]. He has had bilateral carotid endarterectomies at [facility] in [place] and bilateral carotid stenting in [place], [place]. His other problems include hypertension, hyperlipidemia, diabetes and chronic renal insufficiency. He had critical aortic stenosis with chest pain and dyspnea. Because of worsening carotid Dopplers I angiogrammed him May 18, 2012 revealing moderate in-stent restenosis within the right carotid stent. He ultimately underwent aortic valve replacement with an [place] TFX bioprosthesis as well as SVG to RCA by Dr. Ceasar Mons June 14, 2012. His followup 2D echo was normal. Tomorrow is his last day of cardiac rehab and he has recuperated nicely. His creatinines have remained stable. His other problems include hypertension, hyperlipidemia, and non-insulin-requiring diabetes. His most recent carotid Doppler performed in February revealed stable velocities on the right correlating to 50-60% in-stent restenosis. Since I saw him 12 months ago he he remains completely stable. He denies chest pain or shortness of breath.he had a recent 2-D echo showed normal systolic function with a well functioning aortic bioprosthesis. Recent carotid Doppler showed progression of disease in his right carotid stent probably in the 80-90% range. He is on aspirin Plavix and remains neurologically asymptomatic. His major complaint is of pain in his right second toe with a nonhealing ulcer probably related to PAD. His serum creatinines are elevated making intervention somewhat  problematic.     Current Outpatient Prescriptions  Medication Sig Dispense Refill  . acetaminophen (TYLENOL) 500 MG tablet Take 500 mg by mouth every 8 (eight) hours as needed for moderate pain (foot pain).      Marland Kitchen amiodarone (PACERONE) 200 MG tablet Take 1 tablet (200 mg total) by mouth daily.  90 tablet  3  . atorvastatin (LIPITOR) 40 MG tablet TAKE 1 TABLET DAILY AT BEDTIME  90 tablet  0  . Clopidogrel Bisulfate (PLAVIX PO) Take 1 tablet by mouth daily.      . clotrimazole-betamethasone (LOTRISONE) cream Apply 1 application topically as needed. To affected area  30 g  1  . docusate sodium (STOOL SOFTENER) 100 MG capsule Take 100 mg by mouth 3 (three) times a week.      Marland Kitchen Epoetin Alfa (PROCRIT IJ) Inject as directed.      . fish oil-omega-3 fatty acids 1000 MG capsule Take 1 g by mouth 3 (three) times daily.      . furosemide (LASIX) 80 MG tablet Take 80 mg by mouth 2 (two) times daily.       Marland Kitchen glipiZIDE (GLUCOTROL) 10 MG tablet Take 5-10 mg by mouth 2 (two) times daily before a meal. Takes 10 mg in the morning and 5 mg in the evening      . hydrALAZINE (APRESOLINE) 50 MG tablet Take 50 mg by mouth 3 (three) times daily.      . Iron TABS Take 160 mg by mouth daily.      Marland Kitchen LEVEMIR 100 UNIT/ML injection Inject 20 Units into the skin at bedtime.      . metoprolol tartrate (LOPRESSOR) 25 MG tablet TAKE ONE-HALF (1/2) TABLET (12.5 MG TOTAL) TWICE A DAY  90 tablet  3  . Multiple  Vitamin (MULTIVITAMIN WITH MINERALS) TABS Take 1 tablet by mouth daily.      Marland Kitchen OVER THE COUNTER MEDICATION Take 0.25 tablets by mouth at bedtime. Legatrin for leg cramps      . pantoprazole (PROTONIX) 40 MG tablet Take 40 mg by mouth daily.      . silver sulfADIAZINE (SILVADENE) 1 % cream as needed.      . sodium bicarbonate 650 MG tablet Take 650 mg by mouth 2 (two) times daily.      . solifenacin (VESICARE) 5 MG tablet Take 5 mg by mouth daily.       . Tamsulosin HCl (FLOMAX) 0.4 MG CAPS Take 0.4 mg by mouth. One  tablet in the morning and 1/2 tablet in the evening      . torsemide (DEMADEX) 10 MG tablet Take 10 mg by mouth 2 (two) times daily.       No current facility-administered medications for this visit.    No Known Allergies  History   Social History  . Marital Status: Married    Spouse Name: N/A    Number of Children: N/A  . Years of Education: N/A   Occupational History  . Not on file.   Social History Main Topics  . Smoking status: Never Smoker   . Smokeless tobacco: Never Used  . Alcohol Use: No  . Drug Use: No  . Sexual Activity: Not Currently   Other Topics Concern  . Not on file   Social History Narrative  . No narrative on file     Review of Systems: General: negative for chills, fever, night sweats or weight changes.  Cardiovascular: negative for chest pain, dyspnea on exertion, edema, orthopnea, palpitations, paroxysmal nocturnal dyspnea or shortness of breath Dermatological: negative for rash Respiratory: negative for cough or wheezing Urologic: negative for hematuria Abdominal: negative for nausea, vomiting, diarrhea, bright red blood per rectum, melena, or hematemesis Neurologic: negative for visual changes, syncope, or dizziness All other systems reviewed and are otherwise negative except as noted above.    Blood pressure 144/60, pulse 50, height 5' 5.5" (1.664 m), weight 148 lb (67.132 kg).  General appearance: alert and no distress Neck: no adenopathy, no JVD, supple, symmetrical, trachea midline, thyroid not enlarged, symmetric, no tenderness/mass/nodules and bilateral carotid bruits Lungs: clear to auscultation bilaterally Heart: soft outflow tract murmur Extremities: extremities normal, atraumatic, no cyanosis or edema  EKG sinus bradycardia at 50 with rectal branch block and left anterior fascicular block as well as inferior Q waves  ASSESSMENT AND PLAN:   PVD, with hx of bilateral CEA at [facility], and previous bilateral carotid stenting, with  high grade ISR of Rt. carotid 05/18/12 History of bilateral carotid endarterectomies remotely as well as bilateral carotid stenting in [facility]. I angiogram his right carotid in October of 2013 showing 70-80% proximal in-stent restenosis. We will follow this by duplex ultrasound. His most recent Doppler suggest progression disease. He is on aspirin Plavix and was neurologically asymptomatic.I am hesitant to recommend intervention at this time.  HTN (hypertension) Controlled on current medications  Dyslipidemia On statin therapy, followed by his PCP  CAD, patent stent to LAD and mid RCA with occluded OM2 branch   History of CAD status post stenting of his LAD, RCA and OM branches in length per [place]. He had coronary artery bypass grafting x1 with a vein graft to his RCA by Dr. Servando Snare 06/14/12. At the same time he had a aortic bioprosthesis placed because of aortic stenosis.  AS (aortic stenosis), critical stenosis History of aortic valve replacement by Dr. Servando Snare patient 06/14/12 closely 1/13 with a [employer] model 3300 TFX bioprosthesis.recent 2-D echocardiogram revealed normal LV systolic function with a well functioning aortic bioprosthesis  PAF (paroxysmal atrial fibrillation), s/p cabg Maintaining sinus rhythm on amiodarone      Lorretta Harp MD [facility] 05/13/2014 5:24 PM

## 2014-05-13 NOTE — Assessment & Plan Note (Signed)
History of aortic valve replacement by Dr. Servando Snare patient 06/14/12 closely 1/13 with a [employer] model 3300 TFX bioprosthesis.recent 2-D echocardiogram revealed normal LV systolic function with a well functioning aortic bioprosthesis

## 2014-05-13 NOTE — Assessment & Plan Note (Signed)
Maintaining sinus rhythm on amiodarone

## 2014-05-13 NOTE — Assessment & Plan Note (Signed)
On statin therapy, followed by his PCP

## 2014-05-13 NOTE — Assessment & Plan Note (Signed)
Controlled on current medications 

## 2014-05-13 NOTE — Assessment & Plan Note (Signed)
History of bilateral carotid endarterectomies remotely as well as bilateral carotid stenting in [facility]. I angiogram his right carotid in October of 2013 showing 70-80% proximal in-stent restenosis. We will follow this by duplex ultrasound. His most recent Doppler suggest progression disease. He is on aspirin Plavix and was neurologically asymptomatic.I am hesitant to recommend intervention at this time.

## 2014-05-13 NOTE — Patient Instructions (Signed)
Your physician wants you to follow-up in: 6 months with Dr Berry. You will receive a reminder letter in the mail two months in advance. If you don't receive a letter, please call our office to schedule the follow-up appointment.  

## 2014-05-19 ENCOUNTER — Telehealth: Payer: Self-pay | Admitting: Cardiovascular Disease

## 2014-05-19 NOTE — Telephone Encounter (Signed)
Collin Pearson was calling to speak with Dr. Gwenlyn Found to see how the pt's wound care has been going. Please call  Thanks

## 2014-05-19 NOTE — Telephone Encounter (Signed)
[facility]  Wound on toe is showing progess, no osteo per [place].  I explained that our hands are tied due to CRI.  Patient will continue to follow up at wound care center.

## 2014-05-19 NOTE — Telephone Encounter (Signed)
Deferred to Dr. Gwenlyn Found and Curt Bears

## 2014-05-20 ENCOUNTER — Telehealth: Payer: Self-pay | Admitting: Cardiovascular Disease

## 2014-05-20 NOTE — Telephone Encounter (Signed)
Pt call again,please call.

## 2014-05-20 NOTE — Telephone Encounter (Signed)
Spoke with patient. He has diabetic ulcer on right toe and was told he has poor circulation on that leg. He states Dr. Gwenlyn Found told him he could angiogram him and fix this and patient wanted to talk with his wife first. Patient would like to proceed with angiogram (if that is the plan per Dr. Gwenlyn Found).   Will defer to Curt Bears, RN and Dr. Gwenlyn Found to advise

## 2014-05-20 NOTE — Telephone Encounter (Signed)
Pt called wanting to speak to Dr.Berry about removing to ulcer on his toe. Please call  Thanks

## 2014-05-20 NOTE — Telephone Encounter (Signed)
LMTCB to obtain more information to communicate to MD

## 2014-05-21 ENCOUNTER — Other Ambulatory Visit: Payer: Self-pay | Admitting: *Deleted

## 2014-05-21 ENCOUNTER — Telehealth: Payer: Self-pay | Admitting: Cardiovascular Disease

## 2014-05-21 DIAGNOSIS — I739 Peripheral vascular disease, unspecified: Secondary | ICD-10-CM

## 2014-05-21 DIAGNOSIS — L97509 Non-pressure chronic ulcer of other part of unspecified foot with unspecified severity: Secondary | ICD-10-CM

## 2014-05-21 NOTE — Telephone Encounter (Signed)
Brita Romp RN contacted patient.

## 2014-05-21 NOTE — Telephone Encounter (Signed)
Pt called again today,waiting to see what Dr Gwenlyn Found said.If you do not get him today,please call him first thing tomorrow morning.

## 2014-05-21 NOTE — Telephone Encounter (Signed)
Clarification of Dr Kennon Holter comment-plan is not to perform the angiogram due to renal insufficiency.  I called the patient and explained that Dr Gwenlyn Found doesn't feel comfortable proceeding with an angiogram. He said that Dr Gwenlyn Found mentioned that he could do it without dye.    I asked Dr Gwenlyn Found to speak with Mr Leinen.  He and Mr Julien Girt spoke. They came to the conclusion that we will get lower extremity dopplers and then decide.  Patient verbalized understanding.

## 2014-05-21 NOTE — Telephone Encounter (Signed)
The plan is to perform angiography because of chronic renal insufficiency. We should get lower extremity arterial Doppler studies however.

## 2014-05-26 ENCOUNTER — Encounter (HOSPITAL_COMMUNITY): Payer: Self-pay | Admitting: *Deleted

## 2014-06-04 ENCOUNTER — Ambulatory Visit (HOSPITAL_COMMUNITY)
Admission: RE | Admit: 2014-06-04 | Discharge: 2014-06-04 | Disposition: A | Discharge status: Home / Self Care | Payer: PRIVATE HEALTH INSURANCE | Source: Ambulatory Visit | Attending: Cardiovascular Disease | Admitting: Cardiovascular Disease

## 2014-06-04 DIAGNOSIS — E119 Type 2 diabetes mellitus without complications: Secondary | ICD-10-CM | POA: Insufficient documentation

## 2014-06-04 DIAGNOSIS — I10 Essential (primary) hypertension: Secondary | ICD-10-CM | POA: Insufficient documentation

## 2014-06-04 DIAGNOSIS — Z951 Presence of aortocoronary bypass graft: Secondary | ICD-10-CM | POA: Diagnosis not present

## 2014-06-04 DIAGNOSIS — L97509 Non-pressure chronic ulcer of other part of unspecified foot with unspecified severity: Secondary | ICD-10-CM | POA: Insufficient documentation

## 2014-06-04 DIAGNOSIS — E785 Hyperlipidemia, unspecified: Secondary | ICD-10-CM | POA: Insufficient documentation

## 2014-06-04 DIAGNOSIS — I739 Peripheral vascular disease, unspecified: Secondary | ICD-10-CM | POA: Insufficient documentation

## 2014-06-04 NOTE — Progress Notes (Signed)
Lower Extremity Arterial Duplex Completed. °Brianna L Mazza,RVT °

## 2014-06-09 ENCOUNTER — Encounter (HOSPITAL_COMMUNITY): Payer: PRIVATE HEALTH INSURANCE

## 2014-06-23 ENCOUNTER — Telehealth: Payer: Self-pay | Admitting: Cardiovascular Disease

## 2014-06-23 NOTE — Telephone Encounter (Signed)
Returned call to patient he wanted 06/04/14 lower ext doppler results.Stated he is having pain in right foot.Stated he has a diabetic ulcer on right 2nd toe.Stated he has numbness in left foot, no pain.Message sent to Dr.Berry's nurse Brita Romp RN.

## 2014-06-23 NOTE — Telephone Encounter (Signed)
Pt was calling to get his results from doppler done on 10/21. Please call  Thanks

## 2014-06-23 NOTE — Telephone Encounter (Signed)
Calling to get his test results.. Please call    Thanks

## 2014-06-30 NOTE — Telephone Encounter (Signed)
Spoke with patient and gave doppler results 

## 2014-07-22 ENCOUNTER — Telehealth: Payer: Self-pay | Admitting: Cardiovascular Disease

## 2014-07-22 MED ORDER — METOPROLOL TARTRATE 25 MG PO TABS: ORAL_TABLET | ORAL | Status: DC

## 2014-07-22 MED ORDER — AMIODARONE HCL 200 MG PO TABS: 200.0000 mg | ORAL_TABLET | Freq: Every day | ORAL | Status: DC

## 2014-07-22 MED ORDER — AMLODIPINE BESYLATE 5 MG PO TABS: 5.0000 mg | ORAL_TABLET | Freq: Every day | ORAL | Status: DC

## 2014-07-22 NOTE — Telephone Encounter (Signed)
Pt called to report BPs systolic ranging from 99991111 over past several days. Pt stated lowest recent BP after taking morning medications was 157/62. Has been having VS checked at wound center in [place] (being seen recently for diabetic foot ulcer), clinician there recommended he F/U with Dr. Gwenlyn Found regarding BP issues. Pt reports "head feels like it's got water in it" and hears his pulse in his ear. Verified medications w/ patient and he reports he is taking his medications as indicated. Pt also called to request refill for lopressor and pacerone.

## 2014-07-22 NOTE — Telephone Encounter (Signed)
Communicated to pt on Dr. Lysbeth Penner instruction for new medication, to call for any new or worsening symptoms, and to continue to check BP daily and pulse if possible. Pt voices understanding.

## 2014-07-22 NOTE — Telephone Encounter (Signed)
Start amlodipine 5 mg daily. Follow-up with Dr. Gwenlyn Found.

## 2014-07-22 NOTE — Telephone Encounter (Signed)
Collin Pearson is calling because sometimes his blood pressure goes up high in the 200 reange and would like to speak to someone . Please call    Thanks

## 2014-07-22 NOTE — Telephone Encounter (Signed)
Pt. Called no answer, phone just rang and rang and no answering machine came on

## 2014-07-23 ENCOUNTER — Ambulatory Visit (INDEPENDENT_AMBULATORY_CARE_PROVIDER_SITE_OTHER): Payer: PRIVATE HEALTH INSURANCE | Admitting: Pharmacist Clinician (PhC)/ Clinical Pharmacy Specialist

## 2014-07-23 VITALS — BP 148/70 | HR 52 | Ht 65.5 in | Wt 148.3 lb

## 2014-07-23 DIAGNOSIS — I10 Essential (primary) hypertension: Secondary | ICD-10-CM

## 2014-07-23 NOTE — Patient Instructions (Signed)
Return for a a follow up appointment with Dr. Gwenlyn Found on January 8  Your blood pressure today is 148/70  (goal is <140/90)  Check your blood pressure at home daily and keep record of the readings.  Continue with your current medications, take the amlodipine each morning  Bring all of your meds, your BP cuff and your record of home blood pressures to your next appointment.  Exercise as you're able, try to walk approximately 30 minutes per day.  Keep salt intake to a minimum, especially watch canned and prepared boxed foods.  Eat more fresh fruits and vegetables and fewer canned items.  Avoid eating in fast food restaurants.    HOW TO TAKE YOUR BLOOD PRESSURE: . Rest 5 minutes before taking your blood pressure. .  Don't smoke or drink caffeinated beverages for at least 30 minutes before. . Take your blood pressure before (not after) you eat. . Sit comfortably with your back supported and both feet on the floor (don't cross your legs). . Elevate your arm to heart level on a table or a desk. . Use the proper sized cuff. It should fit smoothly and snugly around your bare upper arm. There should be enough room to slip a fingertip under the cuff. The bottom edge of the cuff should be 1 inch above the crease of the elbow. . Ideally, take 3 measurements at one sitting and record the average.

## 2014-07-24 ENCOUNTER — Encounter (HOSPITAL_COMMUNITY): Payer: Self-pay | Admitting: Cardiovascular Disease

## 2014-07-27 ENCOUNTER — Encounter: Payer: Self-pay | Admitting: Pharmacist Clinician (PhC)/ Clinical Pharmacy Specialist

## 2014-07-27 NOTE — Progress Notes (Signed)
07/27/2014 Collin Pearson 09-25-1926 [id number]   HPI:  Collin Pearson is a 80 y.o. male patient of Dr Gwenlyn Found with a PMH below who presents today for hypertension clinic evaluation.  He has been doing wound therapy with hyperbaric chamber, but has been unable to continue with treatments because of elevated pressures at the wound center.  Per their information his systolic reading has been 184-207 over several days.    Cardiac Hx: stents x 3, CABG x 1 (2013), aortic stenosis with bioprosthetic valve  Family Hx: both parents suffered MI's as did multiple aunts and uncles on both sides of family, as well as maternal grandfather.  Has one brother with CAD, now in his 65s.    Social Hx: no tobacco, alcohol, 1/2-1 cup coffee per day.  Takes no pain meds for diabetic foot ulcer  Diet: light salt when cooking, none at table,  Pt is diabetic, A1c unknown  Exercise:  Not able to walk much due to foot ulcer, previously did regular squats  Home BP readings: at wound center systolic range [id number]  Current antihypertensive medications: hydralazine 25 mg tid, metoprolol tart 25 mg bid, amlodipine 5 mg qd   Current Outpatient Prescriptions  Medication Sig Dispense Refill  . amiodarone (PACERONE) 200 MG tablet Take 1 tablet (200 mg total) by mouth daily. 90 tablet 3  . amLODipine (NORVASC) 5 MG tablet Take 1 tablet (5 mg total) by mouth daily. 180 tablet 3  . aspirin EC 81 MG tablet Take 81 mg by mouth 2 (two) times daily.    Marland Kitchen atorvastatin (LIPITOR) 40 MG tablet TAKE 1 TABLET DAILY AT BEDTIME 90 tablet 0  . Clopidogrel Bisulfate (PLAVIX PO) Take 1 tablet by mouth daily.    . clotrimazole-betamethasone (LOTRISONE) cream Apply 1 application topically as needed. To affected area 30 g 1  . docusate sodium (STOOL SOFTENER) 100 MG capsule Take 100 mg by mouth 3 (three) times a week.    Marland Kitchen Epoetin Alfa (PROCRIT IJ) Inject as directed.    . fish oil-omega-3 fatty acids 1000 MG capsule Take 1 g by  mouth 3 (three) times daily.    . furosemide (LASIX) 80 MG tablet Take 80 mg by mouth 2 (two) times daily.     Marland Kitchen glipiZIDE (GLUCOTROL) 10 MG tablet Take 5-10 mg by mouth 2 (two) times daily before a meal. Takes 10 mg in the morning and 5 mg in the evening    . hydrALAZINE (APRESOLINE) 50 MG tablet Take 50 mg by mouth 3 (three) times daily.    . Iron TABS Take 160 mg by mouth daily.    Marland Kitchen LEVEMIR 100 UNIT/ML injection Inject 20 Units into the skin at bedtime.    . metoprolol tartrate (LOPRESSOR) 25 MG tablet TAKE ONE-HALF (1/2) TABLET (12.5 MG TOTAL) TWICE A DAY 90 tablet 3  . Multiple Vitamin (MULTIVITAMIN WITH MINERALS) TABS Take 1 tablet by mouth daily.    . pantoprazole (PROTONIX) 40 MG tablet Take 40 mg by mouth daily.    . sodium bicarbonate 650 MG tablet Take 650 mg by mouth 2 (two) times daily.    . solifenacin (VESICARE) 5 MG tablet Take 5 mg by mouth daily.     . Tamsulosin HCl (FLOMAX) 0.4 MG CAPS Take 0.4 mg by mouth. One tablet in the morning and 1/2 tablet in the evening    . acetaminophen (TYLENOL) 500 MG tablet Take 500 mg by mouth every 8 (eight) hours as needed for  moderate pain (foot pain).    Marland Kitchen OVER THE COUNTER MEDICATION Take 0.25 tablets by mouth at bedtime. Legatrin for leg cramps    . silver sulfADIAZINE (SILVADENE) 1 % cream as needed.    . torsemide (DEMADEX) 10 MG tablet Take 10 mg by mouth 2 (two) times daily.     No current facility-administered medications for this visit.    No Known Allergies  Past Medical History  Diagnosis Date  . Clotting disorder   . Coronary artery disease   . AS (aortic stenosis)     status post aortic valve replacement with an [employer] model 71 T. fracture bioprosthesis  . End stage renal disease   . Anginal pain   . Hypertension   . Diabetes mellitus   . Peripheral vascular disease   . GERD (gastroesophageal reflux disease)   . CKD (chronic kidney disease) stage 4, GFR 15-29 ml/min   . PVD (peripheral vascular  disease), with hsx of bilateral carotid endarterectomies at [facility], and bilateral carotid stenting 05/18/2012  . Angina effort 05/18/2012  . Shortness of breath   . Heart murmur   . PAF (paroxysmal atrial fibrillation), s/p cabg 06/18/2012  . Critical lower limb ischemia     Blood pressure 148/70, pulse 52, height 5' 5.5" (1.664 m), weight 148 lb 4.8 oz (67.268 kg).    Tommy Medal PharmD CPP [facility]

## 2014-07-27 NOTE — Assessment & Plan Note (Signed)
Collin Pearson' blood pressure is much improved with the addition of amlodipine 5mg  once daily.  Patient reports no adverse effects from medication.  He has only taken 2 doses, so for now I will continue him at the 5mg  daily.  He is to follow up with the wound center.  If his pressure is not acceptable to them after being on the amlodipine 5mg  daily for at least 7-10 days we can consider increasing the dose.  He or the wound center can contact us.  He is already scheduled for an appointment with Dr. Gwenlyn Found in January.  I will see him after than should the need arise.

## 2014-07-28 ENCOUNTER — Ambulatory Visit: Payer: PRIVATE HEALTH INSURANCE | Admitting: Pharmacist Clinician (PhC)/ Clinical Pharmacy Specialist

## 2014-08-04 ENCOUNTER — Other Ambulatory Visit: Payer: Self-pay | Admitting: Cardiovascular Disease

## 2014-08-04 NOTE — Telephone Encounter (Signed)
Rx refill sent to patient pharmacy   

## 2014-08-22 ENCOUNTER — Encounter: Payer: Self-pay | Admitting: Cardiovascular Disease

## 2014-08-22 ENCOUNTER — Ambulatory Visit (INDEPENDENT_AMBULATORY_CARE_PROVIDER_SITE_OTHER): Payer: PRIVATE HEALTH INSURANCE | Admitting: Cardiovascular Disease

## 2014-08-22 VITALS — BP 162/64 | HR 52 | Ht 65.5 in | Wt 146.0 lb

## 2014-08-22 DIAGNOSIS — I10 Essential (primary) hypertension: Secondary | ICD-10-CM

## 2014-08-22 DIAGNOSIS — I480 Paroxysmal atrial fibrillation: Secondary | ICD-10-CM

## 2014-08-22 DIAGNOSIS — I2583 Coronary atherosclerosis due to lipid rich plaque: Secondary | ICD-10-CM

## 2014-08-22 DIAGNOSIS — I2510 Atherosclerotic heart disease of native coronary artery without angina pectoris: Secondary | ICD-10-CM

## 2014-08-22 DIAGNOSIS — Z951 Presence of aortocoronary bypass graft: Secondary | ICD-10-CM

## 2014-08-22 DIAGNOSIS — I739 Peripheral vascular disease, unspecified: Secondary | ICD-10-CM

## 2014-08-22 MED ORDER — AMLODIPINE BESYLATE 10 MG PO TABS: 10.0000 mg | ORAL_TABLET | Freq: Every day | ORAL | Status: DC

## 2014-08-22 NOTE — Patient Instructions (Signed)
Your physician has recommended making the following medication changes: INCREASE Amlodipine to 10 mg. You may take 2 tablets (5 mg) until that bottle runs out, then pick up the new prescription (10 mg) and take 1 tablet daily.  Dr Gwenlyn Found wants you to follow-up in 6 months. You will receive a reminder letter in the mail one months in advance. If you don't receive a letter, please call our office to schedule the follow-up appointment.

## 2014-08-22 NOTE — Assessment & Plan Note (Signed)
History of coronary artery disease status post coronary artery bypass grafting with a vein to the RCA by Dr. Servando Snare 06/14/12. He also had aortic valve replacement simultaneously with an [employer] bioprosthesis. His most recent 2-D echo performed in May of last year revealed normal LV systolic function with a well functioning aortic bioprosthesis.

## 2014-08-22 NOTE — Assessment & Plan Note (Signed)
History of PAF maintaining sinus rhythm on amiodarone

## 2014-08-22 NOTE — Assessment & Plan Note (Signed)
History of hypertension with blood pressure measured today at 162/64. He is on amlodipine, hydralazine, low-dose metoprolol. He is somewhat bradycardic but I'll think he is symptomatic from this. I have looked at his heart rate and blood pressure logs as blood pressure has ranged from the high 40s to the low to mid 60s. I'm going to increase his amlodipine from 5-10 mg a day.

## 2014-08-22 NOTE — Progress Notes (Signed)
08/22/2014 Collin Pearson   [birth year]  [id number]  Primary Physician Helen Hashimoto., MD Primary Cardiologist: Lorretta Harp MD [name]   HPI:  The patient is a delightful 81 year old thin appearing married Caucasian male who I last saw approximately 3 months ago. He has a history of CAD status post stenting of his LAD, RCA, and OM branches in [place], [place]. He has had bilateral carotid endarterectomies at [facility] in [place] and bilateral carotid stenting in [place], [place]. His other problems include hypertension, hyperlipidemia, diabetes and chronic renal insufficiency. He had critical aortic stenosis with chest pain and dyspnea. Because of worsening carotid Dopplers I angiogrammed him May 18, 2012 revealing moderate in-stent restenosis within the right carotid stent. He ultimately underwent aortic valve replacement with an [place] TFX bioprosthesis as well as SVG to RCA by Dr. Ceasar Mons June 14, 2012. His followup 2D echo was normal. Tomorrow is his last day of cardiac rehab and he has recuperated nicely. His creatinines have remained stable. His other problems include hypertension, hyperlipidemia, and non-insulin-requiring diabetes. His most recent carotid Doppler performed in February revealed stable velocities on the right correlating to 50-60% in-stent restenosis. Since I saw him 12 months ago he he remains completely stable. He denies chest pain or shortness of breath.he had a recent 2-D echo showed normal systolic function with a well functioning aortic bioprosthesis. Recent carotid Doppler showed progression of disease in his right carotid stent probably in the 80-90% range. He is on aspirin Plavix and remains neurologically asymptomatic. His major complaint is of pain in his right second toe with a nonhealing ulcer probably related to PAD. His serum creatinines are elevated making intervention somewhat  problematic.he has been seeing the wound care center has been getting hyperbaric therapy. His wound is slowly healing. He also has had some bursitis and cellulitis in the right leg and is getting physical therapy. They did note note bradycardia however he is only on low-dose beta blocker as well as amiodarone for PAF. I don't think this is an issue. His blood pressure is moderately elevated and we will adjust his Norvasc.   Current Outpatient Prescriptions  Medication Sig Dispense Refill  . amiodarone (PACERONE) 200 MG tablet Take 1 tablet (200 mg total) by mouth daily. 90 tablet 3  . amLODipine (NORVASC) 10 MG tablet Take 1 tablet (10 mg total) by mouth daily. 30 tablet 11  . aspirin EC 81 MG tablet Take 81 mg by mouth 2 (two) times daily.    Marland Kitchen atorvastatin (LIPITOR) 40 MG tablet TAKE 1 TABLET DAILY AT BEDTIME (Patient taking differently: take one tablet every other day) 90 tablet 2  . docusate sodium (STOOL SOFTENER) 100 MG capsule Take 100 mg by mouth 3 (three) times a week.    Marland Kitchen Epoetin Alfa (PROCRIT IJ) Inject as directed.    . fish oil-omega-3 fatty acids 1000 MG capsule Take 1 g by mouth 3 (three) times daily.    . furosemide (LASIX) 80 MG tablet Take 80 mg by mouth. Takes 1 tablet in the morning and 1/2 tablet in the evening    . glipiZIDE (GLUCOTROL) 10 MG tablet Take 5-10 mg by mouth 2 (two) times daily before a meal. Takes 10 mg in the morning and 5 mg in the evening    . hydrALAZINE (APRESOLINE) 50 MG tablet Take 50 mg by mouth 3 (three) times daily.    . Iron TABS Take 160 mg by mouth daily.    Marland Kitchen  LEVEMIR 100 UNIT/ML injection Inject 20 Units into the skin at bedtime.    . metoprolol tartrate (LOPRESSOR) 25 MG tablet TAKE ONE-HALF (1/2) TABLET (12.5 MG TOTAL) TWICE A DAY 90 tablet 3  . Multiple Vitamin (MULTIVITAMIN WITH MINERALS) TABS Take 1 tablet by mouth daily.    . pantoprazole (PROTONIX) 40 MG tablet Take 40 mg by mouth daily.    . silver sulfADIAZINE (SILVADENE) 1 % cream as  needed.    . sodium bicarbonate 650 MG tablet Take 650 mg by mouth 2 (two) times daily.    . solifenacin (VESICARE) 5 MG tablet Take 5 mg by mouth daily.     . Tamsulosin HCl (FLOMAX) 0.4 MG CAPS Take 0.4 mg by mouth. One tablet in the morning and 1/2 tablet in the evening    . vitamin E (VITAMIN E) 400 UNIT capsule Take 400 Units by mouth daily.     No current facility-administered medications for this visit.    No Known Allergies  History   Social History  . Marital Status: Married    Spouse Name: N/A    Number of Children: N/A  . Years of Education: N/A   Occupational History  . Not on file.   Social History Main Topics  . Smoking status: Never Smoker   . Smokeless tobacco: Never Used  . Alcohol Use: No  . Drug Use: No  . Sexual Activity: Not Currently   Other Topics Concern  . Not on file   Social History Narrative     Review of Systems: General: negative for chills, fever, night sweats or weight changes.  Cardiovascular: negative for chest pain, dyspnea on exertion, edema, orthopnea, palpitations, paroxysmal nocturnal dyspnea or shortness of breath Dermatological: negative for rash Respiratory: negative for cough or wheezing Urologic: negative for hematuria Abdominal: negative for nausea, vomiting, diarrhea, bright red blood per rectum, melena, or hematemesis Neurologic: negative for visual changes, syncope, or dizziness All other systems reviewed and are otherwise negative except as noted above.    Blood pressure 162/64, pulse 52, height 5' 5.5" (1.664 m), weight 146 lb (66.225 kg).  General appearance: alert and no distress Neck: no adenopathy, no JVD, supple, symmetrical, trachea midline, thyroid not enlarged, symmetric, no tenderness/mass/nodules and bilateral carotid bruits Lungs: clear to auscultation bilaterally Heart: regular rate and rhythm, S1, S2 normal, no murmur, click, rub or gallop Extremities: healing right toe ulcer  EKG sinus bradycardia 52  with right bundle-branch block and left anterior fascicular block. I personally reviewed this EKG  ASSESSMENT AND PLAN:   S/P CABG x 1 with tissue AVR 06/13/12 History of coronary artery disease status post coronary artery bypass grafting with a vein to the RCA by Dr. Servando Snare 06/14/12. He also had aortic valve replacement simultaneously with an [employer] bioprosthesis. His most recent 2-D echo performed in May of last year revealed normal LV systolic function with a well functioning aortic bioprosthesis.  PVD, with hx of bilateral CEA at [facility], and previous bilateral carotid stenting, with high grade ISR of Rt. carotid 05/18/12 History of carotid artery disease status post bilateral carotid endarterectomies performed at [facility] in [place] and subsequent bilateral carotid stenting performed in [place]. His most recent carotid Dopplers performed in September of last year showed high-grade in-stent restenosis" within the right carotid stent. He is neurologically asymptomatic. We will continue to follow him by Ultrasound.  PAF (paroxysmal atrial fibrillation), s/p cabg History of PAF maintaining sinus rhythm on amiodarone  HTN (hypertension) History  of hypertension with blood pressure measured today at 162/64. He is on amlodipine, hydralazine, low-dose metoprolol. He is somewhat bradycardic but I'll think he is symptomatic from this. I have looked at his heart rate and blood pressure logs as blood pressure has ranged from the high 40s to the low to mid 60s. I'm going to increase his amlodipine from 5-10 mg a day.  Peripheral arterial disease History of poorly healing ulcer on his right second toe. He is being treated at the wound Center and is getting hyperbaric therapy. His most recent Dopplers performed 06/04/14 revealed a right ABI 0.49 and a left of 0.6. He did have significant tibial vessel disease with an occluded anterior and posterior tibial artery.  This appears to be slowly healing. Percutaneous route auscultation would be high risk given his moderate to severe renal insufficiency and the potential for radiocontrast nephropathy.      Lorretta Harp MD FACP,FACC,FAHA, [facility] 08/22/2014 11:44 AM

## 2014-08-22 NOTE — Assessment & Plan Note (Addendum)
History of carotid artery disease status post bilateral carotid endarterectomies performed at [facility] in [place] and subsequent bilateral carotid stenting performed in [place]. His most recent carotid Dopplers performed in September of last year showed high-grade in-stent restenosis" within the right carotid stent. He is neurologically asymptomatic. We will continue to follow him by Ultrasound.

## 2014-08-22 NOTE — Assessment & Plan Note (Signed)
History of poorly healing ulcer on his right second toe. He is being treated at the wound Center and is getting hyperbaric therapy. His most recent Dopplers performed 06/04/14 revealed a right ABI 0.49 and a left of 0.6. He did have significant tibial vessel disease with an occluded anterior and posterior tibial artery. This appears to be slowly healing. Percutaneous route auscultation would be high risk given his moderate to severe renal insufficiency and the potential for radiocontrast nephropathy.

## 2014-08-28 ENCOUNTER — Telehealth: Payer: Self-pay | Admitting: Cardiovascular Disease

## 2014-08-28 NOTE — Telephone Encounter (Signed)
Please call,need you to call [facility] concerning his medicine.She will give you the details when you call her.

## 2014-08-28 NOTE — Telephone Encounter (Signed)
Contacted pt's caregiver, she stated problem getting meds from [facility]. Gave case number for this.  Contacted [facility] for patient, they wanted to send out 90 day supply in lieu of 30 day supply, I advised this was fine.   Contacted caregiver back, stated call handled, she expressed understanding. Told to call for any future needs.

## 2014-10-01 ENCOUNTER — Other Ambulatory Visit (HOSPITAL_COMMUNITY): Payer: Self-pay | Admitting: Cardiovascular Disease

## 2014-10-01 DIAGNOSIS — I6529 Occlusion and stenosis of unspecified carotid artery: Secondary | ICD-10-CM

## 2014-10-20 ENCOUNTER — Ambulatory Visit (HOSPITAL_COMMUNITY)
Admission: RE | Admit: 2014-10-20 | Discharge: 2014-10-20 | Disposition: A | Discharge status: Home / Self Care | Payer: PRIVATE HEALTH INSURANCE | Source: Ambulatory Visit | Attending: Cardiovascular Disease | Admitting: Cardiovascular Disease

## 2014-10-20 DIAGNOSIS — I779 Disorder of arteries and arterioles, unspecified: Secondary | ICD-10-CM

## 2014-10-20 DIAGNOSIS — I6529 Occlusion and stenosis of unspecified carotid artery: Secondary | ICD-10-CM | POA: Insufficient documentation

## 2014-10-20 NOTE — Progress Notes (Signed)
Carotid Duplex Completed. Collin Pearson, BS, RDMS, RVT  

## 2014-10-27 ENCOUNTER — Encounter: Payer: Self-pay | Admitting: *Deleted

## 2014-11-14 ENCOUNTER — Encounter (HOSPITAL_COMMUNITY): Payer: PRIVATE HEALTH INSURANCE

## 2015-01-01 ENCOUNTER — Other Ambulatory Visit: Payer: Self-pay | Admitting: *Deleted

## 2015-01-01 MED ORDER — AMLODIPINE BESYLATE 10 MG PO TABS: 10.0000 mg | ORAL_TABLET | Freq: Every day | ORAL | Status: DC

## 2015-01-01 NOTE — Telephone Encounter (Signed)
Rx(s) sent to pharmacy electronically.  

## 2015-02-03 ENCOUNTER — Other Ambulatory Visit: Payer: Self-pay

## 2015-02-03 ENCOUNTER — Inpatient Hospital Stay (HOSPITAL_COMMUNITY)
Admission: AD | Admit: 2015-02-03 | Discharge: 2015-02-11 | DRG: 308 | Disposition: A | Discharge status: Home Health | Payer: PRIVATE HEALTH INSURANCE | Source: Other Acute Inpatient Hospital | Attending: Internal Medicine | Admitting: Internal Medicine

## 2015-02-03 DIAGNOSIS — R001 Bradycardia, unspecified: Secondary | ICD-10-CM | POA: Diagnosis present

## 2015-02-03 DIAGNOSIS — E11649 Type 2 diabetes mellitus with hypoglycemia without coma: Secondary | ICD-10-CM | POA: Diagnosis present

## 2015-02-03 DIAGNOSIS — Z953 Presence of xenogenic heart valve: Secondary | ICD-10-CM | POA: Diagnosis not present

## 2015-02-03 DIAGNOSIS — Z7982 Long term (current) use of aspirin: Secondary | ICD-10-CM | POA: Diagnosis not present

## 2015-02-03 DIAGNOSIS — D649 Anemia, unspecified: Secondary | ICD-10-CM | POA: Diagnosis present

## 2015-02-03 DIAGNOSIS — N189 Chronic kidney disease, unspecified: Secondary | ICD-10-CM | POA: Diagnosis present

## 2015-02-03 DIAGNOSIS — E875 Hyperkalemia: Secondary | ICD-10-CM | POA: Diagnosis present

## 2015-02-03 DIAGNOSIS — I5033 Acute on chronic diastolic (congestive) heart failure: Secondary | ICD-10-CM | POA: Diagnosis present

## 2015-02-03 DIAGNOSIS — I129 Hypertensive chronic kidney disease with stage 1 through stage 4 chronic kidney disease, or unspecified chronic kidney disease: Secondary | ICD-10-CM | POA: Diagnosis present

## 2015-02-03 DIAGNOSIS — R0902 Hypoxemia: Secondary | ICD-10-CM | POA: Diagnosis not present

## 2015-02-03 DIAGNOSIS — T462X5A Adverse effect of other antidysrhythmic drugs, initial encounter: Secondary | ICD-10-CM | POA: Diagnosis present

## 2015-02-03 DIAGNOSIS — T447X5A Adverse effect of beta-adrenoreceptor antagonists, initial encounter: Secondary | ICD-10-CM | POA: Diagnosis present

## 2015-02-03 DIAGNOSIS — N179 Acute kidney failure, unspecified: Secondary | ICD-10-CM | POA: Diagnosis present

## 2015-02-03 DIAGNOSIS — I2510 Atherosclerotic heart disease of native coronary artery without angina pectoris: Secondary | ICD-10-CM | POA: Diagnosis present

## 2015-02-03 DIAGNOSIS — G9340 Encephalopathy, unspecified: Secondary | ICD-10-CM | POA: Diagnosis present

## 2015-02-03 DIAGNOSIS — Z951 Presence of aortocoronary bypass graft: Secondary | ICD-10-CM

## 2015-02-03 DIAGNOSIS — I482 Chronic atrial fibrillation: Secondary | ICD-10-CM | POA: Diagnosis not present

## 2015-02-03 DIAGNOSIS — Z978 Presence of other specified devices: Secondary | ICD-10-CM | POA: Diagnosis present

## 2015-02-03 DIAGNOSIS — E1151 Type 2 diabetes mellitus with diabetic peripheral angiopathy without gangrene: Secondary | ICD-10-CM | POA: Diagnosis present

## 2015-02-03 DIAGNOSIS — E871 Hypo-osmolality and hyponatremia: Secondary | ICD-10-CM | POA: Diagnosis present

## 2015-02-03 DIAGNOSIS — I480 Paroxysmal atrial fibrillation: Secondary | ICD-10-CM | POA: Diagnosis present

## 2015-02-03 DIAGNOSIS — R5381 Other malaise: Secondary | ICD-10-CM | POA: Diagnosis not present

## 2015-02-03 DIAGNOSIS — D72829 Elevated white blood cell count, unspecified: Secondary | ICD-10-CM | POA: Diagnosis not present

## 2015-02-03 DIAGNOSIS — N170 Acute kidney failure with tubular necrosis: Secondary | ICD-10-CM | POA: Diagnosis present

## 2015-02-03 DIAGNOSIS — I452 Bifascicular block: Secondary | ICD-10-CM | POA: Diagnosis present

## 2015-02-03 DIAGNOSIS — Z794 Long term (current) use of insulin: Secondary | ICD-10-CM | POA: Diagnosis not present

## 2015-02-03 DIAGNOSIS — I10 Essential (primary) hypertension: Secondary | ICD-10-CM | POA: Diagnosis not present

## 2015-02-03 DIAGNOSIS — E162 Hypoglycemia, unspecified: Secondary | ICD-10-CM | POA: Diagnosis present

## 2015-02-03 DIAGNOSIS — K219 Gastro-esophageal reflux disease without esophagitis: Secondary | ICD-10-CM | POA: Diagnosis present

## 2015-02-03 DIAGNOSIS — I453 Trifascicular block: Secondary | ICD-10-CM | POA: Diagnosis present

## 2015-02-03 DIAGNOSIS — E1122 Type 2 diabetes mellitus with diabetic chronic kidney disease: Secondary | ICD-10-CM | POA: Diagnosis present

## 2015-02-03 DIAGNOSIS — N184 Chronic kidney disease, stage 4 (severe): Secondary | ICD-10-CM | POA: Diagnosis present

## 2015-02-03 DIAGNOSIS — I959 Hypotension, unspecified: Secondary | ICD-10-CM | POA: Diagnosis present

## 2015-02-03 DIAGNOSIS — Z4659 Encounter for fitting and adjustment of other gastrointestinal appliance and device: Secondary | ICD-10-CM | POA: Diagnosis present

## 2015-02-03 DIAGNOSIS — R55 Syncope and collapse: Secondary | ICD-10-CM | POA: Diagnosis present

## 2015-02-03 DIAGNOSIS — Z789 Other specified health status: Secondary | ICD-10-CM | POA: Diagnosis not present

## 2015-02-03 DIAGNOSIS — I4891 Unspecified atrial fibrillation: Secondary | ICD-10-CM | POA: Diagnosis present

## 2015-02-03 DIAGNOSIS — J811 Chronic pulmonary edema: Secondary | ICD-10-CM

## 2015-02-03 DIAGNOSIS — J810 Acute pulmonary edema: Secondary | ICD-10-CM | POA: Diagnosis not present

## 2015-02-03 DIAGNOSIS — E872 Acidosis: Secondary | ICD-10-CM | POA: Diagnosis present

## 2015-02-03 LAB — CBC
HCT: 24.9 % — ABNORMAL LOW (ref 39.0–52.0)
Hemoglobin: 8.3 g/dL — ABNORMAL LOW (ref 13.0–17.0)
MCH: 32.2 pg (ref 26.0–34.0)
MCHC: 33.3 g/dL (ref 30.0–36.0)
MCV: 96.5 fL (ref 78.0–100.0)
Platelets: 124 10*3/uL — ABNORMAL LOW (ref 150–400)
RBC: 2.58 MIL/uL — ABNORMAL LOW (ref 4.22–5.81)
RDW: 18 % — ABNORMAL HIGH (ref 11.5–15.5)
WBC: 9.9 10*3/uL (ref 4.0–10.5)

## 2015-02-03 LAB — COMPREHENSIVE METABOLIC PANEL
ALK PHOS: 72 U/L (ref 38–126)
ALT: 104 U/L — AB (ref 17–63)
ANION GAP: 16 — AB (ref 5–15)
AST: 103 U/L — ABNORMAL HIGH (ref 15–41)
Albumin: 2.7 g/dL — ABNORMAL LOW (ref 3.5–5.0)
BUN: 77 mg/dL — AB (ref 6–20)
CO2: 19 mmol/L — AB (ref 22–32)
Calcium: 8.2 mg/dL — ABNORMAL LOW (ref 8.9–10.3)
Chloride: 99 mmol/L — ABNORMAL LOW (ref 101–111)
Creatinine, Ser: 3.63 mg/dL — ABNORMAL HIGH (ref 0.61–1.24)
GFR, EST AFRICAN AMERICAN: 16 mL/min — AB (ref >60)
GFR, EST NON AFRICAN AMERICAN: 14 mL/min — AB (ref >60)
Glucose, Bld: 283 mg/dL — ABNORMAL HIGH (ref 65–99)
POTASSIUM: 4 mmol/L (ref 3.5–5.1)
SODIUM: 134 mmol/L — AB (ref 135–145)
Total Bilirubin: 0.7 mg/dL (ref 0.3–1.2)
Total Protein: 5.2 g/dL — ABNORMAL LOW (ref 6.5–8.1)

## 2015-02-03 LAB — TSH: TSH: 5.182 u[IU]/mL — AB (ref 0.350–4.500)

## 2015-02-03 LAB — MRSA PCR SCREENING: MRSA by PCR: NEGATIVE

## 2015-02-03 LAB — MAGNESIUM: MAGNESIUM: 2.2 mg/dL (ref 1.7–2.4)

## 2015-02-03 LAB — GLUCOSE, CAPILLARY
GLUCOSE-CAPILLARY: 274 mg/dL — AB (ref 65–99)
GLUCOSE-CAPILLARY: 267 mg/dL — AB (ref 65–99)

## 2015-02-03 LAB — TROPONIN I: TROPONIN I: 0.04 ng/mL — AB (ref <0.031)

## 2015-02-03 LAB — PHOSPHORUS: Phosphorus: 3.9 mg/dL (ref 2.5–4.6)

## 2015-02-03 LAB — LACTIC ACID, PLASMA: Lactic Acid, Venous: 3.7 mmol/L (ref 0.5–2.0)

## 2015-02-03 MED ORDER — INSULIN ASPART 100 UNIT/ML ~~LOC~~ SOLN
0.0000 [IU] | SUBCUTANEOUS | Status: DC
  Administered 2015-02-04 (×3): 5 [IU] via SUBCUTANEOUS

## 2015-02-03 MED ORDER — PANTOPRAZOLE SODIUM 40 MG IV SOLR
40.0000 mg | INTRAVENOUS | Status: DC
  Administered 2015-02-04 – 2015-02-06 (×3): 40 mg via INTRAVENOUS
  Filled 2015-02-03 (×5): qty 40

## 2015-02-03 MED ORDER — SODIUM CHLORIDE 0.9 % IV SOLN: 250.0000 mL | INTRAVENOUS | Status: DC | PRN

## 2015-02-03 MED ORDER — HEPARIN SODIUM (PORCINE) 5000 UNIT/ML IJ SOLN
5000.0000 [IU] | Freq: Three times a day (TID) | INTRAMUSCULAR | Status: DC
  Administered 2015-02-03 – 2015-02-11 (×24): 5000 [IU] via SUBCUTANEOUS
  Filled 2015-02-03 (×30): qty 1

## 2015-02-03 MED ORDER — SODIUM CHLORIDE 0.9 % IV SOLN: INTRAVENOUS | Status: DC

## 2015-02-03 MED ORDER — VANCOMYCIN HCL IN DEXTROSE 1-5 GM/200ML-% IV SOLN
1000.0000 mg | Freq: Once | INTRAVENOUS | Status: AC
  Administered 2015-02-03: 1000 mg via INTRAVENOUS
  Filled 2015-02-03: qty 200

## 2015-02-03 MED ORDER — ISOPROTERENOL HCL 0.2 MG/ML IJ SOLN
2.0000 ug/min | INTRAVENOUS | Status: DC
  Administered 2015-02-03: 5 ug/min via INTRAVENOUS
  Administered 2015-02-04: 10 ug/min via INTRAVENOUS
  Administered 2015-02-04: 8 ug/min via INTRAVENOUS
  Administered 2015-02-04: 10 ug/min via INTRAVENOUS
  Administered 2015-02-04 (×2): 9 ug/min via INTRAVENOUS
  Filled 2015-02-03 (×7): qty 5

## 2015-02-03 NOTE — H&P (Signed)
PULMONARY / CRITICAL CARE MEDICINE   Name: ZADOK GROMAN MRN: [id number] DOB: [birth year]    ADMISSION DATE:  02/03/2015 CONSULTATION DATE:  02/03/2015  REFERRING MD :  [facility]  CHIEF COMPLAINT:  Bradycardia  INITIAL PRESENTATION:  81 y.o. M brought to [facility] with pre-syncope, found to have bradycardia in 20's.  Started on isoproterenol and transferred to [facility] ICU.   STUDIES:   SIGNIFICANT EVENTS: 6/21 - admit.   HISTORY OF PRESENT ILLNESS:   Collin Pearson is a 81 y.o. M with PMH as outlined below including significant cardiac hx who was taken to [facility] ED 6/21 for pre-syncope.  He apparently felt as if he was about to pass out, but never did.  He remained conscious and was able to talk to his wife who called EMS.  On EMS arrival, he was found to be bradycardic with HR in 20's.  He was started on TC pacing en route to [facility] ED.  Once at [facility], he was started on isoproterenol and transferred to [facility] for further evaluation and management.  Only significant labs at [place] were K of 5.8, BUN 75, SCr 3.5.  After chart review, per Dr. Kennon Holter notes, pt has had hx of bradycardia.  He is on low dose beta blocker as well as amiodarone for PAF.  He is also on CCB for HTN.   PAST MEDICAL HISTORY :   has a past medical history of Clotting disorder; Coronary artery disease; AS (aortic stenosis); End stage renal disease; Anginal pain; Hypertension; Diabetes mellitus; Peripheral vascular disease; GERD (gastroesophageal reflux disease); CKD (chronic kidney disease) stage 4, GFR 15-29 ml/min; PVD (peripheral vascular disease), with hsx of bilateral carotid endarterectomies at [facility], and bilateral carotid stenting (05/18/2012); Angina effort (05/18/2012); Shortness of breath; Heart murmur; PAF (paroxysmal atrial fibrillation), s/p cabg (06/18/2012); and Critical lower limb ischemia.  has past surgical history that includes Endarterectomy; Prostate surgery; cardiac stents; Cardiac  catheterization; Coronary artery bypass graft (06/13/2012); Aortic valve replacement (06/13/2012); Cardiac Stress Test (06/03/2010); left and right heart catheterization with coronary angiogram (N/A, 05/18/2012); and carotid angiogram (N/A, 05/18/2012). Prior to Admission medications   Medication Sig Start Date End Date Taking? Authorizing Provider  amiodarone (PACERONE) 200 MG tablet Take 1 tablet (200 mg total) by mouth daily. 07/22/14  Yes Pixie Casino, MD  amLODipine (NORVASC) 10 MG tablet Take 1 tablet (10 mg total) by mouth daily. 01/01/15  Yes Lorretta Harp, MD  aspirin EC 81 MG tablet Take 81 mg by mouth 2 (two) times daily.   Yes Historical Provider, MD  atorvastatin (LIPITOR) 40 MG tablet TAKE 1 TABLET DAILY AT BEDTIME Patient taking differently: No sig reported 08/04/14  Yes Lorretta Harp, MD  clotrimazole-betamethasone (LOTRISONE) cream Apply 1 application topically daily as needed (dry skin, itching).  01/07/15  Yes Historical Provider, MD  docusate sodium (STOOL SOFTENER) 100 MG capsule Take 100 mg by mouth 3 (three) times a week.   Yes Historical Provider, MD  Epoetin Alfa (PROCRIT IJ) Inject 1 Dose as directed as needed.    Yes Historical Provider, MD  fish oil-omega-3 fatty acids 1000 MG capsule Take 1 g by mouth 3 (three) times daily.   Yes Historical Provider, MD  furosemide (LASIX) 80 MG tablet Take 80 mg by mouth. Takes 1 tablet in the morning and 1/2 tablet in the evening 04/10/13  Yes Historical Provider, MD  glipiZIDE (GLUCOTROL) 10 MG tablet Take 5-10 mg by mouth 2 (two) times daily before a meal. Takes  10 mg in the morning and 5 mg in the evening   Yes Historical Provider, MD  hydrALAZINE (APRESOLINE) 50 MG tablet Take 50 mg by mouth 3 (three) times daily.   Yes Historical Provider, MD  Iron TABS Take 160 mg by mouth 2 (two) times daily.    Yes Historical Provider, MD  LEVEMIR 100 UNIT/ML injection Inject 20 Units into the skin at bedtime. 04/28/13  Yes Historical  Provider, MD  metoprolol tartrate (LOPRESSOR) 25 MG tablet TAKE ONE-HALF (1/2) TABLET (12.5 MG TOTAL) TWICE A DAY 07/22/14  Yes Pixie Casino, MD  Multiple Vitamin (MULTIVITAMIN WITH MINERALS) TABS Take 1 tablet by mouth daily.   Yes Historical Provider, MD  ONE TOUCH ULTRA TEST test strip  01/09/15  Yes Historical Provider, MD  pantoprazole (PROTONIX) 40 MG tablet Take 40 mg by mouth daily.   Yes Historical Provider, MD  silver sulfADIAZINE (SILVADENE) 1 % cream Apply 1 application topically as needed (dry skin).  02/22/13  Yes Historical Provider, MD  sodium bicarbonate 650 MG tablet Take 650 mg by mouth daily as needed for heartburn.    Yes Historical Provider, MD  solifenacin (VESICARE) 5 MG tablet Take 5 mg by mouth daily.    Yes Historical Provider, MD  Tamsulosin HCl (FLOMAX) 0.4 MG CAPS Take 0.4 mg by mouth. One tablet in the morning and 1/2 tablet in the evening   Yes Historical Provider, MD  vitamin E (VITAMIN E) 400 UNIT capsule Take 400 Units by mouth daily.   Yes Historical Provider, MD  BD INSULIN SYRINGE ULTRAFINE 31G X 5/16" 1 ML MISC  12/11/14   Historical Provider, MD   No Known Allergies  FAMILY HISTORY:  Family History  Problem Relation Age of Onset  . Heart disease Mother 3  . Heart disease Father 53  . CAD Brother   . Heart disease Maternal Uncle   . Heart disease Paternal Uncle   . Heart disease Maternal Grandfather 88    SOCIAL HISTORY:  reports that he has never smoked. He has never used smokeless tobacco. He reports that he does not drink alcohol or use illicit drugs.  REVIEW OF SYSTEMS:  All negative; except for those that are bolded, which indicate positives.  Constitutional: weight loss, weight gain, night sweats, fevers, chills, fatigue, weakness.  HEENT: headaches, sore throat, sneezing, nasal congestion, post nasal drip, difficulty swallowing, tooth/dental problems, visual complaints, visual changes, ear aches. Neuro: difficulty with speech, weakness,  numbness, ataxia. CV:  chest pain, orthopnea, PND, swelling in lower extremities, dizziness, palpitations, syncope.  Resp: cough, hemoptysis, dyspnea, wheezing. GI  heartburn, indigestion, abdominal pain, nausea, vomiting, diarrhea, constipation, change in bowel habits, loss of appetite, hematemesis, melena, hematochezia.  GU: dysuria, change in color of urine, urgency or frequency, flank pain, hematuria. MSK: joint pain or swelling, decreased range of motion. Psych: change in mood or affect, depression, anxiety, suicidal ideations, homicidal ideations. Skin: rash, itching, bruising.   SUBJECTIVE:   VITAL SIGNS: Temp:  [98.1 F (36.7 C)-98.5 F (36.9 C)] 98.1 F (36.7 C) (06/21 2005) HEMODYNAMICS:   VENTILATOR SETTINGS:   INTAKE / OUTPUT: Intake/Output    None     PHYSICAL EXAMINATION: General: Elderly male, in NAD. Neuro: A&O x 3, non-focal.  HEENT: [place]/AT. PERRL, sclerae anicteric. Cardiovascular: RRR, no M/R/G.  Lungs: Respirations even and unlabored.  CTA bilaterally. Abdomen: BS x 4, soft, NT/ND.  Musculoskeletal: No gross deformities, 3+ pitting edema bilateral LE's. Skin: Intact, warm, no rashes.  LABS:  CBC  No results for input(s): WBC, HGB, HCT, PLT in the last 168 hours. Coag's No results for input(s): APTT, INR in the last 168 hours. BMET No results for input(s): NA, K, CL, CO2, BUN, CREATININE, GLUCOSE in the last 168 hours. Electrolytes No results for input(s): CALCIUM, MG, PHOS in the last 168 hours. Sepsis Markers No results for input(s): LATICACIDVEN, PROCALCITON, O2SATVEN in the last 168 hours. ABG No results for input(s): PHART, PCO2ART, PO2ART in the last 168 hours. Liver Enzymes No results for input(s): AST, ALT, ALKPHOS, BILITOT, ALBUMIN in the last 168 hours. Cardiac Enzymes No results for input(s): TROPONINI, PROBNP in the last 168 hours. Glucose  Recent Labs Lab 02/03/15 1900 02/03/15 2004  GLUCAP 274* 267*    Imaging No results  found.   ASSESSMENT / PLAN:  PULMONARY A: No acute issues. P:   Pulmonary hygiene.  CARDIOVASCULAR A:  Sinus bradycardia - with EMS as low as 20's, now improved on isoproterenol. Has not required pacing since arrival to [facility]  Could be due to decreased clearance of outpatient meds given his AoCKD. Troponin leak. Hx CAD, HTN, AS s/p AVR, PVD, PAF (not on anticoagulation). P:  Continue isoproterenol. Cardiology consulted. Hold outpatient amiodarone, amlodipine, atorvastatin, furosemide, hydralazine, lopressor.  RENAL A:   AoCKD. AGMA. Pseudohypocalcemia - corrects to 9.24. P:   NS @ 100. Send ionized calcium. BMP in AM.  GASTROINTESTINAL A:   GERD. Transaminitis - chronic. Nutrition. P:   Continue Pantoprazole. Liquids for now.  HEMATOLOGIC A:   Anemia. VTE Prophylaxis. P:  Transfuse for Hgb < 8 given cardiac hx. SCD's / Heparin. CBC in AM.  INFECTIOUS A:   No indication of infection. P:   BCx 6/21 > Monitor clinically.  ENDOCRINE A:   DM.   P:   SSI. Hold outpatient levemir, glipizide. Check TSH.  NEUROLOGIC A:   No acute issues. P:   No interventions required.  Family updated: Wife at bedside.  Interdisciplinary Family Meeting v Palliative Care Meeting:  Due by: 6/27.   Montey Hora, [place] [facility] Pager: [phone]  or [phone] 02/03/2015, 9:57 PM

## 2015-02-03 NOTE — Progress Notes (Signed)
CRITICAL VALUE ALERT  Critical value received:  Lactic Acid 3.7  Date of notification:  6/21  Time of notification: 2345  Critical value read back:Yes.    Nurse who received alert:  Bryson Dames RN  MD notified (1st page):  Warren Lacy MD  Time of first page:    MD notified (2nd page):  Time of second page:  Responding MD:  Warren Lacy MD  Time MD responded:  [id number]

## 2015-02-04 ENCOUNTER — Inpatient Hospital Stay (HOSPITAL_COMMUNITY): Payer: PRIVATE HEALTH INSURANCE

## 2015-02-04 ENCOUNTER — Other Ambulatory Visit (HOSPITAL_COMMUNITY): Payer: PRIVATE HEALTH INSURANCE

## 2015-02-04 ENCOUNTER — Encounter (HOSPITAL_COMMUNITY)
Admission: AD | Disposition: A | Discharge status: Home Health | Payer: Self-pay | Source: Other Acute Inpatient Hospital | Attending: Internal Medicine

## 2015-02-04 ENCOUNTER — Encounter (HOSPITAL_COMMUNITY): Payer: Self-pay | Admitting: Emergency Medicine

## 2015-02-04 DIAGNOSIS — R001 Bradycardia, unspecified: Secondary | ICD-10-CM

## 2015-02-04 DIAGNOSIS — I453 Trifascicular block: Principal | ICD-10-CM

## 2015-02-04 DIAGNOSIS — I480 Paroxysmal atrial fibrillation: Secondary | ICD-10-CM

## 2015-02-04 DIAGNOSIS — Z789 Other specified health status: Secondary | ICD-10-CM

## 2015-02-04 DIAGNOSIS — J811 Chronic pulmonary edema: Secondary | ICD-10-CM | POA: Diagnosis present

## 2015-02-04 DIAGNOSIS — R55 Syncope and collapse: Secondary | ICD-10-CM

## 2015-02-04 DIAGNOSIS — J810 Acute pulmonary edema: Secondary | ICD-10-CM

## 2015-02-04 DIAGNOSIS — Z4659 Encounter for fitting and adjustment of other gastrointestinal appliance and device: Secondary | ICD-10-CM | POA: Diagnosis present

## 2015-02-04 DIAGNOSIS — R0902 Hypoxemia: Secondary | ICD-10-CM

## 2015-02-04 DIAGNOSIS — Z978 Presence of other specified devices: Secondary | ICD-10-CM | POA: Diagnosis present

## 2015-02-04 LAB — POCT I-STAT 3, ART BLOOD GAS (G3+)
ACID-BASE DEFICIT: 3 mmol/L — AB (ref 0.0–2.0)
ACID-BASE DEFICIT: 4 mmol/L — AB (ref 0.0–2.0)
BICARBONATE: 20.3 meq/L (ref 20.0–24.0)
Bicarbonate: 20 mEq/L (ref 20.0–24.0)
O2 Saturation: 96 %
O2 Saturation: 98 %
PH ART: 7.457 — AB (ref 7.350–7.450)
Patient temperature: 98.6
Patient temperature: 99
TCO2: 21 mmol/L (ref 0–100)
TCO2: 21 mmol/L (ref 0–100)
pCO2 arterial: 28.7 mmHg — ABNORMAL LOW (ref 35.0–45.0)
pCO2 arterial: 32.5 mmHg — ABNORMAL LOW (ref 35.0–45.0)
pH, Arterial: 7.397 (ref 7.350–7.450)
pO2, Arterial: 75 mmHg — ABNORMAL LOW (ref 80.0–100.0)
pO2, Arterial: 110 mmHg — ABNORMAL HIGH (ref 80.0–100.0)

## 2015-02-04 LAB — CBC
HEMATOCRIT: 24.9 % — AB (ref 39.0–52.0)
HEMOGLOBIN: 8.2 g/dL — AB (ref 13.0–17.0)
MCH: 31.1 pg (ref 26.0–34.0)
MCHC: 32.9 g/dL (ref 30.0–36.0)
MCV: 94.3 fL (ref 78.0–100.0)
Platelets: 143 10*3/uL — ABNORMAL LOW (ref 150–400)
RBC: 2.64 MIL/uL — ABNORMAL LOW (ref 4.22–5.81)
RDW: 18.2 % — ABNORMAL HIGH (ref 11.5–15.5)
WBC: 11.9 10*3/uL — ABNORMAL HIGH (ref 4.0–10.5)

## 2015-02-04 LAB — BASIC METABOLIC PANEL
ANION GAP: 14 (ref 5–15)
Anion gap: 13 (ref 5–15)
BUN: 73 mg/dL — ABNORMAL HIGH (ref 6–20)
BUN: 72 mg/dL — AB (ref 6–20)
CALCIUM: 8.2 mg/dL — AB (ref 8.9–10.3)
CALCIUM: 8.4 mg/dL — AB (ref 8.9–10.3)
CO2: 20 mmol/L — AB (ref 22–32)
CO2: 21 mmol/L — ABNORMAL LOW (ref 22–32)
CREATININE: 3.7 mg/dL — AB (ref 0.61–1.24)
Chloride: 99 mmol/L — ABNORMAL LOW (ref 101–111)
Chloride: 98 mmol/L — ABNORMAL LOW (ref 101–111)
Creatinine, Ser: 3.82 mg/dL — ABNORMAL HIGH (ref 0.61–1.24)
GFR calc Af Amer: 16 mL/min — ABNORMAL LOW (ref >60)
GFR calc Af Amer: 15 mL/min — ABNORMAL LOW (ref >60)
GFR calc non Af Amer: 14 mL/min — ABNORMAL LOW (ref >60)
GFR calc non Af Amer: 13 mL/min — ABNORMAL LOW (ref >60)
GLUCOSE: 191 mg/dL — AB (ref 65–99)
Glucose, Bld: 302 mg/dL — ABNORMAL HIGH (ref 65–99)
POTASSIUM: 4.4 mmol/L (ref 3.5–5.1)
Potassium: 4 mmol/L (ref 3.5–5.1)
SODIUM: 133 mmol/L — AB (ref 135–145)
Sodium: 132 mmol/L — ABNORMAL LOW (ref 135–145)

## 2015-02-04 LAB — GLUCOSE, CAPILLARY
GLUCOSE-CAPILLARY: 161 mg/dL — AB (ref 65–99)
Glucose-Capillary: 125 mg/dL — ABNORMAL HIGH (ref 65–99)
Glucose-Capillary: 162 mg/dL — ABNORMAL HIGH (ref 65–99)
Glucose-Capillary: 214 mg/dL — ABNORMAL HIGH (ref 65–99)
Glucose-Capillary: 275 mg/dL — ABNORMAL HIGH (ref 65–99)
Glucose-Capillary: 284 mg/dL — ABNORMAL HIGH (ref 65–99)
Glucose-Capillary: 299 mg/dL — ABNORMAL HIGH (ref 65–99)

## 2015-02-04 LAB — MAGNESIUM
Magnesium: 2.2 mg/dL (ref 1.7–2.4)
Magnesium: 2.3 mg/dL (ref 1.7–2.4)

## 2015-02-04 LAB — LACTIC ACID, PLASMA
LACTIC ACID, VENOUS: 4.1 mmol/L — AB (ref 0.5–2.0)
LACTIC ACID, VENOUS: 2.7 mmol/L — AB (ref 0.5–2.0)
Lactic Acid, Venous: 1.6 mmol/L (ref 0.5–2.0)

## 2015-02-04 LAB — PROCALCITONIN: Procalcitonin: 0.46 ng/mL

## 2015-02-04 LAB — TROPONIN I
TROPONIN I: 0.07 ng/mL — AB (ref <0.031)
TROPONIN I: 0.13 ng/mL — AB (ref <0.031)

## 2015-02-04 LAB — PHOSPHORUS
Phosphorus: 4.1 mg/dL (ref 2.5–4.6)
Phosphorus: 4 mg/dL (ref 2.5–4.6)

## 2015-02-04 SURGERY — PACEMAKER IMPLANT

## 2015-02-04 MED ORDER — ONDANSETRON HCL 4 MG/2ML IJ SOLN
4.0000 mg | Freq: Four times a day (QID) | INTRAMUSCULAR | Status: DC | PRN
  Administered 2015-02-04: 4 mg via INTRAVENOUS
  Filled 2015-02-04: qty 2

## 2015-02-04 MED ORDER — ATROPINE SULFATE 0.1 MG/ML IJ SOLN
INTRAMUSCULAR | Status: AC
  Administered 2015-02-04: 1 mg
  Filled 2015-02-04: qty 10

## 2015-02-04 MED ORDER — DOPAMINE-DEXTROSE 3.2-5 MG/ML-% IV SOLN: 5.0000 ug/kg/min | INTRAVENOUS | Status: DC

## 2015-02-04 MED ORDER — MIDAZOLAM HCL 2 MG/2ML IJ SOLN
1.0000 mg | Freq: Once | INTRAMUSCULAR | Status: AC
  Administered 2015-02-04: 1 mg via INTRAVENOUS

## 2015-02-04 MED ORDER — ATROPINE SULFATE 0.1 MG/ML IJ SOLN
INTRAMUSCULAR | Status: AC
  Filled 2015-02-04: qty 10

## 2015-02-04 MED ORDER — FENTANYL CITRATE (PF) 100 MCG/2ML IJ SOLN
INTRAMUSCULAR | Status: AC
  Administered 2015-02-04: 100 ug
  Filled 2015-02-04: qty 4

## 2015-02-04 MED ORDER — CHLORHEXIDINE GLUCONATE 0.12 % MT SOLN
15.0000 mL | Freq: Two times a day (BID) | OROMUCOSAL | Status: DC
  Administered 2015-02-04 – 2015-02-05 (×3): 15 mL via OROMUCOSAL
  Filled 2015-02-04 (×3): qty 15

## 2015-02-04 MED ORDER — VANCOMYCIN HCL 500 MG IV SOLR
500.0000 mg | Freq: Once | INTRAVENOUS | Status: DC
  Filled 2015-02-04: qty 500

## 2015-02-04 MED ORDER — VANCOMYCIN HCL IN DEXTROSE 1-5 GM/200ML-% IV SOLN
1000.0000 mg | INTRAVENOUS | Status: DC
  Filled 2015-02-04: qty 200

## 2015-02-04 MED ORDER — SODIUM BICARBONATE 8.4 % IV SOLN
INTRAVENOUS | Status: AC
  Administered 2015-02-04: 100 meq
  Filled 2015-02-04: qty 50

## 2015-02-04 MED ORDER — FENTANYL CITRATE (PF) 100 MCG/2ML IJ SOLN
50.0000 ug | INTRAMUSCULAR | Status: DC | PRN
  Filled 2015-02-04: qty 2

## 2015-02-04 MED ORDER — FENTANYL CITRATE (PF) 100 MCG/2ML IJ SOLN
50.0000 ug | INTRAMUSCULAR | Status: AC | PRN
Start: 2015-02-04 — End: 2015-02-04
  Administered 2015-02-04 (×3): 50 ug via INTRAVENOUS

## 2015-02-04 MED ORDER — FENTANYL CITRATE (PF) 100 MCG/2ML IJ SOLN
50.0000 ug | Freq: Once | INTRAMUSCULAR | Status: AC
  Administered 2015-02-04: 50 ug via INTRAVENOUS

## 2015-02-04 MED ORDER — FENTANYL CITRATE (PF) 100 MCG/2ML IJ SOLN
INTRAMUSCULAR | Status: AC
  Administered 2015-02-04: 50 ug via INTRAVENOUS
  Filled 2015-02-04: qty 2

## 2015-02-04 MED ORDER — SODIUM CHLORIDE 0.9 % IV SOLN: INTRAVENOUS | Status: DC

## 2015-02-04 MED ORDER — FENTANYL CITRATE (PF) 2500 MCG/50ML IJ SOLN
25.0000 ug/h | INTRAMUSCULAR | Status: DC
  Administered 2015-02-04: 100 ug/h via INTRAVENOUS
  Filled 2015-02-04 (×3): qty 50

## 2015-02-04 MED ORDER — VANCOMYCIN HCL IN DEXTROSE 1-5 GM/200ML-% IV SOLN: 1000.0000 mg | INTRAVENOUS | Status: DC

## 2015-02-04 MED ORDER — NOREPINEPHRINE BITARTRATE 1 MG/ML IV SOLN
0.0000 ug/min | INTRAVENOUS | Status: DC
  Administered 2015-02-04: 2 ug/min via INTRAVENOUS
  Filled 2015-02-04: qty 4

## 2015-02-04 MED ORDER — CETYLPYRIDINIUM CHLORIDE 0.05 % MT LIQD
7.0000 mL | Freq: Four times a day (QID) | OROMUCOSAL | Status: DC
  Administered 2015-02-04 – 2015-02-05 (×5): 7 mL via OROMUCOSAL

## 2015-02-04 MED ORDER — CEFTRIAXONE SODIUM IN DEXTROSE 20 MG/ML IV SOLN
1.0000 g | INTRAVENOUS | Status: DC
  Administered 2015-02-04 – 2015-02-05 (×2): 1 g via INTRAVENOUS
  Filled 2015-02-04 (×3): qty 50

## 2015-02-04 MED ORDER — DOPAMINE-DEXTROSE 3.2-5 MG/ML-% IV SOLN
5.0000 ug/kg/min | INTRAVENOUS | Status: DC
Start: 2015-02-04 — End: 2015-02-06
  Administered 2015-02-04: 5 ug/kg/min via INTRAVENOUS
  Filled 2015-02-04: qty 250

## 2015-02-04 MED ORDER — MIDAZOLAM HCL 2 MG/2ML IJ SOLN
INTRAMUSCULAR | Status: AC
  Administered 2015-02-04: 2 mg
  Filled 2015-02-04: qty 4

## 2015-02-04 MED ORDER — CALCIUM CHLORIDE 10 % IV SOLN
1.0000 g | Freq: Once | INTRAVENOUS | Status: AC
  Filled 2015-02-04: qty 10

## 2015-02-04 MED ORDER — INSULIN ASPART 100 UNIT/ML ~~LOC~~ SOLN
0.0000 [IU] | SUBCUTANEOUS | Status: DC
  Administered 2015-02-04 – 2015-02-05 (×2): 3 [IU] via SUBCUTANEOUS
  Administered 2015-02-06: 4 [IU] via SUBCUTANEOUS
  Administered 2015-02-06: 3 [IU] via SUBCUTANEOUS
  Administered 2015-02-07 (×3): 4 [IU] via SUBCUTANEOUS

## 2015-02-04 MED ORDER — FENTANYL CITRATE (PF) 100 MCG/2ML IJ SOLN
100.0000 ug | Freq: Once | INTRAMUSCULAR | Status: AC
  Administered 2015-02-04: 100 ug via INTRAVENOUS

## 2015-02-04 MED ORDER — SODIUM BICARBONATE 8.4 % IV SOLN: 100.0000 meq | Freq: Once | INTRAVENOUS | Status: AC

## 2015-02-04 MED ORDER — INSULIN ASPART 100 UNIT/ML ~~LOC~~ SOLN
0.0000 [IU] | Freq: Three times a day (TID) | SUBCUTANEOUS | Status: DC
  Administered 2015-02-04: 7 [IU] via SUBCUTANEOUS
  Administered 2015-02-04: 4 [IU] via SUBCUTANEOUS

## 2015-02-04 MED ORDER — FENTANYL CITRATE (PF) 100 MCG/2ML IJ SOLN
INTRAMUSCULAR | Status: DC
Start: 2015-02-04 — End: 2015-02-04
  Filled 2015-02-04: qty 2

## 2015-02-04 MED ORDER — DEXTROSE 5 % IV SOLN
500.0000 mg | INTRAVENOUS | Status: DC
  Administered 2015-02-04 – 2015-02-05 (×2): 500 mg via INTRAVENOUS
  Filled 2015-02-04 (×3): qty 500

## 2015-02-04 MED ORDER — FENTANYL BOLUS VIA INFUSION
25.0000 ug | INTRAVENOUS | Status: DC | PRN
  Filled 2015-02-04: qty 25

## 2015-02-04 NOTE — Procedures (Signed)
Arterial Catheter Insertion Procedure Note Collin Pearson [id number] [birth year]  Procedure: Insertion of Arterial Catheter  Indications: Blood pressure monitoring and Frequent blood sampling  Procedure Details Consent: Unable to obtain consent because of emergent medical necessity. Time Out: Verified patient identification, verified procedure, site/side was marked, verified correct patient position, special equipment/implants available, medications/allergies/relevent history reviewed, required imaging and test results available.  Performed  Maximum sterile technique was used including antiseptics, cap, gloves, gown, hand hygiene, mask and sheet. Skin prep: Chlorhexidine; local anesthetic administered 20 gauge catheter was inserted into right femoral artery using the Seldinger technique.  Evaluation Blood flow good; BP tracing good. Complications: No apparent complications.   Collin Pearson. 02/04/2015   HR 30, shock [name]  Collin Pearson. Titus Mould, MD, [place] [facility]: [facility]

## 2015-02-04 NOTE — Progress Notes (Signed)
Pt. Vomited this morning moderate amount of light brownish vomitus, feeling weak, not feeling good and breath sounds wheezing bilaterally. Denies any chest pain maintaining O2 sats. >94% at 3liters. Dr. Titus Mould made aware with orders made. IVF decreased to [facility], zofran 4mg . IV given, stat blood gas and chest x-ray still pending. Will continue to monitor patient.

## 2015-02-04 NOTE — Progress Notes (Addendum)
CRITICAL VALUE ALERT  Critical value received:  Lactic acid 2.7  Date of notification:  02/04/15  Time of notification:  [id number]  Critical value read back:Yes.    Nurse who received alert:  Elsie Saas  MD notified (1st page):  Dr Titus Mould  Time of first page:  1402  MD notified (2nd page):  Time of second page:  Responding MD:  Dr Titus Mould  Time MD responded:  [phone]

## 2015-02-04 NOTE — Progress Notes (Signed)
  Echocardiogram 2D Echocardiogram has been performed.  Lysle Rubens 02/04/2015, 3:52 PM

## 2015-02-04 NOTE — Progress Notes (Signed)
Inpatient Diabetes Program Recommendations  AACE/ADA: New Consensus Statement on Inpatient Glycemic Control (2013)  Target Ranges:  Prepandial:   less than 140 mg/dL      Peak postprandial:   less than 180 mg/dL (1-2 hours)      Critically ill patients:  140 - 180 mg/dL  Results for ANTWONNE, SAELEE (MRN [id number]) as of 02/04/2015 10:37  Ref. Range 02/03/2015 19:00 02/03/2015 20:04 02/03/2015 23:51 02/04/2015 04:05 02/04/2015 07:40  Glucose-Capillary Latest Ref Range: 65-99 mg/dL 274 (H) 267 (H) 284 (H) 299 (H) 275 (H)   Inpatient Diabetes Program Recommendations Insulin - Basal: add Levemir 10 units (1/2 home dose) Thank you  Raoul Pitch BSN, RN,CDE Inpatient Diabetes Coordinator [phone] (team pager)

## 2015-02-04 NOTE — Procedures (Signed)
Intubation Procedure Note Collin Pearson [id number] 1927-04-18  Procedure: Intubation Indications: Respiratory insufficiency  Procedure Details Consent: Risks of procedure as well as the alternatives and risks of each were explained to the (patient/caregiver).  Consent for procedure obtained. Time Out: Verified patient identification, verified procedure, site/side was marked, verified correct patient position, special equipment/implants available, medications/allergies/relevent history reviewed, required imaging and test results available.  Performed  Maximum sterile technique was used including gloves, gown, hand hygiene and mask.  MAC and 3    Evaluation Hemodynamic Status: Persistent hypotension treated with pressors; O2 sats: stable throughout Patient's Current Condition: stable Complications: No apparent complications Patient did tolerate procedure well. Chest X-ray ordered to verify placement.  CXR: pending.   Collin Pearson 02/04/2015

## 2015-02-04 NOTE — Procedures (Signed)
Intubation Procedure Note Collin Pearson [id number] 1927-01-08  Procedure: Intubation Indications: Airway protection and maintenance  Procedure Details Consent: Risks of procedure as well as the alternatives and risks of each were explained to the (patient/caregiver).  Consent for procedure obtained. Time Out: Verified patient identification, verified procedure, site/side was marked, verified correct patient position, special equipment/implants available, medications/allergies/relevent history reviewed, required imaging and test results available.  Performed  Maximum sterile technique was used including cap, gloves, gown, hand hygiene and mask.  4    Evaluation Hemodynamic Status: BP stable throughout; O2 sats: stable throughout Patient's Current Condition: stable Complications: No apparent complications Patient did tolerate procedure well. Chest X-ray ordered to verify placement.  CXR: pending.   Leigh Aurora 02/04/2015

## 2015-02-04 NOTE — Progress Notes (Signed)
PULMONARY / CRITICAL CARE MEDICINE   Name: Collin Pearson MRN: [id number] DOB: 1927/04/26    ADMISSION DATE:  02/03/2015 CONSULTATION DATE:  02/04/2015  REFERRING MD :  [facility]  CHIEF COMPLAINT:  Bradycardia  INITIAL PRESENTATION:  81 y.o. M brought to [place] with pre-syncope, found to have bradycardia in 20's.  Started on isoproterenol and transferred to [facility] ICU.   STUDIES:   SIGNIFICANT EVENTS: 6/21 - admit.   HISTORY OF PRESENT ILLNESS:   Collin Pearson is a 81 y.o. M with PMH as outlined below including significant cardiac hx who was taken to [facility] ED 6/21 for pre-syncope.  He apparently felt as if he was about to pass out, but never did.  He remained conscious and was able to talk to his wife who called EMS.On EMS arrival, he was found to be bradycardic with HR in 20's.  He was started on TC pacing en route to [facility] ED.  Once at [facility], he was started on isoproterenol and transferred to [facility] for further evaluation and management.  Only significant labs at [place] were K of 5.8, BUN 75, SCr 3.5. After chart review, per Dr. Kennon Holter notes, pt has had hx of bradycardia.  He is on low dose beta blocker as well as amiodarone for PAF.  He is also on CCB for HTN.  SUBJECTIVE: nausea, vomit x 1, remain son isoproternal  VITAL SIGNS: Temp:  [98.1 F (36.7 C)-98.9 F (37.2 C)] 98.6 F (37 C) (06/22 0741) Pulse Rate:  [52-90] 87 (06/22 0800) Resp:  [16-26] 22 (06/22 0800) BP: (89-136)/(35-89) 132/44 mmHg (06/22 0800) SpO2:  [89 %-98 %] 97 % (06/22 0800) HEMODYNAMICS:   VENTILATOR SETTINGS:   INTAKE / OUTPUT: Intake/Output      06/21 0701 - 06/22 0700 06/22 0701 - 06/23 0700   I.V.  150   Other  100   Total Intake   250   Urine 450    Emesis/NG output 0    Total Output 450     Net -450 +250        Emesis Occurrence 100 x      PHYSICAL EXAMINATION: General: Elderly male, in NAD Neuro: A&O x 3, non-focal.  HEENT: [place]/AT. PERRL, sclerae  anicteric. Cardiovascular: RRR, soft systolic murmur 2/6, carotid bruit bilaterally worse on Left Lungs: Respirations even and unlabored.  Slight expiratory wheezes Abdomen: BS x 4, soft, NT/ND.  Musculoskeletal: No gross deformities, 3+ pitting edema bilateral LE's. Skin: Intact, warm, no rashes.  LABS:  CBC  Recent Labs Lab 02/03/15 2244 02/04/15 0436  WBC 9.9 11.9*  HGB 8.3* 8.2*  HCT 24.9* 24.9*  PLT 124* 143*   Coag's No results for input(s): APTT, INR in the last 168 hours. BMET  Recent Labs Lab 02/03/15 2244 02/04/15 0436  NA 134* 133*  K 4.0 4.0  CL 99* 99*  CO2 19* 20*  BUN 77* 73*  CREATININE 3.63* 3.70*  GLUCOSE 283* 302*   Electrolytes  Recent Labs Lab 02/03/15 2244 02/04/15 0436  CALCIUM 8.2* 8.2*  MG 2.2 2.2  PHOS 3.9 4.1   Sepsis Markers  Recent Labs Lab 02/03/15 2244 02/04/15 0436  LATICACIDVEN 3.7* 4.1*   ABG  Recent Labs Lab 02/04/15 0755  PHART 7.457*  PCO2ART 28.7*  PO2ART 75.0*   Liver Enzymes  Recent Labs Lab 02/03/15 2244  AST 103*  ALT 104*  ALKPHOS 72  BILITOT 0.7  ALBUMIN 2.7*   Cardiac Enzymes  Recent Labs Lab 02/03/15 2244 02/04/15  [id number]  TROPONINI 0.04* 0.07*   Glucose  Recent Labs Lab 02/03/15 1900 02/03/15 2004 02/03/15 2351 02/04/15 0405 02/04/15 0740  GLUCAP 274* 267* 284* 299* 275*    Imaging Dg Chest Port 1 View  02/04/2015   CLINICAL DATA:  Heart failure, pulmonary edema  EXAM: PORTABLE CHEST - 1 VIEW  COMPARISON:  02/04/2015  FINDINGS: Stable postop changes of coronary bypass, aortic valve replacement, and right carotid stent. Defibrillator pads overlie the left chest. Stable cardiomegaly without superimposed edema or CHF. Low lung volumes persist with streaky bibasilar atelectasis. No significant interval change. No developing or enlarging effusion. Negative for pneumothorax. Trachea is midline. Aortic atherosclerosis evident.  IMPRESSION: Stable postoperative findings  Stable  cardiomegaly with low lung volumes and bibasilar atelectasis.  Aortic atherosclerosis   Electronically Signed   By: Jerilynn Mages.  Shick M.D.   On: 02/04/2015 07:53   Dg Chest Port 1 View  02/04/2015   CLINICAL DATA:  Acute onset of hypoxia.  Initial encounter.  EXAM: PORTABLE CHEST - 1 VIEW  COMPARISON:  Chest radiograph performed 02/03/2015  FINDINGS: The lungs are hypoexpanded. Mild bibasilar opacities may reflect mild interstitial edema or pneumonia. No pleural effusion or pneumothorax is seen.  The cardiomediastinal silhouette is mildly enlarged. The patient is status post median sternotomy, with evidence of prior CABG. An aortic valve replacement is noted. No acute osseous abnormalities are identified. A vascular stent is noted along the right side of the neck.  IMPRESSION: Lungs hypoexpanded. Mild bibasilar opacities may reflect mild interstitial edema or pneumonia. Mild cardiomegaly.   Electronically Signed   By: Garald Balding M.D.   On: 02/04/2015 03:26     ASSESSMENT / PLAN:  PULMONARY A: Resolving edema rt P:   Pulmonary hygiene. pcxr repeated abg noted  CARDIOVASCULAR A:  Sinus bradycardia - with EMS as low as 20's, now improved on isoproterenol. Has not required pacing since arrival to [facility].  Could be due to decreased clearance of outpatient meds given his AoCKD. Troponin leak. Hx CAD, HTN, AS s/p AVR, PVD, PAF (not on anticoagulation). P:  Wean isoproterenol. Cardiology and EP following. Hold outpatient amiodarone, amlodipine, atorvastatin, furosemide, hydralazine, lopressor. Echo 6/22 Planning for pacemaker-possibly evening 6/22 k correction stat LA repeat Will wean isopr to off, if still sympto brady would add dopamine at 5 mics bata affect, can go to 8 if needed  RENAL A:   Acute on CKD. AG Metabolic acidosis Lactic acidosis (cardiogenic/) Pseudohypocalcemia - corrects to 9.24. hyponatremia P:   NS to kvo as there was concern pulm edema, less impressed after further  review pcxr  Follow BMET Trend Lactic Acid Repeat Lactic acid in evening MAp support, avoid sympto brady  GASTROINTESTINAL A:   GERD. Transaminitis - chronic, r/o some shock Nutrition. P:   Continue Pantoprazole. NPO  HEMATOLOGIC A:   Anemia. VTE Prophylaxis. P:  Transfuse for Hgb < 8 given cardiac hx. SCD's / Heparin. CBC in AM.  INFECTIOUS A:   No indication of infection, LA noted P:   Trend fever curve BCx 6/21 >>> Monitor clinically.  Assess pct  ENDOCRINE A:   DM.   TSH mildly elevated P:   Q4 hr accucheck while NPO SSI. Hold outpatient levemir, glipizide.   NEUROLOGIC A:   No acute issues. P:   No interventions required.  Family updated:   Interdisciplinary Family Meeting v Palliative Care Meeting:  Due by: 6/27.   02/04/2015, 9:12 AM  STAFF NOTE: I, Merrie Roof, MD FACP have personally reviewed  patient's available data, including medical history, events of note, physical examination and test results as part of my evaluation. I have discussed with resident/NP and other care providers such as pharmacist, RN and RRT. In addition, I personally evaluated patient and elicited key findings of: concern is LA from cardiogenic, isoprot is at a very high rate and we are running out of it, and will have risk running out before we can get further, will add dop to MAP goals, likley he will be asympto at lower HR, assess la follow up, pct assessment, for pacer likely , no pulm edema on pcxr and low O2 needs with LA noted, add fluids back, low threshold add ABX The patient is critically ill with multiple organ systems failure and requires high complexity decision making for assessment and support, frequent evaluation and titration of therapies, application of advanced monitoring technologies and extensive interpretation of multiple databases.   Critical Care Time devoted to patient care services described in this note is 30 Minutes. This time reflects time of  care of this signee: Merrie Roof, MD FACP. This critical care time does not reflect procedure time, or teaching time or supervisory time of PA/NP/Med student/Med Resident etc but could involve care discussion time. Rest per NP/medical resident whose note is outlined above and that I agree with   [name]. Titus Mould, MD, [place] Pgr: [place] [facility] 02/04/2015 10:10 AM

## 2015-02-04 NOTE — Procedures (Signed)
Central Venous Catheter Insertion Procedure Note Collin Pearson [id number] 1927-03-24  Procedure: Insertion of Central Venous Catheter Indications: Assessment of intravascular volume, Drug and/or fluid administration and Frequent blood sampling  Procedure Details Consent: Risks of procedure as well as the alternatives and risks of each were explained to the (patient/caregiver).  Consent for procedure obtained. Time Out: Verified patient identification, verified procedure, site/side was marked, verified correct patient position, special equipment/implants available, medications/allergies/relevent history reviewed, required imaging and test results available.  Performed  Maximum sterile technique was used including antiseptics, cap, gloves, gown, hand hygiene, mask and sheet. Skin prep: Chlorhexidine; local anesthetic administered A antimicrobial bonded/coated triple lumen catheter was placed in the right internal jugular vein using the Seldinger technique.  Evaluation Blood flow good Complications: No apparent complications Patient did tolerate procedure well. Chest X-ray ordered to verify placement.  CXR: pending.  Collin Pearson 02/04/2015, 1:27 PM  [name]  [name]. Titus Mould, MD, [place] Pgr: [place] [facility]

## 2015-02-04 NOTE — Progress Notes (Addendum)
ANTIBIOTIC CONSULT NOTE - INITIAL  Pharmacy Consult for vancomycin, ceftriaxone, azithromycin Indication: pneumonia  No Known Allergies  Patient Measurements: Weight: 170 lb 13.7 oz (77.5 kg) Vital Signs: Temp: 99 F (37.2 C) (06/22 1124) Temp Source: Oral (06/22 1124) BP: 124/44 mmHg (06/22 1215) Pulse Rate: 59 (06/22 1215) Intake/Output from previous day: 06/21 0701 - 06/22 0700 In: 1012.5 [I.V.:1012.5] Out: 450 [Urine:450] Intake/Output from this shift: Total I/O In: 505.7 [I.V.:505.7] Out: 200 [Urine:200]  Labs:  Recent Labs  02/03/15 2244 02/04/15 0436  WBC 9.9 11.9*  HGB 8.3* 8.2*  PLT 124* 143*  CREATININE 3.63* 3.70*   Estimated Creatinine Clearance: 13.9 mL/min (by C-G formula based on Cr of 3.7). No results for input(s): VANCOTROUGH, VANCOPEAK, VANCORANDOM, GENTTROUGH, GENTPEAK, GENTRANDOM, TOBRATROUGH, TOBRAPEAK, TOBRARND, AMIKACINPEAK, AMIKACINTROU, AMIKACIN in the last 72 hours.   Microbiology: Recent Results (from the past 720 hour(s))  MRSA PCR Screening     Status: None   Collection Time: 02/03/15  7:07 PM  Result Value Ref Range Status   MRSA by PCR NEGATIVE NEGATIVE Final    Comment:        The GeneXpert MRSA Assay (FDA approved for NASAL specimens only), is one component of a comprehensive MRSA colonization surveillance program. It is not intended to diagnose MRSA infection nor to guide or monitor treatment for MRSA infections.    Assessment: 81 year old male with concern for pneumonia due to decrease respiratory status, leukocytosis, and low grade fever to start vancomycin, ceftriaxone, and azithromycin for rule out CAP. Respiratory culture is pending. Patient received vancomycin last night at 2300 x1 gram.   Patient is currently in acute on chronic renal failure with SCr up to 3.70 and estimated CrCl ~10-15 mL/min.   QTc 575 on EKG 5/21- Discussed with Dr. Titus Mould: wants to continue atypical coverage and monitor QTc.   Goal of  Therapy:  Vancomycin trough level 15-20 mcg/ml  Clinical resolution of infection  Plan:  Ceftriaxone 1g IV every 24 hours. Azithromycin 500mg  IV every 24 hours.  Vancomycin 500mg  IV x1 now then 1g IV every 48 hours. Monitor QTc on Azithromycin. Monitor clinical status, culture results, and renal function.  Sloan Leiter, PharmD, BCPS Clinical Pharmacist [phone] 02/04/2015,12:29 PM

## 2015-02-04 NOTE — Consult Note (Signed)
ELECTROPHYSIOLOGY CONSULT NOTE    Patient ID: Collin Pearson MRN: [id number], DOB/AGE: 1926/11/19 81 y.o.  Admit date: 02/03/2015 Date of Consult: 02/04/2015  Primary Physician: Helen Hashimoto., MD Primary Cardiologist: Gwenlyn Found  Reason for Consultation: bradycardia and pre-syncope  HPI: Collin Pearson is a 81 y.o. male with a past medical history significant for CAD, carotid artery disease s/p bilateral CEA, diabetes, hypertension, paroxysmal atrial fibrillation, aortic stenosis s/p AVR, and chronic kidney disease.  On the day of admission to [facility], he was walking in from the parking lot and fell.  He does not recall dizziness or pre-syncope, he relates this as a mechanical fall, however, he told his wife that he felt as though he was going to pass out and EMS was called.  On their arrival, he was bradycardic with rates in the 20's and was briefly transcutaneously paced.  He was then placed on Isuprel and transferred to [facility] for further evaluation.  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  He reports that his heart rates at home are persistently in the 40's and that he has poor exercise intolerance.  He has been maintained on Amiodarone and Metoprolol, both of which are being held.    Last echo  06/2014 demonstrated EF [id number], grade 2 diastolic dysfunction, LA 46.   He currently denies chest pain, shortness of breath, dizziness.  He has had no recent fevers, chills, nausea or vomiting. He has not had frank syncope.  EP has been asked to evaluate for treatment options.   Past Medical History  Diagnosis Date  . Clotting disorder   . Coronary artery disease   . AS (aortic stenosis)     status post aortic valve replacement with an [employer] model 76 T. fracture bioprosthesis  . Anginal pain   . Hypertension   . Diabetes mellitus   . Peripheral vascular disease   . GERD (gastroesophageal reflux disease)   . CKD  (chronic kidney disease) stage 4, GFR 15-29 ml/min   . PVD (peripheral vascular disease), with hsx of bilateral carotid endarterectomies at [facility], and bilateral carotid stenting 05/18/2012  . Heart murmur   . PAF (paroxysmal atrial fibrillation), s/p cabg 06/18/2012  . Critical lower limb ischemia      Surgical History:  Past Surgical History  Procedure Laterality Date  . Endarterectomy    . Prostate surgery    . Cardiac stents    . Cardiac catheterization    . Coronary artery bypass graft  06/13/2012    Procedure: CORONARY ARTERY BYPASS GRAFTING (CABG);  Surgeon: Grace Isaac, MD;  Location: [place];  Service: Open Heart Surgery;  Laterality: N/A;  must start at 37 - appointment at 0800  . Aortic valve replacement  06/13/2012    Procedure: AORTIC VALVE REPLACEMENT (AVR);  Surgeon: Grace Isaac, MD;  Location: [place];  Service: Open Heart Surgery;  Laterality: N/A;  . Cardiac stress test  06/03/2010    Mild anterolateral ischemis  . Left and right heart catheterization with coronary angiogram N/A 05/18/2012    Procedure: LEFT AND RIGHT HEART CATHETERIZATION WITH CORONARY ANGIOGRAM;  Surgeon: Lorretta Harp, MD;  Location: [facility] CATH LAB;  Service: Cardiovascular;  Laterality: N/A;  . Carotid angiogram N/A 05/18/2012    Procedure: CAROTID ANGIOGRAM;  Surgeon: Lorretta Harp, MD;  Location: [facility] CATH LAB;  Service: Cardiovascular;  Laterality: N/A;  . Cardiac valve replacement  Prescriptions prior to admission  Medication Sig Dispense Refill Last Dose  . amiodarone (PACERONE) 200 MG tablet Take 1 tablet (200 mg total) by mouth daily. 90 tablet 3 02/03/2015 at Unknown time  . amLODipine (NORVASC) 10 MG tablet Take 1 tablet (10 mg total) by mouth daily. 90 tablet 2 02/03/2015 at Unknown time  . aspirin EC 81 MG tablet Take 81 mg by mouth 2 (two) times daily.   02/03/2015 at Unknown time  . atorvastatin (LIPITOR) 40 MG tablet TAKE 1 TABLET DAILY AT BEDTIME (Patient taking  differently: No sig reported) 90 tablet 2 02/02/2015 at Unknown time  . clotrimazole-betamethasone (LOTRISONE) cream Apply 1 application topically daily as needed (dry skin, itching).    Past Week at Unknown time  . docusate sodium (STOOL SOFTENER) 100 MG capsule Take 100 mg by mouth 3 (three) times a week.   02/02/2015 at Unknown time  . Epoetin Alfa (PROCRIT IJ) Inject 1 Dose as directed as needed.    02/02/2015 at Unknown time  . fish oil-omega-3 fatty acids 1000 MG capsule Take 1 g by mouth 3 (three) times daily.   02/03/2015 at Unknown time  . furosemide (LASIX) 80 MG tablet Take 80 mg by mouth. Takes 1 tablet in the morning and 1/2 tablet in the evening   02/03/2015 at Unknown time  . glipiZIDE (GLUCOTROL) 10 MG tablet Take 5-10 mg by mouth 2 (two) times daily before a meal. Takes 10 mg in the morning and 5 mg in the evening   02/03/2015 at Unknown time  . hydrALAZINE (APRESOLINE) 50 MG tablet Take 50 mg by mouth 3 (three) times daily.   02/03/2015 at Unknown time  . Iron TABS Take 160 mg by mouth 2 (two) times daily.    02/03/2015 at Unknown time  . LEVEMIR 100 UNIT/ML injection Inject 20 Units into the skin at bedtime.   02/02/2015 at Unknown time  . metoprolol tartrate (LOPRESSOR) 25 MG tablet TAKE ONE-HALF (1/2) TABLET (12.5 MG TOTAL) TWICE A DAY 90 tablet 3 02/03/2015 at 0830  . Multiple Vitamin (MULTIVITAMIN WITH MINERALS) TABS Take 1 tablet by mouth daily.   02/03/2015 at Unknown time  . ONE TOUCH ULTRA TEST test strip    02/03/2015 at Unknown time  . pantoprazole (PROTONIX) 40 MG tablet Take 40 mg by mouth daily.   02/03/2015 at Unknown time  . silver sulfADIAZINE (SILVADENE) 1 % cream Apply 1 application topically as needed (dry skin).    01/31/2015 at Unknown time  . sodium bicarbonate 650 MG tablet Take 650 mg by mouth daily as needed for heartburn.    Past Month at Unknown time  . solifenacin (VESICARE) 5 MG tablet Take 5 mg by mouth daily.    02/03/2015 at Unknown time  . Tamsulosin HCl (FLOMAX)  0.4 MG CAPS Take 0.4 mg by mouth. One tablet in the morning and 1/2 tablet in the evening   02/03/2015 at Unknown time  . vitamin E (VITAMIN E) 400 UNIT capsule Take 400 Units by mouth daily.   02/03/2015 at Unknown time  . BD INSULIN SYRINGE ULTRAFINE 31G X 5/16" 1 ML MISC        Inpatient Medications:  . atropine      . heparin  5,000 Units Subcutaneous 3 times per day  . insulin aspart  0-9 Units Subcutaneous 6 times per day  . pantoprazole (PROTONIX) IV  40 mg Intravenous Q24H    Allergies: No Known Allergies  History   Social History  .  Marital Status: Married    Spouse Name: N/A  . Number of Children: N/A  . Years of Education: N/A   Occupational History  . Not on file.   Social History Main Topics  . Smoking status: Never Smoker   . Smokeless tobacco: Never Used  . Alcohol Use: No  . Drug Use: No  . Sexual Activity: Not Currently   Other Topics Concern  . Not on file   Social History Narrative     Family History  Problem Relation Age of Onset  . Heart disease Mother 18  . Heart disease Father 15  . CAD Brother   . Heart disease Maternal Uncle   . Heart disease Paternal Uncle   . Heart disease Maternal Grandfather 2     Review of Systems: All other systems reviewed and are otherwise negative except as noted above.  Physical Exam: Filed Vitals:   02/04/15 0615 02/04/15 0700 02/04/15 0715 02/04/15 0741  BP: 89/35 127/38 133/89   Pulse: 77 64 86   Temp:    98.6 F (37 C)  TempSrc:    Oral  Resp: 21 26 21    SpO2: 95% 94% 98%     GEN- The patient is elderly and chronically ill appearing, alert and oriented x 3 today.   HEENT: normocephalic, atraumatic; sclera clear, conjunctiva pink; hearing intact; oropharynx clear; neck supple, no JVP Lymph- no cervical lymphadenopathy Lungs- Clear to ausculation bilaterally, normal work of breathing  Heart- Regular rate and rhythm, no murmurs, rubs or gallops  GI- soft, non-tender, non-distended, bowel sounds  present  Extremities- no clubbing, cyanosis, or edema  MS- no significant deformity or atrophy Skin- warm and dry, no rash or lesion, right EJ Psych- euthymic mood, full affect Neuro- strength and sensation are intact  Labs:   Lab Results  Component Value Date   WBC 11.9* 02/04/2015   HGB 8.2* 02/04/2015   HCT 24.9* 02/04/2015   MCV 94.3 02/04/2015   PLT 143* 02/04/2015    Recent Labs Lab 02/03/15 2244 02/04/15 0436  NA 134* 133*  K 4.0 4.0  CL 99* 99*  CO2 19* 20*  BUN 77* 73*  CREATININE 3.63* 3.70*  CALCIUM 8.2* 8.2*  PROT 5.2*  --   BILITOT 0.7  --   ALKPHOS 72  --   ALT 104*  --   AST 103*  --   GLUCOSE 283* 302*      Radiology/Studies: Dg Chest Port 1 View 02/04/2015   CLINICAL DATA:  Heart failure, pulmonary edema  EXAM: PORTABLE CHEST - 1 VIEW  COMPARISON:  02/04/2015  FINDINGS: Stable postop changes of coronary bypass, aortic valve replacement, and right carotid stent. Defibrillator pads overlie the left chest. Stable cardiomegaly without superimposed edema or CHF. Low lung volumes persist with streaky bibasilar atelectasis. No significant interval change. No developing or enlarging effusion. Negative for pneumothorax. Trachea is midline. Aortic atherosclerosis evident.  IMPRESSION: Stable postoperative findings  Stable cardiomegaly with low lung volumes and bibasilar atelectasis.  Aortic atherosclerosis   Electronically Signed   By: Jerilynn Mages.  Shick M.D.   On: 02/04/2015 07:53   [id number] rhythm, rate 85, RBBB, 1st degree AV block  TELEMETRY: sinus rhythm with rates in the 40's when Isuprel off  Assessment/Plan: 1.  Pre-syncope with trifascicular block The patient experienced a fall on the day of admission which he told his wife was associated with dizziness, but now he relates as a mechanical fall.  He is a poor historian and has baseline  conduction system disease with RBBB, LAFB, and 1st degree AV block.  He has been maintained on amiodarone and metoprolol for  paroxysmal atrial fibrillation that occurred post CABG/AVR.  With significant underlying conduction system disease and symptoms of pre-syncope and fatigue, pacemaker implantation is warranted. Risks, benefits discussed with the patient by Dr Lovena Le today.  Will plan to proceed as schedule allows. Wean Isuprel as able - HR of high 40-low 50's is acceptable as long as patient's blood pressure and mentation are stable. If unable to do PPM implant today, may need temp wire.   2.  Paroxysmal atrial fibrillation He had PAF post CABG and AVR in 2013.  I cannot find recurrence of atrial fibrillation Would stop amiodarone at this time and monitor If he has recurrence, will need to consider anticoagulation for CHADS2VASC score of at least 6  3.  CAD No recent ischemic symptoms  4.  Aortic stenosis s/p AVR Valve ok on echo 06/2014   Signed, Chanetta Marshall, NP 02/04/2015 8:26 AM  EP Attending  Patient seen and examined. The findings as documented above reflect my findings, history, physical exam, assessment and plan. Hopefully we can wean off his isuprel. Hold metoprolol and amio and avoid a PPM. May need a temp wire in the interim. He has multiple medical problems which will make his treatment more difficult.  Mikle Bosworth.D.

## 2015-02-04 NOTE — Progress Notes (Signed)
eLink Physician-Brief Progress Note Patient Name: [name] DOB: [birth year] MRN: [id number]   Date of Service  02/04/2015  HPI/Events of Note  Elevated lactate at 3.7  eICU Interventions  Will trend and recheck lactate with AM labs     Intervention Category Intermediate Interventions: Other:  DETERDING,ELIZABETH 02/04/2015, 12:08 AM

## 2015-02-04 NOTE — Consult Note (Addendum)
Referring Physician: No referring provider defined for this encounter. Primary Physician: Helen Hashimoto., MD Primary Cardiologist: Dr. Gwenlyn Found Reason for Consultation: Bradycardia   HPI: Patient is somewhat poor historian even though he appears to be alert and oriented, therefore history was mainly obtained from medical records and our CCM providers.  81 y.o. M with PMH coronary artery disease status post one-vessel CABG as well as stenting, status post aortic valve replacement with bypass pieces, chronic kidney disease, hypertension, diabetes mellitus, peripheral vascular disease who was taken to [facility] ED 6/21 for pre-syncope. He apparently felt as if he was about to pass out, but never did. He remained conscious and was able to talk to his wife who called EMS.  On EMS arrival, he was found to be bradycardic with HR in 20's. (no strips are valve, review ) He was started on TC pacing en route to [facility] ED. Once at [facility], he was started on isoproterenol and transferred to [facility] for further evaluation and management. Only significant labs at [place] were K of 5.8, BUN 75, SCr 3.5. According to Dr. Kennon Holter notes pt has had hx of bradycardia. He is on low dose beta blocker as well as amiodarone for PAF.   Patient denied active symptoms of chest pain, shortness of breath, syncope or presyncope, lower extremity edema, PND or orthopnea, frequent or prolonged palpitations  Review of Systems:  12 systems were reviewed nad were negative except mentioned in the HPI      Past Medical History  Diagnosis Date  . Clotting disorder   . Coronary artery disease   . AS (aortic stenosis)     status post aortic valve replacement with an [employer] model 66 T. fracture bioprosthesis  . End stage renal disease   . Anginal pain   . Hypertension   . Diabetes mellitus   . Peripheral vascular disease   . GERD (gastroesophageal reflux disease)   . CKD (chronic kidney disease)  stage 4, GFR 15-29 ml/min   . PVD (peripheral vascular disease), with hsx of bilateral carotid endarterectomies at [facility], and bilateral carotid stenting 05/18/2012  . Angina effort 05/18/2012  . Shortness of breath   . Heart murmur   . PAF (paroxysmal atrial fibrillation), s/p cabg 06/18/2012  . Critical lower limb ischemia    Past Surgical History  Procedure Laterality Date  . Endarterectomy    . Prostate surgery    . Cardiac stents    . Cardiac catheterization    . Coronary artery bypass graft  06/13/2012    Procedure: CORONARY ARTERY BYPASS GRAFTING (CABG);  Surgeon: Grace Isaac, MD;  Location: [place];  Service: Open Heart Surgery;  Laterality: N/A;  must start at 69 - appointment at 0800  . Aortic valve replacement  06/13/2012    Procedure: AORTIC VALVE REPLACEMENT (AVR);  Surgeon: Grace Isaac, MD;  Location: [place];  Service: Open Heart Surgery;  Laterality: N/A;  . Cardiac stress test  06/03/2010    Mild anterolateral ischemis  . Left and right heart catheterization with coronary angiogram N/A 05/18/2012    Procedure: LEFT AND RIGHT HEART CATHETERIZATION WITH CORONARY ANGIOGRAM;  Surgeon: Lorretta Harp, MD;  Location: [facility] CATH LAB;  Service: Cardiovascular;  Laterality: N/A;  . Carotid angiogram N/A 05/18/2012    Procedure: CAROTID ANGIOGRAM;  Surgeon: Lorretta Harp, MD;  Location: [facility] CATH LAB;  Service: Cardiovascular;  Laterality: N/A;     Current Medications: . heparin  5,000 Units Subcutaneous  3 times per day  . insulin aspart  0-9 Units Subcutaneous 6 times per day  . pantoprazole (PROTONIX) IV  40 mg Intravenous Q24H   Infusions:  . sodium chloride 100 mL/hr at 02/03/15 2115  . isoproterenol (ISUPREL) infusion 8 mcg/min (02/04/15 0039)    Prescriptions prior to admission  Medication Sig Dispense Refill Last Dose  . amiodarone (PACERONE) 200 MG tablet Take 1 tablet (200 mg total) by mouth daily. 90 tablet 3 02/03/2015 at Unknown time  . amLODipine  (NORVASC) 10 MG tablet Take 1 tablet (10 mg total) by mouth daily. 90 tablet 2 02/03/2015 at Unknown time  . aspirin EC 81 MG tablet Take 81 mg by mouth 2 (two) times daily.   02/03/2015 at Unknown time  . atorvastatin (LIPITOR) 40 MG tablet TAKE 1 TABLET DAILY AT BEDTIME (Patient taking differently: No sig reported) 90 tablet 2 02/02/2015 at Unknown time  . clotrimazole-betamethasone (LOTRISONE) cream Apply 1 application topically daily as needed (dry skin, itching).    Past Week at Unknown time  . docusate sodium (STOOL SOFTENER) 100 MG capsule Take 100 mg by mouth 3 (three) times a week.   02/02/2015 at Unknown time  . Epoetin Alfa (PROCRIT IJ) Inject 1 Dose as directed as needed.    02/02/2015 at Unknown time  . fish oil-omega-3 fatty acids 1000 MG capsule Take 1 g by mouth 3 (three) times daily.   02/03/2015 at Unknown time  . furosemide (LASIX) 80 MG tablet Take 80 mg by mouth. Takes 1 tablet in the morning and 1/2 tablet in the evening   02/03/2015 at Unknown time  . glipiZIDE (GLUCOTROL) 10 MG tablet Take 5-10 mg by mouth 2 (two) times daily before a meal. Takes 10 mg in the morning and 5 mg in the evening   02/03/2015 at Unknown time  . hydrALAZINE (APRESOLINE) 50 MG tablet Take 50 mg by mouth 3 (three) times daily.   02/03/2015 at Unknown time  . Iron TABS Take 160 mg by mouth 2 (two) times daily.    02/03/2015 at Unknown time  . LEVEMIR 100 UNIT/ML injection Inject 20 Units into the skin at bedtime.   02/02/2015 at Unknown time  . metoprolol tartrate (LOPRESSOR) 25 MG tablet TAKE ONE-HALF (1/2) TABLET (12.5 MG TOTAL) TWICE A DAY 90 tablet 3 02/03/2015 at 0830  . Multiple Vitamin (MULTIVITAMIN WITH MINERALS) TABS Take 1 tablet by mouth daily.   02/03/2015 at Unknown time  . ONE TOUCH ULTRA TEST test strip    02/03/2015 at Unknown time  . pantoprazole (PROTONIX) 40 MG tablet Take 40 mg by mouth daily.   02/03/2015 at Unknown time  . silver sulfADIAZINE (SILVADENE) 1 % cream Apply 1 application topically as  needed (dry skin).    01/31/2015 at Unknown time  . sodium bicarbonate 650 MG tablet Take 650 mg by mouth daily as needed for heartburn.    Past Month at Unknown time  . solifenacin (VESICARE) 5 MG tablet Take 5 mg by mouth daily.    02/03/2015 at Unknown time  . Tamsulosin HCl (FLOMAX) 0.4 MG CAPS Take 0.4 mg by mouth. One tablet in the morning and 1/2 tablet in the evening   02/03/2015 at Unknown time  . vitamin E (VITAMIN E) 400 UNIT capsule Take 400 Units by mouth daily.   02/03/2015 at Unknown time  . BD INSULIN SYRINGE ULTRAFINE 31G X 5/16" 1 ML MISC         No Known Allergies  History  Social History  . Marital Status: Married    Spouse Name: N/A  . Number of Children: N/A  . Years of Education: N/A   Occupational History  . Not on file.   Social History Main Topics  . Smoking status: Never Smoker   . Smokeless tobacco: Never Used  . Alcohol Use: No  . Drug Use: No  . Sexual Activity: Not Currently   Other Topics Concern  . Not on file   Social History Narrative    Family History  Problem Relation Age of Onset  . Heart disease Mother 31  . Heart disease Father 80  . CAD Brother   . Heart disease Maternal Uncle   . Heart disease Paternal Uncle   . Heart disease Maternal Grandfather 68   No family status information on file.    PHYSICAL EXAM: Filed Vitals:   02/03/15 2359  Temp: 98.9 F (37.2 C)    No intake or output data in the 24 hours ending 02/04/15 0140 At the time of my evaluation patient has heart rate of 60 bpm, SPO2 94% on 3 L of oxygen, blood pressure 130/60 mm Hg General:  Well appearing. No respiratory difficulty HEENT: normal Neck: supple. JVD 14 cm. No lymphadenopathy or thryomegaly appreciated. Cor: PMI nondisplaced. Regular rate & rhythm. No rubs, gallops or murmurs. Lungs: coarse breath sounds, wheezings over the right lower lung field. Abdomen: soft, nontender, nondistended. No hepatosplenomegaly. No bruits or masses. Good bowel  sounds. Extremities: no cyanosis, clubbing, rash, edema Neuro: alert & oriented x 3, cranial nerves grossly intact. moves all 4 extremities w/o difficulty. Affect pleasant.  Results for orders placed or performed during the hospital encounter of 02/03/15 (from the past 24 hour(s))  Glucose, capillary     Status: Abnormal   Collection Time: 02/03/15  7:00 PM  Result Value Ref Range   Glucose-Capillary 274 (H) 65 - 99 mg/dL  MRSA PCR Screening     Status: None   Collection Time: 02/03/15  7:07 PM  Result Value Ref Range   MRSA by PCR NEGATIVE NEGATIVE  Glucose, capillary     Status: Abnormal   Collection Time: 02/03/15  8:04 PM  Result Value Ref Range   Glucose-Capillary 267 (H) 65 - 99 mg/dL  CBC     Status: Abnormal   Collection Time: 02/03/15 10:44 PM  Result Value Ref Range   WBC 9.9 4.0 - 10.5 K/uL   RBC 2.58 (L) 4.22 - 5.81 MIL/uL   Hemoglobin 8.3 (L) 13.0 - 17.0 g/dL   HCT 24.9 (L) 39.0 - 52.0 %   MCV 96.5 78.0 - 100.0 fL   MCH 32.2 26.0 - 34.0 pg   MCHC 33.3 30.0 - 36.0 g/dL   RDW 18.0 (H) 11.5 - 15.5 %   Platelets 124 (L) 150 - 400 K/uL  Comprehensive metabolic panel     Status: Abnormal   Collection Time: 02/03/15 10:44 PM  Result Value Ref Range   Sodium 134 (L) 135 - 145 mmol/L   Potassium 4.0 3.5 - 5.1 mmol/L   Chloride 99 (L) 101 - 111 mmol/L   CO2 19 (L) 22 - 32 mmol/L   Glucose, Bld 283 (H) 65 - 99 mg/dL   BUN 77 (H) 6 - 20 mg/dL   Creatinine, Ser 3.63 (H) 0.61 - 1.24 mg/dL   Calcium 8.2 (L) 8.9 - 10.3 mg/dL   Total Protein 5.2 (L) 6.5 - 8.1 g/dL   Albumin 2.7 (L) 3.5 - 5.0 g/dL  AST 103 (H) 15 - 41 U/L   ALT 104 (H) 17 - 63 U/L   Alkaline Phosphatase 72 38 - 126 U/L   Total Bilirubin 0.7 0.3 - 1.2 mg/dL   GFR calc non Af Amer 14 (L) >60 mL/min   GFR calc Af Amer 16 (L) >60 mL/min   Anion gap 16 (H) 5 - 15  Magnesium     Status: None   Collection Time: 02/03/15 10:44 PM  Result Value Ref Range   Magnesium 2.2 1.7 - 2.4 mg/dL  Phosphorus     Status:  None   Collection Time: 02/03/15 10:44 PM  Result Value Ref Range   Phosphorus 3.9 2.5 - 4.6 mg/dL  Troponin I     Status: Abnormal   Collection Time: 02/03/15 10:44 PM  Result Value Ref Range   Troponin I 0.04 (H) <0.031 ng/mL  Lactic acid, plasma     Status: Abnormal   Collection Time: 02/03/15 10:44 PM  Result Value Ref Range   Lactic Acid, Venous 3.7 (HH) 0.5 - 2.0 mmol/L  TSH     Status: Abnormal   Collection Time: 02/03/15 10:44 PM  Result Value Ref Range   TSH 5.182 (H) 0.350 - 4.500 uIU/mL  Glucose, capillary     Status: Abnormal   Collection Time: 02/03/15 11:51 PM  Result Value Ref Range   Glucose-Capillary 284 (H) 65 - 99 mg/dL   Comment 1 Notify RN    Radiology:  No results found.  ECG: Likely atrial rhythm, there is what appears to be a tiny P-wave however morphology does not look sinus, rate 85 bpm, right bundle and left anterior fascicular block with very wide QRS at 192 bpm  ECHO: limited bedside echocardiogram overall normal LVEF, RV appears to be mildly to moderately dilated mild hypokinetic, patient appeared to have significant amount of tricuspid regurgitation however I was not able to assess that with portable echo machine, dilated IVC with no respiratory variation   ASSESSMENT: Symptomatic bradycardia  Hypoxemia Acute on chronic renal failure Hx of Coronary artery disease status post CABG 1 Hx of Aortic stenosis status post bio AVR  DISCUSSION:  Patient has known degeneration of his conduction system with previous bifascicular block unfortunately there are no strips available from EMS it is not inconceivable that he has an episode of complete heart block. Currently his rate is maintained on 5 g of isoproterenol drip. He has what appears to be atrial rhythm ???.  his bradycardia arrhythmias likely result of underlying conduction is, metoprolol, amiodarone, hyperkalemia.   was concerning to me is new hypoxia, patient denied smoking, history of COPD or  asthma, he does have a lobar wheezing on physical exam. Unfortunately there is no chest x-ray in the system. He also has dilated RV with some degree of tricuspid regurgitation that was not noted on the echocardiogram in 2015.  Recommendations: - Continue Isuprel, continue to hold metoprolol and amiodarone  - Please obtain free T4  - Patient will need to be evaluated by electrophysiology tomorrow, keep him nothing by mouth after midnight  - would recommend obtaining chest x-ray if no apparent infiltrates would proceed with further workup and treatment for primary embolism  - repeat cardiac enzymes  - ECHO  Inez Pilgrim, MD 02/04/2015 1:40 AM

## 2015-02-05 ENCOUNTER — Inpatient Hospital Stay (HOSPITAL_COMMUNITY): Payer: PRIVATE HEALTH INSURANCE

## 2015-02-05 LAB — CBC
HCT: 30.7 % — ABNORMAL LOW (ref 39.0–52.0)
Hemoglobin: 10.1 g/dL — ABNORMAL LOW (ref 13.0–17.0)
MCH: 31.1 pg (ref 26.0–34.0)
MCHC: 32.9 g/dL (ref 30.0–36.0)
MCV: 94.5 fL (ref 78.0–100.0)
Platelets: 192 10*3/uL (ref 150–400)
RBC: 3.25 MIL/uL — AB (ref 4.22–5.81)
RDW: 17.9 % — AB (ref 11.5–15.5)
WBC: 14.1 10*3/uL — AB (ref 4.0–10.5)

## 2015-02-05 LAB — MAGNESIUM: Magnesium: 2.2 mg/dL (ref 1.7–2.4)

## 2015-02-05 LAB — PHOSPHORUS: Phosphorus: 2.9 mg/dL (ref 2.5–4.6)

## 2015-02-05 LAB — POCT I-STAT 3, ART BLOOD GAS (G3+)
Acid-Base Excess: 2 mmol/L (ref 0.0–2.0)
Bicarbonate: 26.1 mEq/L — ABNORMAL HIGH (ref 20.0–24.0)
O2 Saturation: 97 %
Patient temperature: 98.9
TCO2: 27 mmol/L (ref 0–100)
pCO2 arterial: 36.8 mmHg (ref 35.0–45.0)
pH, Arterial: 7.46 — ABNORMAL HIGH (ref 7.350–7.450)
pO2, Arterial: 81 mmHg (ref 80.0–100.0)

## 2015-02-05 LAB — GLUCOSE, CAPILLARY
GLUCOSE-CAPILLARY: 117 mg/dL — AB (ref 65–99)
GLUCOSE-CAPILLARY: 74 mg/dL (ref 65–99)
GLUCOSE-CAPILLARY: 79 mg/dL (ref 65–99)
GLUCOSE-CAPILLARY: 87 mg/dL (ref 65–99)
Glucose-Capillary: 147 mg/dL — ABNORMAL HIGH (ref 65–99)
Glucose-Capillary: 157 mg/dL — ABNORMAL HIGH (ref 65–99)
Glucose-Capillary: 51 mg/dL — ABNORMAL LOW (ref 65–99)

## 2015-02-05 LAB — BASIC METABOLIC PANEL
Anion gap: 11 (ref 5–15)
BUN: 64 mg/dL — AB (ref 6–20)
CO2: 24 mmol/L (ref 22–32)
Calcium: 8.9 mg/dL (ref 8.9–10.3)
Chloride: 101 mmol/L (ref 101–111)
Creatinine, Ser: 3.41 mg/dL — ABNORMAL HIGH (ref 0.61–1.24)
GFR calc Af Amer: 17 mL/min — ABNORMAL LOW (ref >60)
GFR calc non Af Amer: 15 mL/min — ABNORMAL LOW (ref >60)
Glucose, Bld: 72 mg/dL (ref 65–99)
POTASSIUM: 3.9 mmol/L (ref 3.5–5.1)
Sodium: 136 mmol/L (ref 135–145)

## 2015-02-05 LAB — LACTIC ACID, PLASMA: Lactic Acid, Venous: 0.7 mmol/L (ref 0.5–2.0)

## 2015-02-05 LAB — CALCIUM, IONIZED: Calcium, Ionized, Serum: 4.3 mg/dL — ABNORMAL LOW (ref 4.5–5.6)

## 2015-02-05 LAB — PROCALCITONIN: PROCALCITONIN: 0.5 ng/mL

## 2015-02-05 MED ORDER — DEXTROSE-NACL 5-0.45 % IV SOLN
INTRAVENOUS | Status: DC
  Administered 2015-02-05: 02:00:00 via INTRAVENOUS

## 2015-02-05 MED ORDER — DEXTROSE 50 % IV SOLN
INTRAVENOUS | Status: AC
  Administered 2015-02-05: 50 mL
  Filled 2015-02-05: qty 50

## 2015-02-05 NOTE — Progress Notes (Signed)
eLink Physician-Brief Progress Note Patient Name: BIENVENIDO NOVOSEL DOB: [birth year] MRN: [id number]  Date of Service  02/05/2015   HPI/Events of Note   1. Hypoglycemia per RN 2. Asked to check K by bedside MD              Recent Labs Lab 02/03/15 2244 02/04/15 0436 02/04/15 1229  K 4.0 4.0 4.4   3. Asked to check cafrds opinuion by bedsid MD   eICU Interventions  Start d5half normal saline K is ok Dr Lovena Le of cards says "Hopefully we can wean off his isuprel. Hold metoprolol and amio and avoid a PPM. May need a temp wire in the interim. He has multiple medical problems which will make his treatment more difficult."   Intervention Category Intermediate Interventions: Other:  Tiffiney Sparrow 02/05/2015, 1:06 AM

## 2015-02-05 NOTE — Progress Notes (Signed)
Pt failed wean attempt at this time due to decreased respiratory effort

## 2015-02-05 NOTE — Significant Event (Signed)
Patient kept slidding down in the bed-about to fall off of the bed at numerous times. Staff and family remain in room with patient, verbally reminding and educating patient to stay in bed. Patient is confused. Actively trying to get out of bed, incurred multiple skin tears on right arm and legs from hitting against the side rails when he scooted down in bed.  Spoke with Dr. Vernelle Emerald gave verbal order for soft waist restraint. MD in procedure. RN to put in order. Family updated and remain at bedside. Tanis Hensarling, Therapist, sports.

## 2015-02-05 NOTE — Progress Notes (Signed)
Pt placed on C/PS 5/5 per MD, pt awake and tolerating well, per MD will re-eval pt X1hr for extubation today, rt will monitor

## 2015-02-05 NOTE — Significant Event (Signed)
Returned a full, intact bag of fentanyl drip to 2nd floor pharmacy that was delivered this morning that have not been used. Hand-delivered by this RN to pharmacist Erline Levine.

## 2015-02-05 NOTE — Progress Notes (Addendum)
Inpatient Diabetes Program Recommendations  AACE/ADA: New Consensus Statement on Inpatient Glycemic Control (2013)  Target Ranges:  Prepandial:   less than 140 mg/dL      Peak postprandial:   less than 180 mg/dL (1-2 hours)      Critically ill patients:  140 - 180 mg/dL   Inpatient Diabetes Program Recommendations Insulin - Basal: . Correction (SSI): change to sensitive scale TID  Thank you  Raoul Pitch BSN, RN,CDE Inpatient Diabetes Coordinator [phone] (team pager)

## 2015-02-05 NOTE — Progress Notes (Signed)
PULMONARY / CRITICAL CARE MEDICINE   Name: Collin Pearson MRN: [id number] DOB: [birth year]    ADMISSION DATE:  02/03/2015 CONSULTATION DATE:  02/05/2015  REFERRING MD :  [facility]  CHIEF COMPLAINT:  Bradycardia  INITIAL PRESENTATION:  81 y.o. M brought to [place] with pre-syncope, found to have bradycardia in 20's.  Started on isoproterenol and transferred to [facility] ICU.   STUDIES:  6/22 Echo: Left ventricle: The cavity size was normal. Wall thickness wasincreased in a pattern of mild LVH. Systolic function was normal.The estimated ejection fraction was in the range of 60% to 65%.Wall motion was normal; there were no regional wall motionabnormalities.Aortic valve: Valve area (VTI): 3.54 cm^2. Valve area (Vmax):3.81 cm^2. Valve area (Vmean): 3.47 cm^2.Mitral valve: There was mild regurgitation. Left atrium: The atrium was mildly dilated.Atrial septum: No defect or patent foramen ovale was identified.  SIGNIFICANT EVENTS: 6/21 - admit. 6/22: Bradycardia with hemodynamic instability, Intubation d/t airway protection, Aline and CVL placed, levophed and dopamine started. Isoproter. stopped   SUBJECTIVE: dop , HTN , ett remains  VITAL SIGNS: Temp:  [98.1 F (36.7 C)-99.2 F (37.3 C)] 99 F (37.2 C) (06/23 0734) Pulse Rate:  [49-87] 73 (06/23 0700) Resp:  [14-27] 20 (06/23 0700) BP: (104-169)/(33-76) 167/59 mmHg (06/23 0700) SpO2:  [78 %-100 %] 98 % (06/23 0700) Arterial Line BP: (143-219)/(52-99) 197/60 mmHg (06/23 0700) FiO2 (%):  [60 %-100 %] 60 % (06/23 0700) Weight:  [77.5 kg (170 lb 13.7 oz)] 77.5 kg (170 lb 13.7 oz) (06/23 0500) HEMODYNAMICS:   VENTILATOR SETTINGS: Vent Mode:  [-] PRVC FiO2 (%):  [60 %-100 %] 60 % Set Rate:  [20 bmp] 20 bmp Vt Set:  [500 mL] 500 mL PEEP:  [5 cmH20] 5 cmH20 Plateau Pressure:  [14 cmH20-22 cmH20] 17 cmH20 INTAKE / OUTPUT: Intake/Output      06/22 0701 - 06/23 0700 06/23 0701 - 06/24 0700   I.V. (mL/kg) 1903 (24.6)    NG/GT 30    IV  Piggyback 300    Total Intake(mL/kg) 2233 (28.8)    Urine (mL/kg/hr) 2045 (1.1)    Emesis/NG output     Total Output 2045     Net +188            PHYSICAL EXAMINATION: General: Elderly male, intubated lying in bed in NAD Neuro: Sedated, moves all extremities HEENT: [place]/AT. PERRL, sclerae anicteric. Cardiovascular: RRR, soft systolic murmur 2/6, carotid bruit bilaterally worse on Left Lungs: Respirations even. CTA anteriorly Abdomen: BS x 4, soft, NT/ND.  Musculoskeletal: No gross deformities, 3+ pitting edema bilateral LE's. Skin: Intact, warm, no rashes.  LABS:  CBC  Recent Labs Lab 02/03/15 2244 02/04/15 0436 02/05/15 0524  WBC 9.9 11.9* 14.1*  HGB 8.3* 8.2* 10.1*  HCT 24.9* 24.9* 30.7*  PLT 124* 143* 192   Coag's No results for input(s): APTT, INR in the last 168 hours. BMET  Recent Labs Lab 02/03/15 2244 02/04/15 0436 02/04/15 1229  NA 134* 133* 132*  K 4.0 4.0 4.4  CL 99* 99* 98*  CO2 19* 20* 21*  BUN 77* 73* 72*  CREATININE 3.63* 3.70* 3.82*  GLUCOSE 283* 302* 191*   Electrolytes  Recent Labs Lab 02/03/15 2244 02/04/15 0436 02/04/15 1229 02/05/15 0524  CALCIUM 8.2* 8.2* 8.4*  --   MG 2.2 2.2 2.3 2.2  PHOS 3.9 4.1 4.0 2.9   Sepsis Markers  Recent Labs Lab 02/04/15 1229 02/04/15 1715 02/05/15 0503 02/05/15 0524  LATICACIDVEN 2.7* 1.6 0.7  --  PROCALCITON 0.46  --   --  0.50   ABG  Recent Labs Lab 02/04/15 0755 02/04/15 1257 02/05/15 0441  PHART 7.457* 7.397 7.460*  PCO2ART 28.7* 32.5* 36.8  PO2ART 75.0* 110.0* 81.0   Liver Enzymes  Recent Labs Lab 02/03/15 2244  AST 103*  ALT 104*  ALKPHOS 72  BILITOT 0.7  ALBUMIN 2.7*   Cardiac Enzymes  Recent Labs Lab 02/03/15 2244 02/04/15 0436 02/04/15 1229  TROPONINI 0.04* 0.07* 0.13*   Glucose  Recent Labs Lab 02/04/15 1254 02/04/15 1607 02/04/15 2022 02/05/15 0010 02/05/15 0055 02/05/15 0351  GLUCAP 162* 161* 125* 51* 117* 74    Imaging Dg Chest Port 1  View  02/05/2015   CLINICAL DATA:  Intubation.  EXAM: PORTABLE CHEST - 1 VIEW  COMPARISON:  02/04/2015.  FINDINGS: Endotracheal tube, left IJ line, NG tube in stable position. Right carotid stent again noted in stable position. Mediastinum and hilar structures are normal. Prior cardiac valve replacement. Stable cardiomegaly. Persistent low lung volumes with bibasilar atelectasis and/or infiltrates. Small pleural effusions. No pneumothorax.  IMPRESSION: 1. Lines and tubes in stable position. Right carotid stent in stable position. 2. Persistent bibasilar atelectasis and/or infiltrates. Small pleural effusions. 3. Prior cardiac valve replacement.  Stable cardiomegaly.   Electronically Signed   By: Marcello Moores  Register   On: 02/05/2015 07:32   Dg Chest Port 1 View  02/04/2015   CLINICAL DATA:  Hypoxia  EXAM: PORTABLE CHEST - 1 VIEW  COMPARISON:  Study obtained earlier in the day  FINDINGS: Endotracheal tube tip is 1.8 cm above the carina. Central catheter tip is at the junction of the left innominate vein and superior vena cava. No pneumothorax. There is consolidation in the left lower lobe. There is mild atelectasis in the right mid lung. Lungs elsewhere are clear. Heart is upper normal in size with pulmonary vascular within normal limits. The patient has an aortic valve replacement. The patient is also status post coronary artery bypass grafting. There is a right carotid region stent.  IMPRESSION: Tube and catheter positions as described without pneumothorax. Increased opacity consistent consolidation left lower lobe. Stable atelectasis right midlung. No change in cardiac silhouette.   Electronically Signed   By: Lowella Grip III M.D.   On: 02/04/2015 14:11   Dg Chest Port 1 View  02/04/2015   CLINICAL DATA:  Heart failure, pulmonary edema  EXAM: PORTABLE CHEST - 1 VIEW  COMPARISON:  02/04/2015  FINDINGS: Stable postop changes of coronary bypass, aortic valve replacement, and right carotid stent.  Defibrillator pads overlie the left chest. Stable cardiomegaly without superimposed edema or CHF. Low lung volumes persist with streaky bibasilar atelectasis. No significant interval change. No developing or enlarging effusion. Negative for pneumothorax. Trachea is midline. Aortic atherosclerosis evident.  IMPRESSION: Stable postoperative findings  Stable cardiomegaly with low lung volumes and bibasilar atelectasis.  Aortic atherosclerosis   Electronically Signed   By: Jerilynn Mages.  Shick M.D.   On: 02/04/2015 07:53   Dg Abd Portable 1v  02/04/2015   CLINICAL DATA:  Check nasogastric catheter placement  EXAM: PORTABLE ABDOMEN - 1 VIEW  COMPARISON:  None.  FINDINGS: Scattered large and small bowel gas is noted. A lamellated gallstone is noted in the right upper quadrant. A nasogastric catheter is seen within the mid stomach. No other focal abnormality is noted.  IMPRESSION: Nasogastric catheter within the mid stomach.   Electronically Signed   By: Inez Catalina M.D.   On: 02/04/2015 14:58     ASSESSMENT /  PLAN:  PULMONARY OETT>>> 6/22 A: Intubated for airway protection Resolving edema rt P:   pcxr for fluid in fissure, edema noted today VAP protocol ABG reviewed, reduce rate, may need TV reduction Wean cpap 5 ps 5 Maintain sats >92% Consider neg balance as able  CARDIOVASCULAR A:  Sinus bradycardia - with EMS as low as 20's, now improved on isoproterenol. Has not required pacing since arrival to [facility].  Could be due to decreased clearance of outpatient meds given his AoCKD. Troponin leak,  Hypotension>>>Now hypertensive Hx CAD, HTN, AS s/p AVR, PVD, PAF (not on anticoagulation). P:  Dopamine 5 mics beta affect, can go to 8 if needed and levophed initated 6/22 - wean this to off now, dc this, then evaluate brady reoccurence Cardiology and EP following. Hold outpatient amiodarone, amlodipine, atorvastatin, hydralazine, lopressor. Planned pacemaker likely, the insult that started this brady was mild k,  and home meds If remains HTN after dop , then start hydralazine  RENAL A:   Acute on CKD, ATN AG Metabolic acidosis- resolved Lactic acidosis>>>resolved Hyponatremia resolved P:   NS to kvo  Follow BMET MAp support, avoid sympto brady  GASTROINTESTINAL A:   GERD. Transaminitis - chronic, r/o some shock Nutrition. P:   Continue Pantoprazole. NPO Place OG Start enteral nutrition if not extubated lft in am   HEMATOLOGIC A:   Anemia. VTE Prophylaxis. P:  Transfuse for Hgb < 8 given cardiac hx. SCD's / Heparin. Follow CBC  INFECTIOUS A:   R/o infection, ( unimpressed) P:   Trend fever curve BCx 6/21 >>>not resulted 6/23 AM Monitor clinically.  vanc 6/22>>>6/23 azithro 6/22>> ceftriaxone 6/22>>>  Echo no veg Assess pct, helpful, dc all abx in am if remains culture neg and clinical progress  ENDOCRINE A:   DM.   TSH mildly elevated P:   Q4 hr accucheck while NPO SSI. Hold outpatient levemir, glipizide.  NEUROLOGIC A:   Acute encephalopathy- sepsis vs poor cerebral perfusion during bradycardia P:   -continue to monitor neuro status -PAD protocol -WUA  Family updated: Family at bedside 6/22. Updated on plan of care. Consent obtained for intubation and line placement.  Interdisciplinary Family Meeting v Palliative Care Meeting:  Due by: 6/27.   02/05/2015 7:49 AM  STAFF NOTE: Linwood Dibbles, MD FACP have personally reviewed patient's available data, including medical history, events of note, physical examination and test results as part of my evaluation. I have discussed with resident/NP and other care providers such as pharmacist, RN and RRT. In addition, I personally evaluated patient and elicited key findings of: LA resolved, acidosis resolved, pcxr with some edema, HTn now on dop 5, dc dop now, assess response off beta agonists, ABG reviewed, reduce MV, wean today, goal is extubation, EP following likley to need a pacer at some stage, KVO, no  lasix as of now, resolving atn, ABX noted, dc vanc, in am dc all abx likely, WUA!! The patient is critically ill with multiple organ systems failure and requires high complexity decision making for assessment and support, frequent evaluation and titration of therapies, application of advanced monitoring technologies and extensive interpretation of multiple databases.   Critical Care Time devoted to patient care services described in this note is35 Minutes. This time reflects time of care of this signee: Merrie Roof, MD FACP. This critical care time does not reflect procedure time, or teaching time or supervisory time of PA/NP/Med student/Med Resident etc but could involve care discussion time. Rest per NP/medical resident whose note is  outlined above and that I agree with   [name]. Titus Mould, MD, [place] Pgr: [facility] 02/05/2015 8:32 AM

## 2015-02-05 NOTE — Progress Notes (Signed)
SUBJECTIVE: The patient is intubated and sedated.  He presented with symptomatic bradycardia to [facility] and required transcutaenous pacing.  He was then placed on Isuprel and transferred to [facility].  His Isuprel was weaned yesterday with return of bradycardia and O2 desaturations.  He was intubated and supported with dopamine and Levophed.  His Levophed has been weaned off and his dopamine is currently being weaned.  Heart rates have been stable overnight.   CURRENT MEDICATIONS: . antiseptic oral rinse  7 mL Mouth Rinse QID  . azithromycin  500 mg Intravenous Q24H  . cefTRIAXone (ROCEPHIN)  IV  1 g Intravenous Q24H  . chlorhexidine  15 mL Mouth Rinse BID  . heparin  5,000 Units Subcutaneous 3 times per day  . insulin aspart  0-20 Units Subcutaneous 6 times per day  . pantoprazole (PROTONIX) IV  40 mg Intravenous Q24H   . sodium chloride Stopped (02/04/15 1201)  . dextrose 5 % and 0.45% NaCl 10 mL/hr at 02/05/15 0139  . DOPamine 5 mcg/kg/min (02/04/15 1300)  . fentaNYL infusion INTRAVENOUS 300 mcg/hr (02/05/15 0439)  . isoproterenol (ISUPREL) infusion Stopped (02/04/15 1210)  . norepinephrine (LEVOPHED) Adult infusion Stopped (02/04/15 1300)   Temp (24hrs), Avg:98.9 F (37.2 C), Min:98.1 F (36.7 C), Max:99.2 F (37.3 C)    OBJECTIVE: Physical Exam: Filed Vitals:   02/05/15 0600 02/05/15 0700 02/05/15 0734 02/05/15 0751  BP: 169/58 167/59  186/58  Pulse: 73 73  72  Temp:   99 F (37.2 C)   TempSrc:   Oral   Resp: 20 20  21   Weight:      SpO2: 98% 98%  99%    Intake/Output Summary (Last 24 hours) at 02/05/15 0845 Last data filed at 02/05/15 0700  Gross per 24 hour  Intake 2045.49 ml  Output   1945 ml  Net 100.49 ml    Telemetry reveals sinus rhythm  GEN- The patient is intubated and sedated   Head- normocephalic, atraumatic Eyes-  Sclera clear, conjunctiva pink Ears- hearing intact Oropharynx- ET tube Neck- supple, no JVP Lungs- Clear to ausculation  bilaterally, normal work of breathing Heart- Regular rate and rhythm, no murmurs, rubs or gallops  GI- soft, NT, ND, + BS Extremities- no clubbing, cyanosis, +edema Skin- no rash or lesion  LABS: Basic Metabolic Panel:  Recent Labs  02/04/15 1229 02/05/15 0524  NA 132* 136  K 4.4 3.9  CL 98* 101  CO2 21* 24  GLUCOSE 191* 72  BUN 72* 64*  CREATININE 3.82* 3.41*  CALCIUM 8.4* 8.9  MG 2.3 2.2  PHOS 4.0 2.9   Liver Function Tests:  Recent Labs  02/03/15 2244  AST 103*  ALT 104*  ALKPHOS 72  BILITOT 0.7  PROT 5.2*  ALBUMIN 2.7*   CBC:  Recent Labs  02/04/15 0436 02/05/15 0524  WBC 11.9* 14.1*  HGB 8.2* 10.1*  HCT 24.9* 30.7*  MCV 94.3 94.5  PLT 143* 192   Cardiac Enzymes:  Recent Labs  02/03/15 2244 02/04/15 0436 02/04/15 1229  TROPONINI 0.04* 0.07* 0.13*   Thyroid Function Tests:  Recent Labs  02/03/15 2244  TSH 5.182*    RADIOLOGY: Dg Chest Port 1 View 02/04/2015   CLINICAL DATA:  Acute onset of hypoxia.  Initial encounter.  EXAM: PORTABLE CHEST - 1 VIEW  COMPARISON:  Chest radiograph performed 02/03/2015  FINDINGS: The lungs are hypoexpanded. Mild bibasilar opacities may reflect mild interstitial edema or pneumonia. No pleural effusion or pneumothorax is seen.  The cardiomediastinal silhouette is mildly enlarged. The patient is status post median sternotomy, with evidence of prior CABG. An aortic valve replacement is noted. No acute osseous abnormalities are identified. A vascular stent is noted along the right side of the neck.  IMPRESSION: Lungs hypoexpanded. Mild bibasilar opacities may reflect mild interstitial edema or pneumonia. Mild cardiomegaly.   Electronically Signed   By: Garald Balding M.D.   On: 02/04/2015 03:26    ASSESSMENT AND PLAN:  Active Problems:   Bradycardia   Encounter for nasogastric tube placement   Endotracheally intubated   Hypoxia   Pulmonary edema  1.  Sinus bradycardia The patient presented with symptomatic sinus  bradycardia in the setting of underlying conduction system disease and pre-syncope.  His amiodarone and metoprolol have been held and he has required support with isuprel and now dopamine to maintain heart rate.  Yesterday, he reported persistent exercise intolerance and fatigue.   Discussed with Dr Titus Mould this morning - he plans on trying to extubate today and weaning dopamine.   Dr Caryl Comes to see today.  2.  Atrial fibrillation Identified immediately post op CABG 2013 He has had no documented atrial fibrillation since Would not resume amiodarone unless he has recurrent documented AF If he has recurrent AF, will need to consider [place] with CHADS2VASC of at least 6  3.  CAD No recent ischemic symptoms Will discuss with Dr Gwenlyn Found who knows him well need for long-term beta blocker therapy for CAD as this may impact decision about permanent pacemaker  4.  Aortic stenosis s/p AVR Echocardiogram yesterday with normal valve function  Chanetta Marshall, NP 02/05/2015 8:52 AM  Neurologically rigid  What is this  Bradycardia here is sinus node dysfunction with junctional bradycardia  May be related to [facility] which has been stopped and ? Low dose metoprolol  Will allow current issues to resolve, respiratory etc and see what happens to rate with drug wash out   Will follow with you and decide as we go as to timing of pacing if indicated

## 2015-02-05 NOTE — Progress Notes (Signed)
Hypoglycemic Event  CBG: 51  Treatment: D50 IV 50 mL  Symptoms: Sweaty  Follow-up CBG: Time:0055 CBG Result:117  Possible Reasons for Event: Other: Pt NPO  Comments/MD notified:Dr. Casimer Lanius  Remember to initiate Hypoglycemia Order Set & complete

## 2015-02-05 NOTE — Significant Event (Signed)
Wasted approximately 25cc of fentanyl drip into sink and flushed with charge RN Dede Turner. Philip Kotlyar, Therapist, sports

## 2015-02-05 NOTE — Procedures (Signed)
Extubation Procedure Note  Patient Details:   Name: Collin Pearson DOB: 1926-11-28 MRN: [id number]   Airway Documentation:   Pt extubated at this time per MD order, placed on 4L [place], spo2 decreased to 89%, increased to 6L [place], pt tolerating well, no distress noted at this time  Evaluation  O2 sats: stable throughout Complications: No apparent complications Patient did tolerate procedure well. Bilateral Breath Sounds: Clear Suctioning: Oral, Airway Yes  Ciro Backer 02/05/2015, 4:02 PM

## 2015-02-05 NOTE — Progress Notes (Signed)
Pt failed SBT at this time due to decreased respiratory effort, dec RR & VT.  Placed pt back on full support, will try wean later when pt more awake.

## 2015-02-06 LAB — COMPREHENSIVE METABOLIC PANEL
ALT: 92 U/L — AB (ref 17–63)
AST: 54 U/L — ABNORMAL HIGH (ref 15–41)
Albumin: 2.5 g/dL — ABNORMAL LOW (ref 3.5–5.0)
Alkaline Phosphatase: 67 U/L (ref 38–126)
Anion gap: 13 (ref 5–15)
BILIRUBIN TOTAL: 0.8 mg/dL (ref 0.3–1.2)
BUN: 66 mg/dL — ABNORMAL HIGH (ref 6–20)
CALCIUM: 8.7 mg/dL — AB (ref 8.9–10.3)
CO2: 23 mmol/L (ref 22–32)
Chloride: 102 mmol/L (ref 101–111)
Creatinine, Ser: 3.39 mg/dL — ABNORMAL HIGH (ref 0.61–1.24)
GFR calc Af Amer: 17 mL/min — ABNORMAL LOW (ref >60)
GFR, EST NON AFRICAN AMERICAN: 15 mL/min — AB (ref >60)
GLUCOSE: 109 mg/dL — AB (ref 65–99)
POTASSIUM: 4.5 mmol/L (ref 3.5–5.1)
Sodium: 138 mmol/L (ref 135–145)
Total Protein: 5.5 g/dL — ABNORMAL LOW (ref 6.5–8.1)

## 2015-02-06 LAB — GLUCOSE, CAPILLARY
GLUCOSE-CAPILLARY: 112 mg/dL — AB (ref 65–99)
GLUCOSE-CAPILLARY: 100 mg/dL — AB (ref 65–99)
Glucose-Capillary: 138 mg/dL — ABNORMAL HIGH (ref 65–99)
Glucose-Capillary: 95 mg/dL (ref 65–99)
Glucose-Capillary: 136 mg/dL — ABNORMAL HIGH (ref 65–99)
Glucose-Capillary: 163 mg/dL — ABNORMAL HIGH (ref 65–99)

## 2015-02-06 LAB — CBC
HCT: 28.1 % — ABNORMAL LOW (ref 39.0–52.0)
HEMOGLOBIN: 9 g/dL — AB (ref 13.0–17.0)
MCH: 31.3 pg (ref 26.0–34.0)
MCHC: 32 g/dL (ref 30.0–36.0)
MCV: 97.6 fL (ref 78.0–100.0)
PLATELETS: 177 10*3/uL (ref 150–400)
RBC: 2.88 MIL/uL — ABNORMAL LOW (ref 4.22–5.81)
RDW: 17.8 % — ABNORMAL HIGH (ref 11.5–15.5)
WBC: 12.5 10*3/uL — ABNORMAL HIGH (ref 4.0–10.5)

## 2015-02-06 LAB — PROTIME-INR
INR: 1.22 (ref 0.00–1.49)
PROTHROMBIN TIME: 15.6 s — AB (ref 11.6–15.2)

## 2015-02-06 LAB — PROCALCITONIN: Procalcitonin: 0.47 ng/mL

## 2015-02-06 MED ORDER — PANTOPRAZOLE SODIUM 40 MG PO TBEC
40.0000 mg | DELAYED_RELEASE_TABLET | Freq: Every day | ORAL | Status: DC
  Administered 2015-02-06 – 2015-02-10 (×5): 40 mg via ORAL
  Filled 2015-02-06 (×4): qty 1

## 2015-02-06 MED ORDER — HYDRALAZINE HCL 50 MG PO TABS
50.0000 mg | ORAL_TABLET | Freq: Three times a day (TID) | ORAL | Status: DC
  Administered 2015-02-06 – 2015-02-11 (×14): 50 mg via ORAL
  Filled 2015-02-06 (×18): qty 1

## 2015-02-06 MED ORDER — CETYLPYRIDINIUM CHLORIDE 0.05 % MT LIQD
7.0000 mL | Freq: Two times a day (BID) | OROMUCOSAL | Status: DC
  Administered 2015-02-06: 7 mL via OROMUCOSAL

## 2015-02-06 MED ORDER — SODIUM CHLORIDE 0.9 % IJ SOLN
10.0000 mL | Freq: Two times a day (BID) | INTRAMUSCULAR | Status: DC
  Administered 2015-02-07 (×2): 10 mL
  Administered 2015-02-07: 30 mL
  Administered 2015-02-08 – 2015-02-09 (×2): 10 mL

## 2015-02-06 NOTE — Progress Notes (Signed)
Pt transferred from Intensive Care Unit to 2W. He was not in any distress at transfer. Family at bedside. He denies any dizziness. Vital signs stable. He is sinus brady in the 50s on telemetry.

## 2015-02-06 NOTE — Progress Notes (Signed)
PULMONARY / CRITICAL CARE MEDICINE   Name: Collin Pearson MRN: [id number] DOB: 10-18-1926    ADMISSION DATE:  02/03/2015 CONSULTATION DATE:  02/06/2015  REFERRING MD :  [facility]  CHIEF COMPLAINT:  Bradycardia  INITIAL PRESENTATION:  81 y.o. M brought to [place] with pre-syncope, found to have bradycardia in 20's.  Started on isoproterenol and transferred to [facility] ICU.   STUDIES:  6/22 Echo: Left ventricle: The cavity size was normal. Wall thickness wasincreased in a pattern of mild LVH. Systolic function was normal.The estimated ejection fraction was in the range of 60% to 65%.Wall motion was normal; there were no regional wall motionabnormalities.Aortic valve: Valve area (VTI): 3.54 cm^2. Valve area (Vmax):3.81 cm^2. Valve area (Vmean): 3.47 cm^2.Mitral valve: There was mild regurgitation. Left atrium: The atrium was mildly dilated.Atrial septum: No defect or patent foramen ovale was identified.  SIGNIFICANT EVENTS: 6/21 - admit. 6/22: Bradycardia with hemodynamic instability, Intubation d/t airway protection, Aline and CVL placed, levophed and dopamine started. Isoproter. stopped 6/23- extubated, awake, alert, off dop, no sympto brady  SUBJECTIVE: dop , HTN , ett remains  VITAL SIGNS: Temp:  [98.1 F (36.7 C)-98.9 F (37.2 C)] 98.4 F (36.9 C) (06/24 0732) Pulse Rate:  [51-66] 59 (06/24 1000) Resp:  [8-20] 8 (06/24 1000) BP: (117-170)/(49-80) 122/80 mmHg (06/24 1000) SpO2:  [84 %-100 %] 99 % (06/24 1000) Arterial Line BP: (98-191)/(26-115) 183/54 mmHg (06/24 1000) FiO2 (%):  [40 %] 40 % (06/23 1300) Weight:  [77.8 kg (171 lb 8.3 oz)] 77.8 kg (171 lb 8.3 oz) (06/24 0500) HEMODYNAMICS:   VENTILATOR SETTINGS: Vent Mode:  [-] PSV FiO2 (%):  [40 %] 40 % Set Rate:  [12 bmp] 12 bmp Vt Set:  [500 mL] 500 mL PEEP:  [5 cmH20] 5 cmH20 Pressure Support:  [5 cmH20] 5 cmH20 Plateau Pressure:  [24 cmH20] 24 cmH20 INTAKE / OUTPUT: Intake/Output      06/23 0701 - 06/24 0700 06/24  0701 - 06/25 0700   I.V. (mL/kg) 296 (3.8) 20 (0.3)   NG/GT     IV Piggyback 550    Total Intake(mL/kg) 846 (10.9) 20 (0.3)   Urine (mL/kg/hr) 1305 (0.7) 80 (0.3)   Emesis/NG output 100 (0.1)    Total Output 1405 80   Net -559.1 -60          PHYSICAL EXAMINATION: General: Elderly male, intubated lying in bed in No distress Neuro: alert, O x 4, nonfocal HEENT: [place]/AT. PERRL, sclerae anicteric. Cardiovascular: RRR, soft systolic murmur 2/6, carotid bruit bilaterally worse on Left, not brady Lungs: CTA Abdomen: BS x 4, soft, NT/ND.  Musculoskeletal: No gross deformities, 3+ pitting edema bilateral LE's. Skin: Intact, warm, no rashes.  LABS:  CBC  Recent Labs Lab 02/04/15 0436 02/05/15 0524 02/06/15 0430  WBC 11.9* 14.1* 12.5*  HGB 8.2* 10.1* 9.0*  HCT 24.9* 30.7* 28.1*  PLT 143* 192 177   Coag's  Recent Labs Lab 02/06/15 0430  INR 1.22   BMET  Recent Labs Lab 02/04/15 1229 02/05/15 0524 02/06/15 0430  NA 132* 136 138  K 4.4 3.9 4.5  CL 98* 101 102  CO2 21* 24 23  BUN 72* 64* 66*  CREATININE 3.82* 3.41* 3.39*  GLUCOSE 191* 72 109*   Electrolytes  Recent Labs Lab 02/04/15 0436 02/04/15 1229 02/05/15 0524 02/06/15 0430  CALCIUM 8.2* 8.4* 8.9 8.7*  MG 2.2 2.3 2.2  --   PHOS 4.1 4.0 2.9  --    Sepsis Markers  Recent Labs Lab  02/04/15 1229 02/04/15 1715 02/05/15 0503 02/05/15 0524 02/06/15 0430  LATICACIDVEN 2.7* 1.6 0.7  --   --   PROCALCITON 0.46  --   --  0.50 0.47   ABG  Recent Labs Lab 02/04/15 0755 02/04/15 1257 02/05/15 0441  PHART 7.457* 7.397 7.460*  PCO2ART 28.7* 32.5* 36.8  PO2ART 75.0* 110.0* 81.0   Liver Enzymes  Recent Labs Lab 02/03/15 2244 02/06/15 0430  AST 103* 54*  ALT 104* 92*  ALKPHOS 72 67  BILITOT 0.7 0.8  ALBUMIN 2.7* 2.5*   Cardiac Enzymes  Recent Labs Lab 02/03/15 2244 02/04/15 0436 02/04/15 1229  TROPONINI 0.04* 0.07* 0.13*   Glucose  Recent Labs Lab 02/05/15 1146 02/05/15 1630  02/05/15 2036 02/05/15 2326 02/06/15 0310 02/06/15 0731  GLUCAP 79 157* 147* 138* 112* 100*    Imaging No results found.   ASSESSMENT / PLAN:  PULMONARY OETT>>> 6/22 A: Intubated for airway protection Resolving edema rt P:   Even to neg balance goals IS upright  CARDIOVASCULAR A:  Sinus bradycardia - with EMS as low as 20's, now improved on isoproterenol. Has not required pacing since arrival to [facility]  Could be due to decreased clearance of outpatient meds given his AoCKD. Troponin leak,  Hypotension>>>Now hypertensive Hx CAD, HTN, AS s/p AVR, PVD, PAF (not on anticoagulation). P:  Tele Continued to hold amio, BB,  Off dop, , HR wnl Check K in am   RENAL A:   Acute on CKD, ATN AG Metabolic acidosis- resolved Lactic acidosis>>>resolved Hyponatremia resolved P:   kvo Follow BMET in am for k No lasix further  GASTROINTESTINAL A:   GERD. Transaminitis - chronic, r/o some shock Nutrition. P:   Start diet ppi remain, home med  HEMATOLOGIC A:   Anemia. VTE Prophylaxis. P:  SCD's / Heparin until ambulation  INFECTIOUS A:   R/o infection, ( unimpressed), pct neg, good clinical progress, rapid P:   Trend fever curve BCx 6/21 >>>not resulted 6/23 AM Monitor clinically.  vanc 6/22>>>6/23 azithro 6/22>>6/24 ceftriaxone 6/22>>>6/24  Dc all abx  ENDOCRINE A:   DM.   TSH mildly elevated P:   Q4 hr accucheck while NPO SSI. Continue to hold Hold outpatient levemir, glipizide.  NEUROLOGIC A:   Acute encephalopathy- sepsis vs poor cerebral perfusion during bradycardia P:   -PT consult -IS  Family updated: Family at bedside 6/22. Updated on plan of care. Consent obtained for intubation and line placement.  Interdisciplinary Family Meeting v Palliative Care Meeting:  Due by: 6/27.  To tele, triad  [name]. Titus Mould, MD, [place] Pgr: [facility] 02/06/2015 10:56 AM

## 2015-02-06 NOTE — Evaluation (Signed)
Physical Therapy Evaluation Patient Details Name: Collin Pearson MRN: [id number] DOB: 02-Aug-1927 Today's Date: 02/06/2015   History of Present Illness  Adm 6/21 with pre-syncope (also had fallen 2 days PTA), found to be bradycardic; developed hypotension and required intubation 6/22-6/23 with pressors. Awaiting PPM  PMHx- CABG, AVR, clotting disorder, DM, HTN, PVD, CKD  Clinical Impression  Pt admitted with above diagnosis. Pt currently with functional limitations due to the deficits listed below (see PT Problem List). Pt remains disoriented and no family present to discuss home situation and assist available on discharge. (Pt reports his wife works). Will continue to assess d/c needs. Pt will benefit from skilled PT to increase their independence and safety with mobility.       Follow Up Recommendations Supervision/Assistance - 24 hour (TBA further as confusion clears; family availability known)    Equipment Recommendations  None recommended by PT    Recommendations for Other Services       Precautions / Restrictions Precautions Precautions: Fall      Mobility  Bed Mobility Overal bed mobility: Needs Assistance Bed Mobility: Supine to Sit     Supine to sit: Mod assist;HOB elevated     General bed mobility comments: on deflated ICU bed; incr assist to scoot to EOB  Transfers Overall transfer level: Needs assistance Equipment used: 1 person hand held assist Transfers: Sit to/from [facility] Sit to Stand: Mod assist Stand pivot transfers: Mod assist       General transfer comment: posterior lean initially; pt reaching for bil UE support (one hand with PT, one on armrest of chair); flexed posture, small slow steps  Ambulation/Gait             General Gait Details: unable with one person/multiple lines/monitors  Stairs            Wheelchair Mobility    Modified Rankin (Stroke Patients Only)       Balance Overall balance assessment:  Needs assistance;History of Falls Sitting-balance support: Bilateral upper extremity supported;Feet supported Sitting balance-Leahy Scale: Poor Sitting balance - Comments: posterior lean initially Postural control: Posterior lean Standing balance support: Bilateral upper extremity supported Standing balance-Leahy Scale: Poor Standing balance comment: posterior lean                             Pertinent Vitals/Pain SaO2 94-96% on 2L O2 BP 144/48 supine; 154/71 sitting  HR 58-60  Pain Assessment: No/denies pain    Home Living Family/patient expects to be discharged to:: Private residence Living Arrangements: Spouse/significant other Available Help at Discharge: Family;Available PRN/intermittently (reports wife works) Type of Home: House Home Access: Stairs to enter Entrance Stairs-Rails: Psychiatric nurse of Steps: [place]: One level Home Equipment: Environmental consultant - 2 wheels;Cane - single point (maybe more; pt encephalopathic) Additional Comments: information from previous record; pt confused    Prior Function Level of Independence: Independent with assistive device(s)         Comments: uses cane vs RW depending on how he feels     Hand Dominance   Dominant Hand: Right    Extremity/Trunk Assessment   Upper Extremity Assessment: Generalized weakness           Lower Extremity Assessment: Generalized weakness      Cervical / Trunk Assessment: Kyphotic  Communication   Communication: HOH  Cognition Arousal/Alertness: Awake/alert Behavior During Therapy: WFL for tasks assessed/performed Overall Cognitive Status: No family/caregiver present to determine baseline cognitive  functioning Area of Impairment: Orientation;Memory;Problem solving Orientation Level: Place;Time;Situation   Memory: Decreased short-term memory       Problem Solving: Slow processing;Decreased initiation;Difficulty sequencing;Requires verbal cues;Requires tactile  cues General Comments: re-oriented multiple times during session with poor recall    General Comments      Exercises        Assessment/Plan    PT Assessment Patient needs continued PT services  PT Diagnosis Difficulty walking   PT Problem List Decreased strength;Decreased activity tolerance;Decreased balance;Decreased mobility;Decreased knowledge of use of DME;Pain;Decreased cognition;Decreased knowledge of precautions  PT Treatment Interventions DME instruction;Gait training;Stair training;Functional mobility training;Patient/family education;Therapeutic activities;Therapeutic exercise;Balance training;Cognitive remediation   PT Goals (Current goals can be found in the Care Plan section) Acute Rehab PT Goals Patient Stated Goal: get to walking PT Goal Formulation: With patient Time For Goal Achievement: 02/20/15 Potential to Achieve Goals: Good    Frequency Min 3X/week   Barriers to discharge Decreased caregiver support pt reports wife works    Co-evaluation               End of Session Equipment Utilized During Treatment: Gait belt;Oxygen Activity Tolerance: Patient tolerated treatment well Patient left: in chair;with call bell/phone within reach Nurse Communication: Mobility status         Time: [id number] PT Time Calculation (min) (ACUTE ONLY): 27 min   Charges:   PT Evaluation $Initial PT Evaluation Tier I: 1 Procedure PT Treatments $Therapeutic Activity: 8-22 mins   PT G Codes:        Collin Pearson 03-01-2015, 4:24 PM Pager [phone]

## 2015-02-07 DIAGNOSIS — I4891 Unspecified atrial fibrillation: Secondary | ICD-10-CM | POA: Diagnosis present

## 2015-02-07 DIAGNOSIS — N179 Acute kidney failure, unspecified: Secondary | ICD-10-CM | POA: Diagnosis present

## 2015-02-07 DIAGNOSIS — N189 Chronic kidney disease, unspecified: Secondary | ICD-10-CM

## 2015-02-07 LAB — BASIC METABOLIC PANEL
Anion gap: 7 (ref 5–15)
BUN: 58 mg/dL — ABNORMAL HIGH (ref 6–20)
CO2: 26 mmol/L (ref 22–32)
Calcium: 8.3 mg/dL — ABNORMAL LOW (ref 8.9–10.3)
Chloride: 105 mmol/L (ref 101–111)
Creatinine, Ser: 2.89 mg/dL — ABNORMAL HIGH (ref 0.61–1.24)
GFR calc Af Amer: 21 mL/min — ABNORMAL LOW (ref >60)
GFR calc non Af Amer: 18 mL/min — ABNORMAL LOW (ref >60)
Glucose, Bld: 183 mg/dL — ABNORMAL HIGH (ref 65–99)
POTASSIUM: 4 mmol/L (ref 3.5–5.1)
SODIUM: 138 mmol/L (ref 135–145)

## 2015-02-07 LAB — GLUCOSE, CAPILLARY
GLUCOSE-CAPILLARY: 193 mg/dL — AB (ref 65–99)
GLUCOSE-CAPILLARY: 179 mg/dL — AB (ref 65–99)
Glucose-Capillary: 194 mg/dL — ABNORMAL HIGH (ref 65–99)
Glucose-Capillary: 168 mg/dL — ABNORMAL HIGH (ref 65–99)
Glucose-Capillary: 173 mg/dL — ABNORMAL HIGH (ref 65–99)

## 2015-02-07 MED ORDER — ACETAMINOPHEN 325 MG PO TABS
650.0000 mg | ORAL_TABLET | Freq: Four times a day (QID) | ORAL | Status: DC | PRN
  Administered 2015-02-07: 650 mg via ORAL

## 2015-02-07 MED ORDER — FUROSEMIDE 10 MG/ML IJ SOLN
40.0000 mg | Freq: Every day | INTRAMUSCULAR | Status: DC
  Administered 2015-02-07: 40 mg via INTRAVENOUS
  Filled 2015-02-07 (×2): qty 4

## 2015-02-07 MED ORDER — INSULIN ASPART 100 UNIT/ML ~~LOC~~ SOLN
0.0000 [IU] | Freq: Three times a day (TID) | SUBCUTANEOUS | Status: DC
  Administered 2015-02-08: 8 [IU] via SUBCUTANEOUS
  Administered 2015-02-08 – 2015-02-09 (×3): 2 [IU] via SUBCUTANEOUS
  Administered 2015-02-10: 3 [IU] via SUBCUTANEOUS
  Administered 2015-02-10: 5 [IU] via SUBCUTANEOUS
  Administered 2015-02-10 – 2015-02-11 (×2): 2 [IU] via SUBCUTANEOUS
  Administered 2015-02-11: 3 [IU] via SUBCUTANEOUS

## 2015-02-07 MED ORDER — ISOSORBIDE MONONITRATE 15 MG HALF TABLET
15.0000 mg | ORAL_TABLET | Freq: Every day | ORAL | Status: DC
  Administered 2015-02-07 – 2015-02-08 (×2): 15 mg via ORAL
  Filled 2015-02-07 (×2): qty 1

## 2015-02-07 MED ORDER — SODIUM CHLORIDE 0.9 % IJ SOLN
10.0000 mL | INTRAMUSCULAR | Status: DC | PRN
Start: 2015-02-07 — End: 2015-02-11
  Administered 2015-02-07 – 2015-02-08 (×5): 10 mL
  Administered 2015-02-09 – 2015-02-10 (×2): 30 mL
  Administered 2015-02-10: 10 mL
  Filled 2015-02-07 (×8): qty 40

## 2015-02-07 MED ORDER — INSULIN GLARGINE 100 UNIT/ML ~~LOC~~ SOLN
12.0000 [IU] | Freq: Every day | SUBCUTANEOUS | Status: DC
  Administered 2015-02-07 – 2015-02-08 (×2): 12 [IU] via SUBCUTANEOUS
  Filled 2015-02-07 (×2): qty 0.12

## 2015-02-07 MED ORDER — ISOSORBIDE MONONITRATE ER 30 MG PO TB24
30.0000 mg | ORAL_TABLET | Freq: Every day | ORAL | Status: DC
  Filled 2015-02-07: qty 1

## 2015-02-07 MED ORDER — INSULIN ASPART 100 UNIT/ML ~~LOC~~ SOLN: 0.0000 [IU] | Freq: Every day | SUBCUTANEOUS | Status: DC

## 2015-02-07 NOTE — Progress Notes (Signed)
Scrotal edema noted. Patient voices occasional burning sensation. Area elevated and ice applied. Good urinary output. No increase in swelling throughout shift.

## 2015-02-07 NOTE — Progress Notes (Signed)
Pt and family requested info on advanced directives and poa. Packet was given to pt and pt was told that spiritual care or social work would come to see them either tomorrow or Monday morning.  Will pass on to oncoming RN.

## 2015-02-07 NOTE — Progress Notes (Signed)
Attempted to return phone call to spouse, Calcifer Schweitzer. Unable to reach her voicemail to call back to the unit at earliest convenience.

## 2015-02-07 NOTE — Progress Notes (Signed)
SUBJECTIVE:  Complains of LE edema and scrotal edema  OBJECTIVE:   Vitals:   Filed Vitals:   02/06/15 1800 02/06/15 1804 02/06/15 2025 02/07/15 0450  BP:  164/48 161/56 158/49  Pulse: 58 58 52 58  Temp:   98.6 F (37 C) 99.1 F (37.3 C)  TempSrc:   Oral Oral  Resp: 15 14 18 18   Weight:    169 lb 12.1 oz (77 kg)  SpO2: 94% 95% 98% 97%   I&O's:   Intake/Output Summary (Last 24 hours) at 02/07/15 0835 Last data filed at 02/07/15 0130  Gross per 24 hour  Intake     70 ml  Output    685 ml  Net   -615 ml   TELEMETRY: Reviewed telemetry pt in sinus bradycardia in the 50's:     PHYSICAL EXAM General: Well developed, well nourished, in no acute distress Head: Eyes PERRLA, No xanthomas.   Normal cephalic and atramatic  Lungs:   Clear bilaterally to auscultation and percussion. Heart:   HRRR S1 S2 Pulses are 2+ & equal. Abdomen: Bowel sounds are positive, abdomen soft and non-tender without masses Extremities: asymmetric scrotal edema and 1+ LE edema and arm swelling with oozing Neuro: Alert and oriented X 3. Psych:  Good affect, responds appropriately   LABS: Basic Metabolic Panel:  Recent Labs  02/04/15 1229 02/05/15 0524 02/06/15 0430 02/07/15 0445  NA 132* 136 138 138  K 4.4 3.9 4.5 4.0  CL 98* 101 102 105  CO2 21* 24 23 26   GLUCOSE 191* 72 109* 183*  BUN 72* 64* 66* 58*  CREATININE 3.82* 3.41* 3.39* 2.89*  CALCIUM 8.4* 8.9 8.7* 8.3*  MG 2.3 2.2  --   --   PHOS 4.0 2.9  --   --    Liver Function Tests:  Recent Labs  02/06/15 0430  AST 54*  ALT 92*  ALKPHOS 67  BILITOT 0.8  PROT 5.5*  ALBUMIN 2.5*   No results for input(s): LIPASE, AMYLASE in the last 72 hours. CBC:  Recent Labs  02/05/15 0524 02/06/15 0430  WBC 14.1* 12.5*  HGB 10.1* 9.0*  HCT 30.7* 28.1*  MCV 94.5 97.6  PLT 192 177   Cardiac Enzymes:  Recent Labs  02/04/15 1229  TROPONINI 0.13*   BNP: Invalid input(s): POCBNP D-Dimer: No results for input(s): DDIMER in the  last 72 hours. Hemoglobin A1C: No results for input(s): HGBA1C in the last 72 hours. Fasting Lipid Panel: No results for input(s): CHOL, HDL, LDLCALC, TRIG, CHOLHDL, LDLDIRECT in the last 72 hours. Thyroid Function Tests: No results for input(s): TSH, T4TOTAL, T3FREE, THYROIDAB in the last 72 hours.  Invalid input(s): FREET3 Anemia Panel: No results for input(s): VITAMINB12, FOLATE, FERRITIN, TIBC, IRON, RETICCTPCT in the last 72 hours. Coag Panel:   Lab Results  Component Value Date   INR 1.22 02/06/2015   INR 1.55* 06/13/2012   INR 1.06 06/12/2012    RADIOLOGY: Dg Chest Port 1 View  02/05/2015   CLINICAL DATA:  Intubation.  EXAM: PORTABLE CHEST - 1 VIEW  COMPARISON:  02/04/2015.  FINDINGS: Endotracheal tube, left IJ line, NG tube in stable position. Right carotid stent again noted in stable position. Mediastinum and hilar structures are normal. Prior cardiac valve replacement. Stable cardiomegaly. Persistent low lung volumes with bibasilar atelectasis and/or infiltrates. Small pleural effusions. No pneumothorax.  IMPRESSION: 1. Lines and tubes in stable position. Right carotid stent in stable position. 2. Persistent bibasilar atelectasis and/or infiltrates. Small pleural effusions.  3. Prior cardiac valve replacement.  Stable cardiomegaly.   Electronically Signed   By: Marcello Moores  Register   On: 02/05/2015 07:32   Dg Chest Port 1 View  02/04/2015   CLINICAL DATA:  Hypoxia  EXAM: PORTABLE CHEST - 1 VIEW  COMPARISON:  Study obtained earlier in the day  FINDINGS: Endotracheal tube tip is 1.8 cm above the carina. Central catheter tip is at the junction of the left innominate vein and superior vena cava. No pneumothorax. There is consolidation in the left lower lobe. There is mild atelectasis in the right mid lung. Lungs elsewhere are clear. Heart is upper normal in size with pulmonary vascular within normal limits. The patient has an aortic valve replacement. The patient is also status post coronary  artery bypass grafting. There is a right carotid region stent.  IMPRESSION: Tube and catheter positions as described without pneumothorax. Increased opacity consistent consolidation left lower lobe. Stable atelectasis right midlung. No change in cardiac silhouette.   Electronically Signed   By: Lowella Grip III M.D.   On: 02/04/2015 14:11   Dg Chest Port 1 View  02/04/2015   CLINICAL DATA:  Heart failure, pulmonary edema  EXAM: PORTABLE CHEST - 1 VIEW  COMPARISON:  02/04/2015  FINDINGS: Stable postop changes of coronary bypass, aortic valve replacement, and right carotid stent. Defibrillator pads overlie the left chest. Stable cardiomegaly without superimposed edema or CHF. Low lung volumes persist with streaky bibasilar atelectasis. No significant interval change. No developing or enlarging effusion. Negative for pneumothorax. Trachea is midline. Aortic atherosclerosis evident.  IMPRESSION: Stable postoperative findings  Stable cardiomegaly with low lung volumes and bibasilar atelectasis.  Aortic atherosclerosis   Electronically Signed   By: Jerilynn Mages.  Shick M.D.   On: 02/04/2015 07:53   Dg Chest Port 1 View  02/04/2015   CLINICAL DATA:  Acute onset of hypoxia.  Initial encounter.  EXAM: PORTABLE CHEST - 1 VIEW  COMPARISON:  Chest radiograph performed 02/03/2015  FINDINGS: The lungs are hypoexpanded. Mild bibasilar opacities may reflect mild interstitial edema or pneumonia. No pleural effusion or pneumothorax is seen.  The cardiomediastinal silhouette is mildly enlarged. The patient is status post median sternotomy, with evidence of prior CABG. An aortic valve replacement is noted. No acute osseous abnormalities are identified. A vascular stent is noted along the right side of the neck.  IMPRESSION: Lungs hypoexpanded. Mild bibasilar opacities may reflect mild interstitial edema or pneumonia. Mild cardiomegaly.   Electronically Signed   By: Garald Balding M.D.   On: 02/04/2015 03:26   Dg Abd Portable  1v  02/04/2015   CLINICAL DATA:  Check nasogastric catheter placement  EXAM: PORTABLE ABDOMEN - 1 VIEW  COMPARISON:  None.  FINDINGS: Scattered large and small bowel gas is noted. A lamellated gallstone is noted in the right upper quadrant. A nasogastric catheter is seen within the mid stomach. No other focal abnormality is noted.  IMPRESSION: Nasogastric catheter within the mid stomach.   Electronically Signed   By: Inez Catalina M.D.   On: 02/04/2015 14:58   ASSESSMENT AND PLAN:   1. Sinus bradycardia - sinus node dysfunction with junctional bradycardia - ? Related to amiodarone.  The patient presented with symptomatic sinus bradycardia in the setting of underlying conduction system disease and pre-syncope. His amiodarone and metoprolol have been held.  Will allow current issues to resolve and see what happens to rate with drug wash out.  Will follow with you and decide as we go as to timing of  pacing if indicated.    2. Atrial fibrillation Identified immediately post op CABG 2013 He has had no documented atrial fibrillation since Would not resume amiodarone unless he has recurrent documented AF If he has recurrent AF, will need to consider [place] with CHADS2VASC of at least 6  3. CAD No recent ischemic symptoms Will discuss with Dr Gwenlyn Found who knows him well need for long-term beta blocker therapy for CAD as this may impact decision about permanent pacemaker  4. Aortic stenosis s/p AVR Echocardiogram yesterday with normal valve function  5.  LE edema - he is on Lasix at home and had CKD.  Will leave this up to Hospitalist.  He is complaining of scrotal edema that he says was not there until he was admitted and had a traumatic foley insertion.  It is asymmetric edema.  ? Whether this is actually fluid overload or trauma.  Recommend restarting Lasix if ok with hospitalist.     Sueanne Margarita, MD  02/07/2015  8:35 AM

## 2015-02-07 NOTE — Progress Notes (Addendum)
[facility] PROGRESS NOTE  Collin Pearson [id number] DOB: September 30, 1926 DOA: 02/03/2015 PCP: Helen Hashimoto., MD  Assessment/Plan: 1. Bradycardia. -Patient presented with hemodynamic compromise in setting of sinus bradycardia, found to have heart rates in the 20s on the field. Transcutaneous pacer had been placed. He was initially started on isoproterenol, then placed on dopamine in the intensive care unit. -It was felt that amiodarone and metoprolol may have led to sinus bradycardia, particularly with decreased clearance in setting of chronic kidney disease. -Patient's heart rates improved with telemetry showing ventricular rates in the 50s to 60s. He is hemodynamically stable, mentating well. -Cardiology following, will likely have pacemaker implant  2. Fluid overload. -Patient having evidence of volume overload on physical examination with the presence of bilateral extremity pitting edema along with scrotal edema. -Will start Lasix 40 mg IV daily today, monitor kidney function given the development of acute on chronic renal failure. At home he takes 80 mg in a.m., 40 mg in p.m.  3.  Acute on chronic renal failure. -Likely secondary to decreased cardiac output and hypotension in setting of severe bradycardia. -patient has a history of stage IV chronic kidney disease, appears to have a baseline creatinine near 2.5-2.8 -Initially presented with creatinine of 3.41, improving to 2.89 on a.m. Lab work -Patient receiving 40 mg of IV Lasix, will monitor kidney function closely  4.  History of atrial fibrillation with a CHADVasc score of 6 -Patient previously on amiodarone and metoprolol, both agents have been discontinued due to profound bradycardia. -Follow cardiology recommendations regarding anticoagulation.  5.  History of type 2 diabetes mellitus. -patient had been on glipizide 10 mg at home -Will start Lantus 12 units subcutaneous daily at bedtime, cover with sliding scale  insulin with meals and at bedtime  6.  Hypertension. -Patient's blood pressures now elevated, having systolic blood pressures in the 160s to 170s -Metoprolol has been discontinued. Will add imdur 15 mg PO q daily to hydralazine 50 mg PO TID  Code Status: full code Family Communication: spoke with family members present at bedside Disposition Plan:    Consultants:  Cardiology  Pulmonary medicine    HPI/Subjective: Patient is an 81 year old gentleman with a past medical history of hypertension, coronary disease, aortic stenosis, type 2 diabetes mellitus, who had been on amiodarone and metoprolol, he reported feeling presyncopal, EMS called, found to be bradycardic having a heart rate in the 20s on the field. A transcutaneous pacer was placed in route to [facility]. In the emergency department at outside facility he was started on isoproterenol and admitted to the intensive care unit under the care of pulmonary critical care medicine. Patient underwent rapid sequence intubation and started on IV dopamine and levo fed.Cardiology was consulted as patient was evaluated by Dr. Caryl Comes. It was felt that bradycardia could have resulted from amiodarone and metoprolol particularly with decreased clearance given history of renal failure. Patient was weaned off of dopamine and extubated on 02/05/2015. Heart rates improving.a transthoracic echocardiogram was performed on 02/04/2015 that showed an EF of 60-65%. Patient was transferred to 2 W. On 02/06/2015.  Objective: Filed Vitals:   02/07/15 1414  BP: 162/43  Pulse: 56  Temp: 98.1 F (36.7 C)  Resp: 16    Intake/Output Summary (Last 24 hours) at 02/07/15 1559 Last data filed at 02/07/15 1500  Gross per 24 hour  Intake     30 ml  Output    975 ml  Net   -945 ml   [employer]  02/05/15 0500 02/06/15 0500 02/07/15 0450  Weight: 77.5 kg (170 lb 13.7 oz) 77.8 kg (171 lb 8.3 oz) 77 kg (169 lb 12.1 oz)    Exam:   General:  Patient  is confused however awake and alert, mentating well  Cardiovascular: regular rate and rhythm normal S1-S2 no murmurs rubs or gallops  Respiratory: normal respiratory effort, has a few bibasilar crackles  Abdomen: soft nontender nondistended  Musculoskeletal: he has 2+ bilateral extremity pitting edema along with presence of scrotal and penile edema  Data Reviewed: Basic Metabolic Panel:  Recent Labs Lab 02/03/15 2244 02/04/15 0436 02/04/15 1229 02/05/15 0524 02/06/15 0430 02/07/15 0445  NA 134* 133* 132* 136 138 138  K 4.0 4.0 4.4 3.9 4.5 4.0  CL 99* 99* 98* 101 102 105  CO2 19* 20* 21* 24 23 26   GLUCOSE 283* 302* 191* 72 109* 183*  BUN 77* 73* 72* 64* 66* 58*  CREATININE 3.63* 3.70* 3.82* 3.41* 3.39* 2.89*  CALCIUM 8.2* 8.2* 8.4* 8.9 8.7* 8.3*  MG 2.2 2.2 2.3 2.2  --   --   PHOS 3.9 4.1 4.0 2.9  --   --    Liver Function Tests:  Recent Labs Lab 02/03/15 2244 02/06/15 0430  AST 103* 54*  ALT 104* 92*  ALKPHOS 72 67  BILITOT 0.7 0.8  PROT 5.2* 5.5*  ALBUMIN 2.7* 2.5*   No results for input(s): LIPASE, AMYLASE in the last 168 hours. No results for input(s): AMMONIA in the last 168 hours. CBC:  Recent Labs Lab 02/03/15 2244 02/04/15 0436 02/05/15 0524 02/06/15 0430  WBC 9.9 11.9* 14.1* 12.5*  HGB 8.3* 8.2* 10.1* 9.0*  HCT 24.9* 24.9* 30.7* 28.1*  MCV 96.5 94.3 94.5 97.6  PLT 124* 143* 192 177   Cardiac Enzymes:  Recent Labs Lab 02/03/15 2244 02/04/15 0436 02/04/15 1229  TROPONINI 0.04* 0.07* 0.13*   BNP (last 3 results) No results for input(s): BNP in the last 8760 hours.  ProBNP (last 3 results) No results for input(s): PROBNP in the last 8760 hours.  CBG:  Recent Labs Lab 02/06/15 1601 02/06/15 2045 02/07/15 0010 02/07/15 0447 02/07/15 1108  GLUCAP 136* 163* 194* 193* 179*    Recent Results (from the past 240 hour(s))  MRSA PCR Screening     Status: None   Collection Time: 02/03/15  7:07 PM  Result Value Ref Range Status    MRSA by PCR NEGATIVE NEGATIVE Final    Comment:        The GeneXpert MRSA Assay (FDA approved for NASAL specimens only), is one component of a comprehensive MRSA colonization surveillance program. It is not intended to diagnose MRSA infection nor to guide or monitor treatment for MRSA infections.   Culture, blood (routine x 2)     Status: None (Preliminary result)   Collection Time: 02/03/15 10:35 PM  Result Value Ref Range Status   Specimen Description BLOOD LEFT ARM  Final   Special Requests BOTTLES DRAWN AEROBIC ONLY 4CC  Final   Culture NO GROWTH 3 DAYS  Final   Report Status PENDING  Incomplete  Culture, blood (routine x 2)     Status: None (Preliminary result)   Collection Time: 02/03/15 10:44 PM  Result Value Ref Range Status   Specimen Description BLOOD LEFT HAND  Final   Special Requests BOTTLES DRAWN AEROBIC ONLY 3CC  Final   Culture NO GROWTH 3 DAYS  Final   Report Status PENDING  Incomplete     Studies: No results  found.  Scheduled Meds: . furosemide  40 mg Intravenous Daily  . heparin  5,000 Units Subcutaneous 3 times per day  . hydrALAZINE  50 mg Oral TID  . insulin aspart  0-20 Units Subcutaneous 6 times per day  . pantoprazole  40 mg Oral QHS  . sodium chloride  10-40 mL Intracatheter Q12H   Continuous Infusions: . dextrose 5 % and 0.45% NaCl Stopped (02/06/15 1600)    Active Problems:   Bradycardia   Encounter for nasogastric tube placement   Endotracheally intubated   Hypoxia   Pulmonary edema    Time spent: 35 min    Clementine Soulliere, [facility] Pager [phone]. If 7PM-7AM, please contact night-coverage at [email or web address], password [facility] 02/07/2015, 3:59 PM  LOS: 4 days

## 2015-02-07 NOTE — Progress Notes (Signed)
Pt c/o epigastric pain, which was originally reported by wife as CP.  On assessment, pt localized pain to epigastric area.  MD was notified and pt was given tylenol per md order.  Will continue to monitor.

## 2015-02-08 DIAGNOSIS — I482 Chronic atrial fibrillation: Secondary | ICD-10-CM

## 2015-02-08 DIAGNOSIS — R5381 Other malaise: Secondary | ICD-10-CM | POA: Diagnosis present

## 2015-02-08 LAB — GLUCOSE, CAPILLARY
GLUCOSE-CAPILLARY: 258 mg/dL — AB (ref 65–99)
Glucose-Capillary: 116 mg/dL — ABNORMAL HIGH (ref 65–99)
Glucose-Capillary: 137 mg/dL — ABNORMAL HIGH (ref 65–99)
Glucose-Capillary: 168 mg/dL — ABNORMAL HIGH (ref 65–99)

## 2015-02-08 LAB — CBC
HEMATOCRIT: 28.3 % — AB (ref 39.0–52.0)
HEMOGLOBIN: 9.1 g/dL — AB (ref 13.0–17.0)
MCH: 31.2 pg (ref 26.0–34.0)
MCHC: 32.2 g/dL (ref 30.0–36.0)
MCV: 96.9 fL (ref 78.0–100.0)
Platelets: 177 10*3/uL (ref 150–400)
RBC: 2.92 MIL/uL — ABNORMAL LOW (ref 4.22–5.81)
RDW: 17.5 % — ABNORMAL HIGH (ref 11.5–15.5)
WBC: 11.7 10*3/uL — ABNORMAL HIGH (ref 4.0–10.5)

## 2015-02-08 LAB — BASIC METABOLIC PANEL
Anion gap: 8 (ref 5–15)
BUN: 47 mg/dL — ABNORMAL HIGH (ref 6–20)
CO2: 26 mmol/L (ref 22–32)
Calcium: 8.2 mg/dL — ABNORMAL LOW (ref 8.9–10.3)
Chloride: 104 mmol/L (ref 101–111)
Creatinine, Ser: 2.43 mg/dL — ABNORMAL HIGH (ref 0.61–1.24)
GFR calc non Af Amer: 23 mL/min — ABNORMAL LOW (ref >60)
GFR, EST AFRICAN AMERICAN: 26 mL/min — AB (ref >60)
GLUCOSE: 117 mg/dL — AB (ref 65–99)
Potassium: 3.6 mmol/L (ref 3.5–5.1)
Sodium: 138 mmol/L (ref 135–145)

## 2015-02-08 MED ORDER — FUROSEMIDE 10 MG/ML IJ SOLN
60.0000 mg | Freq: Every day | INTRAMUSCULAR | Status: DC
  Administered 2015-02-08 – 2015-02-09 (×2): 60 mg via INTRAVENOUS
  Filled 2015-02-08 (×4): qty 6

## 2015-02-08 MED ORDER — ISOSORBIDE MONONITRATE ER 30 MG PO TB24
30.0000 mg | ORAL_TABLET | Freq: Every day | ORAL | Status: DC
  Filled 2015-02-08: qty 1

## 2015-02-08 NOTE — Progress Notes (Signed)
[facility] PROGRESS NOTE  Collin Pearson [id number] DOB: [birth year] DOA: 02/03/2015 PCP: Helen Hashimoto., MD  Assessment/Plan: 1. Bradycardia. -Patient presented with hemodynamic compromise in setting of sinus bradycardia, found to have heart rates in the 20s on the field. Transcutaneous pacer had been placed. He was initially started on isoproterenol, then placed on dopamine in the intensive care unit. -It was felt that amiodarone and metoprolol may have led to sinus bradycardia, particularly with decreased clearance in setting of chronic kidney disease. -Patient's heart rates improved with telemetry showing ventricular rates in the 50s to 60s. He is hemodynamically stable, mentating well. -His heart rates remained stable on telemetry.  -Plan to discuss with his primary cardiologist Dr. Gwenlyn Found regarding initiation of beta blocker therapy and pacemaker implant. Having heart rates in the upper 50s it would seem that the risk of beta blocker therapy outweigh benefits, however, will let cardiology weigh in.  2. Fluid overload. -Patient having evidence of volume overload on physical examination with the presence of bilateral extremity pitting edema along with scrotal edema. -Patient's still has evidence of volume overload, has a net negative fluid balance of the 2.16 L, putting out 1650 mL over the past 24 hours. -Kidney function remains stable, will continue Lasix at 60 mg IV every 24 hours  3.  Acute on chronic renal failure. -Likely secondary to decreased cardiac output and hypotension in setting of severe bradycardia. -patient has a history of stage IV chronic kidney disease, appears to have a baseline creatinine near 2.5-2.8 -Initially presented with creatinine of 3.41, improving to 2.89 on a.m. Lab work -Creatinine remained stable at 2.43 on a.m. lab work, given stability of kidney function will increase Lasix to 60 mg IV daily -Continue monitoring kidney function  4.   History of atrial fibrillation with a CHADVasc score of 6 -Patient previously on amiodarone and metoprolol, both agents have been discontinued due to profound bradycardia. -Follow cardiology recommendations regarding anticoagulation.  5.  History of type 2 diabetes mellitus. -patient had been on glipizide 10 mg at home -Started Lantus 12 units subcutaneous daily at bedtime, cover with sliding scale insulin with meals and at bedtime -Blood sugars for the most part remaining in the 100 range  6.  Hypertension. -Patient's blood pressures now elevated, having systolic blood pressures in the 160s to 170s -Metoprolol has been discontinued. Added imdur 15 mg PO q daily to hydralazine 50 mg PO TID -Blood pressures remain elevated, will increase imdur to 30 mg by mouth daily  Code Status: full code Family Communication: spoke with family members present at bedside Disposition Plan: Physical therapy consultation   Consultants:  Cardiology  Pulmonary medicine    HPI/Subjective: Patient is an 81 year old gentleman with a past medical history of hypertension, coronary disease, aortic stenosis, type 2 diabetes mellitus, who had been on amiodarone and metoprolol, he reported feeling presyncopal, EMS called, found to be bradycardic having a heart rate in the 20s on the field. A transcutaneous pacer was placed in route to [facility]. In the emergency department at outside facility he was started on isoproterenol and admitted to the intensive care unit under the care of pulmonary critical care medicine. Patient underwent rapid sequence intubation and started on IV dopamine and levo fed.Cardiology was consulted as patient was evaluated by Dr. Caryl Comes. It was felt that bradycardia could have resulted from amiodarone and metoprolol particularly with decreased clearance given history of renal failure. Patient was weaned off of dopamine and extubated on 02/05/2015. Heart rates improving.a  transthoracic  echocardiogram was performed on 02/04/2015 that showed an EF of 60-65%. Patient was transferred to 2 W. On 02/06/2015.  Objective: Filed Vitals:   02/08/15 1100  BP: 162/50  Pulse:   Temp:   Resp:     Intake/Output Summary (Last 24 hours) at 02/08/15 1331 Last data filed at 02/08/15 1103  Gross per 24 hour  Intake     10 ml  Output   1250 ml  Net  -1240 ml   Filed Weights   02/07/15 0450 02/07/15 1646 02/08/15 0640  Weight: 77 kg (169 lb 12.1 oz) 74.2 kg (163 lb 9.3 oz) 72.8 kg (160 lb 7.9 oz)    Exam:   General:  Patient is confused however awake and alert, mentating well, he was ambulated to hallway and back, seems a little stronger today  Cardiovascular: regular rate and rhythm normal S1-S2 no murmurs rubs or gallops  Respiratory: normal respiratory effort, has a few bibasilar crackles  Abdomen: soft nontender nondistended  Musculoskeletal: he has 2+ bilateral extremity pitting edema along with presence of scrotal and penile edema  Data Reviewed: Basic Metabolic Panel:  Recent Labs Lab 02/03/15 2244 02/04/15 0436 02/04/15 1229 02/05/15 0524 02/06/15 0430 02/07/15 0445 02/08/15 0430  NA 134* 133* 132* 136 138 138 138  K 4.0 4.0 4.4 3.9 4.5 4.0 3.6  CL 99* 99* 98* 101 102 105 104  CO2 19* 20* 21* 24 23 26 26   GLUCOSE 283* 302* 191* 72 109* 183* 117*  BUN 77* 73* 72* 64* 66* 58* 47*  CREATININE 3.63* 3.70* 3.82* 3.41* 3.39* 2.89* 2.43*  CALCIUM 8.2* 8.2* 8.4* 8.9 8.7* 8.3* 8.2*  MG 2.2 2.2 2.3 2.2  --   --   --   PHOS 3.9 4.1 4.0 2.9  --   --   --    Liver Function Tests:  Recent Labs Lab 02/03/15 2244 02/06/15 0430  AST 103* 54*  ALT 104* 92*  ALKPHOS 72 67  BILITOT 0.7 0.8  PROT 5.2* 5.5*  ALBUMIN 2.7* 2.5*   No results for input(s): LIPASE, AMYLASE in the last 168 hours. No results for input(s): AMMONIA in the last 168 hours. CBC:  Recent Labs Lab 02/03/15 2244 02/04/15 0436 02/05/15 0524 02/06/15 0430 02/08/15 0430  WBC 9.9 11.9*  14.1* 12.5* 11.7*  HGB 8.3* 8.2* 10.1* 9.0* 9.1*  HCT 24.9* 24.9* 30.7* 28.1* 28.3*  MCV 96.5 94.3 94.5 97.6 96.9  PLT 124* 143* 192 177 177   Cardiac Enzymes:  Recent Labs Lab 02/03/15 2244 02/04/15 0436 02/04/15 1229  TROPONINI 0.04* 0.07* 0.13*   BNP (last 3 results) No results for input(s): BNP in the last 8760 hours.  ProBNP (last 3 results) No results for input(s): PROBNP in the last 8760 hours.  CBG:  Recent Labs Lab 02/07/15 1108 02/07/15 1657 02/07/15 2113 02/08/15 0617 02/08/15 1120  GLUCAP 179* 168* 173* 116* 258*    Recent Results (from the past 240 hour(s))  MRSA PCR Screening     Status: None   Collection Time: 02/03/15  7:07 PM  Result Value Ref Range Status   MRSA by PCR NEGATIVE NEGATIVE Final    Comment:        The GeneXpert MRSA Assay (FDA approved for NASAL specimens only), is one component of a comprehensive MRSA colonization surveillance program. It is not intended to diagnose MRSA infection nor to guide or monitor treatment for MRSA infections.   Culture, blood (routine x 2)     Status:  None (Preliminary result)   Collection Time: 02/03/15 10:35 PM  Result Value Ref Range Status   Specimen Description BLOOD LEFT ARM  Final   Special Requests BOTTLES DRAWN AEROBIC ONLY 4CC  Final   Culture NO GROWTH 4 DAYS  Final   Report Status PENDING  Incomplete  Culture, blood (routine x 2)     Status: None (Preliminary result)   Collection Time: 02/03/15 10:44 PM  Result Value Ref Range Status   Specimen Description BLOOD LEFT HAND  Final   Special Requests BOTTLES DRAWN AEROBIC ONLY 3CC  Final   Culture NO GROWTH 4 DAYS  Final   Report Status PENDING  Incomplete     Studies: No results found.  Scheduled Meds: . furosemide  60 mg Intravenous Daily  . heparin  5,000 Units Subcutaneous 3 times per day  . hydrALAZINE  50 mg Oral TID  . insulin aspart  0-15 Units Subcutaneous TID WC  . insulin aspart  0-5 Units Subcutaneous QHS  .  insulin glargine  12 Units Subcutaneous QHS  . isosorbide mononitrate  15 mg Oral Daily  . pantoprazole  40 mg Oral QHS  . sodium chloride  10-40 mL Intracatheter Q12H   Continuous Infusions:    Active Problems:   Bradycardia   Encounter for nasogastric tube placement   Endotracheally intubated   Hypoxia   Pulmonary edema   Acute on chronic renal failure   Atrial fibrillation    Time spent: 35 min    Kelvin Cellar  [facility] Pager [phone]. If 7PM-7AM, please contact night-coverage at [email or web address], password [facility] 02/08/2015, 1:31 PM  LOS: 5 days

## 2015-02-08 NOTE — Progress Notes (Addendum)
SUBJECTIVE:  Very sleepy  OBJECTIVE:   Vitals:   Filed Vitals:   02/07/15 2115 02/07/15 2204 02/08/15 0510 02/08/15 0640  BP: 177/61 161/48 160/64   Pulse:   70   Temp:   98.1 F (36.7 C)   TempSrc:   Oral   Resp:   18   Height:      Weight:    160 lb 7.9 oz (72.8 kg)  SpO2:   96%    I&O's:   Intake/Output Summary (Last 24 hours) at 02/08/15 0751 Last data filed at 02/08/15 0357  Gross per 24 hour  Intake     30 ml  Output   1650 ml  Net  -1620 ml   TELEMETRY: Reviewed telemetry pt in NSR:     PHYSICAL EXAM General: Well developed, well nourished, in no acute distress Head: Eyes PERRLA, No xanthomas.   Normal cephalic and atramatic  Lungs:   Clear bilaterally to auscultation and percussion. Heart:   HRRR S1 S2 Pulses are 2+ & equal. Abdomen: Bowel sounds are positive, abdomen soft and non-tender without masses Extremities:   No clubbing, cyanosis.  DP +1 Trace edema Neuro: Alert and oriented X 3. Psych:  Good affect, responds appropriately   LABS: Basic Metabolic Panel:  Recent Labs  02/07/15 0445 02/08/15 0430  NA 138 138  K 4.0 3.6  CL 105 104  CO2 26 26  GLUCOSE 183* 117*  BUN 58* 47*  CREATININE 2.89* 2.43*  CALCIUM 8.3* 8.2*   Liver Function Tests:  Recent Labs  02/06/15 0430  AST 54*  ALT 92*  ALKPHOS 67  BILITOT 0.8  PROT 5.5*  ALBUMIN 2.5*   No results for input(s): LIPASE, AMYLASE in the last 72 hours. CBC:  Recent Labs  02/06/15 0430 02/08/15 0430  WBC 12.5* 11.7*  HGB 9.0* 9.1*  HCT 28.1* 28.3*  MCV 97.6 96.9  PLT 177 177   Cardiac Enzymes: No results for input(s): CKTOTAL, CKMB, CKMBINDEX, TROPONINI in the last 72 hours. BNP: Invalid input(s): POCBNP D-Dimer: No results for input(s): DDIMER in the last 72 hours. Hemoglobin A1C: No results for input(s): HGBA1C in the last 72 hours. Fasting Lipid Panel: No results for input(s): CHOL, HDL, LDLCALC, TRIG, CHOLHDL, LDLDIRECT in the last 72 hours. Thyroid Function  Tests: No results for input(s): TSH, T4TOTAL, T3FREE, THYROIDAB in the last 72 hours.  Invalid input(s): FREET3 Anemia Panel: No results for input(s): VITAMINB12, FOLATE, FERRITIN, TIBC, IRON, RETICCTPCT in the last 72 hours. Coag Panel:   Lab Results  Component Value Date   INR 1.22 02/06/2015   INR 1.55* 06/13/2012   INR 1.06 06/12/2012    RADIOLOGY: Dg Chest Port 1 View  02/05/2015   CLINICAL DATA:  Intubation.  EXAM: PORTABLE CHEST - 1 VIEW  COMPARISON:  02/04/2015.  FINDINGS: Endotracheal tube, left IJ line, NG tube in stable position. Right carotid stent again noted in stable position. Mediastinum and hilar structures are normal. Prior cardiac valve replacement. Stable cardiomegaly. Persistent low lung volumes with bibasilar atelectasis and/or infiltrates. Small pleural effusions. No pneumothorax.  IMPRESSION: 1. Lines and tubes in stable position. Right carotid stent in stable position. 2. Persistent bibasilar atelectasis and/or infiltrates. Small pleural effusions. 3. Prior cardiac valve replacement.  Stable cardiomegaly.   Electronically Signed   By: [place]   On: 02/05/2015 07:32   Dg Chest Port 1 View  02/04/2015   CLINICAL DATA:  Hypoxia  EXAM: PORTABLE CHEST - 1 VIEW  COMPARISON:  Study obtained earlier in the day  FINDINGS: Endotracheal tube tip is 1.8 cm above the carina. Central catheter tip is at the junction of the left innominate vein and superior vena cava. No pneumothorax. There is consolidation in the left lower lobe. There is mild atelectasis in the right mid lung. Lungs elsewhere are clear. Heart is upper normal in size with pulmonary vascular within normal limits. The patient has an aortic valve replacement. The patient is also status post coronary artery bypass grafting. There is a right carotid region stent.  IMPRESSION: Tube and catheter positions as described without pneumothorax. Increased opacity consistent consolidation left lower lobe. Stable atelectasis  right midlung. No change in cardiac silhouette.   Electronically Signed   By: Lowella Grip III M.D.   On: 02/04/2015 14:11   Dg Chest Port 1 View  02/04/2015   CLINICAL DATA:  Heart failure, pulmonary edema  EXAM: PORTABLE CHEST - 1 VIEW  COMPARISON:  02/04/2015  FINDINGS: Stable postop changes of coronary bypass, aortic valve replacement, and right carotid stent. Defibrillator pads overlie the left chest. Stable cardiomegaly without superimposed edema or CHF. Low lung volumes persist with streaky bibasilar atelectasis. No significant interval change. No developing or enlarging effusion. Negative for pneumothorax. Trachea is midline. Aortic atherosclerosis evident.  IMPRESSION: Stable postoperative findings  Stable cardiomegaly with low lung volumes and bibasilar atelectasis.  Aortic atherosclerosis   Electronically Signed   By: Jerilynn Mages.  Shick M.D.   On: 02/04/2015 07:53   Dg Chest Port 1 View  02/04/2015   CLINICAL DATA:  Acute onset of hypoxia.  Initial encounter.  EXAM: PORTABLE CHEST - 1 VIEW  COMPARISON:  Chest radiograph performed 02/03/2015  FINDINGS: The lungs are hypoexpanded. Mild bibasilar opacities may reflect mild interstitial edema or pneumonia. No pleural effusion or pneumothorax is seen.  The cardiomediastinal silhouette is mildly enlarged. The patient is status post median sternotomy, with evidence of prior CABG. An aortic valve replacement is noted. No acute osseous abnormalities are identified. A vascular stent is noted along the right side of the neck.  IMPRESSION: Lungs hypoexpanded. Mild bibasilar opacities may reflect mild interstitial edema or pneumonia. Mild cardiomegaly.   Electronically Signed   By: Garald Balding M.D.   On: 02/04/2015 03:26   Dg Abd Portable 1v  02/04/2015   CLINICAL DATA:  Check nasogastric catheter placement  EXAM: PORTABLE ABDOMEN - 1 VIEW  COMPARISON:  None.  FINDINGS: Scattered large and small bowel gas is noted. A lamellated gallstone is noted in the right  upper quadrant. A nasogastric catheter is seen within the mid stomach. No other focal abnormality is noted.  IMPRESSION: Nasogastric catheter within the mid stomach.   Electronically Signed   By: Inez Catalina M.D.   On: 02/04/2015 14:58   ASSESSMENT AND PLAN:   1. Sinus bradycardia - sinus node dysfunction with junctional bradycardia - ? Related to amiodarone. The patient presented with symptomatic sinus bradycardia in the setting of underlying conduction system disease and pre-syncope. His amiodarone and metoprolol have been held and HR now in the 60's.    2. Atrial fibrillation Identified immediately post op CABG 2013 He has had no documented atrial fibrillation since Would not resume amiodarone unless he has recurrent documented AF If he has recurrent AF, will need to consider [place] with CHADS2VASC of at least 6  3. CAD No recent ischemic symptoms Will discuss with Dr Gwenlyn Found who knows him well need for long-term beta blocker therapy for CAD as this may impact  decision about permanent pacemaker.  Would opt for no further BB and avoid risks associated with PPM.  4. Aortic stenosis s/p AVR Echocardiogram yesterday with normal valve function  5. Chronic diastolic CHF with LE edema - he is on Lasix at home and had CKD.  IV Lasix restarted yesterday and leg edema improved slightly.  Weight is down 8 lbs. Still has significant scrotal edema. Renal function stable.  He is net neg 2L.   Sueanne Margarita, MD  02/08/2015  7:51 AM

## 2015-02-09 DIAGNOSIS — I5033 Acute on chronic diastolic (congestive) heart failure: Secondary | ICD-10-CM

## 2015-02-09 LAB — GLUCOSE, CAPILLARY
GLUCOSE-CAPILLARY: 50 mg/dL — AB (ref 65–99)
GLUCOSE-CAPILLARY: 122 mg/dL — AB (ref 65–99)
Glucose-Capillary: 107 mg/dL — ABNORMAL HIGH (ref 65–99)
Glucose-Capillary: 47 mg/dL — ABNORMAL LOW (ref 65–99)
Glucose-Capillary: 48 mg/dL — ABNORMAL LOW (ref 65–99)
Glucose-Capillary: 58 mg/dL — ABNORMAL LOW (ref 65–99)
Glucose-Capillary: 146 mg/dL — ABNORMAL HIGH (ref 65–99)
Glucose-Capillary: 157 mg/dL — ABNORMAL HIGH (ref 65–99)

## 2015-02-09 LAB — CBC
HCT: 27.5 % — ABNORMAL LOW (ref 39.0–52.0)
HEMOGLOBIN: 8.9 g/dL — AB (ref 13.0–17.0)
MCH: 31.2 pg (ref 26.0–34.0)
MCHC: 32.4 g/dL (ref 30.0–36.0)
MCV: 96.5 fL (ref 78.0–100.0)
Platelets: 175 10*3/uL (ref 150–400)
RBC: 2.85 MIL/uL — ABNORMAL LOW (ref 4.22–5.81)
RDW: 17.5 % — ABNORMAL HIGH (ref 11.5–15.5)
WBC: 15.8 10*3/uL — AB (ref 4.0–10.5)

## 2015-02-09 LAB — CULTURE, BLOOD (ROUTINE X 2)
Culture: NO GROWTH
Culture: NO GROWTH

## 2015-02-09 LAB — BASIC METABOLIC PANEL
ANION GAP: 8 (ref 5–15)
BUN: 35 mg/dL — ABNORMAL HIGH (ref 6–20)
CHLORIDE: 104 mmol/L (ref 101–111)
CO2: 26 mmol/L (ref 22–32)
Calcium: 8.2 mg/dL — ABNORMAL LOW (ref 8.9–10.3)
Creatinine, Ser: 2.21 mg/dL — ABNORMAL HIGH (ref 0.61–1.24)
GFR calc Af Amer: 29 mL/min — ABNORMAL LOW (ref >60)
GFR calc non Af Amer: 25 mL/min — ABNORMAL LOW (ref >60)
Glucose, Bld: 65 mg/dL (ref 65–99)
Potassium: 3.3 mmol/L — ABNORMAL LOW (ref 3.5–5.1)
SODIUM: 138 mmol/L (ref 135–145)

## 2015-02-09 LAB — TROPONIN I: Troponin I: 0.08 ng/mL — ABNORMAL HIGH (ref <0.031)

## 2015-02-09 MED ORDER — POTASSIUM CHLORIDE CRYS ER 20 MEQ PO TBCR
20.0000 meq | EXTENDED_RELEASE_TABLET | Freq: Two times a day (BID) | ORAL | Status: AC
  Administered 2015-02-09 (×2): 20 meq via ORAL
  Filled 2015-02-09 (×2): qty 1

## 2015-02-09 MED ORDER — ISOSORBIDE MONONITRATE ER 60 MG PO TB24
60.0000 mg | ORAL_TABLET | Freq: Every day | ORAL | Status: DC
  Administered 2015-02-09 – 2015-02-11 (×3): 60 mg via ORAL
  Filled 2015-02-09 (×3): qty 1

## 2015-02-09 MED ORDER — GLUCOSE 40 % PO GEL
ORAL | Status: AC
  Administered 2015-02-09: 37.5 g
  Filled 2015-02-09: qty 1

## 2015-02-09 NOTE — Progress Notes (Signed)
Patient: Collin Pearson / Admit Date: 02/03/2015 / Date of Encounter: 02/09/2015, 8:25 AM   Subjective: Feels the best he has for a long time. Scrotal edema improving. LEE improving. Says he had an episode of CP yesterday all day long and told someone about it but daughter says this is the first she'd heard of it. Notes indicate some confusion, sleepiness yesterday.   Objective: Telemetry: NSR Physical Exam: Blood pressure 171/58, pulse 72, temperature 97.7 F (36.5 C), temperature source Oral, resp. rate 18, height 5\' 5"  (1.651 m), weight 159 lb 14.4 oz (72.53 kg), SpO2 95 %. General: Well developed, well nourished WM, in no acute distress. Head: Normocephalic, atraumatic, sclera non-icteric, no xanthomas, nares are without discharge. Neck: Negative for carotid bruits. JVP not elevated. Lungs: Clear bilaterally to auscultation without wheezes, rales, or rhonchi. Breathing is unlabored. Heart: RRR S1 S2 without murmurs, rubs, or gallops.  Abdomen: Soft, non-tender, non-distended with normoactive bowel sounds. No rebound/guarding. Extremities: No clubbing or cyanosis. 1+ LE pitting edema. DP 1+. Neuro: Alert and oriented X 3. Moves all extremities spontaneously. Psych:  Responds to questions appropriately with a normal affect.   Intake/Output Summary (Last 24 hours) at 02/09/15 0825 Last data filed at 02/09/15 0618  Gross per 24 hour  Intake     20 ml  Output   1175 ml  Net  -1155 ml    Inpatient Medications:  . furosemide  60 mg Intravenous Daily  . heparin  5,000 Units Subcutaneous 3 times per day  . hydrALAZINE  50 mg Oral TID  . insulin aspart  0-15 Units Subcutaneous TID WC  . insulin aspart  0-5 Units Subcutaneous QHS  . isosorbide mononitrate  60 mg Oral Daily  . pantoprazole  40 mg Oral QHS  . sodium chloride  10-40 mL Intracatheter Q12H   Infusions:    Labs:  Recent Labs  02/08/15 0430 02/09/15 0425  NA 138 138  K 3.6 3.3*  CL 104 104  CO2 26 26  GLUCOSE  117* 65  BUN 47* 35*  CREATININE 2.43* 2.21*  CALCIUM 8.2* 8.2*   No results for input(s): AST, ALT, ALKPHOS, BILITOT, PROT, ALBUMIN in the last 72 hours.  Recent Labs  02/08/15 0430 02/09/15 0425  WBC 11.7* 15.8*  HGB 9.1* 8.9*  HCT 28.3* 27.5*  MCV 96.9 96.5  PLT 177 175   No results for input(s): CKTOTAL, CKMB, TROPONINI in the last 72 hours. Invalid input(s): POCBNP No results for input(s): HGBA1C in the last 72 hours.   Radiology/Studies:  Dg Chest Port 1 View  02/05/2015   CLINICAL DATA:  Intubation.  EXAM: PORTABLE CHEST - 1 VIEW  COMPARISON:  02/04/2015.  FINDINGS: Endotracheal tube, left IJ line, NG tube in stable position. Right carotid stent again noted in stable position. Mediastinum and hilar structures are normal. Prior cardiac valve replacement. Stable cardiomegaly. Persistent low lung volumes with bibasilar atelectasis and/or infiltrates. Small pleural effusions. No pneumothorax.  IMPRESSION: 1. Lines and tubes in stable position. Right carotid stent in stable position. 2. Persistent bibasilar atelectasis and/or infiltrates. Small pleural effusions. 3. Prior cardiac valve replacement.  Stable cardiomegaly.   Electronically Signed   By: Marcello Moores  Register   On: 02/05/2015 07:32   Dg Chest Port 1 View  02/04/2015   CLINICAL DATA:  Hypoxia  EXAM: PORTABLE CHEST - 1 VIEW  COMPARISON:  Study obtained earlier in the day  FINDINGS: Endotracheal tube tip is 1.8 cm above the carina. Central catheter tip is  at the junction of the left innominate vein and superior vena cava. No pneumothorax. There is consolidation in the left lower lobe. There is mild atelectasis in the right mid lung. Lungs elsewhere are clear. Heart is upper normal in size with pulmonary vascular within normal limits. The patient has an aortic valve replacement. The patient is also status post coronary artery bypass grafting. There is a right carotid region stent.  IMPRESSION: Tube and catheter positions as described  without pneumothorax. Increased opacity consistent consolidation left lower lobe. Stable atelectasis right midlung. No change in cardiac silhouette.   Electronically Signed   By: Lowella Grip III M.D.   On: 02/04/2015 14:11   Dg Chest Port 1 View  02/04/2015   CLINICAL DATA:  Heart failure, pulmonary edema  EXAM: PORTABLE CHEST - 1 VIEW  COMPARISON:  02/04/2015  FINDINGS: Stable postop changes of coronary bypass, aortic valve replacement, and right carotid stent. Defibrillator pads overlie the left chest. Stable cardiomegaly without superimposed edema or CHF. Low lung volumes persist with streaky bibasilar atelectasis. No significant interval change. No developing or enlarging effusion. Negative for pneumothorax. Trachea is midline. Aortic atherosclerosis evident.  IMPRESSION: Stable postoperative findings  Stable cardiomegaly with low lung volumes and bibasilar atelectasis.  Aortic atherosclerosis   Electronically Signed   By: Jerilynn Mages.  Shick M.D.   On: 02/04/2015 07:53   Dg Chest Port 1 View  02/04/2015   CLINICAL DATA:  Acute onset of hypoxia.  Initial encounter.  EXAM: PORTABLE CHEST - 1 VIEW  COMPARISON:  Chest radiograph performed 02/03/2015  FINDINGS: The lungs are hypoexpanded. Mild bibasilar opacities may reflect mild interstitial edema or pneumonia. No pleural effusion or pneumothorax is seen.  The cardiomediastinal silhouette is mildly enlarged. The patient is status post median sternotomy, with evidence of prior CABG. An aortic valve replacement is noted. No acute osseous abnormalities are identified. A vascular stent is noted along the right side of the neck.  IMPRESSION: Lungs hypoexpanded. Mild bibasilar opacities may reflect mild interstitial edema or pneumonia. Mild cardiomegaly.   Electronically Signed   By: Garald Balding M.D.   On: 02/04/2015 03:26   Dg Abd Portable 1v  02/04/2015   CLINICAL DATA:  Check nasogastric catheter placement  EXAM: PORTABLE ABDOMEN - 1 VIEW  COMPARISON:  None.   FINDINGS: Scattered large and small bowel gas is noted. A lamellated gallstone is noted in the right upper quadrant. A nasogastric catheter is seen within the mid stomach. No other focal abnormality is noted.  IMPRESSION: Nasogastric catheter within the mid stomach.   Electronically Signed   By: Inez Catalina M.D.   On: 02/04/2015 14:58     Assessment and Plan  70M with CAD s/p 1V CABG/stenting, s/p AVR with bypass pieces, CKD, HTN, DM, PVD admitted with presyncope and HR in the 20s requiring transcutaneous pacing in the setting of hyperkalemia with K 5.8.   1. Sinus bradycardia - sinus node dysfunction, underlying conduction disease with junctional bradycardia - ? Related to amiodarone. The patient presented with symptomatic sinus bradycardia in the setting of underlying conduction system disease and pre-syncope. His amiodarone and metoprolol have been held and HR now in the 60's.I have sent a message to Dr. Gwenlyn Found this morning - notes over the weekend imply discussion with Dr. Gwenlyn Found since he knows him well regarding long-term beta blocker therapy for CAD as this may impact decision about permanent pacemaker.May be prudent to opt for no further BB and avoid risks associated with PPM.  2.  H/o post-op atrial fibrillation -Identified immediately post op CABG 2013. He has had no documented atrial fibrillation since Would not resume amiodarone unless he has recurrent documented AF. If he has recurrent AF, will need to consider [place] with CHADS2VASC of at least 6.  3. CAD - Resume daily ASA when OK with IM. He reports an episode of chest pain yesterday that lasted all day long. He says he told staff about it but there are no notes about this and his daughter says he hadn't mentioned anything. He is now chest pain free. Check troponin to trend.  4. Aortic stenosis s/p AVR - echocardiogram this admission with normal valve function.  5. Acute on chronic diastolic CHF with LE edema - weight down 11lb. Cr  improved with diuresis. LEE still present and weight not yet back to baseline. Continue IV Lasix. Supplement KCL 37meq BID x 2 doses today and reassess in AM. Will not add standing KCl given hyperkalemia on admission.  6. Leukocytosis/anemia - per IM.  7. HTN - IM has increased Imdur to 60mg  daily. If need be would increase hydralazine to 75mg  TID.  8. AKI on CKD stage III - Cr continues to improve. Follow with above.  Signed, Melina Copa PA-C Pager: [phone]   Patient seen and examined and history reviewed. Agree with above findings and plan. Very pleasant gentleman. Doing well today. Feels much better. Ambulating some with walker and assistance. PT evaluation pending. He has diuresed 11 lbs. Edema is improved. He is diuresing over 1 liter/day.  Renal function is better. I would favor continuing current IV diuretic dose. Rhythm is very stable off metoprolol and amiodarone. No AV block or significant brady. I would not resume metoprolol. He did have mild chest ache yesterday all day. This sounds atypical for angina.   Richetta Cubillos [place], [place] 02/09/2015 9:52 AM

## 2015-02-09 NOTE — Progress Notes (Signed)
Inpatient Diabetes Program Recommendations  AACE/ADA: New Consensus Statement on Inpatient Glycemic Control (2013)  Target Ranges:  Prepandial:   less than 140 mg/dL      Peak postprandial:   less than 180 mg/dL (1-2 hours)      Critically ill patients:  140 - 180 mg/dL   Results for OSIAH, HYE (MRN [id number]) as of 02/09/2015 10:31  Ref. Range 02/08/2015 06:17 02/08/2015 11:20 02/08/2015 16:38 02/08/2015 21:30 02/09/2015 06:28 02/09/2015 06:31 02/09/2015 06:45 02/09/2015 06:48 02/09/2015 07:01  Glucose-Capillary Latest Ref Range: 65-99 mg/dL 116 (H) 258 (H) 137 (H) 168 (H) 50 (L) 48 (L) 47 (L) 58 (L) 107 (H)   Current orders for Inpatient glycemic control: Novolog 0-15 units TID with meals, Novolog 0-5 units HS  Inpatient Diabetes Program Recommendations Insulin - Basal: Noted patient received Lantus 16 units last night and fasting glucose 47 mg/dl this morning. Therefore Lantus was discontinued. Recommend reordering Lantus at a lower dose; recommend ordering Lantus 5 units QHS.  Thanks, Barnie Alderman, RN, MSN, CCRN, CDE Diabetes Coordinator Inpatient Diabetes Program [phone] (Team Pager from [place] to [place]) [phone] ([facility] office) [phone] [facility] office) [phone] [facility] office)

## 2015-02-09 NOTE — Progress Notes (Signed)
Hypoglycemic Event  CBG: 48  Treatment: glucose gel  Symptoms:pt aymptomatic  Follow-up CBG: [id number] CBG Result: 58  Treatment:  7 oz Sprite CBG Result:  107      Collin Pearson B

## 2015-02-09 NOTE — Progress Notes (Addendum)
Dr. Gwenlyn Found also assessed patient. He favors keeping the patient off BB and amiodarone and following closely as outpatient. He recommends to transition to oral Lasix tomorrow, watch 48-72 hours, get PT to see, and arrange 7 day f/u visit at discharge. He says he would not put in a PPM unless absolutely necessary. Also note troponin has downtrended from prior. Observe for recurrent chest pain. Jewell Haught PA-C

## 2015-02-09 NOTE — Evaluation (Signed)
Occupational Therapy Evaluation Patient Details Name: Collin Pearson MRN: [id number] DOB: [birth year] Today's Date: 02/09/2015    History of Present Illness Admitted 6/21 with pre-syncope (also had fallen 2 days PTA), found to be bradycardic; developed hypotension and required intubation 6/22-6/23 with pressors. Awaiting PPM  PMHx- CABG, AVR, clotting disorder, DM, HTN, PVD, CKD   Clinical Impression   Pt admitted with above. Pt independent with ADLs, PTA. Feel pt will benefit from acute OT to increase independence and strength prior to d/c. Recommending SNF for rehab.    Follow Up Recommendations  SNF;Supervision/Assistance - 24 hour    Equipment Recommendations  Other (comment) (defer to next venue)    Recommendations for Other Services       Precautions / Restrictions Precautions Precautions: Fall Restrictions Weight Bearing Restrictions: No      Mobility Bed Mobility               General bed mobility comments: not assessed-pt in chair  Transfers Overall transfer level: Needs assistance Equipment used: Rolling walker (2 wheeled) Transfers: Sit to/from Stand Sit to Stand: Mod assist         General transfer comment: cues for hand placement. Assist to boost.    Balance Overall balance assessment: Needs assistance      Pt with LOB at sink during functional tasks requiring Min assist (2-3 times).        Standing balance comment: posterior lean                            ADL Overall ADL's : Needs assistance/impaired     Grooming: Wash/dry hands;Wash/dry face;Standing;Minimal assistance Grooming Details (indicate cue type and reason): assist for balance     Lower Body Bathing: Moderate assistance;Sit to/from stand       Lower Body Dressing: Maximal assistance;Sit to/from stand;Moderate assistance   Toilet Transfer: Min guard;Ambulation;RW;Moderate assistance (assist for sit to stand from chair)   Toileting- Clothing Manipulation  and Hygiene: Minimal assistance;Sit to/from stand       Functional mobility during ADLs: Min guard;Rolling walker General ADL Comments: Recommended pt elevate scrotum due to edema-OT attempted to do so, but was painful trying to adjust, so recommended he elevate when in bed.      Vision  Pt wears glasses.   Perception     Praxis      Pertinent Vitals/Pain Pain Assessment: No/denies pain     Hand Dominance Right   Extremity/Trunk Assessment Upper Extremity Assessment Upper Extremity Assessment: Generalized weakness;RUE deficits/detail RUE Deficits / Details: decreased AROM right shoulder flexion due to previous injury, per pt report   Lower Extremity Assessment Lower Extremity Assessment: Defer to PT evaluation       Communication Communication Communication: HOH   Cognition Arousal/Alertness: Awake/alert Behavior During Therapy: WFL for tasks assessed/performed Overall Cognitive Status: No family/caregiver present to determine baseline cognitive functioning Area of Impairment: Orientation Orientation Level: Disoriented to;Place                 General Comments       Exercises       Shoulder Instructions      Home Living Family/patient expects to be discharged to:: Private residence Living Arrangements: Spouse/significant other Available Help at Discharge: Family;Available PRN/intermittently Type of Home: House Home Access: Stairs to enter [employer] of Steps: 3 Entrance Stairs-Rails: Right;Left Home Layout: One level     Bathroom Shower/Tub: Teacher, early years/pre: Standard  Home Equipment: [place] - 2 wheels;Cane - single point   Additional Comments: information taken from previous record; pt reports someone is not with him 24/7 at home  Lives With: Spouse    Prior Functioning/Environment Level of Independence: Independent with assistive device(s)        Comments: uses cane vs RW depending on how he feels     OT Diagnosis: Generalized weakness   OT Problem List: Decreased strength;Decreased cognition;Impaired balance (sitting and/or standing);Decreased activity tolerance;Decreased range of motion;Pain;Increased edema;Decreased knowledge of precautions;Decreased knowledge of use of DME or AE   OT Treatment/Interventions: Self-care/ADL training;DME and/or AE instruction;Therapeutic activities;Patient/family education;Balance training;Cognitive remediation/compensation;Energy conservation;Therapeutic exercise    OT Goals(Current goals can be found in the care plan section) Acute Rehab OT Goals 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  OT Frequency: Min 2X/week   Barriers to D/C:            Co-evaluation              End of Session Equipment Utilized During Treatment: Gait belt;Rolling walker  Activity Tolerance: Patient tolerated treatment well Patient left: in chair;with call bell/phone within reach   Time: 1022-1041 OT Time Calculation (min): 19 min Charges:  OT General Charges $OT Visit: 1 Procedure OT Evaluation $Initial OT Evaluation Tier I: 1 Procedure G-CodesBenito Mccreedy OTR/L [id number] 02/09/2015, 12:44 PM

## 2015-02-09 NOTE — Care Management (Signed)
Important Message  Patient Details  Name: Collin Pearson MRN: [id number] Date of Birth: [birth year]   Medicare Important Message Given:  Yes-second notification given    Nathen May 02/09/2015, 1:11 PM

## 2015-02-09 NOTE — Progress Notes (Signed)
Collin Pearson is a long-term patient of mine admitted with symptomatic bradycardia and volume overload. He was on amiodarone and beta blocker. These have been held and his heart rate has nicely come up to the 70s in sinus rhythm. His vital signs are stable. He has diuresed 11 pounds and still has mild pitting edema. He is fairly frail and lives alone. I favor keeping him off those 2 drugs and following him closely as an outpatient. I would be hesitant to put in a permanent transvenous pacemaker unless absolute necessary. Consider transitioning him to oral Lasix, following his I's and O's, renal function. He will need aggressive inpatient physical therapy and determination at discharge about whether to do out patient rehabilitation versus home PT and home health. I favor the latter. He will need TOC 7.  Lorretta Harp, M.D., [place], [facility], Laverta [place] [address] [place], [place]  [id number]  [phone] 02/09/2015 11:36 AM

## 2015-02-09 NOTE — Progress Notes (Signed)
Physical Therapy Treatment Patient Details Name: Collin Pearson MRN: [id number] DOB: Jun 16, 1927 Today's Date: 02/09/2015    History of Present Illness Admitted 6/21 with pre-syncope (also had fallen 2 days PTA), found to be bradycardic; developed hypotension and required intubation 6/22-6/23 with pressors. MD stopped several cardiac meds and does not plan on PPM. PMHx- CABG, AVR, clotting disorder, DM, HTN, PVD, CKD    PT Comments    Pt progressing well with good tolerance for ambulation. Continues with imbalance (as PTA) and agree with HHPT until pt progresses to return to OPPT. Daughter from [place] present and reports she will stay with pt to provide 24 hr assist (along with pt's wife who works).    Follow Up Recommendations  Home health PT;Supervision/Assistance - 24 hour     Equipment Recommendations  None recommended by PT    Recommendations for Other Services       Precautions / Restrictions Precautions Precautions: Fall Restrictions Weight Bearing Restrictions: No    Mobility  Bed Mobility Overal bed mobility: Needs Assistance Bed Mobility: Rolling;Sidelying to Sit;Sit to Sidelying Rolling: Min assist Sidelying to sit: Min assist     Sit to sidelying: Mod assist General bed mobility comments: pt initially tried to raise from supine to sit by throwing legs off EOB (unsuccessful); HOB 0, +rail with vc for technique to pass through sidelying both out and into bed  Transfers Overall transfer level: Needs assistance Equipment used: Rolling walker (2 wheeled) Transfers: Sit to/from Stand Sit to Stand: Min guard         General transfer comment: cues for hand placement. moves very slowly  Ambulation/Gait Ambulation/Gait assistance: Min guard Ambulation Distance (Feet): 140 Feet Assistive device: Rolling walker (2 wheeled) Gait Pattern/deviations: Step-to pattern;Step-through pattern;Decreased stride length Gait velocity: very slow   General Gait Details:  initial step to pattern with progression to step-through; pt tends to push RW slightly too far ahead (however he currently tends to lose balance posteriorly, so this is one way to keep him forward over his feet   Stairs            Wheelchair Mobility    Modified Rankin (Stroke Patients Only)       Balance Overall balance assessment: Needs assistance;History of Falls Sitting-balance support: No upper extremity supported;Feet supported Sitting balance-Leahy Scale: Fair     Standing balance support: Single extremity supported;During functional activity Standing balance-Leahy Scale: Poor Standing balance comment: performed standing ex at sink with either bil or unilateral UE support without loss of balance                    Cognition Arousal/Alertness: Awake/alert Behavior During Therapy: WFL for tasks assessed/performed Overall Cognitive Status: Within Functional Limits for tasks assessed Area of Impairment: Orientation Orientation Level: Disoriented to;Place                  Exercises General Exercises - Lower Extremity Hip ABduction/ADduction: AROM;Both;5 reps;Standing (bil UE support) Hip Flexion/Marching: AROM;Both;10 reps;Standing (unilateral support) Toe Raises: AAROM;Both;10 reps;Standing Heel Raises: AAROM;Both;10 reps;Standing Mini-Sqauts: AAROM;Both;10 reps;Standing (bil UE support; +posterior lean)    General Comments General comments (skin integrity, edema, etc.): daughter initially present and confirmed home set-up with addition that she will stay with pt to provide 24 assist      Pertinent Vitals/Pain Pain Assessment: No/denies pain    Home Living Family/patient expects to be discharged to:: Private residence Living Arrangements: Spouse/significant other Available Help at Discharge: Family;Available 24 hours/day (daughter from [place] to  stay and assist 24/7) Type of Home: House Home Access: Stairs to enter Entrance Stairs-Rails:  Right;Left Home Layout: One level Home Equipment: Environmental consultant - 2 wheels;Cane - single point Additional Comments: information taken from previous record; pt reports someone is not with him 24/7 at home    Prior Function Level of Independence: Independent with assistive device(s)      Comments: uses cane vs RW depending on how he feels   PT Goals (current goals can now be found in the care plan section) Acute Rehab PT Goals 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

## 2015-02-09 NOTE — Progress Notes (Signed)
[facility] PROGRESS NOTE  Collin Pearson [id number] DOB: 1927-05-22 DOA: 02/03/2015 PCP: Helen Hashimoto., MD  Assessment/Plan: 1. Bradycardia. -Patient presented with hemodynamic compromise in setting of sinus bradycardia, found to have heart rates in the 20s on the field. Transcutaneous pacer had been placed. He was initially started on isoproterenol, then placed on dopamine in the intensive care unit. -It was felt that amiodarone and metoprolol may have led to sinus bradycardia, particularly with decreased clearance in setting of chronic kidney disease. -Patient's heart rates improved with telemetry showing ventricular rates in the 50s to 60s. He is hemodynamically stable, mentating well. -His heart rates remained stable on telemetry.  -Discussed with cardiology, agree that risks of beta blocker and amiodarone therapy outweigh benefits.   2. Fluid overload. -Patient having evidence of volume overload on physical examination with the presence of bilateral extremity pitting edema along with scrotal edema. -Patient's still has evidence of volume overload, has a net negative fluid balance of the 2.16 L, putting out 1650 mL over the past 24 hours. -Kidney function continues to improve -Reviewed ins and out, has a net negative fluid balance of 3.138 L  -Will continue IV lasix for 1 more day  3.  Acute on chronic renal failure. -Likely secondary to decreased cardiac output and hypotension in setting of severe bradycardia. -patient has a history of stage IV chronic kidney disease, appears to have a baseline creatinine near 2.5-2.8 -Initially presented with creatinine of 3.41, improving to 2.89 on a.m. Lab work -Creatinine continues to improve; creatinine trending down to 2.21 from 2.4 -Continue IV lasix 60 mg daily for 1 more day  4.  History of atrial fibrillation with a CHADVasc score of 6 -Patient previously on amiodarone and metoprolol, both agents have been discontinued  due to profound bradycardia. -Follow cardiology recommendations regarding anticoagulation.  5.  History of type 2 diabetes mellitus. -patient had been on glipizide 10 mg at home -Started Lantus 12 units subcutaneous daily at bedtime, cover with sliding scale insulin with meals and at bedtime -Blood sugars for the most part remaining in the 100 range  6.  Hypertension. -Patient's blood pressures now elevated, having systolic blood pressures in the 160s to 170s -Metoprolol has been discontinued. Added imdur 15 mg PO q daily to hydralazine 50 mg PO TID -Blood pressures remain elevated, have increased Imdur to 60 mg PO q daily today  Code Status: full code Family Communication: spoke with family members present at bedside Disposition Plan: Physical therapy consultation   Consultants:  Cardiology  Pulmonary medicine    HPI/Subjective: Patient is an 81 year old gentleman with a past medical history of hypertension, coronary disease, aortic stenosis, type 2 diabetes mellitus, who had been on amiodarone and metoprolol, he reported feeling presyncopal, EMS called, found to be bradycardic having a heart rate in the 20s on the field. A transcutaneous pacer was placed in route to [facility]. In the emergency department at outside facility he was started on isoproterenol and admitted to the intensive care unit under the care of pulmonary critical care medicine. Patient underwent rapid sequence intubation and started on IV dopamine and levo fed.Cardiology was consulted as patient was evaluated by Dr. Caryl Comes. It was felt that bradycardia could have resulted from amiodarone and metoprolol particularly with decreased clearance given history of renal failure. Patient was weaned off of dopamine and extubated on 02/05/2015. Heart rates improving.a transthoracic echocardiogram was performed on 02/04/2015 that showed an EF of 60-65%. Patient was transferred to 2 W. On  02/06/2015.  Objective: Filed  Vitals:   02/09/15 1333  BP: 157/61  Pulse: 77  Temp: 98.5 F (36.9 C)  Resp: 17    Intake/Output Summary (Last 24 hours) at 02/09/15 1633 Last data filed at 02/09/15 1333  Gross per 24 hour  Intake    490 ml  Output   1000 ml  Net   -510 ml   Filed Weights   02/07/15 1646 02/08/15 0640 02/09/15 0453  Weight: 74.2 kg (163 lb 9.3 oz) 72.8 kg (160 lb 7.9 oz) 72.53 kg (159 lb 14.4 oz)    Exam:   General:  Patient is confused however awake and alert, mentating well, he was ambulated to hallway and back, seems a little stronger today  Cardiovascular: regular rate and rhythm normal S1-S2 no murmurs rubs or gallops  Respiratory: normal respiratory effort, has a few bibasilar crackles  Abdomen: soft nontender nondistended  Musculoskeletal: he has 1+ bilateral extremity pitting edema along with presence of scrotal and penile edema  Data Reviewed: Basic Metabolic Panel:  Recent Labs Lab 02/03/15 2244 02/04/15 0436 02/04/15 1229 02/05/15 0524 02/06/15 0430 02/07/15 0445 02/08/15 0430 02/09/15 0425  NA 134* 133* 132* 136 138 138 138 138  K 4.0 4.0 4.4 3.9 4.5 4.0 3.6 3.3*  CL 99* 99* 98* 101 102 105 104 104  CO2 19* 20* 21* 24 23 26 26 26   GLUCOSE 283* 302* 191* 72 109* 183* 117* 65  BUN 77* 73* 72* 64* 66* 58* 47* 35*  CREATININE 3.63* 3.70* 3.82* 3.41* 3.39* 2.89* 2.43* 2.21*  CALCIUM 8.2* 8.2* 8.4* 8.9 8.7* 8.3* 8.2* 8.2*  MG 2.2 2.2 2.3 2.2  --   --   --   --   PHOS 3.9 4.1 4.0 2.9  --   --   --   --    Liver Function Tests:  Recent Labs Lab 02/03/15 2244 02/06/15 0430  AST 103* 54*  ALT 104* 92*  ALKPHOS 72 67  BILITOT 0.7 0.8  PROT 5.2* 5.5*  ALBUMIN 2.7* 2.5*   No results for input(s): LIPASE, AMYLASE in the last 168 hours. No results for input(s): AMMONIA in the last 168 hours. CBC:  Recent Labs Lab 02/04/15 0436 02/05/15 0524 02/06/15 0430 02/08/15 0430 02/09/15 0425  WBC 11.9* 14.1* 12.5* 11.7* 15.8*  HGB 8.2* 10.1* 9.0* 9.1* 8.9*  HCT  24.9* 30.7* 28.1* 28.3* 27.5*  MCV 94.3 94.5 97.6 96.9 96.5  PLT 143* 192 177 177 175   Cardiac Enzymes:  Recent Labs Lab 02/03/15 2244 02/04/15 0436 02/04/15 1229 02/09/15 1014  TROPONINI 0.04* 0.07* 0.13* 0.08*   BNP (last 3 results) No results for input(s): BNP in the last 8760 hours.  ProBNP (last 3 results) No results for input(s): PROBNP in the last 8760 hours.  CBG:  Recent Labs Lab 02/09/15 0645 02/09/15 0648 02/09/15 0701 02/09/15 1134 02/09/15 1605  GLUCAP 47* 58* 107* 122* 146*    Recent Results (from the past 240 hour(s))  MRSA PCR Screening     Status: None   Collection Time: 02/03/15  7:07 PM  Result Value Ref Range Status   MRSA by PCR NEGATIVE NEGATIVE Final    Comment:        The GeneXpert MRSA Assay (FDA approved for NASAL specimens only), is one component of a comprehensive MRSA colonization surveillance program. It is not intended to diagnose MRSA infection nor to guide or monitor treatment for MRSA infections.   Culture, blood (routine x 2)  Status: None   Collection Time: 02/03/15 10:35 PM  Result Value Ref Range Status   Specimen Description BLOOD LEFT ARM  Final   Special Requests BOTTLES DRAWN AEROBIC ONLY 4CC  Final   Culture NO GROWTH 5 DAYS  Final   Report Status 02/09/2015 FINAL  Final  Culture, blood (routine x 2)     Status: None   Collection Time: 02/03/15 10:44 PM  Result Value Ref Range Status   Specimen Description BLOOD LEFT HAND  Final   Special Requests BOTTLES DRAWN AEROBIC ONLY 3CC  Final   Culture NO GROWTH 5 DAYS  Final   Report Status 02/09/2015 FINAL  Final     Studies: No results found.  Scheduled Meds: . furosemide  60 mg Intravenous Daily  . heparin  5,000 Units Subcutaneous 3 times per day  . hydrALAZINE  50 mg Oral TID  . insulin aspart  0-15 Units Subcutaneous TID WC  . insulin aspart  0-5 Units Subcutaneous QHS  . isosorbide mononitrate  60 mg Oral Daily  . pantoprazole  40 mg Oral QHS  .  potassium chloride  20 mEq Oral BID  . sodium chloride  10-40 mL Intracatheter Q12H   Continuous Infusions:    Principal Problem:   Bradycardia Active Problems:   Encounter for nasogastric tube placement   Endotracheally intubated   Hypoxia   Pulmonary edema   Acute on chronic renal failure   Atrial fibrillation   Physical deconditioning   Acute on chronic diastolic CHF (congestive heart failure), NYHA class 1    Time spent: 25 min    Kelvin Cellar  [facility] Pager [phone]. If 7PM-7AM, please contact night-coverage at [email or web address], password [facility] 02/09/2015, 4:33 PM  LOS: 6 days

## 2015-02-10 DIAGNOSIS — N179 Acute kidney failure, unspecified: Secondary | ICD-10-CM

## 2015-02-10 DIAGNOSIS — N189 Chronic kidney disease, unspecified: Secondary | ICD-10-CM

## 2015-02-10 LAB — GLUCOSE, CAPILLARY
GLUCOSE-CAPILLARY: 214 mg/dL — AB (ref 65–99)
GLUCOSE-CAPILLARY: 185 mg/dL — AB (ref 65–99)
GLUCOSE-CAPILLARY: 172 mg/dL — AB (ref 65–99)
Glucose-Capillary: 132 mg/dL — ABNORMAL HIGH (ref 65–99)

## 2015-02-10 LAB — CBC
HCT: 26.7 % — ABNORMAL LOW (ref 39.0–52.0)
Hemoglobin: 8.5 g/dL — ABNORMAL LOW (ref 13.0–17.0)
MCH: 31.3 pg (ref 26.0–34.0)
MCHC: 31.8 g/dL (ref 30.0–36.0)
MCV: 98.2 fL (ref 78.0–100.0)
PLATELETS: 179 10*3/uL (ref 150–400)
RBC: 2.72 MIL/uL — AB (ref 4.22–5.81)
RDW: 17.5 % — ABNORMAL HIGH (ref 11.5–15.5)
WBC: 13.9 10*3/uL — ABNORMAL HIGH (ref 4.0–10.5)

## 2015-02-10 LAB — BASIC METABOLIC PANEL
Anion gap: 7 (ref 5–15)
BUN: 30 mg/dL — AB (ref 6–20)
CHLORIDE: 105 mmol/L (ref 101–111)
CO2: 25 mmol/L (ref 22–32)
Calcium: 7.7 mg/dL — ABNORMAL LOW (ref 8.9–10.3)
Creatinine, Ser: 2.09 mg/dL — ABNORMAL HIGH (ref 0.61–1.24)
GFR calc non Af Amer: 27 mL/min — ABNORMAL LOW (ref >60)
GFR, EST AFRICAN AMERICAN: 31 mL/min — AB (ref >60)
GLUCOSE: 136 mg/dL — AB (ref 65–99)
POTASSIUM: 3.7 mmol/L (ref 3.5–5.1)
Sodium: 137 mmol/L (ref 135–145)

## 2015-02-10 MED ORDER — FUROSEMIDE 80 MG PO TABS
80.0000 mg | ORAL_TABLET | Freq: Every day | ORAL | Status: DC
  Administered 2015-02-10 – 2015-02-11 (×2): 80 mg via ORAL
  Filled 2015-02-10 (×2): qty 1

## 2015-02-10 NOTE — Care Management Note (Addendum)
Case Management Note  Patient Details  Name: LUAL BELAIRE MRN: [id number] Date of Birth: 1927-08-10  Subjective/Objective:  Pt admitted with bradycardia                  Action/Plan:  Pt is from home with wife.  Pt prefers to go home with home health as opposed to SNF.  CM will monitor for disposition needs   Expected Discharge Date:                  Expected Discharge Plan:  Home/Self Care  In-House Referral:     Discharge planning Services     Post Acute Care Choice:    Choice offered to:  Patient, Spouse  DME Arranged:    DME Agency:     HH Arranged:  RN, PT [place] Agency:  [facility]  Status of Service:  Completed, signed off  Medicare Important Message Given:  Yes-second notification given Date Medicare IM Given:    Medicare IM give by:    Date Additional Medicare IM Given:    Additional Medicare Important Message give by:     If discussed at [place] of Stay Meetings, dates discussed:    Additional Comments: CM offered pt choice, pt and wife chose [facility], CM contacted agency and referral was accepted.  Address and home phone number verified against epic.    Wife will provide 24 hour supervision as recommended. Maryclare Labrador, RN 02/10/2015, 4:31 PM

## 2015-02-10 NOTE — Progress Notes (Signed)
Patient: Collin Pearson / Admit Date: 02/03/2015 / Date of Encounter: 02/10/2015, 10:02 AM   Subjective: Feels very well. Scrotal edema improving. LEE improving. No chest pain.    Objective: Telemetry: NSR Physical Exam: Blood pressure 166/59, pulse 72, temperature 98 F (36.7 C), temperature source Oral, resp. rate 18, height 5\' 5"  (1.651 m), weight 70.6 kg (155 lb 10.3 oz), SpO2 94 %. General: Well developed, well nourished WM, in no acute distress. Head: Normocephalic, atraumatic, sclera non-icteric, no xanthomas, nares are without discharge. Neck: Negative for carotid bruits. JVP not elevated. Lungs: Clear bilaterally to auscultation without wheezes, rales, or rhonchi. Breathing is unlabored. Heart: RRR S1 S2 without murmurs, rubs, or gallops.  Abdomen: Soft, non-tender, non-distended with normoactive bowel sounds. No rebound/guarding. Still has some scrotal edema. Extremities: No clubbing or cyanosis. 1+ LE pitting edema. DP 1+.  Neuro: Alert and oriented X 3. Moves all extremities spontaneously. Psych:  Responds to questions appropriately with a normal affect.   Intake/Output Summary (Last 24 hours) at 02/10/15 1002 Last data filed at 02/10/15 0756  Gross per 24 hour  Intake    730 ml  Output   1200 ml  Net   -470 ml    Inpatient Medications:  . furosemide  80 mg Oral Daily  . heparin  5,000 Units Subcutaneous 3 times per day  . hydrALAZINE  50 mg Oral TID  . insulin aspart  0-15 Units Subcutaneous TID WC  . insulin aspart  0-5 Units Subcutaneous QHS  . isosorbide mononitrate  60 mg Oral Daily  . pantoprazole  40 mg Oral QHS  . sodium chloride  10-40 mL Intracatheter Q12H   Infusions:    Labs:  Recent Labs  02/09/15 0425 02/10/15 0425  NA 138 137  K 3.3* 3.7  CL 104 105  CO2 26 25  GLUCOSE 65 136*  BUN 35* 30*  CREATININE 2.21* 2.09*  CALCIUM 8.2* 7.7*   No results for input(s): AST, ALT, ALKPHOS, BILITOT, PROT, ALBUMIN in the last 72 hours.  Recent  Labs  02/09/15 0425 02/10/15 0425  WBC 15.8* 13.9*  HGB 8.9* 8.5*  HCT 27.5* 26.7*  MCV 96.5 98.2  PLT 175 179    Recent Labs  02/09/15 1014  TROPONINI 0.08*   Invalid input(s): POCBNP No results for input(s): HGBA1C in the last 72 hours.   Radiology/Studies:  Dg Chest Port 1 View  02/05/2015   CLINICAL DATA:  Intubation.  EXAM: PORTABLE CHEST - 1 VIEW  COMPARISON:  02/04/2015.  FINDINGS: Endotracheal tube, left IJ line, NG tube in stable position. Right carotid stent again noted in stable position. Mediastinum and hilar structures are normal. Prior cardiac valve replacement. Stable cardiomegaly. Persistent low lung volumes with bibasilar atelectasis and/or infiltrates. Small pleural effusions. No pneumothorax.  IMPRESSION: 1. Lines and tubes in stable position. Right carotid stent in stable position. 2. Persistent bibasilar atelectasis and/or infiltrates. Small pleural effusions. 3. Prior cardiac valve replacement.  Stable cardiomegaly.   Electronically Signed   By: Marcello Moores  Register   On: 02/05/2015 07:32   Dg Chest Port 1 View  02/04/2015   CLINICAL DATA:  Hypoxia  EXAM: PORTABLE CHEST - 1 VIEW  COMPARISON:  Study obtained earlier in the day  FINDINGS: Endotracheal tube tip is 1.8 cm above the carina. Central catheter tip is at the junction of the left innominate vein and superior vena cava. No pneumothorax. There is consolidation in the left lower lobe. There is mild atelectasis in the right mid  lung. Lungs elsewhere are clear. Heart is upper normal in size with pulmonary vascular within normal limits. The patient has an aortic valve replacement. The patient is also status post coronary artery bypass grafting. There is a right carotid region stent.  IMPRESSION: Tube and catheter positions as described without pneumothorax. Increased opacity consistent consolidation left lower lobe. Stable atelectasis right midlung. No change in cardiac silhouette.   Electronically Signed   By: Lowella Grip III M.D.   On: 02/04/2015 14:11   Dg Chest Port 1 View  02/04/2015   CLINICAL DATA:  Heart failure, pulmonary edema  EXAM: PORTABLE CHEST - 1 VIEW  COMPARISON:  02/04/2015  FINDINGS: Stable postop changes of coronary bypass, aortic valve replacement, and right carotid stent. Defibrillator pads overlie the left chest. Stable cardiomegaly without superimposed edema or CHF. Low lung volumes persist with streaky bibasilar atelectasis. No significant interval change. No developing or enlarging effusion. Negative for pneumothorax. Trachea is midline. Aortic atherosclerosis evident.  IMPRESSION: Stable postoperative findings  Stable cardiomegaly with low lung volumes and bibasilar atelectasis.  Aortic atherosclerosis   Electronically Signed   By: Jerilynn Mages.  Shick M.D.   On: 02/04/2015 07:53   Dg Chest Port 1 View  02/04/2015   CLINICAL DATA:  Acute onset of hypoxia.  Initial encounter.  EXAM: PORTABLE CHEST - 1 VIEW  COMPARISON:  Chest radiograph performed 02/03/2015  FINDINGS: The lungs are hypoexpanded. Mild bibasilar opacities may reflect mild interstitial edema or pneumonia. No pleural effusion or pneumothorax is seen.  The cardiomediastinal silhouette is mildly enlarged. The patient is status post median sternotomy, with evidence of prior CABG. An aortic valve replacement is noted. No acute osseous abnormalities are identified. A vascular stent is noted along the right side of the neck.  IMPRESSION: Lungs hypoexpanded. Mild bibasilar opacities may reflect mild interstitial edema or pneumonia. Mild cardiomegaly.   Electronically Signed   By: Garald Balding M.D.   On: 02/04/2015 03:26   Dg Abd Portable 1v  02/04/2015   CLINICAL DATA:  Check nasogastric catheter placement  EXAM: PORTABLE ABDOMEN - 1 VIEW  COMPARISON:  None.  FINDINGS: Scattered large and small bowel gas is noted. A lamellated gallstone is noted in the right upper quadrant. A nasogastric catheter is seen within the mid stomach. No other focal  abnormality is noted.  IMPRESSION: Nasogastric catheter within the mid stomach.   Electronically Signed   By: Inez Catalina M.D.   On: 02/04/2015 14:58     Assessment and Plan  45M with CAD s/p 1V CABG/stenting, s/p AVR with bypass pieces, CKD, HTN, DM, PVD admitted with presyncope and HR in the 20s requiring transcutaneous pacing in the setting of hyperkalemia with K 5.8.   1. Sinus bradycardia - sinus node dysfunction, underlying conduction disease with junctional bradycardia - ? Related to amiodarone. The patient presented with symptomatic sinus bradycardia in the setting of underlying conduction system disease and pre-syncope. His amiodarone and metoprolol have been held and HR now in the 70's.will continue to hold beta blocker and amiodarone.  2. H/o post-op atrial fibrillation -Identified immediately post op CABG 2013. He has had no documented atrial fibrillation since Would not resume AAD therapy unless he has recurrent documented AF. If he has recurrent AF, will need to consider [place] with CHADS2VASC of at least 6.  3. CAD - Resume daily ASA when OK with IM. No recurrent chest pain today. Manage conservatively.  4. Aortic stenosis s/p AVR - echocardiogram this admission with normal valve  function.  5. Acute on chronic diastolic CHF with LE edema - weight down 15lb. Cr improved with diuresis. LEE still present and weight not yet back to baseline (146 lbs). Will switch to po lasix today 80 mg daily. If able to continue diuresis on oral lasix should be able to DC home tomorrow.   6. Leukocytosis/anemia - per IM.  7. HTN - IM has increased Imdur to 60mg  daily. If need be could increase hydralazine to 75mg  TID. Avoid ACEi/ARB with CKD.  8. AKI on CKD stage III - Cr continues to improve. Follow with above.  Signed,   Khris Jansson [place], [place] 02/10/2015 10:02 AM

## 2015-02-10 NOTE — Progress Notes (Signed)
UR Completed. Keyshun Elpers, RN, BSN.  [phone] 

## 2015-02-10 NOTE — Progress Notes (Signed)
Responded to page to assist patient with completing Advance Directives.  A.D. Was done and copies were given to patient and copy was given to nurse for file.  Patient alert and very pleasant. 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     02/10/15 1400  Clinical Encounter Type  Visited With Patient and family together;Health care provider  Visit Type Initial;Spiritual support  Referral From (assist with AD)  Spiritual Encounters  Spiritual Needs Emotional  Stress Factors  Patient Stress Factors None identified  Family Stress Factors None identified  Advance Directives (For Healthcare)  Does patient have an advance directive? Yes  Copy of advanced directive(s) in chart? Yes  .

## 2015-02-10 NOTE — Progress Notes (Signed)
Physical Therapy Treatment Patient Details Name: Collin Pearson MRN: [id number] DOB: 16-Jul-1927 Today's Date: 02/10/2015    History of Present Illness Admitted 6/21 with pre-syncope (also had fallen 2 days PTA), found to be bradycardic; developed hypotension and required intubation 6/22-6/23 with pressors. MD stopped several cardiac meds and does not plan on PPM. PMHx- CABG, AVR, clotting disorder, DM, HTN, PVD, CKD    PT Comments    Pt moving at MIN  To MIN/guard level. He was able to negotiate 2 stairs with 2 rails today. Wife present and confirmed that pt's daughter will be coming down and can provide 24/7 care for at least 2-3 weeks if not more.  Recommend HHPT and S with OOB which wife verbalized understanding.  Follow Up Recommendations  Home health PT;Supervision/Assistance - 24 hour;Supervision for mobility/OOB     Equipment Recommendations  None recommended by PT    Recommendations for Other Services       Precautions / Restrictions Precautions Precautions: Fall Restrictions Weight Bearing Restrictions: No    Mobility  Bed Mobility               General bed mobility comments: sitting in recliner upon arrival  Transfers Overall transfer level: Needs assistance Equipment used: Rolling walker (2 wheeled) Transfers: Sit to/from Stand Sit to Stand: Min guard         General transfer comment: cues to scoot to edge of chair and nose over toes.  Wife with good recall of technique  Ambulation/Gait Ambulation/Gait assistance: Min guard Ambulation Distance (Feet): 500 Feet Assistive device: Rolling walker (2 wheeled) Gait Pattern/deviations: Step-to pattern;Step-through pattern Gait velocity: decreased Gait velocity interpretation: Below normal speed for age/gender General Gait Details: Picking up RW initially, but with cueing improved cadence and was steadier as gait progressed.  Flexed posture.   Stairs Stairs: Yes Stairs assistance: Min assist Stair  Management: Two rails;Step to pattern;Backwards;Forwards Number of Stairs: 2 (x 2 reps) General stair comments: Used step to pattern with B rails forward and came down backwards. Wife present and knowledgeable in ways to assist him.  Wheelchair Mobility    Modified Rankin (Stroke Patients Only)       Balance             Standing balance-Leahy Scale: Poor Standing balance comment: slight posterior tendency that improved as gait progressed.                    Cognition Arousal/Alertness: Awake/alert Behavior During Therapy: WFL for tasks assessed/performed Overall Cognitive Status: Within Functional Limits for tasks assessed                      Exercises      General Comments General comments (skin integrity, edema, etc.): Deferred any further treatment after gait and stairs due to chaplain coming for a visit and pt wanting to visit with him.      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 and to get well enough to get out PT Goal Formulation: With patient Time For Goal Achievement: 02/20/15 Potential to Achieve Goals: Good Progress towards  PT goals: Progressing toward goals    Frequency  Min 3X/week    PT Plan Current plan remains appropriate    Co-evaluation             End of Session Equipment Utilized During Treatment: Gait belt Activity Tolerance: Patient tolerated treatment well Patient left: in chair;with call bell/phone within reach;with family/visitor present     Time: 0113-0137 PT Time Calculation (min) (ACUTE ONLY): 24 min  Charges:  $Gait Training: 23-37 mins                    G Codes:      Shanitha Twining LUBECK 02/10/2015, 1:53 PM

## 2015-02-10 NOTE — Progress Notes (Signed)
[facility] PROGRESS NOTE  Collin Pearson [id number] DOB: 1927-07-05 DOA: 02/03/2015 PCP: Helen Hashimoto., MD  Interim summary Patient is an 81 year old gentleman with a past medical history of hypertension, coronary disease, aortic stenosis, type 2 diabetes mellitus, who had been on amiodarone and metoprolol, he reported feeling presyncopal, EMS called, found to be bradycardic having a heart rate in the 20s on the field. A transcutaneous pacer was placed in route to [facility]. In the emergency department at outside facility he was started on isoproterenol and admitted to the intensive care unit under the care of pulmonary critical care medicine. Patient underwent rapid sequence intubation and started on IV dopamine and levo fed.Cardiology was consulted as patient was evaluated by Dr. Caryl Comes. It was felt that bradycardia could have resulted from amiodarone and metoprolol particularly with decreased clearance given history of renal failure. Patient was weaned off of dopamine and extubated on 02/05/2015. Heart rates improving.a transthoracic echocardiogram was performed on 02/04/2015 that showed an EF of 60-65%. Patient was transferred to 2 W. On 02/06/2015. He showed gradual clinical improvement with downward trend and creatinine. Heart rates remained stable mostly in the 60s. He was administered 60 mg of IV Lasix with his weight dropping from 74.2 kg on 02/07/2015 to 70.6 kg on 02/10/2015. On 02/02/2015 he had a net negative fluid balance of 3.5 L and was transitioned to oral Lasix 80 mg by mouth daily. Cardiology recommending monitoring for the next 24 hours, if remains stable anticipate discharge then   Assessment/Plan: 1. Bradycardia. -Patient presented with hemodynamic compromise in setting of sinus bradycardia, found to have heart rates in the 20s on the field. Transcutaneous pacer had been placed. He was initially started on isoproterenol, then placed on dopamine in the  intensive care unit. -It was felt that amiodarone and metoprolol may have led to sinus bradycardia, particularly with decreased clearance in setting of chronic kidney disease. -Patient's heart rates improved with telemetry showing ventricular rates in the 50s to 60s. He is hemodynamically stable, mentating well. -His heart rates remained stable on telemetry.  -Discussed with cardiology, agree that risks of beta blocker and amiodarone therapy outweigh benefits.   2. Fluid overload. -Patient having evidence of volume overload on physical examination with the presence of bilateral extremity pitting edema along with scrotal edema. -Patient's still has evidence of volume overload, has a net negative fluid balance of the 2.16 L, putting out 1650 mL over the past 24 hours. -Kidney function continues to improve -Reviewed ins and out, has a net negative fluid balance of 3.558 with weight coming down 4 kg since 02/07/2015 -He was transitioned to Lasix 80 mg by mouth daily on 02/02/2015  3.  Acute on chronic renal failure. -Likely secondary to decreased cardiac output and hypotension in setting of severe bradycardia. -patient has a history of stage IV chronic kidney disease, appears to have a baseline creatinine near 2.5-2.8 -Initially presented with creatinine of 3.41, improving to 2.89 on a.m. Lab work -Continues to show daily improvement of creatinine, coming down to 2.09 on 02/10/2015.  4.  History of atrial fibrillation with a CHADVasc score of 6 -Patient previously on amiodarone and metoprolol, both agents have been discontinued due to profound bradycardia. -Cardiology recommending considering anticoagulation if has recurrence of A. fib  5.  History of type 2 diabetes mellitus. -patient had been on glipizide 10 mg at home -Started Lantus 12 units subcutaneous daily at bedtime, cover with sliding scale insulin with meals and at bedtime -Blood sugars  for the most part remaining in the 100  range  6.  Hypertension. -Patient's blood pressures now elevated, having systolic blood pressures in the 160s to 170s -Metoprolol has been discontinued. Added imdur to his regimen given hypertension. On Hydralazine 50 mg PO TID -I increased Imdur to 60 mg by mouth daily on 02/09/2015, will monitor the pressures over the course of the day  Code Status: full code Family Communication: spoke with family members present at bedside Disposition Plan: Physical therapy consultation recommended home health services. Face-to-face completed   Consultants:  Cardiology  Pulmonary medicine    HPI/Subjective: Patient states feeling well today, tolerating by mouth intake, feels that he is having daily improvement of extremity and scrotal edema  Objective: Filed Vitals:   02/10/15 0955  BP: 166/59  Pulse: 72  Temp:   Resp: 18    Intake/Output Summary (Last 24 hours) at 02/10/15 1008 Last data filed at 02/10/15 0756  Gross per 24 hour  Intake    730 ml  Output   1200 ml  Net   -470 ml   Filed Weights   02/08/15 0640 02/09/15 0453 02/10/15 0442  Weight: 72.8 kg (160 lb 7.9 oz) 72.53 kg (159 lb 14.4 oz) 70.6 kg (155 lb 10.3 oz)    Exam:   General:  Patient is confused however awake and alert, mentating well, sitting at bedside chair.  Cardiovascular: regular rate and rhythm normal S1-S2 no murmurs rubs or gallops  Respiratory: normal respiratory effort, has a few bibasilar crackles  Abdomen: soft nontender nondistended  Musculoskeletal: Improvement in 1+ bilateral extremity pitting edema along with presence of scrotal and penile edema  Data Reviewed: Basic Metabolic Panel:  Recent Labs Lab 02/03/15 2244 02/04/15 0436 02/04/15 1229 02/05/15 0524 02/06/15 0430 02/07/15 0445 02/08/15 0430 02/09/15 0425 02/10/15 0425  NA 134* 133* 132* 136 138 138 138 138 137  K 4.0 4.0 4.4 3.9 4.5 4.0 3.6 3.3* 3.7  CL 99* 99* 98* 101 102 105 104 104 105  CO2 19* 20* 21* 24 23 26 26  26 25   GLUCOSE 283* 302* 191* 72 109* 183* 117* 65 136*  BUN 77* 73* 72* 64* 66* 58* 47* 35* 30*  CREATININE 3.63* 3.70* 3.82* 3.41* 3.39* 2.89* 2.43* 2.21* 2.09*  CALCIUM 8.2* 8.2* 8.4* 8.9 8.7* 8.3* 8.2* 8.2* 7.7*  MG 2.2 2.2 2.3 2.2  --   --   --   --   --   PHOS 3.9 4.1 4.0 2.9  --   --   --   --   --    Liver Function Tests:  Recent Labs Lab 02/03/15 2244 02/06/15 0430  AST 103* 54*  ALT 104* 92*  ALKPHOS 72 67  BILITOT 0.7 0.8  PROT 5.2* 5.5*  ALBUMIN 2.7* 2.5*   No results for input(s): LIPASE, AMYLASE in the last 168 hours. No results for input(s): AMMONIA in the last 168 hours. CBC:  Recent Labs Lab 02/05/15 0524 02/06/15 0430 02/08/15 0430 02/09/15 0425 02/10/15 0425  WBC 14.1* 12.5* 11.7* 15.8* 13.9*  HGB 10.1* 9.0* 9.1* 8.9* 8.5*  HCT 30.7* 28.1* 28.3* 27.5* 26.7*  MCV 94.5 97.6 96.9 96.5 98.2  PLT 192 177 177 175 179   Cardiac Enzymes:  Recent Labs Lab 02/03/15 2244 02/04/15 0436 02/04/15 1229 02/09/15 1014  TROPONINI 0.04* 0.07* 0.13* 0.08*   BNP (last 3 results) No results for input(s): BNP in the last 8760 hours.  ProBNP (last 3 results) No results for input(s): PROBNP  in the last 8760 hours.  CBG:  Recent Labs Lab 02/09/15 0701 02/09/15 1134 02/09/15 1605 02/09/15 2108 02/10/15 0620  GLUCAP 107* 122* 146* 157* 132*    Recent Results (from the past 240 hour(s))  MRSA PCR Screening     Status: None   Collection Time: 02/03/15  7:07 PM  Result Value Ref Range Status   MRSA by PCR NEGATIVE NEGATIVE Final    Comment:        The GeneXpert MRSA Assay (FDA approved for NASAL specimens only), is one component of a comprehensive MRSA colonization surveillance program. It is not intended to diagnose MRSA infection nor to guide or monitor treatment for MRSA infections.   Culture, blood (routine x 2)     Status: None   Collection Time: 02/03/15 10:35 PM  Result Value Ref Range Status   Specimen Description BLOOD LEFT ARM  Final    Special Requests BOTTLES DRAWN AEROBIC ONLY 4CC  Final   Culture NO GROWTH 5 DAYS  Final   Report Status 02/09/2015 FINAL  Final  Culture, blood (routine x 2)     Status: None   Collection Time: 02/03/15 10:44 PM  Result Value Ref Range Status   Specimen Description BLOOD LEFT HAND  Final   Special Requests BOTTLES DRAWN AEROBIC ONLY 3CC  Final   Culture NO GROWTH 5 DAYS  Final   Report Status 02/09/2015 FINAL  Final     Studies: No results found.  Scheduled Meds: . furosemide  80 mg Oral Daily  . heparin  5,000 Units Subcutaneous 3 times per day  . hydrALAZINE  50 mg Oral TID  . insulin aspart  0-15 Units Subcutaneous TID WC  . insulin aspart  0-5 Units Subcutaneous QHS  . isosorbide mononitrate  60 mg Oral Daily  . pantoprazole  40 mg Oral QHS  . sodium chloride  10-40 mL Intracatheter Q12H   Continuous Infusions:    Principal Problem:   Bradycardia Active Problems:   Encounter for nasogastric tube placement   Endotracheally intubated   Hypoxia   Pulmonary edema   Acute on chronic renal failure   Atrial fibrillation   Physical deconditioning   Acute on chronic diastolic CHF (congestive heart failure), NYHA class 1    Time spent: 25 min    Kelvin Cellar  [facility] Pager [phone]. If 7PM-7AM, please contact night-coverage at [email or web address], password [facility] 02/10/2015, 10:08 AM  LOS: 7 days

## 2015-02-11 DIAGNOSIS — I10 Essential (primary) hypertension: Secondary | ICD-10-CM

## 2015-02-11 LAB — BASIC METABOLIC PANEL
Anion gap: 10 (ref 5–15)
BUN: 25 mg/dL — ABNORMAL HIGH (ref 6–20)
CO2: 24 mmol/L (ref 22–32)
CREATININE: 1.93 mg/dL — AB (ref 0.61–1.24)
Calcium: 7.9 mg/dL — ABNORMAL LOW (ref 8.9–10.3)
Chloride: 104 mmol/L (ref 101–111)
GFR, EST AFRICAN AMERICAN: 35 mL/min — AB (ref >60)
GFR, EST NON AFRICAN AMERICAN: 30 mL/min — AB (ref >60)
Glucose, Bld: 161 mg/dL — ABNORMAL HIGH (ref 65–99)
POTASSIUM: 3.7 mmol/L (ref 3.5–5.1)
Sodium: 138 mmol/L (ref 135–145)

## 2015-02-11 LAB — GLUCOSE, CAPILLARY
GLUCOSE-CAPILLARY: 169 mg/dL — AB (ref 65–99)
Glucose-Capillary: 146 mg/dL — ABNORMAL HIGH (ref 65–99)
Glucose-Capillary: 183 mg/dL — ABNORMAL HIGH (ref 65–99)

## 2015-02-11 MED ORDER — HYDRALAZINE HCL 25 MG PO TABS: 75.0000 mg | ORAL_TABLET | Freq: Three times a day (TID) | ORAL | Status: DC

## 2015-02-11 MED ORDER — HYDRALAZINE HCL 50 MG PO TABS
75.0000 mg | ORAL_TABLET | Freq: Three times a day (TID) | ORAL | Status: DC
  Administered 2015-02-11: 75 mg via ORAL
  Filled 2015-02-11 (×2): qty 1

## 2015-02-11 MED ORDER — FUROSEMIDE 80 MG PO TABS: 80.0000 mg | ORAL_TABLET | Freq: Every day | ORAL | Status: DC

## 2015-02-11 MED ORDER — ISOSORBIDE MONONITRATE ER 60 MG PO TB24: 60.0000 mg | ORAL_TABLET | Freq: Every day | ORAL | Status: DC

## 2015-02-11 NOTE — Discharge Summary (Signed)
Physician Discharge Summary  Collin Pearson [id number] DOB: [birth year] DOA: 02/02/78 2016  PCP: Helen Hashimoto., MD  Admit date: 02/03/2015 Discharge date: 02/11/2015  Time spent: >35 minutes  Recommendations for Outpatient Follow-up:  F/u with PCP in 3-5 days F/u with cardiology. HF clinic  next week  Discharge Diagnoses:  Principal Problem:   Bradycardia Active Problems:   Encounter for nasogastric tube placement   Endotracheally intubated   Hypoxia   Pulmonary edema   Acute on chronic renal failure   Atrial fibrillation   Physical deconditioning   Acute on chronic diastolic CHF (congestive heart failure), NYHA class 1   Discharge Condition: stable   Diet recommendation: DM  Filed Weights   02/09/15 0453 02/10/15 0442 02/11/15 0525  Weight: 72.53 kg (159 lb 14.4 oz) 70.6 kg (155 lb 10.3 oz) 69.7 kg (153 lb 10.6 oz)    History of present illness:  Patient is an 81 year old gentleman with a past medical history of hypertension, coronary disease, aortic stenosis, type 2 diabetes mellitus, who had been on amiodarone and metoprolol, he reported feeling presyncopal, EMS called, found to be bradycardic having a heart rate in the 20s on the field. A transcutaneous pacer was placed in route to [facility]. In the emergency department at outside facility he was started on isoproterenol and admitted to the intensive care unit under the care of pulmonary critical care medicine. Patient underwent rapid sequence intubation and started on IV dopamine and levo fed.Cardiology was consulted as patient was evaluated by Dr. Caryl Comes. It was felt that bradycardia could have resulted from amiodarone and metoprolol particularly with decreased clearance given history of renal failure. Patient was weaned off of dopamine and extubated on 02/05/2015. Heart rates improving.a transthoracic echocardiogram was performed on 02/04/2015 that showed an EF of 60-65%. Patient was transferred to 2 W. On  02/06/2015. He showed gradual clinical improvement with downward trend and creatinine. Heart rates remained stable mostly in the 60s. He was administered 60 mg of IV Lasix with his weight dropping from 74.2 kg on 02/07/2015 to 70.6 kg on 02/10/2015. On 02/02/2015 he had a net negative fluid balance of 3.5 L and was transitioned to oral Lasix 80 mg by mouth daily.   Hospital Course:  1. Bradycardia. -Patient presented with hemodynamic compromise in setting of sinus bradycardia, found to have heart rates in the 20s on the field. Transcutaneous pacer had been placed. He was initially started on isoproterenol, then placed on dopamine in the intensive care unit. -It was felt that amiodarone and metoprolol may have led to sinus bradycardia, particularly with decreased clearance in setting of chronic kidney disease. -Patient's heart rates improved with telemetry showing ventricular rates in the 50s to 60s. He is hemodynamically stable, mentating well. -His heart rates remained stable on telemetry.  -Discussed with cardiology, agree that risks of beta blocker and amiodarone therapy outweigh benefits.   2. Fluid overload. -Patient having evidence of volume overload on physical examination with the presence of bilateral extremity pitting edema along with scrotal edema. -Patient's still has evidence of volume overload, has a net negative fluid balance of the 2.16 L, -Reviewed ins and out, has a net negative fluid balance of 3.558 with weight coming down 4 kg since 02/07/2015 -He was transitioned to Lasix 80 mg by mouth daily on 02/02/2015 3. Acute on chronic renal failure. -Likely secondary to decreased cardiac output and hypotension in setting of severe bradycardia. -patient has a history of stage IV chronic kidney disease, appears to have  a baseline creatinine near 2.5-2.8 -Initially presented with creatinine of 3.41, improving to 2.89 on a.m. Lab work -Continues to show daily improvement of creatinine,  coming down to 2.09 on 02/10/2015. 4. History of atrial fibrillation with a CHADVasc score of 6 -Patient previously on amiodarone and metoprolol, both agents have been discontinued due to profound bradycardia. -Cardiology recommending considering anticoagulation if has recurrence of A. fib 5. History of type 2 diabetes mellitus. Glucose is controled  -Cont levemir subcutaneous daily at bedtime. Hold glipizide 10 mg due to renal dysfunction  6. Hypertension. -Patient's blood pressures now elevated, having systolic blood pressures in the 160s to 170s -Metoprolol has been discontinued. Added imdur to his regimen given hypertension. Increased Hydralazine 75 mg PO TID    Procedures:  none (i.e. Studies not automatically included, echos, thoracentesis, etc; not x-rays)  Consultations:  Cardiology   Discharge Exam: Filed Vitals:   02/11/15 1349  BP: 155/53  Pulse: 70  Temp: 99 F (37.2 C)  Resp: 17    General: alert, oriented  Cardiovascular: s1,s2 rrr Respiratory: CTA BL  Discharge Instructions  Discharge Instructions    Diet - low sodium heart healthy    Complete by:  As directed      Discharge instructions    Complete by:  As directed   Please follow up with primary care doctor in 3-5 days Please follow up with cardiology next week     Increase activity slowly    Complete by:  As directed             Medication List    STOP taking these medications        amiodarone 200 MG tablet  Commonly known as:  PACERONE     glipiZIDE 10 MG tablet  Commonly known as:  GLUCOTROL     metoprolol tartrate 25 MG tablet  Commonly known as:  LOPRESSOR     sodium bicarbonate 650 MG tablet     solifenacin 5 MG tablet  Commonly known as:  VESICARE      TAKE these medications        amLODipine 10 MG tablet  Commonly known as:  NORVASC  Take 1 tablet (10 mg total) by mouth daily.     aspirin EC 81 MG tablet  Take 81 mg by mouth 2 (two) times daily.     atorvastatin  40 MG tablet  Commonly known as:  LIPITOR  TAKE 1 TABLET DAILY AT BEDTIME     BD INSULIN SYRINGE ULTRAFINE 31G X 5/16" 1 ML Misc  Generic drug:  Insulin Syringe-Needle U-100     clotrimazole-betamethasone cream  Commonly known as:  LOTRISONE  Apply 1 application topically daily as needed (dry skin, itching).     fish oil-omega-3 fatty acids 1000 MG capsule  Take 1 g by mouth 3 (three) times daily.     furosemide 80 MG tablet  Commonly known as:  LASIX  Take 1 tablet (80 mg total) by mouth daily.     hydrALAZINE 25 MG tablet  Commonly known as:  APRESOLINE  Take 3 tablets (75 mg total) by mouth 3 (three) times daily.     Iron Tabs  Take 160 mg by mouth 2 (two) times daily.     isosorbide mononitrate 60 MG 24 hr tablet  Commonly known as:  IMDUR  Take 1 tablet (60 mg total) by mouth daily.     LEVEMIR 100 UNIT/ML injection  Generic drug:  insulin detemir  Inject 20 Units  into the skin at bedtime.     multivitamin with minerals Tabs tablet  Take 1 tablet by mouth daily.     ONE TOUCH ULTRA TEST test strip  Generic drug:  glucose blood     pantoprazole 40 MG tablet  Commonly known as:  PROTONIX  Take 40 mg by mouth daily.     PROCRIT IJ  Inject 1 Dose as directed as needed.     silver sulfADIAZINE 1 % cream  Commonly known as:  SILVADENE  Apply 1 application topically as needed (dry skin).     STOOL SOFTENER 100 MG capsule  Generic drug:  docusate sodium  Take 100 mg by mouth 3 (three) times a week.     tamsulosin 0.4 MG Caps capsule  Commonly known as:  FLOMAX  Take 0.4 mg by mouth. One tablet in the morning and 1/2 tablet in the evening     vitamin E 400 UNIT capsule  Generic drug:  vitamin E  Take 400 Units by mouth daily.       No Known Allergies     Follow-up Information    Follow up with [facility].   Why:  Registerd Nurse and Physical therapist   Contact information:   [address] [place]  [address] [phone]       Follow up with CAMPBELL, STEPHEN D., MD. Schedule an appointment as soon as possible for a visit in 3 days.   Specialty:  Internal Medicine   Contact information:   [place] [address] [phone]        The results of significant diagnostics from this hospitalization (including imaging, microbiology, ancillary and laboratory) are listed below for reference.    Significant Diagnostic Studies: Dg Chest Port 1 View  02/05/2015   CLINICAL DATA:  Intubation.  EXAM: PORTABLE CHEST - 1 VIEW  COMPARISON:  02/04/2015.  FINDINGS: Endotracheal tube, left IJ line, NG tube in stable position. Right carotid stent again noted in stable position. Mediastinum and hilar structures are normal. Prior cardiac valve replacement. Stable cardiomegaly. Persistent low lung volumes with bibasilar atelectasis and/or infiltrates. Small pleural effusions. No pneumothorax.  IMPRESSION: 1. Lines and tubes in stable position. Right carotid stent in stable position. 2. Persistent bibasilar atelectasis and/or infiltrates. Small pleural effusions. 3. Prior cardiac valve replacement.  Stable cardiomegaly.   Electronically Signed   By: Marcello Moores  Register   On: 02/05/2015 07:32   Dg Chest Port 1 View  02/04/2015   CLINICAL DATA:  Hypoxia  EXAM: PORTABLE CHEST - 1 VIEW  COMPARISON:  Study obtained earlier in the day  FINDINGS: Endotracheal tube tip is 1.8 cm above the carina. Central catheter tip is at the junction of the left innominate vein and superior vena cava. No pneumothorax. There is consolidation in the left lower lobe. There is mild atelectasis in the right mid lung. Lungs elsewhere are clear. Heart is upper normal in size with pulmonary vascular within normal limits. The patient has an aortic valve replacement. The patient is also status post coronary artery bypass grafting. There is a right carotid region stent.  IMPRESSION: Tube and catheter positions as described  without pneumothorax. Increased opacity consistent consolidation left lower lobe. Stable atelectasis right midlung. No change in cardiac silhouette.   Electronically Signed   By: Lowella Grip III M.D.   On: 02/04/2015 14:11   Dg Chest Port 1 View  02/04/2015   CLINICAL DATA:  Heart failure, pulmonary  edema  EXAM: PORTABLE CHEST - 1 VIEW  COMPARISON:  02/04/2015  FINDINGS: Stable postop changes of coronary bypass, aortic valve replacement, and right carotid stent. Defibrillator pads overlie the left chest. Stable cardiomegaly without superimposed edema or CHF. Low lung volumes persist with streaky bibasilar atelectasis. No significant interval change. No developing or enlarging effusion. Negative for pneumothorax. Trachea is midline. Aortic atherosclerosis evident.  IMPRESSION: Stable postoperative findings  Stable cardiomegaly with low lung volumes and bibasilar atelectasis.  Aortic atherosclerosis   Electronically Signed   By: Jerilynn Mages.  Shick M.D.   On: 02/04/2015 07:53   Dg Chest Port 1 View  02/04/2015   CLINICAL DATA:  Acute onset of hypoxia.  Initial encounter.  EXAM: PORTABLE CHEST - 1 VIEW  COMPARISON:  Chest radiograph performed 02/03/2015  FINDINGS: The lungs are hypoexpanded. Mild bibasilar opacities may reflect mild interstitial edema or pneumonia. No pleural effusion or pneumothorax is seen.  The cardiomediastinal silhouette is mildly enlarged. The patient is status post median sternotomy, with evidence of prior CABG. An aortic valve replacement is noted. No acute osseous abnormalities are identified. A vascular stent is noted along the right side of the neck.  IMPRESSION: Lungs hypoexpanded. Mild bibasilar opacities may reflect mild interstitial edema or pneumonia. Mild cardiomegaly.   Electronically Signed   By: Garald Balding M.D.   On: 02/04/2015 03:26   Dg Abd Portable 1v  02/04/2015   CLINICAL DATA:  Check nasogastric catheter placement  EXAM: PORTABLE ABDOMEN - 1 VIEW  COMPARISON:  None.   FINDINGS: Scattered large and small bowel gas is noted. A lamellated gallstone is noted in the right upper quadrant. A nasogastric catheter is seen within the mid stomach. No other focal abnormality is noted.  IMPRESSION: Nasogastric catheter within the mid stomach.   Electronically Signed   By: Inez Catalina M.D.   On: 02/04/2015 14:58    Microbiology: Recent Results (from the past 240 hour(s))  MRSA PCR Screening     Status: None   Collection Time: 02/03/15  7:07 PM  Result Value Ref Range Status   MRSA by PCR NEGATIVE NEGATIVE Final    Comment:        The GeneXpert MRSA Assay (FDA approved for NASAL specimens only), is one component of a comprehensive MRSA colonization surveillance program. It is not intended to diagnose MRSA infection nor to guide or monitor treatment for MRSA infections.   Culture, blood (routine x 2)     Status: None   Collection Time: 02/03/15 10:35 PM  Result Value Ref Range Status   Specimen Description BLOOD LEFT ARM  Final   Special Requests BOTTLES DRAWN AEROBIC ONLY 4CC  Final   Culture NO GROWTH 5 DAYS  Final   Report Status 02/09/2015 FINAL  Final  Culture, blood (routine x 2)     Status: None   Collection Time: 02/03/15 10:44 PM  Result Value Ref Range Status   Specimen Description BLOOD LEFT HAND  Final   Special Requests BOTTLES DRAWN AEROBIC ONLY 3CC  Final   Culture NO GROWTH 5 DAYS  Final   Report Status 02/09/2015 FINAL  Final     Labs: Basic Metabolic Panel:  Recent Labs Lab 02/05/15 0524  02/07/15 0445 02/08/15 0430 02/09/15 0425 02/10/15 0425 02/11/15 0505  NA 136  < > 138 138 138 137 138  K 3.9  < > 4.0 3.6 3.3* 3.7 3.7  CL 101  < > 105 104 104 105 104  CO2 24  < >  26 26 26 25 24   GLUCOSE 72  < > 183* 117* 65 136* 161*  BUN 64*  < > 58* 47* 35* 30* 25*  CREATININE 3.41*  < > 2.89* 2.43* 2.21* 2.09* 1.93*  CALCIUM 8.9  < > 8.3* 8.2* 8.2* 7.7* 7.9*  MG 2.2  --   --   --   --   --   --   PHOS 2.9  --   --   --   --   --    --   < > = values in this interval not displayed. Liver Function Tests:  Recent Labs Lab 02/06/15 0430  AST 54*  ALT 92*  ALKPHOS 67  BILITOT 0.8  PROT 5.5*  ALBUMIN 2.5*   No results for input(s): LIPASE, AMYLASE in the last 168 hours. No results for input(s): AMMONIA in the last 168 hours. CBC:  Recent Labs Lab 02/05/15 0524 02/06/15 0430 02/08/15 0430 02/09/15 0425 02/10/15 0425  WBC 14.1* 12.5* 11.7* 15.8* 13.9*  HGB 10.1* 9.0* 9.1* 8.9* 8.5*  HCT 30.7* 28.1* 28.3* 27.5* 26.7*  MCV 94.5 97.6 96.9 96.5 98.2  PLT 192 177 177 175 179   Cardiac Enzymes:  Recent Labs Lab 02/09/15 1014  TROPONINI 0.08*   BNP: BNP (last 3 results) No results for input(s): BNP in the last 8760 hours.  ProBNP (last 3 results) No results for input(s): PROBNP in the last 8760 hours.  CBG:  Recent Labs Lab 02/10/15 1128 02/10/15 1649 02/10/15 2116 02/11/15 0624 02/11/15 1122  GLUCAP 214* 185* 172* 146* 183*       Signed:  Isadore Palecek N  [facility] 02/11/2015, 2:10 PM

## 2015-02-11 NOTE — Progress Notes (Signed)
Pt being discharged home via wheelchair with family. Pt alert and oriented; on RA. VSS. Pt c/o no pain at this time. No signs of respiratory distress. Education complete and care plans resolved. IV team removed central line catheter and pt tolerated well. No signs of bleeding at this time. No other issues at this time. Pt to follow up with PCP. Leanne Chang, RN

## 2015-02-11 NOTE — Progress Notes (Signed)
Subjective: No SOB or pain  Objective: Vital signs in last 24 hours: Temp:  [97.9 F (36.6 C)-98.6 F (37 C)] 98.6 F (37 C) (06/29 0525) Pulse Rate:  [71-75] 71 (06/29 0525) Resp:  [17-18] 18 (06/29 0525) BP: (159-178)/(52-65) 178/59 mmHg (06/29 0956) SpO2:  [95 %-97 %] 95 % (06/29 0525) Weight:  [153 lb 10.6 oz (69.7 kg)] 153 lb 10.6 oz (69.7 kg) (06/29 0525) Last BM Date: 02/11/15  Intake/Output from previous day: 06/28 0701 - 06/29 0700 In: 750 [P.O.:720; I.V.:30] Out: 1175 [Urine:1175] Intake/Output this shift: Total I/O In: 240 [P.O.:240] Out: 100 [Urine:100]  Medications Scheduled Meds: . furosemide  80 mg Oral Daily  . heparin  5,000 Units Subcutaneous 3 times per day  . hydrALAZINE  50 mg Oral TID  . insulin aspart  0-15 Units Subcutaneous TID WC  . insulin aspart  0-5 Units Subcutaneous QHS  . isosorbide mononitrate  60 mg Oral Daily  . pantoprazole  40 mg Oral QHS  . sodium chloride  10-40 mL Intracatheter Q12H   Continuous Infusions:  PRN Meds:.acetaminophen, ondansetron (ZOFRAN) IV, sodium chloride  PE: General appearance: alert, cooperative, no distress and Sittin gin chair eating lunch Lungs: Decreased BS on the right. Heart: regular rate and rhythm and 2/6 SYS MM loudest at LSB Abdomen: +BS, nontender,  Somewhat tense.  Extremities: 1+ LEE Pulses: 2+ and symmetric Skin: Warm and dry. Neurologic: Grossly normal  Lab Results:   Recent Labs  02/09/15 0425 02/10/15 0425  WBC 15.8* 13.9*  HGB 8.9* 8.5*  HCT 27.5* 26.7*  PLT 175 179   BMET  Recent Labs  02/09/15 0425 02/10/15 0425 02/11/15 0505  NA 138 137 138  K 3.3* 3.7 3.7  CL 104 105 104  CO2 26 25 24   GLUCOSE 65 136* 161*  BUN 35* 30* 25*  CREATININE 2.21* 2.09* 1.93*  CALCIUM 8.2* 7.7* 7.9*    Assessment/Plan   78M with CAD s/p 1V CABG/stenting, s/p AVR with bypass pieces, CKD, HTN, DM, PVD admitted with presyncope and HR in the 20s requiring transcutaneous pacing in  the setting of hyperkalemia with K 5.8.   1. Sinus bradycardia - sinus node dysfunction, underlying conduction disease with junctional bradycardia - ? Related to amiodarone. The patient presented with symptomatic sinus bradycardia in the setting of underlying conduction system disease and pre-syncope. His amiodarone and metoprolol have been held and HR now in the 70's.will continue to hold beta blocker and amiodarone.  2. H/o post-op atrial fibrillation -Identified immediately post op CABG 2013. He has had no documented atrial fibrillation since.  Would not resume AAD therapy unless he has recurrent documented AF. If he has recurrent AF, will need to consider [place] with CHADS2VASC of at least 6.  3. CAD - Resume daily ASA when OK with IM. No recurrent chest pain today. Manage conservatively.  4. Aortic stenosis s/p AVR - echocardiogram this admission with normal valve function.  5. Acute on chronic diastolic CHF with LE edema -  Net fluids: -0.4L/-4.2L.  weight down 17lb. 153#.  Cr continues to improve with diuresis. LEE still present and weight not yet back to baseline (146 lbs). On po lasix today 80 mg daily and continues to diurese.   6. Leukocytosis/anemia - per IM.  7. HTN - IM has increased Imdur to 60mg  daily. Increase hydralazine to 75mg  TID. Avoid ACEi/ARB with CKD.   8. AKI on CKD stage III - Cr continues to improve. Follow with above.  Will  arrange TOC follow up.     LOS: 8 days    HAGER, BRYAN PA-C 02/11/2015 12:32 PM  Patient seen and examined and history reviewed. Agree with above findings and plan. Patient is feeling very well. No dyspnea. His weight is down an additional 2 lbs since lasix switched to po yesterday. Still not at dry weight of 148 but has lost 17 lbs this admission. I think this will continue to improve. A lot of this may have been related to his bradyarrhythmia on amiodarone. BP is still high. Will increase hydralazine. I think he is stable for DC  today with close transition of care follow up in our office.   Alesha Jaffee [place], [place] 02/11/2015 12:50 PM

## 2015-02-18 ENCOUNTER — Encounter: Payer: Medicare Other | Admitting: Nurse Practitioner

## 2015-03-05 ENCOUNTER — Ambulatory Visit (INDEPENDENT_AMBULATORY_CARE_PROVIDER_SITE_OTHER): Payer: PRIVATE HEALTH INSURANCE | Admitting: Cardiovascular Disease

## 2015-03-05 ENCOUNTER — Encounter: Payer: Self-pay | Admitting: Cardiovascular Disease

## 2015-03-05 VITALS — BP 168/68 | HR 73 | Ht 65.5 in | Wt 138.6 lb

## 2015-03-05 DIAGNOSIS — Z951 Presence of aortocoronary bypass graft: Secondary | ICD-10-CM

## 2015-03-05 DIAGNOSIS — E785 Hyperlipidemia, unspecified: Secondary | ICD-10-CM

## 2015-03-05 DIAGNOSIS — I739 Peripheral vascular disease, unspecified: Secondary | ICD-10-CM | POA: Diagnosis not present

## 2015-03-05 DIAGNOSIS — I10 Essential (primary) hypertension: Secondary | ICD-10-CM | POA: Diagnosis not present

## 2015-03-05 DIAGNOSIS — I6529 Occlusion and stenosis of unspecified carotid artery: Secondary | ICD-10-CM

## 2015-03-05 DIAGNOSIS — R001 Bradycardia, unspecified: Secondary | ICD-10-CM

## 2015-03-05 NOTE — Patient Instructions (Signed)
We request that you follow-up in: 3 months with an extender and in 6 months with Dr Andria Rhein will receive a reminder letter in the mail two months in advance. If you don't receive a letter, please call our office to schedule the follow-up appointment.

## 2015-03-05 NOTE — Assessment & Plan Note (Signed)
Mr. Collin Pearson was recently admitted with profound bradycardia and hypotension thought related to his amiodarone and beta blocker. He was placed on pressors and positive chronotropic trips. His amiodarone and beta blockers were discontinued. Ultimately his heart rate came back up to normal. He was diuresed. He currently is on no rate lowering medications.

## 2015-03-05 NOTE — Progress Notes (Signed)
03/05/2015 Collin Pearson   12-07-1926  [id number]  Primary Physician Helen Hashimoto., MD Primary Cardiologist: Lorretta Harp MD [name]   HPI:  The patient is a delightful 81 year old thin appearing married Caucasian male who I last saw approximately 24months ago. He has a history of CAD status post stenting of his LAD, RCA, and OM branches in [place], [place]. He has had bilateral carotid endarterectomies at [facility] in [place] and bilateral carotid stenting in [place], [place]. His other problems include hypertension, hyperlipidemia, diabetes and chronic renal insufficiency. He had critical aortic stenosis with chest pain and dyspnea. Because of worsening carotid Dopplers I angiogrammed him May 18, 2012 revealing moderate in-stent restenosis within the right carotid stent. He ultimately underwent aortic valve replacement with an [place] TFX bioprosthesis as well as SVG to RCA by Dr. Ceasar Mons June 14, 2012. His followup 2D echo was normal. Tomorrow is his last day of cardiac rehab and he has recuperated nicely. His creatinines have remained stable. His other problems include hypertension, hyperlipidemia, and non-insulin-requiring diabetes. His most recent carotid Doppler performed in February revealed stable velocities on the right correlating to 50-60% in-stent restenosis. Since I saw him 12 months ago he he remains completely stable. He denies chest pain or shortness of breath.he had a recent 2-D echo showed normal systolic function with a well functioning aortic bioprosthesis. Recent carotid Doppler showed progression of disease in his right carotid stent probably in the 80-90% range. He is on aspirin Plavix and remains neurologically asymptomatic. His major complaint is of pain in his right second toe with a nonhealing ulcer probably related to PAD. His serum creatinines are elevated making intervention somewhat  problematic.he has been seeing the wound care center has been getting hyperbaric therapy. His wound is slowly healing. He also has had some bursitis and cellulitis in the right leg and is getting physical therapy.Collin Pearson was admitted to [facility] 02/03/15 for approximate one week because of profound bradycardia and hypotension. This was thought to be secondary to amiodarone and beta blocker therapy both which were just discontinued. He did have worsening of his chronic renal insufficiency which improved over time back to her baseline of approximately 2. He was diuresed.  Current Outpatient Prescriptions  Medication Sig Dispense Refill  . amLODipine (NORVASC) 10 MG tablet Take 1 tablet (10 mg total) by mouth daily. 90 tablet 2  . aspirin EC 81 MG tablet Take 81 mg by mouth 2 (two) times daily.    Marland Kitchen atorvastatin (LIPITOR) 40 MG tablet TAKE 1 TABLET DAILY AT BEDTIME (Patient taking differently: No sig reported) 90 tablet 2  . BD INSULIN SYRINGE ULTRAFINE 31G X 5/16" 1 ML MISC     . clotrimazole-betamethasone (LOTRISONE) cream Apply 1 application topically daily as needed (dry skin, itching).     Marland Kitchen docusate sodium (STOOL SOFTENER) 100 MG capsule Take 100 mg by mouth 3 (three) times a week.    Marland Kitchen Epoetin Alfa (PROCRIT IJ) Inject 1 Dose as directed as needed.     . fish oil-omega-3 fatty acids 1000 MG capsule Take 1 g by mouth 3 (three) times daily.    . furosemide (LASIX) 80 MG tablet Take 1 tablet (80 mg total) by mouth daily.    . hydrALAZINE (APRESOLINE) 25 MG tablet Take 3 tablets (75 mg total) by mouth 3 (three) times daily. 60 tablet 3  . Iron TABS Take 160 mg by mouth 2 (two) times daily.     Marland Kitchen  isosorbide mononitrate (IMDUR) 60 MG 24 hr tablet Take 1 tablet (60 mg total) by mouth daily. 30 tablet 1  . LEVEMIR 100 UNIT/ML injection Inject 20 Units into the skin at bedtime.    . Multiple Vitamin (MULTIVITAMIN WITH MINERALS) TABS Take 1 tablet by mouth daily.    . ONE TOUCH ULTRA TEST  test strip     . pantoprazole (PROTONIX) 40 MG tablet Take 40 mg by mouth daily.    . silver sulfADIAZINE (SILVADENE) 1 % cream Apply 1 application topically as needed (dry skin).     . Tamsulosin HCl (FLOMAX) 0.4 MG CAPS Take 0.4 mg by mouth. One tablet in the morning and 1/2 tablet in the evening    . vitamin E (VITAMIN E) 400 UNIT capsule Take 400 Units by mouth daily.     No current facility-administered medications for this visit.    No Known Allergies  History   Social History  . Marital Status: Married    Spouse Name: N/A  . Number of Children: N/A  . Years of Education: N/A   Occupational History  . Not on file.   Social History Main Topics  . Smoking status: Never Smoker   . Smokeless tobacco: Never Used  . Alcohol Use: No  . Drug Use: No  . Sexual Activity: Not Currently   Other Topics Concern  . Not on file   Social History Narrative     Review of Systems: General: negative for chills, fever, night sweats or weight changes.  Cardiovascular: negative for chest pain, dyspnea on exertion, edema, orthopnea, palpitations, paroxysmal nocturnal dyspnea or shortness of breath Dermatological: negative for rash Respiratory: negative for cough or wheezing Urologic: negative for hematuria Abdominal: negative for nausea, vomiting, diarrhea, bright red blood per rectum, melena, or hematemesis Neurologic: negative for visual changes, syncope, or dizziness All other systems reviewed and are otherwise negative except as noted above.    Blood pressure 168/68, pulse 73, height 5' 5.5" (1.664 m), weight 138 lb 9.6 oz (62.869 kg).  General appearance: alert and no distress Neck: no adenopathy, no JVD, supple, symmetrical, trachea midline, thyroid not enlarged, symmetric, no tenderness/mass/nodules and bilateral carotid bruits Lungs: clear to auscultation bilaterally Heart: regular rate and rhythm Extremities: extremities normal, atraumatic, no cyanosis or edema  EKG ot  performed today  ASSESSMENT AND PLAN:   S/P CABG x 1 with tissue AVR 06/13/12 History of CAD status post LAD, RCA and obtuse marginal branch stenting in [place]. He underwent aortic valve replacement with an [employer] life science model 3300 TFX bioprosthesis as well as a vein graft to the RCA by Dr. Servando Snare 06/14/12.  PVD, with hx of bilateral CEA at [facility], and previous bilateral carotid stenting, with high grade ISR of Rt. carotid 05/18/12 History of carotid artery disease status post bilateral carotid endarterectomies at [facility] in [place] as well as bilateral carotid stenting in [facility]. We will follow his carotids by duplex ultrasound. These were most recently performed 10/20/14 revealing high-grade "in-stent restenosis" within the right carotid stent and moderate left internal carotid artery stenosis.  HTN (hypertension) History of hypertension blood pressure measured today at 168/68. He is on amlodipine, hydralazine. Blood pressures at home are somewhat better than this. He has moderate renal deficiency and therefore cannot be on an ACE inhibitor or ARB medication. In addition, he cannot be on a beta blocker because of recent episode of bradycardia.  Dyslipidemia History of hyperlipidemia on atorvastatin followed by his PCP  Bradycardia  Collin Pearson was recently admitted with profound bradycardia and hypotension thought related to his amiodarone and beta blocker. He was placed on pressors and positive chronotropic trips. His amiodarone and beta blockers were discontinued. Ultimately his heart rate came back up to normal. He was diuresed. He currently is on no rate lowering medications.      Lorretta Harp MD FACP,FACC,FAHA, [facility] 03/05/2015 1:01 PM

## 2015-03-05 NOTE — Assessment & Plan Note (Signed)
History of hypertension blood pressure measured today at 168/68. He is on amlodipine, hydralazine. Blood pressures at home are somewhat better than this. He has moderate renal deficiency and therefore cannot be on an ACE inhibitor or ARB medication. In addition, he cannot be on a beta blocker because of recent episode of bradycardia.

## 2015-03-05 NOTE — Assessment & Plan Note (Signed)
History of carotid artery disease status post bilateral carotid endarterectomies at [facility] in [place] as well as bilateral carotid stenting in [place]. We will follow his carotids by duplex ultrasound. These were most recently performed 10/20/14 revealing high-grade "in-stent restenosis" within the right carotid stent and moderate left internal carotid artery stenosis.

## 2015-03-05 NOTE — Assessment & Plan Note (Signed)
History of hyperlipidemia on atorvastatin followed by his PCP 

## 2015-03-05 NOTE — Assessment & Plan Note (Signed)
History of CAD status post LAD, RCA and obtuse marginal branch stenting in [place]. He underwent aortic valve replacement with an [employer] life science model 3300 TFX bioprosthesis as well as a vein graft to the RCA by Dr. Servando Snare 06/14/12.

## 2015-03-18 ENCOUNTER — Ambulatory Visit: Payer: Medicare Other | Admitting: Cardiovascular Disease

## 2015-04-14 ENCOUNTER — Encounter: Payer: Self-pay | Admitting: Cardiovascular Disease

## 2015-04-14 ENCOUNTER — Ambulatory Visit (INDEPENDENT_AMBULATORY_CARE_PROVIDER_SITE_OTHER): Payer: PRIVATE HEALTH INSURANCE | Admitting: Cardiovascular Disease

## 2015-04-14 VITALS — BP 144/64 | HR 76 | Ht 65.5 in | Wt 146.0 lb

## 2015-04-14 DIAGNOSIS — Z79899 Other long term (current) drug therapy: Secondary | ICD-10-CM | POA: Diagnosis not present

## 2015-04-14 DIAGNOSIS — I5033 Acute on chronic diastolic (congestive) heart failure: Secondary | ICD-10-CM | POA: Diagnosis not present

## 2015-04-14 DIAGNOSIS — I6529 Occlusion and stenosis of unspecified carotid artery: Secondary | ICD-10-CM

## 2015-04-14 MED ORDER — FUROSEMIDE 80 MG PO TABS: 80.0000 mg | ORAL_TABLET | Freq: Every day | ORAL | Status: DC

## 2015-04-14 NOTE — Patient Instructions (Signed)
Medication Instructions:   Take an extra 40mg  of lasix in the afternoon every other day until weight is below 134lbs.   Labwork:  Have labs drawn in 2 weeks  Testing/Procedures:  n/a  Follow-Up:  2 months with an extender and 6 months with Dr Gwenlyn Found  Any Other Special Instructions Will Be Listed Below (If Applicable).

## 2015-04-14 NOTE — Assessment & Plan Note (Signed)
Collin Pearson returns today after being seen approximately a month ago for mild volume overload. He was admitted in June with volume overload and was diuresed. His baseline creatinine is in the 2 range. He admits to dietary indiscretion. His daughter was present during the interview. Her weight is up 3 or 4 pounds compared to a month ago. He is on Lasix 80 mg a day. I'm going to increase this to 40 mg in the evening every other day and will check blood work in 2 weeks. I told his daughter that once he reaches his dry weight of 132/133 pounds to drop the additional Lasix.

## 2015-04-14 NOTE — Progress Notes (Signed)
04/14/2015 Collin Pearson   10/29/1926  [id number]  Primary Physician Helen Hashimoto., MD Primary Cardiologist: Lorretta Harp MD [name]   HPI:  The patient is a delightful 81 year old thin appearing married Caucasian male who I last 1 month ago. He has a history of CAD status post stenting of his LAD, RCA, and OM branches in [place], [place]. He has had bilateral carotid endarterectomies at [facility] in [place] and bilateral carotid stenting in [place], [place]. His other problems include hypertension, hyperlipidemia, diabetes and chronic renal insufficiency. He had critical aortic stenosis with chest pain and dyspnea. Because of worsening carotid Dopplers I angiogrammed him May 18, 2012 revealing moderate in-stent restenosis within the right carotid stent. He ultimately underwent aortic valve replacement with an [place] TFX bioprosthesis as well as SVG to RCA by Dr. Ceasar Mons June 14, 2012. His followup 2D echo was normal. Tomorrow is his last day of cardiac rehab and he has recuperated nicely. His creatinines have remained stable. His other problems include hypertension, hyperlipidemia, and non-insulin-requiring diabetes. His most recent carotid Doppler performed in February revealed stable velocities on the right correlating to 50-60% in-stent restenosis. Since I saw him 12 months ago he he remains completely stable. He denies chest pain or shortness of breath.he had a recent 2-D echo showed normal systolic function with a well functioning aortic bioprosthesis. Recent carotid Doppler showed progression of disease in his right carotid stent probably in the 80-90% range. He is on aspirin Plavix and remains neurologically asymptomatic. His major complaint is of pain in his right second toe with a nonhealing ulcer probably related to PAD. His serum creatinines are elevated making intervention somewhat problematic.he has  been seeing the wound care center has been getting hyperbaric therapy. His wound is slowly healing. He also has had some bursitis and cellulitis in the right leg and is getting physical therapy.Collin Pearson was admitted to [facility] 02/03/15 for approximate one week because of profound bradycardia and hypotension. This was thought to be secondary to amiodarone and beta blocker therapy both which were just discontinued. He did have worsening of his chronic renal insufficiency which improved over time back to her baseline of approximately 2. Since I saw him in the office a month ago he's gained 3-4 pounds. He does admit to dietary indiscretion regarding salt. We talked about the importance of salt restriction. He does have 1+ pitting edema both lungs are clear and he denies shortness of breath. I'm going to slightly increase his diabetics next several weeks until he returns to his dry weight and I will discontinue the additional diuretic .   Current Outpatient Prescriptions  Medication Sig Dispense Refill  . amLODipine (NORVASC) 10 MG tablet Take 1 tablet (10 mg total) by mouth daily. 90 tablet 2  . aspirin EC 81 MG tablet Take 81 mg by mouth 2 (two) times daily.    Marland Kitchen atorvastatin (LIPITOR) 40 MG tablet TAKE 1 TABLET DAILY AT BEDTIME (Patient taking differently: No sig reported) 90 tablet 2  . BD INSULIN SYRINGE ULTRAFINE 31G X 5/16" 1 ML MISC     . clotrimazole-betamethasone (LOTRISONE) cream Apply 1 application topically daily as needed (dry skin, itching).     Marland Kitchen docusate sodium (STOOL SOFTENER) 100 MG capsule Take 100 mg by mouth 3 (three) times a week.    Marland Kitchen Epoetin Alfa (PROCRIT IJ) Inject 1 Dose as directed as needed.     . fish oil-omega-3 fatty acids  1000 MG capsule Take 1 g by mouth 3 (three) times daily.    . furosemide (LASIX) 80 MG tablet Take 1 tablet (80 mg total) by mouth daily. Take 40mg  of lasix in addition to the daily 80mg  every other day in the evening if weight goes above 134lbs     . hydrALAZINE (APRESOLINE) 25 MG tablet Take 3 tablets (75 mg total) by mouth 3 (three) times daily. 60 tablet 3  . Iron TABS Take 160 mg by mouth 2 (two) times daily.     . isosorbide mononitrate (IMDUR) 60 MG 24 hr tablet Take 1 tablet (60 mg total) by mouth daily. 30 tablet 1  . LEVEMIR 100 UNIT/ML injection Inject 20 Units into the skin at bedtime.    . Multiple Vitamin (MULTIVITAMIN WITH MINERALS) TABS Take 1 tablet by mouth daily.    . ONE TOUCH ULTRA TEST test strip     . pantoprazole (PROTONIX) 40 MG tablet Take 40 mg by mouth daily.    . silver sulfADIAZINE (SILVADENE) 1 % cream Apply 1 application topically as needed (dry skin).     . Tamsulosin HCl (FLOMAX) 0.4 MG CAPS Take 0.4 mg by mouth. One tablet in the morning and 1/2 tablet in the evening    . vitamin E (VITAMIN E) 400 UNIT capsule Take 400 Units by mouth daily.     No current facility-administered medications for this visit.    No Known Allergies  Social History   Social History  . Marital Status: Married    Spouse Name: N/A  . Number of Children: N/A  . Years of Education: N/A   Occupational History  . Not on file.   Social History Main Topics  . Smoking status: Never Smoker   . Smokeless tobacco: Never Used  . Alcohol Use: No  . Drug Use: No  . Sexual Activity: Not Currently   Other Topics Concern  . Not on file   Social History Narrative     Review of Systems: General: negative for chills, fever, night sweats or weight changes.  Cardiovascular: negative for chest pain, dyspnea on exertion, edema, orthopnea, palpitations, paroxysmal nocturnal dyspnea or shortness of breath Dermatological: negative for rash Respiratory: negative for cough or wheezing Urologic: negative for hematuria Abdominal: negative for nausea, vomiting, diarrhea, bright red blood per rectum, melena, or hematemesis Neurologic: negative for visual changes, syncope, or dizziness All other systems reviewed and are otherwise  negative except as noted above.    Blood pressure 144/64, pulse 76, height 5' 5.5" (1.664 m), weight 146 lb (66.225 kg).  General appearance: alert and no distress Neck: no adenopathy, no JVD, supple, symmetrical, trachea midline, thyroid not enlarged, symmetric, no tenderness/mass/nodules and bilateral carotid bruits Lungs: clear to auscultation bilaterally Heart: regular rate and rhythm, S1, S2 normal, no murmur, click, rub or gallop Extremities: 1+ bilateral lower extremity edema with some mild reddish discoloration  EKG not performed today  ASSESSMENT AND PLAN:   Acute on chronic diastolic CHF (congestive heart failure), NYHA class 1 Collin Pearson returns today after being seen approximately a month ago for mild volume overload. He was admitted in June with volume overload and was diuresed. His baseline creatinine is in the 2 range. He admits to dietary indiscretion. His daughter was present during the interview. Her weight is up 3 or 4 pounds compared to a month ago. He is on Lasix 80 mg a day. I'm going to increase this to 40 mg in the evening every other day  and will check blood work in 2 weeks. I told his daughter that once he reaches his dry weight of 132/133 pounds to drop the additional Lasix.      Lorretta Harp MD FACP,FACC,FAHA, [facility] 04/14/2015 2:54 PM

## 2015-05-01 ENCOUNTER — Other Ambulatory Visit: Payer: Self-pay | Admitting: Cardiovascular Disease

## 2015-05-01 NOTE — Telephone Encounter (Signed)
REFILL 

## 2015-05-13 ENCOUNTER — Other Ambulatory Visit: Payer: Self-pay | Admitting: Cardiovascular Disease

## 2015-05-13 DIAGNOSIS — I6523 Occlusion and stenosis of bilateral carotid arteries: Secondary | ICD-10-CM

## 2015-05-19 ENCOUNTER — Ambulatory Visit (HOSPITAL_COMMUNITY)
Admission: RE | Admit: 2015-05-19 | Discharge: 2015-05-19 | Disposition: A | Discharge status: Home / Self Care | Payer: PRIVATE HEALTH INSURANCE | Source: Ambulatory Visit | Attending: Cardiovascular Disease | Admitting: Cardiovascular Disease

## 2015-05-19 DIAGNOSIS — I129 Hypertensive chronic kidney disease with stage 1 through stage 4 chronic kidney disease, or unspecified chronic kidney disease: Secondary | ICD-10-CM | POA: Insufficient documentation

## 2015-05-19 DIAGNOSIS — N184 Chronic kidney disease, stage 4 (severe): Secondary | ICD-10-CM | POA: Insufficient documentation

## 2015-05-19 DIAGNOSIS — I2510 Atherosclerotic heart disease of native coronary artery without angina pectoris: Secondary | ICD-10-CM | POA: Insufficient documentation

## 2015-05-19 DIAGNOSIS — E119 Type 2 diabetes mellitus without complications: Secondary | ICD-10-CM | POA: Insufficient documentation

## 2015-05-19 DIAGNOSIS — I6523 Occlusion and stenosis of bilateral carotid arteries: Secondary | ICD-10-CM | POA: Diagnosis not present

## 2015-06-02 ENCOUNTER — Encounter: Payer: Self-pay | Admitting: Cardiovascular Disease

## 2015-06-02 ENCOUNTER — Ambulatory Visit (INDEPENDENT_AMBULATORY_CARE_PROVIDER_SITE_OTHER): Payer: PRIVATE HEALTH INSURANCE | Admitting: Cardiovascular Disease

## 2015-06-02 VITALS — BP 122/64 | HR 70 | Ht 65.5 in | Wt 144.4 lb

## 2015-06-02 DIAGNOSIS — I10 Essential (primary) hypertension: Secondary | ICD-10-CM | POA: Diagnosis not present

## 2015-06-02 DIAGNOSIS — I5033 Acute on chronic diastolic (congestive) heart failure: Secondary | ICD-10-CM | POA: Diagnosis not present

## 2015-06-02 DIAGNOSIS — I6523 Occlusion and stenosis of bilateral carotid arteries: Secondary | ICD-10-CM | POA: Diagnosis not present

## 2015-06-02 DIAGNOSIS — Z79899 Other long term (current) drug therapy: Secondary | ICD-10-CM | POA: Diagnosis not present

## 2015-06-02 LAB — BASIC METABOLIC PANEL
BUN: 48 mg/dL — AB (ref 7–25)
CALCIUM: 8.8 mg/dL (ref 8.6–10.3)
CHLORIDE: 106 mmol/L (ref 98–110)
CO2: 21 mmol/L (ref 20–31)
Creat: 2.51 mg/dL — ABNORMAL HIGH (ref 0.70–1.11)
GLUCOSE: 201 mg/dL — AB (ref 65–99)
POTASSIUM: 5 mmol/L (ref 3.5–5.3)
SODIUM: 137 mmol/L (ref 135–146)

## 2015-06-02 NOTE — Assessment & Plan Note (Signed)
Collin Pearson has diastolic heart failure. I saw him in the office 6 weeks ago was started Lasix for peripheral edema which has resolved. He is not short of breath. We will recheck a basic metabolic panel today.

## 2015-06-02 NOTE — Assessment & Plan Note (Signed)
History of carotid artery disease status post bilateral carotid endarterectomies as well as bilateral carotid stenting in [facility] and [facility] been following his duplex ultrasound which showed significant progression of disease most recently on 05/19/15 on the right side compared to the previous one 6 months before. He is neurologically asymptomatic on aspirin. I'm hesitant to recommend any revascularize procedure including restenting since there is very little precedent for this.

## 2015-06-02 NOTE — Patient Instructions (Signed)
Medication Instructions:  Your physician recommends that you continue on your current medications as directed. Please refer to the Current Medication list given to you today.   Labwork: Your physician recommends that you return for lab work in: [facility]) The lab can be found on the FIRST FLOOR of out building in [address]   Testing/Procedures: Your physician has requested that you have a carotid duplex. This test is an ultrasound of the carotid arteries in your neck. It looks at blood flow through these arteries that supply the brain with blood. Allow one hour for this exam. There are no restrictions or special instructions. IN 6 MONTHS - SCHEDULE SO CAN BE RESULTED BEFORE NEXT APPT WITH DR. Gwenlyn Found.    Follow-Up: Your physician wants you to follow-up in: 6 MONTHS WITH DR. Andria Rhein will receive a reminder letter in the mail two months in advance. If you don't receive a letter, please call our office to schedule the follow-up appointment.   Any Other Special Instructions Will Be Listed Below (If Applicable).

## 2015-06-02 NOTE — Progress Notes (Signed)
06/02/2015 Collin Pearson   1927/05/15  [id number]  Primary Physician Helen Hashimoto., MD Primary Cardiologist: Lorretta Harp MD [name]   HPI:  The patient is a delightful 81 year old thin appearing married Caucasian male who I last 1 month ago. He has a history of CAD status post stenting of his LAD, RCA, and OM branches in [place], [place]. He has had bilateral carotid endarterectomies at [facility] in [place] and bilateral carotid stenting in [place], [place]. His other problems include hypertension, hyperlipidemia, diabetes and chronic renal insufficiency. He had critical aortic stenosis with chest pain and dyspnea. Because of worsening carotid Dopplers I angiogrammed him May 18, 2012 revealing moderate in-stent restenosis within the right carotid stent. He ultimately underwent aortic valve replacement with an [place] TFX bioprosthesis as well as SVG to RCA by Dr. Ceasar Mons June 14, 2012. His followup 2D echo was normal. Tomorrow is his last day of cardiac rehab and he has recuperated nicely. His creatinines have remained stable. His other problems include hypertension, hyperlipidemia, and non-insulin-requiring diabetes. His most recent carotid Doppler performed in February revealed stable velocities on the right correlating to 50-60% in-stent restenosis. Since I saw him 12 months ago he he remains completely stable. He denies chest pain or shortness of breath.he had a recent 2-D echo showed normal systolic function with a well functioning aortic bioprosthesis. Recent carotid Doppler showed progression of disease in his right carotid stent probably in the 80-90% range. He is on aspirin Plavix and remains neurologically asymptomatic. His major complaint is of pain in his right second toe with a nonhealing ulcer probably related to PAD. His serum creatinines are elevated making intervention somewhat problematic.he has  been seeing the wound care center has been getting hyperbaric therapy. His wound is slowly healing. He also has had some bursitis and cellulitis in the right leg and is getting physical therapy.Collin Pearson was admitted to [facility] 02/03/15 for approximate one week because of profound bradycardia and hypotension. This was thought to be secondary to amiodarone and beta blocker therapy both which were just discontinued. He did have worsening of his chronic renal insufficiency which improved over time back to her baseline of approximately 2. Since I saw him in the office a month ago he's gained 3-4 pounds. He does admit to dietary indiscretion regarding salt. We talked about the importance of salt restriction. When I saw him on 04/14/15 he did have some peripheral edema. I increased his diuretic and his edema has resolved. He is not short of breath. Carotid Dopplers performed 05/19/15 showed significant progression of disease on the right suggesting "in-stent restenosis although he is neurologically asymptomatic.  Current Outpatient Prescriptions  Medication Sig Dispense Refill  . amLODipine (NORVASC) 10 MG tablet Take 1 tablet (10 mg total) by mouth daily. 90 tablet 2  . aspirin EC 81 MG tablet Take 81 mg by mouth 2 (two) times daily.    Marland Kitchen atorvastatin (LIPITOR) 40 MG tablet TAKE 1 TABLET DAILY AT BEDTIME 90 tablet 2  . BD INSULIN SYRINGE ULTRAFINE 31G X 5/16" 1 ML MISC     . clotrimazole-betamethasone (LOTRISONE) cream Apply 1 application topically daily as needed (dry skin, itching).     Marland Kitchen docusate sodium (STOOL SOFTENER) 100 MG capsule Take 100 mg by mouth 3 (three) times a week.    Marland Kitchen Epoetin Alfa (PROCRIT IJ) Inject 1 Dose as directed as needed.     . fish oil-omega-3 fatty acids 1000  MG capsule Take 1 g by mouth 3 (three) times daily.    . furosemide (LASIX) 80 MG tablet Take 1 tablet (80 mg total) by mouth daily. Take additional 40mg  every other day in the evening if weight goes above 134lbs  135 tablet 3  . hydrALAZINE (APRESOLINE) 25 MG tablet Take 3 tablets (75 mg total) by mouth 3 (three) times daily. 60 tablet 3  . Iron TABS Take 160 mg by mouth 2 (two) times daily.     . isosorbide mononitrate (IMDUR) 60 MG 24 hr tablet Take 1 tablet (60 mg total) by mouth daily. 30 tablet 1  . LEVEMIR 100 UNIT/ML injection Inject 20 Units into the skin at bedtime.    . Multiple Vitamin (MULTIVITAMIN WITH MINERALS) TABS Take 1 tablet by mouth daily.    . ONE TOUCH ULTRA TEST test strip     . pantoprazole (PROTONIX) 40 MG tablet Take 40 mg by mouth daily.    . silver sulfADIAZINE (SILVADENE) 1 % cream Apply 1 application topically as needed (dry skin).     . Tamsulosin HCl (FLOMAX) 0.4 MG CAPS Take 0.4 mg by mouth. One tablet in the morning and 1/2 tablet in the evening    . vitamin E (VITAMIN E) 400 UNIT capsule Take 400 Units by mouth daily.     No current facility-administered medications for this visit.    No Known Allergies  Social History   Social History  . Marital Status: Married    Spouse Name: N/A  . Number of Children: N/A  . Years of Education: N/A   Occupational History  . Not on file.   Social History Main Topics  . Smoking status: Never Smoker   . Smokeless tobacco: Never Used  . Alcohol Use: No  . Drug Use: No  . Sexual Activity: Not Currently   Other Topics Concern  . Not on file   Social History Narrative     Review of Systems: General: negative for chills, fever, night sweats or weight changes.  Cardiovascular: negative for chest pain, dyspnea on exertion, edema, orthopnea, palpitations, paroxysmal nocturnal dyspnea or shortness of breath Dermatological: negative for rash Respiratory: negative for cough or wheezing Urologic: negative for hematuria Abdominal: negative for nausea, vomiting, diarrhea, bright red blood per rectum, melena, or hematemesis Neurologic: negative for visual changes, syncope, or dizziness All other systems reviewed and are  otherwise negative except as noted above.    Blood pressure 122/64, pulse 70, height 5' 5.5" (1.664 m), weight 144 lb 6.4 oz (65.499 kg).  General appearance: alert and no distress Neck: no adenopathy, no JVD, supple, symmetrical, trachea midline, thyroid not enlarged, symmetric, no tenderness/mass/nodules and bilateral carotid bruits Lungs: clear to auscultation bilaterally Heart: regular rate and rhythm, S1, S2 normal, no murmur, click, rub or gallop Extremities: extremities normal, atraumatic, no cyanosis or edema  EKG not performed today  ASSESSMENT AND PLAN:   PVD, with hx of bilateral CEA at [facility], and previous bilateral carotid stenting, with high grade ISR of Rt. carotid 05/18/12 History of carotid artery disease status post bilateral carotid endarterectomies as well as bilateral carotid stenting in [facility] and [facility]. [place] been following his duplex ultrasound which showed significant progression of disease most recently on 05/19/15 on the right side compared to the previous one 6 months before. He is neurologically asymptomatic on aspirin. I'm hesitant to recommend any revascularize procedure including restenting since there is very little precedent for this.  Acute on chronic diastolic CHF (congestive  heart failure), NYHA class 1 Collin Pearson has diastolic heart failure. I saw him in the office 6 weeks ago was started Lasix for peripheral edema which has resolved. He is not short of breath. We will recheck a basic metabolic panel today.      Lorretta Harp MD FACP,FACC,FAHA, [facility] 06/02/2015 3:17 PM

## 2015-06-08 ENCOUNTER — Telehealth: Payer: Self-pay | Admitting: Cardiovascular Disease

## 2015-06-08 DIAGNOSIS — Z79899 Other long term (current) drug therapy: Secondary | ICD-10-CM

## 2015-06-08 NOTE — Telephone Encounter (Signed)
Spoke with patient and informed him of lab results. Repeat BMET ordered. He states he will have blood work Nov 8.

## 2015-06-08 NOTE — Telephone Encounter (Signed)
Pt returning call from [place] [facility] it was about his test results.

## 2015-09-15 ENCOUNTER — Telehealth: Payer: Self-pay | Admitting: Cardiovascular Disease

## 2015-09-15 NOTE — Telephone Encounter (Signed)
Lm FOR REHAB  TO FAX FORM TO OFFICE  /CY

## 2015-09-15 NOTE — Telephone Encounter (Signed)
New message     Pt want to know if Dr Gwenlyn Found would send an order in to [facility] to participate in their cardiopulm rehab program?  If yes, please fax it to [phone].  Call pt and let him know what he said.

## 2015-09-18 NOTE — Telephone Encounter (Signed)
FORWARD TO [facility] RN

## 2015-09-22 NOTE — Telephone Encounter (Signed)
Paperwork received; awaiting MD signature

## 2015-10-01 NOTE — Telephone Encounter (Signed)
Paperwork faxed °

## 2015-11-09 ENCOUNTER — Telehealth: Payer: Self-pay | Admitting: Cardiovascular Disease

## 2015-11-09 NOTE — Telephone Encounter (Signed)
Dr Ronnald Ramp wants him to be seen soon than April 21 appointment. This appt needs to be moved up because of Ischemic left foot and leg pain with non palpable pulses.

## 2015-11-20 ENCOUNTER — Ambulatory Visit (INDEPENDENT_AMBULATORY_CARE_PROVIDER_SITE_OTHER): Payer: BLUE CROSS/BLUE SHIELD | Admitting: Cardiovascular Disease

## 2015-11-20 ENCOUNTER — Encounter: Payer: Self-pay | Admitting: Cardiovascular Disease

## 2015-11-20 VITALS — BP 130/58 | HR 74 | Ht 65.0 in | Wt 144.6 lb

## 2015-11-20 DIAGNOSIS — I10 Essential (primary) hypertension: Secondary | ICD-10-CM | POA: Diagnosis not present

## 2015-11-20 DIAGNOSIS — E785 Hyperlipidemia, unspecified: Secondary | ICD-10-CM | POA: Diagnosis not present

## 2015-11-20 DIAGNOSIS — I5033 Acute on chronic diastolic (congestive) heart failure: Secondary | ICD-10-CM | POA: Diagnosis not present

## 2015-11-20 DIAGNOSIS — I739 Peripheral vascular disease, unspecified: Secondary | ICD-10-CM | POA: Diagnosis not present

## 2015-11-20 NOTE — Progress Notes (Signed)
11/20/2015 Collin Pearson   04-04-1927  [id number]  Primary Physician Helen Hashimoto., MD Primary Cardiologist: Lorretta Harp MD [name]   HPI:  The patient is a delightful 82 year old thin appearing married Caucasian male who I last saw in the office 06/02/15. He has a history of CAD status post stenting of his LAD, RCA, and OM branches in [place], [place]. He has had bilateral carotid endarterectomies at [facility] in [place] and bilateral carotid stenting in [place], [place]. His other problems include hypertension, hyperlipidemia, diabetes and chronic renal insufficiency. He had critical aortic stenosis with chest pain and dyspnea. Because of worsening carotid Dopplers I angiogrammed him May 18, 2012 revealing moderate in-stent restenosis within the right carotid stent. He ultimately underwent aortic valve replacement with an [place] TFX bioprosthesis as well as SVG to RCA by Dr. Ceasar Mons June 14, 2012. His followup 2D echo was normal. Tomorrow is his last day of cardiac rehab and he has recuperated nicely. His creatinines have remained stable. His other problems include hypertension, hyperlipidemia, and non-insulin-requiring diabetes. His most recent carotid Doppler performed in February revealed stable velocities on the right correlating to 50-60% in-stent restenosis. Since I saw him 12 months ago he he remains completely stable. He denies chest pain or shortness of breath.he had a recent 2-D echo showed normal systolic function with a well functioning aortic bioprosthesis. Recent carotid Doppler showed progression of disease in his right carotid stent probably in the 80-90% range. He is on aspirin Plavix and remains neurologically asymptomatic. His major complaint is of pain in his right second toe with a nonhealing ulcer probably related to PAD. His serum creatinines are elevated making intervention somewhat  problematic.he has been seeing the wound care center has been getting hyperbaric therapy. His wound is slowly healing. He also has had some bursitis and cellulitis in the right leg and is getting physical therapy.Mr. Visconti was admitted to [facility] 02/03/15 for approximate one week because of profound bradycardia and hypotension. This was thought to be secondary to amiodarone and beta blocker therapy both which were just discontinued. He did have worsening of his chronic renal insufficiency which improved over time back to her baseline of approximately 2. Since I saw him in the office a month ago he's gained 3-4 pounds. He does admit to dietary indiscretion regarding salt. We talked about the importance of salt restriction. Since I saw him in the office in October of last year he remained clinically stable. His weights are stable. He denies chest pain or shortness of breath. His major complaint is of right foot pain worse when he is recumbent. I suspect this may be related to peripheral vascular disease. We will get lower extremity arterial Doppler studies.   Current Outpatient Prescriptions  Medication Sig Dispense Refill  . amLODipine (NORVASC) 10 MG tablet Take 1 tablet (10 mg total) by mouth daily. 90 tablet 2  . aspirin EC 81 MG tablet Take 81 mg by mouth 2 (two) times daily.    Marland Kitchen atorvastatin (LIPITOR) 40 MG tablet TAKE 1 TABLET DAILY AT BEDTIME 90 tablet 2  . BD INSULIN SYRINGE ULTRAFINE 31G X 5/16" 1 ML MISC     . clotrimazole-betamethasone (LOTRISONE) cream Apply 1 application topically daily as needed (dry skin, itching).     Marland Kitchen docusate sodium (STOOL SOFTENER) 100 MG capsule Take 100 mg by mouth 3 (three) times a week.    Marland Kitchen Epoetin Alfa (PROCRIT IJ) Inject 1  Dose as directed as needed.     . fish oil-omega-3 fatty acids 1000 MG capsule Take 1 g by mouth 3 (three) times daily.    . furosemide (LASIX) 80 MG tablet Take 1 tablet (80 mg total) by mouth daily. Take additional 40mg  every  other day in the evening if weight goes above 134lbs 135 tablet 3  . glipiZIDE (GLUCOTROL) 10 MG tablet Take 10 mg by mouth 2 (two) times daily.    . hydrALAZINE (APRESOLINE) 25 MG tablet Take 3 tablets (75 mg total) by mouth 3 (three) times daily. 60 tablet 3  . Iron TABS Take 160 mg by mouth 2 (two) times daily.     . isosorbide mononitrate (IMDUR) 60 MG 24 hr tablet Take 1 tablet (60 mg total) by mouth daily. 30 tablet 1  . LEVEMIR 100 UNIT/ML injection Inject 20 Units into the skin at bedtime.    . Multiple Vitamin (MULTIVITAMIN WITH MINERALS) TABS Take 1 tablet by mouth daily.    . ONE TOUCH ULTRA TEST test strip     . pantoprazole (PROTONIX) 40 MG tablet Take 40 mg by mouth daily.    . silver sulfADIAZINE (SILVADENE) 1 % cream Apply 1 application topically as needed (dry skin).     . Tamsulosin HCl (FLOMAX) 0.4 MG CAPS Take 0.4 mg by mouth. One tablet in the morning and 1/2 tablet in the evening    . Vitamin D, Ergocalciferol, (DRISDOL) 50000 units CAPS capsule Take 50,000 Units by mouth every 7 (seven) days.    . vitamin E (VITAMIN E) 400 UNIT capsule Take 400 Units by mouth daily.     No current facility-administered medications for this visit.    No Known Allergies  Social History   Social History  . Marital Status: Married    Spouse Name: N/A  . Number of Children: N/A  . Years of Education: N/A   Occupational History  . Not on file.   Social History Main Topics  . Smoking status: Never Smoker   . Smokeless tobacco: Never Used  . Alcohol Use: No  . Drug Use: No  . Sexual Activity: Not Currently   Other Topics Concern  . Not on file   Social History Narrative     Review of Systems: General: negative for chills, fever, night sweats or weight changes.  Cardiovascular: negative for chest pain, dyspnea on exertion, edema, orthopnea, palpitations, paroxysmal nocturnal dyspnea or shortness of breath Dermatological: negative for rash Respiratory: negative for cough or  wheezing Urologic: negative for hematuria Abdominal: negative for nausea, vomiting, diarrhea, bright red blood per rectum, melena, or hematemesis Neurologic: negative for visual changes, syncope, or dizziness All other systems reviewed and are otherwise negative except as noted above.    Blood pressure 130/58, pulse 74, height 5\' 5"  (1.651 m), weight 144 lb 9.6 oz (65.59 kg).  General appearance: alert and no distress Neck: no adenopathy, no JVD, supple, symmetrical, trachea midline, thyroid not enlarged, symmetric, no tenderness/mass/nodules and bilateral carotid bruits Lungs: clear to auscultation bilaterally Heart: soft outflow tract murmur consistent with aortic stenosis Extremities: extremities normal, atraumatic, no cyanosis or edema  EKG normal sections 74 with right bundle branch block. Personally reviewed this EKG  ASSESSMENT AND PLAN:   PVD, with hx of bilateral CEA at [facility], and previous bilateral carotid stenting, with high grade ISR of Rt. carotid 05/18/12 History of carotid artery artery disease status post bilateral carotid endarterectomies performed at [facility] in  [place] and subsequent  bilateral carotid stenting performed at [place]. Please remain neurologically symptomatically. I angiogramed him 05/18/2012 revealing moderate in-stent restenosis within the right carotid stent. We've been following this by duplex ultrasound which most recently performed 05/19/15 has revealed progression of disease on the right side. Unfortunately, he has moderate renal insufficiency as well making intervention somewhat high risk. He is on aspirin and not on Plavix although he is anemic in addition which would make appropriate therapy problematic as well.  HTN (hypertension) History of hypertension blood pressure measured at 130/58. He is on amlodipine, and hydralazine. 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 He had aortic valve replacement and RCA bypass by Dr. Servando Snare 06/14/12 with an [place] 3300 TFX bioprosthesis.he denies chest pain or shortness of breath.  Acute on chronic diastolic CHF (congestive heart failure), NYHA class 1 History of diastolic heart failure in the past with echo performed in June of last year that showed preserved LV function with a well functioning aortic bioprosthesis.      Lorretta Harp MD FACP,FACC,FAHA, [facility] 11/20/2015 8:36 AM

## 2015-11-20 NOTE — Assessment & Plan Note (Signed)
History of hypertension blood pressure measured at 130/58. He is on amlodipine, and hydralazine. Continue current meds current dosing

## 2015-11-20 NOTE — Assessment & Plan Note (Signed)
History of dyslipidemia on atorvastatin followed by his PCP 

## 2015-11-20 NOTE — Assessment & Plan Note (Signed)
History of diastolic heart failure in the past with echo performed in June of last year that showed preserved LV function with a well functioning aortic bioprosthesis.

## 2015-11-20 NOTE — Assessment & Plan Note (Signed)
History of coronary artery disease and aortic stenosis status post stenting of his LAD, RCA and obtuse marginal branches in [place] bridging in the past. He had aortic valve replacement and RCA bypass by Dr. Servando Snare 06/14/12 with an [place] 3300 TFX bioprosthesis.he denies chest pain or shortness of breath.

## 2015-11-20 NOTE — Assessment & Plan Note (Signed)
History of carotid artery artery disease status post bilateral carotid endarterectomies performed at [facility] in  [place] and subsequent bilateral carotid stenting performed at [place]. Please remain neurologically symptomatically. I angiogramed him 05/18/2012 revealing moderate in-stent restenosis within the right carotid stent. We've been following this by duplex ultrasound which most recently performed 05/19/15 has revealed progression of disease on the right side. Unfortunately, he has moderate renal insufficiency as well making intervention somewhat high risk. He is on aspirin and not on Plavix although he is anemic in addition which would make appropriate therapy problematic as well.

## 2015-11-20 NOTE — Patient Instructions (Signed)
Medication Instructions:  Your physician recommends that you continue on your current medications as directed. Please refer to the Current Medication list given to you today.   Labwork: I will get your lab work from your Primary Care Physician.   Testing/Procedures: Your physician has requested that you have a lower extremity arterial doppler- During this test, ultrasound is used to evaluate arterial blood flow in the legs. Allow approximately one hour for this exam.    Follow-Up: Your physician wants you to follow-up in: 6 months with Dr. Gwenlyn Found. You will receive a reminder letter in the mail two months in advance. If you don't receive a letter, please call our office to schedule the follow-up appointment.   Any Other Special Instructions Will Be Listed Below (If Applicable).     If you need a refill on your cardiac medications before your next appointment, please call your pharmacy.

## 2015-11-26 ENCOUNTER — Other Ambulatory Visit: Payer: Self-pay | Admitting: Cardiovascular Disease

## 2015-11-26 DIAGNOSIS — I739 Peripheral vascular disease, unspecified: Secondary | ICD-10-CM

## 2015-12-04 ENCOUNTER — Ambulatory Visit: Payer: Medicare Other | Admitting: Cardiovascular Disease

## 2015-12-04 ENCOUNTER — Inpatient Hospital Stay (HOSPITAL_COMMUNITY): Admission: RE | Admit: 2015-12-04 | Payer: Medicare Other | Source: Ambulatory Visit

## 2015-12-04 ENCOUNTER — Ambulatory Visit (HOSPITAL_COMMUNITY)
Admission: RE | Admit: 2015-12-04 | Discharge: 2015-12-04 | Disposition: A | Discharge status: Home / Self Care | Payer: BLUE CROSS/BLUE SHIELD | Source: Ambulatory Visit | Attending: Cardiovascular Disease | Admitting: Cardiovascular Disease

## 2015-12-04 DIAGNOSIS — I739 Peripheral vascular disease, unspecified: Secondary | ICD-10-CM | POA: Insufficient documentation

## 2015-12-04 DIAGNOSIS — K219 Gastro-esophageal reflux disease without esophagitis: Secondary | ICD-10-CM | POA: Diagnosis not present

## 2015-12-04 DIAGNOSIS — N184 Chronic kidney disease, stage 4 (severe): Secondary | ICD-10-CM | POA: Insufficient documentation

## 2015-12-04 DIAGNOSIS — I6523 Occlusion and stenosis of bilateral carotid arteries: Secondary | ICD-10-CM | POA: Insufficient documentation

## 2015-12-04 DIAGNOSIS — I10 Essential (primary) hypertension: Secondary | ICD-10-CM | POA: Diagnosis not present

## 2015-12-04 DIAGNOSIS — E1122 Type 2 diabetes mellitus with diabetic chronic kidney disease: Secondary | ICD-10-CM | POA: Diagnosis not present

## 2015-12-04 DIAGNOSIS — Z79899 Other long term (current) drug therapy: Secondary | ICD-10-CM | POA: Insufficient documentation

## 2015-12-04 DIAGNOSIS — I129 Hypertensive chronic kidney disease with stage 1 through stage 4 chronic kidney disease, or unspecified chronic kidney disease: Secondary | ICD-10-CM | POA: Diagnosis not present

## 2015-12-04 DIAGNOSIS — I70202 Unspecified atherosclerosis of native arteries of extremities, left leg: Secondary | ICD-10-CM | POA: Insufficient documentation

## 2015-12-10 ENCOUNTER — Other Ambulatory Visit: Payer: Self-pay | Admitting: Cardiovascular Disease

## 2015-12-10 ENCOUNTER — Telehealth: Payer: Self-pay

## 2015-12-10 DIAGNOSIS — I720 Aneurysm of carotid artery: Secondary | ICD-10-CM

## 2015-12-10 NOTE — Telephone Encounter (Signed)
-----   Message from Lorretta Harp, MD sent at 12/06/2015  3:10 PM EDT ----- No change from prior study. Repeat in 6 months

## 2015-12-10 NOTE — Telephone Encounter (Signed)
Rx has been sent to the pharmacy electronically. ° °

## 2015-12-14 ENCOUNTER — Telehealth: Payer: Self-pay | Admitting: Cardiovascular Disease

## 2015-12-14 NOTE — Telephone Encounter (Signed)
Spoke to this patient concerning his recent lower extremity vascular study. He was somewhat anxious, upset, because he had heard on results of carotid but states he did not have discussion about the lower extremity study.  I communicated these results to him. He states further that he's been very frustrated w/ amount and frequency of lower extremity pain he's been dealing with. 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 States last few nights amount of pain in his legs/feet has prevented him from getting restful sleep. I expressed my sympathy and concerns. Let patient know that his tests results should be interpreted as encouraging, and I would talk to Dr. Hale Bogus office (PCP) to see if further help could be offered. Noted hx of IDDM. Pt does endorse a known history or discussion w/ PCP of neuropathy in past.  I called Dr. Hale Bogus office. Spoke to [place], Therapist, sports w/ provider. She expressed familiarity w/ patient and understanding regarding his concerns. Informed me she would relay concerns to Dr. Megan Salon who would reach out to patient w further instructions. I called patient back and informed him of this. He was pleased and voiced thanks.

## 2015-12-14 NOTE — Telephone Encounter (Signed)
Pt called in wanting to know what the results were to his Lower arterial doppler was because he is complaining of pain in his feet. Please f/u with him  Thanks

## 2016-01-29 ENCOUNTER — Other Ambulatory Visit: Payer: Self-pay | Admitting: Cardiovascular Disease

## 2016-01-29 NOTE — Telephone Encounter (Signed)
Rx Refill

## 2016-02-02 ENCOUNTER — Other Ambulatory Visit: Payer: Self-pay

## 2016-02-02 ENCOUNTER — Encounter: Payer: Self-pay | Admitting: Vascular Surgery

## 2016-02-02 ENCOUNTER — Ambulatory Visit (INDEPENDENT_AMBULATORY_CARE_PROVIDER_SITE_OTHER): Payer: BLUE CROSS/BLUE SHIELD | Admitting: Vascular Surgery

## 2016-02-02 VITALS — BP 150/68 | HR 75 | Ht 65.0 in | Wt 139.0 lb

## 2016-02-02 DIAGNOSIS — I70262 Atherosclerosis of native arteries of extremities with gangrene, left leg: Secondary | ICD-10-CM

## 2016-02-02 DIAGNOSIS — I998 Other disorder of circulatory system: Secondary | ICD-10-CM | POA: Diagnosis not present

## 2016-02-02 DIAGNOSIS — I6523 Occlusion and stenosis of bilateral carotid arteries: Secondary | ICD-10-CM

## 2016-02-02 DIAGNOSIS — I70229 Atherosclerosis of native arteries of extremities with rest pain, unspecified extremity: Secondary | ICD-10-CM

## 2016-02-02 NOTE — Progress Notes (Signed)
Vascular and Vein Specialist of [place]  Patient name: Collin Pearson MRN: [id number] DOB: [birth year] Sex: male  REASON FOR CONSULT: Gangrenous toes left foot  HPI: Collin Pearson is a 82 y.o. male, who is seen today for urgent evaluation regarding gangrenous changes in his left foot. He is here today with his wife and 2 daughters. He and they report that up until one month ago he was ambulatory. He is very frail and has a multitude of medical problems including critical coronary disease and stage IV kidney disease. The last month he has had a rapidly progressive gangrenous changes of his left foot. He was seen in the [place] wound center and was referred to [name]. Does have a history of prior aortic valve replacement with a tissue valve and is not on anticoagulation. He now has developed gangrene of all the toes of his left foot with severe erythema and also breakdown of the pretibial tissue above his ankle on the left. Right leg does have significant swelling but no skin breakdown.  Past history is significant for remote history of bilateral carotid endarterectomy at [facility] Also subsequently had recurrences in bilateral carotid stenting in [facility].  Past Medical History  Diagnosis Date  . Clotting disorder ([place])   . Coronary artery disease   . AS (aortic stenosis)     status post aortic valve replacement with an [employer] model 14 T. fracture bioprosthesis  . Anginal pain ([place])   . Hypertension   . Diabetes mellitus   . Peripheral vascular disease ([place])   . GERD (gastroesophageal reflux disease)   . CKD (chronic kidney disease) stage 4, GFR 15-29 ml/min (HCC)   . PVD (peripheral vascular disease), with hsx of bilateral carotid endarterectomies at [facility], and bilateral carotid stenting 05/18/2012  . Heart murmur   . PAF (paroxysmal atrial fibrillation), s/p cabg 06/18/2012  . Critical lower limb ischemia      Family History  Problem Relation Age of Onset  . Heart disease Mother 32  . Heart disease Father 74  . CAD Brother   . Heart disease Maternal Uncle   . Heart disease Paternal Uncle   . Heart disease Maternal Grandfather 68    SOCIAL HISTORY: Social History   Social History  . Marital Status: Married    Spouse Name: N/A  . Number of Children: N/A  . Years of Education: N/A   Occupational History  . Not on file.   Social History Main Topics  . Smoking status: Never Smoker   . Smokeless tobacco: Never Used  . Alcohol Use: No  . Drug Use: No  . Sexual Activity: Not Currently   Other Topics Concern  . Not on file   Social History Narrative    No Known Allergies  Current Outpatient Prescriptions  Medication Sig Dispense Refill  . amLODipine (NORVASC) 10 MG tablet TAKE 1 TABLET DAILY 90 tablet 2  . aspirin EC 81 MG tablet Take 81 mg by mouth 2 (two) times daily.    Marland Kitchen atorvastatin (LIPITOR) 40 MG tablet TAKE 1 TABLET DAILY AT BEDTIME 90 tablet 1  . fish oil-omega-3 fatty acids 1000 MG capsule Take 1 g by mouth 3 (three) times daily. Reported on 02/02/2016    . furosemide (LASIX) 80 MG tablet Take 1 tablet (80 mg total) by mouth daily. Take additional 40mg  every other day in the evening if weight goes above 134lbs 135 tablet 3  . hydrALAZINE (APRESOLINE) 25 MG tablet  Take 3 tablets (75 mg total) by mouth 3 (three) times daily. 60 tablet 3  . Iron TABS Take 160 mg by mouth 2 (two) times daily.     . isosorbide mononitrate (IMDUR) 60 MG 24 hr tablet Take 1 tablet (60 mg total) by mouth daily. 30 tablet 1  . LEVEMIR 100 UNIT/ML injection Inject 20 Units into the skin at bedtime.    . Multiple Vitamin (MULTIVITAMIN WITH MINERALS) TABS Take 1 tablet by mouth daily.    Marland Kitchen oxyCODONE-acetaminophen (PERCOCET) 10-325 MG tablet Take 1 tablet by mouth every 4 (four) hours as needed for pain.    . pantoprazole (PROTONIX) 40 MG tablet Take 40 mg by mouth daily.    Marland Kitchen  sulfamethoxazole-trimethoprim (BACTRIM DS) 800-160 MG tablet Take 1 tablet by mouth 2 (two) times daily.    . Tamsulosin HCl (FLOMAX) 0.4 MG CAPS Take 0.4 mg by mouth. One tablet in the morning and 1/2 tablet in the evening    . vitamin E (VITAMIN E) 400 UNIT capsule Take 400 Units by mouth daily. Reported on 02/02/2016    . BD INSULIN SYRINGE ULTRAFINE 31G X 5/16" 1 ML MISC Reported on 02/02/2016    . clotrimazole-betamethasone (LOTRISONE) cream Apply 1 application topically daily as needed (dry skin, itching). Reported on 02/02/2016    . docusate sodium (STOOL SOFTENER) 100 MG capsule Take 100 mg by mouth 3 (three) times a week. Reported on 02/02/2016    . Epoetin Alfa (PROCRIT IJ) Inject 1 Dose as directed as needed. Reported on 02/02/2016    . glipiZIDE (GLUCOTROL) 10 MG tablet Take 10 mg by mouth 2 (two) times daily. Reported on 02/02/2016    . ONE TOUCH ULTRA TEST test strip Reported on 02/02/2016    . silver sulfADIAZINE (SILVADENE) 1 % cream Apply 1 application topically as needed (dry skin). Reported on 02/02/2016    . Vitamin D, Ergocalciferol, (DRISDOL) 50000 units CAPS capsule Take 50,000 Units by mouth every 7 (seven) days. Reported on 02/02/2016     No current facility-administered medications for this visit.    REVIEW OF SYSTEMS:  [X]  denotes positive finding, [ ]  denotes negative finding Cardiac  Comments:  Chest pain or chest pressure:    Shortness of breath upon exertion:    Short of breath when lying flat:    Irregular heart rhythm:        Vascular    Pain in calf, thigh, or hip brought on by ambulation: x   Pain in feet at night that wakes you up from your sleep:     Blood clot in your veins:    Leg swelling:  x       Pulmonary    Oxygen at home:    Productive cough:     Wheezing:         Neurologic    Sudden weakness in arms or legs:     Sudden numbness in arms or legs:     Sudden onset of difficulty speaking or slurred speech:    Temporary loss of vision in one eye:      Problems with dizziness:         Gastrointestinal    Blood in stool:     Vomited blood:         Genitourinary    Burning when urinating:     Blood in urine:        Psychiatric    Major depression:  Hematologic    Bleeding problems:    Problems with blood clotting too easily:        Skin    Rashes or ulcers: x       Constitutional    Fever or chills:      PHYSICAL EXAM: Filed Vitals:   02/02/16 1131  BP: 150/68  Pulse: 75  Height: 5\' 5"  (1.651 m)  Weight: 139 lb (63.05 kg)  SpO2: 96%    GENERAL: The patient is [place] male, in no acute distress sitting in a wheelchair. The vital signs are documented above. CARDIAC: There is a regular rate and rhythm.  VASCULAR: 2+ radial and 2+ femoral pulses bilaterally. Absent popliteal and distal pulses. PULMONARY: There is good air exchange bilaterally without wheezing or rales. ABDOMEN: Soft and non-tender with normal pitched bowel sounds.  MUSCULOSKELETAL: There are no major deformities or cyanosis. NEUROLOGIC: No focal weakness or paresthesias are detected. Patient currently unable to stand due to pain in his left foot and marked diminished sensation SKIN: Dry gangrene of all toes left foot with erythema extending onto the dorsum of the foot PSYCHIATRIC: The patient has a normal affect.  DATA:  He does have a prior lower extremity study at [facility] from April showing calcified vessels inability to obtain ankle arm index. By Doppler he does has an audible flow in his left foot  MEDICAL ISSUES: I had 30-45 minute discussion with patient and multiple family members explaining options. Explained that the next step in evaluation would be arteriography which would put him at extremely high risk for progressing to renal failure. Explained that with a palpable femoral pulse the unlikely that he would have a reconstructable situation in his left foot with this much progressive necrosis as he has. Also he has  had vein harvested from both legs for bypass. Did explain the option of arteriography to determine if options were possible but I felt it is very unlikely that limb salvage would be possible and that arteriogram would put his kidney function at significant risk. Also explain the other option of primary amputation. With his physical exam I do feel that he is marginal being able to heal a below-knee amputation but perhaps could. Also explained very high likelihood of success for healing at above-knee amputation with normal left femoral pulse. Initially, the patient was not willing to make a decision. After protracted discussion with multiple family and the patient they have opted for proceeding with left above-knee amputation. I did explain that this would markedly change his mobility and quality of life. He is asking about timing for prosthesis. I explained that this would be at least 3 months and that he may or may not be a candidate for prosthesis with his other medical issues. They understand and wish to proceed and have chosen Monday, June 19 for surgery   Rosetta Posner, MD [facility] Vascular and Vein Specialists of [facility] Tel [phone] Pager [phone]

## 2016-02-03 ENCOUNTER — Encounter: Payer: Self-pay | Admitting: Internal Medicine

## 2016-02-04 NOTE — Pre-Procedure Instructions (Signed)
Collin Pearson  02/04/2016      [facility] [address], [place] [id number] [place] [id number] Phone: [phone] Fax: [phone]  [facility] DELIVERY - [place], [place] [address] [place] [address] Phone: [phone] Fax: [phone]    Your procedure is scheduled on 02/08/16.  Report to [facility] Admitting at 530 A.M.  Call this number if you have problems the morning of surgery:  [phone]   Remember:  Do not eat food or drink liquids after midnight.  Take these medicines the morning of surgery with A SIP OF WATER --amlodipine,hydralazine,isosorbide,protonix,flomax Do not take any aspirin,anti-inflammatories,vitamins,or herbal supplements 5-7 days prior to surgery.  Do not wear jewelry, make-up or nail polish.  Do not wear lotions, powders, or perfumes.  You may wear deoderant.  Do not shave 48 hours prior to surgery.  Men may shave face and neck.  Do not bring valuables to the hospital.  [facility] is not responsible for any belongings or valuables.  Contacts, dentures or bridgework may not be worn into surgery.  Leave your suitcase in the car.  After surgery it may be brought to your room.  For patients admitted to the hospital, discharge time will be determined by your treatment team.  Patients discharged the day of surgery will not be allowed to drive home.   Name and phone number of your driver:   Special instructions:    Please read over the following fact sheets that you were given.   How to Manage Your Diabetes Before and After Surgery  Why is it important to control my blood sugar before and after surgery? . Improving blood sugar levels before and after surgery helps healing and can limit problems. . A way of improving blood sugar control is eating a healthy diet by: o  Eating less sugar and carbohydrates o  Increasing activity/exercise o  Talking  with your doctor about reaching your blood sugar goals . High blood sugars (greater than 180 mg/dL) can raise your risk of infections and slow your recovery, so you will need to focus on controlling your diabetes during the weeks before surgery. . Make sure that the doctor who takes care of your diabetes knows about your planned surgery including the date and location.  How do I manage my blood sugar before surgery? . Check your blood sugar at least 4 times a day, starting 2 days before surgery, to make sure that the level is not too high or low. o Check your blood sugar the morning of your surgery when you wake up and every 2 hours until you get to the Short Stay unit. . If your blood sugar is less than 70 mg/dL, you will need to treat for low blood sugar: o Do not take insulin. o Treat a low blood sugar (less than 70 mg/dL) with  cup of clear juice (cranberry or apple), 4 glucose tablets, OR glucose gel. o Recheck blood sugar in 15 minutes after treatment (to make sure it is greater than 70 mg/dL). If your blood sugar is not greater than 70 mg/dL on recheck, call [phone] for further instructions. . Report your blood sugar to the short stay nurse when you get to Short Stay.  . If you are admitted to the hospital after surgery: o Your blood sugar will be checked by the staff and you will probably be given insulin after surgery (  instead of oral diabetes medicines) to make sure you have good blood sugar levels. o The goal for blood sugar control after surgery is 80-180 mg/dL.              WHAT DO I DO ABOUT MY DIABETES MEDICATION?   Marland Kitchen Do not take oral diabetes medicines (pills) the morning of surgery.  . THE NIGHT BEFORE SURGERY, take _____10______ units of _____levemir_____insulin  . The day of surgery, do not take other diabetes injectables, including Byetta (exenatide), Bydureon (exenatide ER), Victoza (liraglutide), or Trulicity (dulaglutide).  . If your CBG is greater  than 220 mg/dL, you may take  of your sliding scale (correction) dose of insulin.  Other Instructions:          Patient Signature:  Date:   Nurse Signature:  Date:   Reviewed and Endorsed by [facility] Patient Education Committee, August 2015

## 2016-02-05 ENCOUNTER — Other Ambulatory Visit (HOSPITAL_COMMUNITY): Payer: BLUE CROSS/BLUE SHIELD

## 2016-02-05 ENCOUNTER — Telehealth: Payer: Self-pay | Admitting: Cardiovascular Disease

## 2016-02-05 ENCOUNTER — Encounter (HOSPITAL_COMMUNITY)
Admission: RE | Admit: 2016-02-05 | Discharge: 2016-02-05 | Disposition: A | Discharge status: Home / Self Care | Payer: BLUE CROSS/BLUE SHIELD | Source: Ambulatory Visit | Attending: Vascular Surgery | Admitting: Vascular Surgery

## 2016-02-05 ENCOUNTER — Encounter (HOSPITAL_COMMUNITY): Payer: Self-pay

## 2016-02-05 DIAGNOSIS — I739 Peripheral vascular disease, unspecified: Secondary | ICD-10-CM

## 2016-02-05 DIAGNOSIS — Z01812 Encounter for preprocedural laboratory examination: Secondary | ICD-10-CM | POA: Insufficient documentation

## 2016-02-05 HISTORY — DX: Unspecified osteoarthritis, unspecified site: M19.90

## 2016-02-05 HISTORY — DX: Anxiety disorder, unspecified: F41.9

## 2016-02-05 LAB — COMPREHENSIVE METABOLIC PANEL
ALT: 40 U/L (ref 17–63)
ANION GAP: 13 (ref 5–15)
AST: 60 U/L — ABNORMAL HIGH (ref 15–41)
Albumin: 3 g/dL — ABNORMAL LOW (ref 3.5–5.0)
Alkaline Phosphatase: 102 U/L (ref 38–126)
BUN: 69 mg/dL — ABNORMAL HIGH (ref 6–20)
CHLORIDE: 103 mmol/L (ref 101–111)
CO2: 16 mmol/L — ABNORMAL LOW (ref 22–32)
CREATININE: 4.06 mg/dL — AB (ref 0.61–1.24)
Calcium: 9.4 mg/dL (ref 8.9–10.3)
GFR, EST AFRICAN AMERICAN: 14 mL/min — AB (ref >60)
GFR, EST NON AFRICAN AMERICAN: 12 mL/min — AB (ref >60)
Glucose, Bld: 123 mg/dL — ABNORMAL HIGH (ref 65–99)
POTASSIUM: 5.3 mmol/L — AB (ref 3.5–5.1)
Sodium: 132 mmol/L — ABNORMAL LOW (ref 135–145)
Total Bilirubin: 0.5 mg/dL (ref 0.3–1.2)
Total Protein: 7.2 g/dL (ref 6.5–8.1)

## 2016-02-05 LAB — GLUCOSE, CAPILLARY: Glucose-Capillary: 142 mg/dL — ABNORMAL HIGH (ref 65–99)

## 2016-02-05 LAB — CBC
HCT: 33.7 % — ABNORMAL LOW (ref 39.0–52.0)
Hemoglobin: 10.9 g/dL — ABNORMAL LOW (ref 13.0–17.0)
MCH: 29.5 pg (ref 26.0–34.0)
MCHC: 32.3 g/dL (ref 30.0–36.0)
MCV: 91.1 fL (ref 78.0–100.0)
PLATELETS: 278 10*3/uL (ref 150–400)
RBC: 3.7 MIL/uL — ABNORMAL LOW (ref 4.22–5.81)
RDW: 16.2 % — AB (ref 11.5–15.5)
WBC: 13 10*3/uL — ABNORMAL HIGH (ref 4.0–10.5)

## 2016-02-05 MED ORDER — CHLORHEXIDINE GLUCONATE CLOTH 2 % EX PADS: 6.0000 | MEDICATED_PAD | Freq: Once | CUTANEOUS | Status: DC

## 2016-02-05 NOTE — Progress Notes (Signed)
DR Delma Post notified for anesthesia clearance.

## 2016-02-05 NOTE — Telephone Encounter (Signed)
Clearance routed via EPIC 

## 2016-02-05 NOTE — Anesthesia Preprocedure Evaluation (Addendum)
Anesthesia Evaluation  Patient identified by MRN, date of birth, ID band Patient awake    Reviewed: Allergy & Precautions, NPO status , Patient's Chart, lab work & pertinent test results  Airway Mallampati: II  TM Distance: >3 FB Neck ROM: Full    Dental no notable dental hx. (+) Teeth Intact, Dental Advisory Given   Pulmonary neg pulmonary ROS,    Pulmonary exam normal breath sounds clear to auscultation       Cardiovascular hypertension, Pt. on medications + angina + CAD, + Past MI, + Peripheral Vascular Disease and +CHF  Normal cardiovascular exam+ Valvular Problems/Murmurs  Rhythm:Regular Rate:Normal  Last cardiology office visit of 11-20-15 with Dr. Gwenlyn Found reviewed. Follow-up planned for 6 months.  Aortic valve replacement and RCA bypass in October 2013 by Dr. Servando Snare.   ECHO 02-04-15: Study Conclusions  - Left ventricle: The cavity size was normal. Wall thickness was  increased in a pattern of mild LVH. Systolic function was normal.  The estimated ejection fraction was in the range of 60% to 65%.  Wall motion was normal; there were no regional wall motion  abnormalities. - Aortic valve: Valve area (VTI): 3.54 cm^2. Valve area (Vmax):  3.81 cm^2. Valve area (Vmean): 3.47 cm^2. - Mitral valve: There was mild regurgitation. - Left atrium: The atrium was mildly dilated. - Atrial septum: No defect or patent foramen ovale was identified.   Neuro/Psych Anxiety negative neurological ROS  negative psych ROS   GI/Hepatic Neg liver ROS, GERD  Medicated,  Endo/Other  diabetes, Well Controlled, Type 2, Insulin Dependent, Oral Hypoglycemic Agents  Renal/GU Renal InsufficiencyRenal diseaseCr 2.51  ;  K 5.0  (last labs October 2016)  negative genitourinary   Musculoskeletal  (+) Arthritis ,   Abdominal   Peds negative pediatric ROS (+)  Hematology negative hematology ROS (+) anemia ,   Anesthesia Other Findings    Reproductive/Obstetrics negative OB ROS                      Anesthesia Physical Anesthesia Plan  ASA: III  Anesthesia Plan: Regional   Post-op Pain Management:    Induction:   Airway Management Planned:   Additional Equipment:   Intra-op Plan:   Post-operative Plan:   Informed Consent: I have reviewed the patients History and Physical, chart, labs and discussed the procedure including the risks, benefits and alternatives for the proposed anesthesia with the patient or authorized representative who has indicated his/her understanding and acceptance.   Dental advisory given  Plan Discussed with: CRNA  Anesthesia Plan Comments: (I received a phone call from PAT nurse Juliann Pulse) at 3 PM on 02-05-16; Patient not seen; chart reviewed. Patient will be evaluated when he presents on Monday 02-08-16.)    Anesthesia Quick Evaluation

## 2016-02-05 NOTE — Telephone Encounter (Signed)
Clearance routed to number provided via EPIC. 

## 2016-02-05 NOTE — Telephone Encounter (Signed)
Cleared for his amputation at moderately increased riskvdue only to his age.His LV function is normal.

## 2016-02-05 NOTE — Progress Notes (Signed)
I called Dr Rachel Bo office to inform him of up coming surgery at PAT visit.

## 2016-02-05 NOTE — Telephone Encounter (Signed)
New message      Request for surgical clearance:  1. What type of surgery is being performed? Above the knee amputation  2. When is this surgery scheduled? 02/08/2016  3. Are there any medications that need to be held prior to surgery and how long? For cardiac clearence  4. Name of physician performing surgery? Dr. Donnetta Hutching  5. What is your office phone and fax number? [phone](fax number)/per -admission did not provide a number

## 2016-02-05 NOTE — Telephone Encounter (Signed)
Message routed to Dr. Gwenlyn Found - procedure is this upcoming Monday June 26

## 2016-02-06 LAB — HEMOGLOBIN A1C
Hgb A1c MFr Bld: 8 % — ABNORMAL HIGH (ref 4.8–5.6)
Mean Plasma Glucose: 183 mg/dL

## 2016-02-08 ENCOUNTER — Encounter (HOSPITAL_COMMUNITY)
Admission: AD | Disposition: A | Discharge status: Home / Self Care | Payer: Self-pay | Source: Ambulatory Visit | Attending: Vascular Surgery

## 2016-02-08 ENCOUNTER — Inpatient Hospital Stay (HOSPITAL_COMMUNITY)
Admission: AD | Admit: 2016-02-08 | Discharge: 2016-02-10 | DRG: 240 | Disposition: A | Discharge status: Home / Self Care | Payer: BLUE CROSS/BLUE SHIELD | Source: Ambulatory Visit | Attending: Vascular Surgery | Admitting: Vascular Surgery

## 2016-02-08 ENCOUNTER — Inpatient Hospital Stay (HOSPITAL_COMMUNITY): Payer: BLUE CROSS/BLUE SHIELD | Admitting: Anesthesiology

## 2016-02-08 ENCOUNTER — Encounter (HOSPITAL_COMMUNITY): Payer: Self-pay | Admitting: *Deleted

## 2016-02-08 DIAGNOSIS — Z89512 Acquired absence of left leg below knee: Secondary | ICD-10-CM | POA: Insufficient documentation

## 2016-02-08 DIAGNOSIS — I2510 Atherosclerotic heart disease of native coronary artery without angina pectoris: Secondary | ICD-10-CM | POA: Diagnosis present

## 2016-02-08 DIAGNOSIS — D72829 Elevated white blood cell count, unspecified: Secondary | ICD-10-CM | POA: Diagnosis not present

## 2016-02-08 DIAGNOSIS — K2100 Gastro-esophageal reflux disease with esophagitis, without bleeding: Secondary | ICD-10-CM | POA: Insufficient documentation

## 2016-02-08 DIAGNOSIS — D62 Acute posthemorrhagic anemia: Secondary | ICD-10-CM | POA: Diagnosis not present

## 2016-02-08 DIAGNOSIS — E1165 Type 2 diabetes mellitus with hyperglycemia: Secondary | ICD-10-CM | POA: Diagnosis not present

## 2016-02-08 DIAGNOSIS — I998 Other disorder of circulatory system: Secondary | ICD-10-CM | POA: Diagnosis present

## 2016-02-08 DIAGNOSIS — Z79899 Other long term (current) drug therapy: Secondary | ICD-10-CM | POA: Diagnosis not present

## 2016-02-08 DIAGNOSIS — I739 Peripheral vascular disease, unspecified: Secondary | ICD-10-CM | POA: Diagnosis present

## 2016-02-08 DIAGNOSIS — M62838 Other muscle spasm: Secondary | ICD-10-CM | POA: Diagnosis present

## 2016-02-08 DIAGNOSIS — K0530 Chronic periodontitis, unspecified: Secondary | ICD-10-CM | POA: Diagnosis not present

## 2016-02-08 DIAGNOSIS — I129 Hypertensive chronic kidney disease with stage 1 through stage 4 chronic kidney disease, or unspecified chronic kidney disease: Secondary | ICD-10-CM | POA: Diagnosis present

## 2016-02-08 DIAGNOSIS — E119 Type 2 diabetes mellitus without complications: Secondary | ICD-10-CM | POA: Insufficient documentation

## 2016-02-08 DIAGNOSIS — Z953 Presence of xenogenic heart valve: Secondary | ICD-10-CM

## 2016-02-08 DIAGNOSIS — Z8249 Family history of ischemic heart disease and other diseases of the circulatory system: Secondary | ICD-10-CM | POA: Diagnosis not present

## 2016-02-08 DIAGNOSIS — N179 Acute kidney failure, unspecified: Secondary | ICD-10-CM | POA: Insufficient documentation

## 2016-02-08 DIAGNOSIS — D638 Anemia in other chronic diseases classified elsewhere: Secondary | ICD-10-CM | POA: Insufficient documentation

## 2016-02-08 DIAGNOSIS — K089 Disorder of teeth and supporting structures, unspecified: Secondary | ICD-10-CM | POA: Diagnosis not present

## 2016-02-08 DIAGNOSIS — R54 Age-related physical debility: Secondary | ICD-10-CM | POA: Diagnosis present

## 2016-02-08 DIAGNOSIS — E1122 Type 2 diabetes mellitus with diabetic chronic kidney disease: Secondary | ICD-10-CM | POA: Diagnosis present

## 2016-02-08 DIAGNOSIS — K219 Gastro-esophageal reflux disease without esophagitis: Secondary | ICD-10-CM | POA: Diagnosis present

## 2016-02-08 DIAGNOSIS — I480 Paroxysmal atrial fibrillation: Secondary | ICD-10-CM | POA: Diagnosis present

## 2016-02-08 DIAGNOSIS — Z951 Presence of aortocoronary bypass graft: Secondary | ICD-10-CM

## 2016-02-08 DIAGNOSIS — E1152 Type 2 diabetes mellitus with diabetic peripheral angiopathy with gangrene: Secondary | ICD-10-CM | POA: Diagnosis present

## 2016-02-08 DIAGNOSIS — I25810 Atherosclerosis of coronary artery bypass graft(s) without angina pectoris: Secondary | ICD-10-CM | POA: Diagnosis not present

## 2016-02-08 DIAGNOSIS — Z79891 Long term (current) use of opiate analgesic: Secondary | ICD-10-CM

## 2016-02-08 DIAGNOSIS — I10 Essential (primary) hypertension: Secondary | ICD-10-CM | POA: Diagnosis not present

## 2016-02-08 DIAGNOSIS — N183 Chronic kidney disease, stage 3 (moderate): Secondary | ICD-10-CM | POA: Diagnosis not present

## 2016-02-08 DIAGNOSIS — Z952 Presence of prosthetic heart valve: Secondary | ICD-10-CM | POA: Insufficient documentation

## 2016-02-08 DIAGNOSIS — I96 Gangrene, not elsewhere classified: Secondary | ICD-10-CM | POA: Diagnosis present

## 2016-02-08 DIAGNOSIS — N184 Chronic kidney disease, stage 4 (severe): Secondary | ICD-10-CM | POA: Diagnosis present

## 2016-02-08 DIAGNOSIS — K0889 Other specified disorders of teeth and supporting structures: Secondary | ICD-10-CM | POA: Diagnosis not present

## 2016-02-08 DIAGNOSIS — Z9889 Other specified postprocedural states: Secondary | ICD-10-CM | POA: Insufficient documentation

## 2016-02-08 DIAGNOSIS — Z89612 Acquired absence of left leg above knee: Secondary | ICD-10-CM | POA: Diagnosis not present

## 2016-02-08 DIAGNOSIS — G8918 Other acute postprocedural pain: Secondary | ICD-10-CM | POA: Insufficient documentation

## 2016-02-08 DIAGNOSIS — Z954 Presence of other heart-valve replacement: Secondary | ICD-10-CM | POA: Diagnosis not present

## 2016-02-08 DIAGNOSIS — Z7982 Long term (current) use of aspirin: Secondary | ICD-10-CM

## 2016-02-08 DIAGNOSIS — K036 Deposits [accretions] on teeth: Secondary | ICD-10-CM | POA: Diagnosis not present

## 2016-02-08 DIAGNOSIS — K083 Retained dental root: Secondary | ICD-10-CM | POA: Diagnosis not present

## 2016-02-08 HISTORY — PX: AMPUTATION: SHX166

## 2016-02-08 HISTORY — PX: ABOVE KNEE LEG AMPUTATION: SUR20

## 2016-02-08 LAB — CREATININE, SERUM
CREATININE: 3.14 mg/dL — AB (ref 0.61–1.24)
GFR calc non Af Amer: 16 mL/min — ABNORMAL LOW (ref >60)
GFR, EST AFRICAN AMERICAN: 19 mL/min — AB (ref >60)

## 2016-02-08 LAB — CBC
HCT: 30.8 % — ABNORMAL LOW (ref 39.0–52.0)
Hemoglobin: 9.8 g/dL — ABNORMAL LOW (ref 13.0–17.0)
MCH: 30.1 pg (ref 26.0–34.0)
MCHC: 31.8 g/dL (ref 30.0–36.0)
MCV: 94.5 fL (ref 78.0–100.0)
PLATELETS: 268 10*3/uL (ref 150–400)
RBC: 3.26 MIL/uL — AB (ref 4.22–5.81)
RDW: 15.9 % — ABNORMAL HIGH (ref 11.5–15.5)
WBC: 9.4 10*3/uL (ref 4.0–10.5)

## 2016-02-08 LAB — GLUCOSE, CAPILLARY
GLUCOSE-CAPILLARY: 201 mg/dL — AB (ref 65–99)
GLUCOSE-CAPILLARY: 200 mg/dL — AB (ref 65–99)
Glucose-Capillary: 121 mg/dL — ABNORMAL HIGH (ref 65–99)
Glucose-Capillary: 292 mg/dL — ABNORMAL HIGH (ref 65–99)
Glucose-Capillary: 282 mg/dL — ABNORMAL HIGH (ref 65–99)
Glucose-Capillary: 132 mg/dL — ABNORMAL HIGH (ref 65–99)

## 2016-02-08 SURGERY — AMPUTATION, ABOVE KNEE
Anesthesia: Regional | Site: Leg Upper | Laterality: Left

## 2016-02-08 MED ORDER — PROPOFOL 10 MG/ML IV BOLUS
INTRAVENOUS | Status: AC
  Filled 2016-02-08: qty 20

## 2016-02-08 MED ORDER — ALUM & MAG HYDROXIDE-SIMETH 200-200-20 MG/5ML PO SUSP: 15.0000 mL | ORAL | Status: DC | PRN

## 2016-02-08 MED ORDER — FENTANYL CITRATE (PF) 250 MCG/5ML IJ SOLN
INTRAMUSCULAR | Status: AC
  Filled 2016-02-08: qty 5

## 2016-02-08 MED ORDER — METOPROLOL TARTRATE 5 MG/5ML IV SOLN
2.0000 mg | INTRAVENOUS | Status: DC | PRN
  Filled 2016-02-08: qty 5

## 2016-02-08 MED ORDER — GLIPIZIDE 10 MG PO TABS
10.0000 mg | ORAL_TABLET | Freq: Two times a day (BID) | ORAL | Status: DC
  Administered 2016-02-08 – 2016-02-10 (×5): 10 mg via ORAL
  Filled 2016-02-08 (×5): qty 1

## 2016-02-08 MED ORDER — INSULIN DETEMIR 100 UNIT/ML ~~LOC~~ SOLN
20.0000 [IU] | Freq: Every day | SUBCUTANEOUS | Status: DC
  Administered 2016-02-08 – 2016-02-09 (×2): 20 [IU] via SUBCUTANEOUS
  Filled 2016-02-08 (×3): qty 0.2

## 2016-02-08 MED ORDER — DEXAMETHASONE SODIUM PHOSPHATE 10 MG/ML IJ SOLN
INTRAMUSCULAR | Status: AC
  Filled 2016-02-08: qty 1

## 2016-02-08 MED ORDER — PHENYLEPHRINE HCL 10 MG/ML IJ SOLN
INTRAMUSCULAR | Status: DC | PRN
  Administered 2016-02-08 (×4): 80 ug via INTRAVENOUS

## 2016-02-08 MED ORDER — FENTANYL CITRATE (PF) 100 MCG/2ML IJ SOLN
INTRAMUSCULAR | Status: DC | PRN
  Administered 2016-02-08: 25 ug via INTRAVENOUS
  Administered 2016-02-08: 50 ug via INTRAVENOUS
  Administered 2016-02-08 (×2): 25 ug via INTRAVENOUS

## 2016-02-08 MED ORDER — HYDRALAZINE HCL 50 MG PO TABS: 75.0000 mg | ORAL_TABLET | Freq: Three times a day (TID) | ORAL | Status: DC

## 2016-02-08 MED ORDER — LIDOCAINE-EPINEPHRINE 2 %-1:100000 IJ SOLN
INTRAMUSCULAR | Status: DC | PRN
  Administered 2016-02-08: 20 mL via PERINEURAL

## 2016-02-08 MED ORDER — PANTOPRAZOLE SODIUM 40 MG PO TBEC
40.0000 mg | DELAYED_RELEASE_TABLET | Freq: Every day | ORAL | Status: DC
  Administered 2016-02-09 – 2016-02-10 (×2): 40 mg via ORAL
  Filled 2016-02-08 (×2): qty 1

## 2016-02-08 MED ORDER — ONDANSETRON HCL 4 MG/2ML IJ SOLN: 4.0000 mg | Freq: Four times a day (QID) | INTRAMUSCULAR | Status: DC | PRN

## 2016-02-08 MED ORDER — ATORVASTATIN CALCIUM 40 MG PO TABS
40.0000 mg | ORAL_TABLET | Freq: Every day | ORAL | Status: DC
Start: 2016-02-08 — End: 2016-02-10
  Administered 2016-02-08 – 2016-02-09 (×2): 40 mg via ORAL
  Filled 2016-02-08 (×2): qty 1

## 2016-02-08 MED ORDER — DEXTROSE 5 % IV SOLN
1.5000 g | Freq: Two times a day (BID) | INTRAVENOUS | Status: DC
  Filled 2016-02-08: qty 1.5

## 2016-02-08 MED ORDER — ENOXAPARIN SODIUM 30 MG/0.3ML ~~LOC~~ SOLN
30.0000 mg | SUBCUTANEOUS | Status: DC
  Administered 2016-02-09 – 2016-02-10 (×2): 30 mg via SUBCUTANEOUS
  Filled 2016-02-08 (×2): qty 0.3

## 2016-02-08 MED ORDER — ONDANSETRON HCL 4 MG/2ML IJ SOLN
INTRAMUSCULAR | Status: AC
  Filled 2016-02-08: qty 2

## 2016-02-08 MED ORDER — ACETAMINOPHEN 325 MG PO TABS: 325.0000 mg | ORAL_TABLET | ORAL | Status: DC | PRN

## 2016-02-08 MED ORDER — ISOSORBIDE MONONITRATE ER 60 MG PO TB24
60.0000 mg | ORAL_TABLET | Freq: Every day | ORAL | Status: DC
  Administered 2016-02-09 – 2016-02-10 (×2): 60 mg via ORAL
  Filled 2016-02-08 (×2): qty 1

## 2016-02-08 MED ORDER — FENTANYL CITRATE (PF) 100 MCG/2ML IJ SOLN: 25.0000 ug | INTRAMUSCULAR | Status: DC | PRN

## 2016-02-08 MED ORDER — KETAMINE HCL 100 MG/ML IJ SOLN
INTRAMUSCULAR | Status: AC
  Filled 2016-02-08: qty 1

## 2016-02-08 MED ORDER — PROPOFOL 10 MG/ML IV BOLUS
INTRAVENOUS | Status: DC | PRN
  Administered 2016-02-08: 100 mg via INTRAVENOUS

## 2016-02-08 MED ORDER — DEXTROSE 5 % IV SOLN
1.5000 g | Freq: Two times a day (BID) | INTRAVENOUS | Status: AC
  Administered 2016-02-09: 1.5 g via INTRAVENOUS
  Filled 2016-02-08: qty 1.5

## 2016-02-08 MED ORDER — ADULT MULTIVITAMIN W/MINERALS CH
1.0000 | ORAL_TABLET | Freq: Every day | ORAL | Status: DC
  Administered 2016-02-08 – 2016-02-10 (×3): 1 via ORAL
  Filled 2016-02-08 (×3): qty 1

## 2016-02-08 MED ORDER — LIDOCAINE HCL (CARDIAC) 20 MG/ML IV SOLN
INTRAVENOUS | Status: DC | PRN
  Administered 2016-02-08: 100 mg via INTRAVENOUS

## 2016-02-08 MED ORDER — POTASSIUM CHLORIDE CRYS ER 20 MEQ PO TBCR
20.0000 meq | EXTENDED_RELEASE_TABLET | Freq: Every day | ORAL | Status: DC | PRN
Start: 2016-02-08 — End: 2016-02-10

## 2016-02-08 MED ORDER — ACETAMINOPHEN 325 MG RE SUPP: 325.0000 mg | RECTAL | Status: DC | PRN

## 2016-02-08 MED ORDER — MAGNESIUM SULFATE 2 GM/50ML IV SOLN
2.0000 g | Freq: Every day | INTRAVENOUS | Status: DC | PRN
  Filled 2016-02-08: qty 50

## 2016-02-08 MED ORDER — PHENYLEPHRINE 40 MCG/ML (10ML) SYRINGE FOR IV PUSH (FOR BLOOD PRESSURE SUPPORT)
PREFILLED_SYRINGE | INTRAVENOUS | Status: AC
  Filled 2016-02-08: qty 10

## 2016-02-08 MED ORDER — ASPIRIN EC 81 MG PO TBEC
81.0000 mg | DELAYED_RELEASE_TABLET | Freq: Two times a day (BID) | ORAL | Status: DC
Start: 2016-02-08 — End: 2016-02-10
  Administered 2016-02-08 – 2016-02-10 (×5): 81 mg via ORAL
  Filled 2016-02-08 (×5): qty 1

## 2016-02-08 MED ORDER — LIDOCAINE 2% (20 MG/ML) 5 ML SYRINGE
INTRAMUSCULAR | Status: AC
  Filled 2016-02-08: qty 5

## 2016-02-08 MED ORDER — DOCUSATE SODIUM 100 MG PO CAPS
100.0000 mg | ORAL_CAPSULE | ORAL | Status: DC
  Administered 2016-02-08 – 2016-02-10 (×2): 100 mg via ORAL
  Filled 2016-02-08 (×2): qty 1

## 2016-02-08 MED ORDER — PHENYLEPHRINE HCL 10 MG/ML IJ SOLN
10.0000 mg | INTRAVENOUS | Status: DC | PRN
  Administered 2016-02-08: 30 ug/min via INTRAVENOUS

## 2016-02-08 MED ORDER — HYDRALAZINE HCL 20 MG/ML IJ SOLN: 5.0000 mg | INTRAMUSCULAR | Status: DC | PRN

## 2016-02-08 MED ORDER — INSULIN ASPART 100 UNIT/ML ~~LOC~~ SOLN
0.0000 [IU] | Freq: Three times a day (TID) | SUBCUTANEOUS | Status: DC
  Administered 2016-02-08 – 2016-02-09 (×2): 5 [IU] via SUBCUTANEOUS
  Administered 2016-02-10: 1 [IU] via SUBCUTANEOUS

## 2016-02-08 MED ORDER — SODIUM CHLORIDE 0.9 % IV SOLN
INTRAVENOUS | Status: DC
  Administered 2016-02-08 (×3): via INTRAVENOUS

## 2016-02-08 MED ORDER — BUPIVACAINE HCL (PF) 0.5 % IJ SOLN
INTRAMUSCULAR | Status: DC | PRN
  Administered 2016-02-08: 30 mL via PERINEURAL

## 2016-02-08 MED ORDER — SODIUM CHLORIDE 0.9 % IV SOLN
INTRAVENOUS | Status: DC
  Administered 2016-02-08: 07:00:00 via INTRAVENOUS

## 2016-02-08 MED ORDER — DEXTROSE 5 % IV SOLN
1.5000 g | INTRAVENOUS | Status: AC
  Administered 2016-02-08: 1.5 g via INTRAVENOUS
  Filled 2016-02-08: qty 1.5

## 2016-02-08 MED ORDER — MORPHINE SULFATE (PF) 2 MG/ML IV SOLN
1.0000 mg | INTRAVENOUS | Status: DC | PRN
  Administered 2016-02-09 (×3): 2 mg via INTRAVENOUS
  Filled 2016-02-08 (×3): qty 1

## 2016-02-08 MED ORDER — GUAIFENESIN-DM 100-10 MG/5ML PO SYRP: 15.0000 mL | ORAL_SOLUTION | ORAL | Status: DC | PRN

## 2016-02-08 MED ORDER — AMLODIPINE BESYLATE 10 MG PO TABS
10.0000 mg | ORAL_TABLET | Freq: Every day | ORAL | Status: DC
  Administered 2016-02-09 – 2016-02-10 (×2): 10 mg via ORAL
  Filled 2016-02-08 (×2): qty 1

## 2016-02-08 MED ORDER — FERROUS SULFATE 75 (15 FE) MG/ML PO SOLN
160.0000 mg | Freq: Two times a day (BID) | ORAL | Status: DC
  Administered 2016-02-08 – 2016-02-10 (×4): 157.5 mg via ORAL
  Filled 2016-02-08 (×5): qty 2.1

## 2016-02-08 MED ORDER — OXYCODONE-ACETAMINOPHEN 5-325 MG PO TABS
1.0000 | ORAL_TABLET | ORAL | Status: DC | PRN
  Administered 2016-02-08 (×2): 2 via ORAL
  Administered 2016-02-08: 1 via ORAL
  Administered 2016-02-09 – 2016-02-10 (×3): 2 via ORAL
  Filled 2016-02-08 (×4): qty 2
  Filled 2016-02-08: qty 1
  Filled 2016-02-08: qty 2

## 2016-02-08 MED ORDER — PHENOL 1.4 % MT LIQD
1.0000 | OROMUCOSAL | Status: DC | PRN
Start: 2016-02-08 — End: 2016-02-10

## 2016-02-08 MED ORDER — MIDAZOLAM HCL 2 MG/2ML IJ SOLN
INTRAMUSCULAR | Status: AC
  Filled 2016-02-08: qty 2

## 2016-02-08 MED ORDER — 0.9 % SODIUM CHLORIDE (POUR BTL) OPTIME
TOPICAL | Status: DC | PRN
  Administered 2016-02-08: 1000 mL

## 2016-02-08 MED ORDER — LABETALOL HCL 5 MG/ML IV SOLN
10.0000 mg | INTRAVENOUS | Status: DC | PRN
  Administered 2016-02-09: 10 mg via INTRAVENOUS
  Filled 2016-02-08: qty 4

## 2016-02-08 MED ORDER — HYDRALAZINE HCL 50 MG PO TABS
75.0000 mg | ORAL_TABLET | Freq: Every day | ORAL | Status: DC
  Administered 2016-02-08 – 2016-02-10 (×3): 75 mg via ORAL
  Filled 2016-02-08 (×3): qty 1

## 2016-02-08 MED ORDER — FUROSEMIDE 80 MG PO TABS
80.0000 mg | ORAL_TABLET | Freq: Every day | ORAL | Status: DC
Start: 2016-02-08 — End: 2016-02-10
  Administered 2016-02-08 – 2016-02-10 (×3): 80 mg via ORAL
  Filled 2016-02-08 (×3): qty 1

## 2016-02-08 MED ORDER — ONDANSETRON HCL 4 MG/2ML IJ SOLN
INTRAMUSCULAR | Status: DC | PRN
  Administered 2016-02-08: 4 mg via INTRAVENOUS

## 2016-02-08 MED ORDER — TAMSULOSIN HCL 0.4 MG PO CAPS
0.4000 mg | ORAL_CAPSULE | Freq: Every day | ORAL | Status: DC
  Administered 2016-02-09: 0.4 mg via ORAL
  Filled 2016-02-08: qty 1

## 2016-02-08 SURGICAL SUPPLY — 41 items
BANDAGE ACE 6X5 VEL STRL LF (GAUZE/BANDAGES/DRESSINGS) ×3 IMPLANT
BANDAGE ELASTIC 4 VELCRO ST LF (GAUZE/BANDAGES/DRESSINGS) ×3 IMPLANT
BANDAGE ELASTIC 6 VELCRO ST LF (GAUZE/BANDAGES/DRESSINGS) ×3 IMPLANT
BANDAGE ESMARK 6X9 LF (GAUZE/BANDAGES/DRESSINGS) IMPLANT
BNDG ESMARK 6X9 LF (GAUZE/BANDAGES/DRESSINGS)
BNDG GAUZE ELAST 4 BULKY (GAUZE/BANDAGES/DRESSINGS) ×3 IMPLANT
CANISTER SUCTION 2500CC (MISCELLANEOUS) ×3 IMPLANT
CLIP LIGATING EXTRA MED SLVR (CLIP) ×3 IMPLANT
CLIP LIGATING EXTRA SM BLUE (MISCELLANEOUS) ×3 IMPLANT
COVER SURGICAL LIGHT HANDLE (MISCELLANEOUS) ×6 IMPLANT
CUFF TOURNIQUET SINGLE 34IN LL (TOURNIQUET CUFF) IMPLANT
CUFF TOURNIQUET SINGLE 44IN (TOURNIQUET CUFF) IMPLANT
DRAIN SNY 10X20 3/4 PERF (WOUND CARE) IMPLANT
DRAPE ORTHO SPLIT 77X108 STRL (DRAPES) ×4
DRAPE PROXIMA HALF (DRAPES) ×3 IMPLANT
DRAPE SURG ORHT 6 SPLT 77X108 (DRAPES) ×2 IMPLANT
ELECT REM PT RETURN 9FT ADLT (ELECTROSURGICAL) ×3
ELECTRODE REM PT RTRN 9FT ADLT (ELECTROSURGICAL) ×1 IMPLANT
EVACUATOR SILICONE 100CC (DRAIN) IMPLANT
GAUZE SPONGE 4X4 12PLY STRL (GAUZE/BANDAGES/DRESSINGS) ×6 IMPLANT
GAUZE XEROFORM 5X9 LF (GAUZE/BANDAGES/DRESSINGS) ×3 IMPLANT
GLOVE SS BIOGEL STRL SZ 7.5 (GLOVE) ×1 IMPLANT
GLOVE SUPERSENSE BIOGEL SZ 7.5 (GLOVE) ×2
GOWN STRL REUS W/ TWL LRG LVL3 (GOWN DISPOSABLE) ×3 IMPLANT
GOWN STRL REUS W/TWL LRG LVL3 (GOWN DISPOSABLE) ×6
KIT BASIN OR (CUSTOM PROCEDURE TRAY) ×3 IMPLANT
KIT ROOM TURNOVER OR (KITS) ×3 IMPLANT
NS IRRIG 1000ML POUR BTL (IV SOLUTION) ×3 IMPLANT
PACK GENERAL/GYN (CUSTOM PROCEDURE TRAY) ×3 IMPLANT
PAD ARMBOARD 7.5X6 YLW CONV (MISCELLANEOUS) ×6 IMPLANT
PADDING CAST COTTON 6X4 STRL (CAST SUPPLIES) IMPLANT
SAW GIGLI STERILE 20 (MISCELLANEOUS) ×3 IMPLANT
SPONGE GAUZE 4X4 12PLY STER LF (GAUZE/BANDAGES/DRESSINGS) ×3 IMPLANT
STAPLER VISISTAT 35W (STAPLE) ×3 IMPLANT
STOCKINETTE IMPERVIOUS LG (DRAPES) ×3 IMPLANT
SUT ETHILON 3 0 PS 1 (SUTURE) IMPLANT
SUT VIC AB 0 CT1 18XCR BRD 8 (SUTURE) ×2 IMPLANT
SUT VIC AB 0 CT1 8-18 (SUTURE) ×4
SUT VICRYL AB 2 0 TIES (SUTURE) ×3 IMPLANT
UNDERPAD 30X30 INCONTINENT (UNDERPADS AND DIAPERS) ×3 IMPLANT
WATER STERILE IRR 1000ML POUR (IV SOLUTION) ×3 IMPLANT

## 2016-02-08 NOTE — Interval H&P Note (Signed)
History and Physical Interval Note:  02/08/2016 6:55 AM  Collin Pearson  has presented today for surgery, with the diagnosis of Peripheral vascular disease with left lower extremity ulcer I70.249  The various methods of treatment have been discussed with the patient and family. After consideration of risks, benefits and other options for treatment, the patient has consented to  Procedure(s): AMPUTATION ABOVE KNEE (Left) as a surgical intervention .  The patient's history has been reviewed, patient examined, no change in status, stable for surgery.  I have reviewed the patient's chart and labs.  Questions were answered to the patient's satisfaction.     Curt Jews

## 2016-02-08 NOTE — Op Note (Signed)
    OPERATIVE REPORT  DATE OF SURGERY: 02/08/2016  PATIENT: Collin Pearson, 82 y.o. male MRN: [id number]  DOB: [birth year]  PRE-OPERATIVE DIAGNOSIS: Gangrene left foot  POST-OPERATIVE DIAGNOSIS:  Same  PROCEDURE: Left above-knee amputation  SURGEON:  Curt Jews, M.D.  PHYSICIAN ASSISTANT: [place] PA-C  ANESTHESIA:  Femoral block and general  EBL: 50 ml  Total I/O In: 500 [I.V.:500] Out: 50 [Blood:50]  BLOOD ADMINISTERED: None  DRAINS: None  SPECIMEN: Left above-knee amputation  COUNTS CORRECT:  YES  PLAN OF CARE: PACU   PATIENT DISPOSITION:  PACU - hemodynamically stable  PROCEDURE DETAILS: Patient was taken to the operative placed supine position where the area of the left leg prepped and draped in usual sterile fashion. A fishmouth incision was made just above the knee and carried down through the fascia and muscle to the level of the femur. The muscle was viable at this level. The superficial femoral artery was calcified and chronically occluded. This was ligated with 0 Vicryl ties and divided. The Emerald vein was ligated with 0 Vicryl ties and divided as well. The sciatic nerve was ligated with a Vicryl tie above the level of the amputation site and divided. Periosteum was elevated off the femur and the femur was divided with a Gigli saw. The bone edges were smoothed with a bone rasp. The wound was irrigated with saline. 0 Vicryl figure-of-eight sutures were used to approximate the anterior fascia to the posterior fascia. The wound again was irrigated and the skin was closed with skin clips. Sterile dressing was applied and the patient was transferred to the recovery room in stable condition   Curt Jews, M.D. 02/08/2016 8:52 AM

## 2016-02-08 NOTE — Anesthesia Postprocedure Evaluation (Signed)
Anesthesia Post Note  Patient: Collin Pearson  Procedure(s) Performed: Procedure(s) (LRB): AMPUTATION ABOVE KNEE (Left)  Patient location during evaluation: PACU Anesthesia Type: General Level of consciousness: sedated and patient cooperative Pain management: pain level controlled Vital Signs Assessment: post-procedure vital signs reviewed and stable Respiratory status: spontaneous breathing Cardiovascular status: stable Anesthetic complications: no    Last Vitals:  Filed Vitals:   02/08/16 1130 02/08/16 1200  BP: 142/55 142/57  Pulse: 82 82  Temp:    Resp: 16 16    Last Pain:  Filed Vitals:   02/08/16 1355  PainSc: [place]

## 2016-02-08 NOTE — Anesthesia Procedure Notes (Addendum)
Anesthesia Regional Block:  Femoral nerve block  Pre-Anesthetic Checklist: ,, timeout performed, Correct Patient, Correct Site, Correct Laterality, Correct Procedure, Correct Position, site marked, Risks and benefits discussed, Surgical consent,  Pre-op evaluation,  Post-op pain management  Laterality: Left  Prep: chloraprep       Needles:  Injection technique: Single-shot  Needle Type: Stimulator Needle - 80     Needle Length: 9cm 9 cm Needle Gauge: 22 and 22 G  Needle insertion depth: 6 cm   Additional Needles:  Procedures: ultrasound guided (picture in chart) and nerve stimulator Femoral nerve block  Nerve Stimulator or Paresthesia:  Response: Patellar snap, 0.5 mA,   Additional Responses:   Narrative:  Injection made incrementally with aspirations every 5 mL.  Performed by: Personally  Anesthesiologist: Nolon Nations  Additional Notes: BP cuff, EKG monitors applied. Sedation begun. Femoral artery palpated for location of nerve. After nerve location verified with U/S, anesthetic injected incrementally, slowly, and after negative aspirations under direct u/s guidance. Good perineural spread. Patient tolerated well.   Anesthesia Regional Block:  Popliteal block  Pre-Anesthetic Checklist: ,, timeout performed, Correct Patient, Correct Site, Correct Laterality, Correct Procedure, Correct Position, site marked, Risks and benefits discussed, Surgical consent,  Pre-op evaluation,  Post-op pain management  Laterality: Left  Prep: chloraprep       Needles:  Injection technique: Single-shot  Needle Type: Stimiplex     Needle Length: 10cm 10 cm Needle Gauge: 21 and 21 G    Additional Needles:  Procedures: ultrasound guided (picture in chart) and nerve stimulator  Motor weakness within 5 minutes. Popliteal block  Nerve Stimulator or Paresthesia:  Response: Plantar flexion/toe flexion, 0.5 mA,   Additional Responses:   Narrative:  Injection made  incrementally with aspirations every 5 mL.  Performed by: Personally  Anesthesiologist: Nolon Nations  Additional Notes: Sciatic nerve block. Nerve located and needle positioned with direct ultrasound guidance. Good perineural spread. Patient tolerated well.   Procedure Name: LMA Insertion Date/Time: 02/08/2016 7:44 AM Performed by: Williemae Area B Pre-anesthesia Checklist: Patient identified, Emergency Drugs available, Suction available and Patient being monitored Patient Re-evaluated:Patient Re-evaluated prior to inductionOxygen Delivery Method: Circle System Utilized Preoxygenation: Pre-oxygenation with 100% oxygen Intubation Type: IV induction Ventilation: Mask ventilation without difficulty LMA: LMA inserted LMA Size: 5.0 Number of attempts: 1 Placement Confirmation: positive ETCO2 Tube secured with: Tape Dental Injury: Teeth and Oropharynx as per pre-operative assessment

## 2016-02-08 NOTE — Transfer of Care (Signed)
Immediate Anesthesia Transfer of Care Note  Patient: Collin Pearson  Procedure(s) Performed: Procedure(s): AMPUTATION ABOVE KNEE (Left)  Patient Location: PACU  Anesthesia Type:General  Level of Consciousness: sedated and patient cooperative  Airway & Oxygen Therapy: Patient Spontanous Breathing and Patient connected to face mask oxygen  Post-op Assessment: Report given to RN and Post -op Vital signs reviewed and stable  Post vital signs: Reviewed and stable  Last Vitals:  Filed Vitals:   02/08/16 0602  BP: 146/56  Pulse: 87  Temp: 37.2 C  Resp: 18    Last Pain:  Filed Vitals:   02/08/16 0625  PainSc: 8       Patients Stated Pain Goal: 3 ([id number])  Complications: No apparent anesthesia complications

## 2016-02-08 NOTE — H&P (View-Only) (Signed)
[facility] [place]  Patient name: Collin Pearson MRN: [id number] DOB: 1927/04/07 Sex: male  REASON FOR CONSULT: Gangrenous toes left foot  HPI: Collin Pearson is a 82 y.o. male, who is seen today for urgent evaluation regarding gangrenous changes in his left foot. He is here today with his wife and 2 daughters. He and they report that up until one month ago he was ambulatory. He is very frail and has a multitude of medical problems including critical coronary disease and stage IV kidney disease. The last month he has had a rapidly progressive gangrenous changes of his left foot. He was seen in the [facility] center and was referred to [name]. Does have a history of prior aortic valve replacement with a tissue valve and is not on anticoagulation. He now has developed gangrene of all the toes of his left foot with severe erythema and also breakdown of the pretibial tissue above his ankle on the left. Right leg does have significant swelling but no skin breakdown.  Past history is significant for remote history of bilateral carotid endarterectomy at [facility]. Also subsequently had recurrences in bilateral carotid stenting in [facility].  Past Medical History  Diagnosis Date  . Clotting disorder ([place])   . Coronary artery disease   . AS (aortic stenosis)     status post aortic valve replacement with an [employer] model 73 T. fracture bioprosthesis  . Anginal pain ([place])   . Hypertension   . Diabetes mellitus   . Peripheral vascular disease ([place])   . GERD (gastroesophageal reflux disease)   . CKD (chronic kidney disease) stage 4, GFR 15-29 ml/min (HCC)   . PVD (peripheral vascular disease), with hsx of bilateral carotid endarterectomies at [facility], and bilateral carotid stenting 05/18/2012  . Heart murmur   . PAF (paroxysmal atrial fibrillation), s/p cabg 06/18/2012  . Critical lower limb ischemia      Family History  Problem Relation Age of Onset  . Heart disease Mother 75  . Heart disease Father 39  . CAD Brother   . Heart disease Maternal Uncle   . Heart disease Paternal Uncle   . Heart disease Maternal Grandfather 68    SOCIAL HISTORY: Social History   Social History  . Marital Status: Married    Spouse Name: N/A  . Number of Children: N/A  . Years of Education: N/A   Occupational History  . Not on file.   Social History Main Topics  . Smoking status: Never Smoker   . Smokeless tobacco: Never Used  . Alcohol Use: No  . Drug Use: No  . Sexual Activity: Not Currently   Other Topics Concern  . Not on file   Social History Narrative    No Known Allergies  Current Outpatient Prescriptions  Medication Sig Dispense Refill  . amLODipine (NORVASC) 10 MG tablet TAKE 1 TABLET DAILY 90 tablet 2  . aspirin EC 81 MG tablet Take 81 mg by mouth 2 (two) times daily.    Marland Kitchen atorvastatin (LIPITOR) 40 MG tablet TAKE 1 TABLET DAILY AT BEDTIME 90 tablet 1  . fish oil-omega-3 fatty acids 1000 MG capsule Take 1 g by mouth 3 (three) times daily. Reported on 02/02/2016    . furosemide (LASIX) 80 MG tablet Take 1 tablet (80 mg total) by mouth daily. Take additional 40mg  every other day in the evening if weight goes above 134lbs 135 tablet 3  . hydrALAZINE (APRESOLINE) 25 MG tablet  Take 3 tablets (75 mg total) by mouth 3 (three) times daily. 60 tablet 3  . Iron TABS Take 160 mg by mouth 2 (two) times daily.     . isosorbide mononitrate (IMDUR) 60 MG 24 hr tablet Take 1 tablet (60 mg total) by mouth daily. 30 tablet 1  . LEVEMIR 100 UNIT/ML injection Inject 20 Units into the skin at bedtime.    . Multiple Vitamin (MULTIVITAMIN WITH MINERALS) TABS Take 1 tablet by mouth daily.    Marland Kitchen oxyCODONE-acetaminophen (PERCOCET) 10-325 MG tablet Take 1 tablet by mouth every 4 (four) hours as needed for pain.    . pantoprazole (PROTONIX) 40 MG tablet Take 40 mg by mouth daily.    Marland Kitchen  sulfamethoxazole-trimethoprim (BACTRIM DS) 800-160 MG tablet Take 1 tablet by mouth 2 (two) times daily.    . Tamsulosin HCl (FLOMAX) 0.4 MG CAPS Take 0.4 mg by mouth. One tablet in the morning and 1/2 tablet in the evening    . vitamin E (VITAMIN E) 400 UNIT capsule Take 400 Units by mouth daily. Reported on 02/02/2016    . BD INSULIN SYRINGE ULTRAFINE 31G X 5/16" 1 ML MISC Reported on 02/02/2016    . clotrimazole-betamethasone (LOTRISONE) cream Apply 1 application topically daily as needed (dry skin, itching). Reported on 02/02/2016    . docusate sodium (STOOL SOFTENER) 100 MG capsule Take 100 mg by mouth 3 (three) times a week. Reported on 02/02/2016    . Epoetin Alfa (PROCRIT IJ) Inject 1 Dose as directed as needed. Reported on 02/02/2016    . glipiZIDE (GLUCOTROL) 10 MG tablet Take 10 mg by mouth 2 (two) times daily. Reported on 02/02/2016    . ONE TOUCH ULTRA TEST test strip Reported on 02/02/2016    . silver sulfADIAZINE (SILVADENE) 1 % cream Apply 1 application topically as needed (dry skin). Reported on 02/02/2016    . Vitamin D, Ergocalciferol, (DRISDOL) 50000 units CAPS capsule Take 50,000 Units by mouth every 7 (seven) days. Reported on 02/02/2016     No current facility-administered medications for this visit.    REVIEW OF SYSTEMS:  [X]  denotes positive finding, [ ]  denotes negative finding Cardiac  Comments:  Chest pain or chest pressure:    Shortness of breath upon exertion:    Short of breath when lying flat:    Irregular heart rhythm:        Vascular    Pain in calf, thigh, or hip brought on by ambulation: x   Pain in feet at night that wakes you up from your sleep:     Blood clot in your veins:    Leg swelling:  x       Pulmonary    Oxygen at home:    Productive cough:     Wheezing:         Neurologic    Sudden weakness in arms or legs:     Sudden numbness in arms or legs:     Sudden onset of difficulty speaking or slurred speech:    Temporary loss of vision in one eye:      Problems with dizziness:         Gastrointestinal    Blood in stool:     Vomited blood:         Genitourinary    Burning when urinating:     Blood in urine:        Psychiatric    Major depression:  Hematologic    Bleeding problems:    Problems with blood clotting too easily:        Skin    Rashes or ulcers: x       Constitutional    Fever or chills:      PHYSICAL EXAM: Filed Vitals:   02/02/16 1131  BP: 150/68  Pulse: 75  Height: 5\' 5"  (1.651 m)  Weight: 139 lb (63.05 kg)  SpO2: 96%    GENERAL: The patient is [place] male, in no acute distress sitting in a wheelchair. The vital signs are documented above. CARDIAC: There is a regular rate and rhythm.  VASCULAR: 2+ radial and 2+ femoral pulses bilaterally. Absent popliteal and distal pulses. PULMONARY: There is good air exchange bilaterally without wheezing or rales. ABDOMEN: Soft and non-tender with normal pitched bowel sounds.  MUSCULOSKELETAL: There are no major deformities or cyanosis. NEUROLOGIC: No focal weakness or paresthesias are detected. Patient currently unable to stand due to pain in his left foot and marked diminished sensation SKIN: Dry gangrene of all toes left foot with erythema extending onto the dorsum of the foot PSYCHIATRIC: The patient has a normal affect.  DATA:  He does have a prior lower extremity study at [facility] from April showing calcified vessels inability to obtain ankle arm index. By Doppler he does has an audible flow in his left foot  MEDICAL ISSUES: I had 30-45 minute discussion with patient and multiple family members explaining options. Explained that the next step in evaluation would be arteriography which would put him at extremely high risk for progressing to renal failure. Explained that with a palpable femoral pulse the unlikely that he would have a reconstructable situation in his left foot with this much progressive necrosis as he has. Also he has  had vein harvested from both legs for bypass. Did explain the option of arteriography to determine if options were possible but I felt it is very unlikely that limb salvage would be possible and that arteriogram would put his kidney function at significant risk. Also explain the other option of primary amputation. With his physical exam I do feel that he is marginal being able to heal a below-knee amputation but perhaps could. Also explained very high likelihood of success for healing at above-knee amputation with normal left femoral pulse. Initially, the patient was not willing to make a decision. After protracted discussion with multiple family and the patient they have opted for proceeding with left above-knee amputation. I did explain that this would markedly change his mobility and quality of life. He is asking about timing for prosthesis. I explained that this would be at least 3 months and that he may or may not be a candidate for prosthesis with his other medical issues. They understand and wish to proceed and have chosen Monday, June 19 for surgery   Rosetta Posner, MD [facility] Tel [phone] Pager [phone]

## 2016-02-09 ENCOUNTER — Encounter (HOSPITAL_COMMUNITY): Payer: Self-pay | Admitting: Vascular Surgery

## 2016-02-09 DIAGNOSIS — Z9889 Other specified postprocedural states: Secondary | ICD-10-CM | POA: Insufficient documentation

## 2016-02-09 DIAGNOSIS — D72829 Elevated white blood cell count, unspecified: Secondary | ICD-10-CM | POA: Insufficient documentation

## 2016-02-09 DIAGNOSIS — I10 Essential (primary) hypertension: Secondary | ICD-10-CM

## 2016-02-09 DIAGNOSIS — D62 Acute posthemorrhagic anemia: Secondary | ICD-10-CM | POA: Insufficient documentation

## 2016-02-09 DIAGNOSIS — Z89512 Acquired absence of left leg below knee: Secondary | ICD-10-CM | POA: Insufficient documentation

## 2016-02-09 DIAGNOSIS — Z952 Presence of prosthetic heart valve: Secondary | ICD-10-CM | POA: Insufficient documentation

## 2016-02-09 DIAGNOSIS — E119 Type 2 diabetes mellitus without complications: Secondary | ICD-10-CM

## 2016-02-09 DIAGNOSIS — G8918 Other acute postprocedural pain: Secondary | ICD-10-CM

## 2016-02-09 DIAGNOSIS — Z954 Presence of other heart-valve replacement: Secondary | ICD-10-CM

## 2016-02-09 DIAGNOSIS — N184 Chronic kidney disease, stage 4 (severe): Secondary | ICD-10-CM | POA: Insufficient documentation

## 2016-02-09 DIAGNOSIS — Z89519 Acquired absence of unspecified leg below knee: Secondary | ICD-10-CM

## 2016-02-09 DIAGNOSIS — I25810 Atherosclerosis of coronary artery bypass graft(s) without angina pectoris: Secondary | ICD-10-CM

## 2016-02-09 DIAGNOSIS — I739 Peripheral vascular disease, unspecified: Secondary | ICD-10-CM

## 2016-02-09 LAB — BASIC METABOLIC PANEL
ANION GAP: 9 (ref 5–15)
BUN: 48 mg/dL — ABNORMAL HIGH (ref 6–20)
CALCIUM: 8.9 mg/dL (ref 8.9–10.3)
CHLORIDE: 110 mmol/L (ref 101–111)
CO2: 21 mmol/L — AB (ref 22–32)
CREATININE: 2.34 mg/dL — AB (ref 0.61–1.24)
GFR calc non Af Amer: 23 mL/min — ABNORMAL LOW (ref >60)
GFR, EST AFRICAN AMERICAN: 27 mL/min — AB (ref >60)
Glucose, Bld: 83 mg/dL (ref 65–99)
Potassium: 4.3 mmol/L (ref 3.5–5.1)
SODIUM: 140 mmol/L (ref 135–145)

## 2016-02-09 LAB — GLUCOSE, CAPILLARY
GLUCOSE-CAPILLARY: 84 mg/dL (ref 65–99)
GLUCOSE-CAPILLARY: 289 mg/dL — AB (ref 65–99)
GLUCOSE-CAPILLARY: 283 mg/dL — AB (ref 65–99)
Glucose-Capillary: 56 mg/dL — ABNORMAL LOW (ref 65–99)
Glucose-Capillary: 117 mg/dL — ABNORMAL HIGH (ref 65–99)

## 2016-02-09 LAB — CBC
HEMATOCRIT: 32.8 % — AB (ref 39.0–52.0)
HEMOGLOBIN: 10.1 g/dL — AB (ref 13.0–17.0)
MCH: 28.8 pg (ref 26.0–34.0)
MCHC: 30.8 g/dL (ref 30.0–36.0)
MCV: 93.4 fL (ref 78.0–100.0)
Platelets: 273 10*3/uL (ref 150–400)
RBC: 3.51 MIL/uL — ABNORMAL LOW (ref 4.22–5.81)
RDW: 16.1 % — ABNORMAL HIGH (ref 11.5–15.5)
WBC: 13.6 10*3/uL — AB (ref 4.0–10.5)

## 2016-02-09 MED ORDER — METHOCARBAMOL 1000 MG/10ML IJ SOLN
500.0000 mg | Freq: Three times a day (TID) | INTRAVENOUS | Status: DC
  Administered 2016-02-09 – 2016-02-10 (×5): 500 mg via INTRAVENOUS
  Filled 2016-02-09 (×8): qty 5

## 2016-02-09 NOTE — Progress Notes (Signed)
Occupational Therapy Evaluation Patient Details Name: Collin Pearson MRN: [id number] DOB: 1926-10-27 Today's Date: 02/09/2016    History of Present Illness 82 y.o. male admitted with gangrenous L foot and underwent L AKA on 02/08/16. PMH significant for CABG, Pacer, AVR, Clotting disorder, DM, HTN, PVD, CKD   Clinical Impression   For past 1-2 weeks, pt has required assistance for all ADLs and has not been ambulatory, but prior pt was independent with all ADLs and used SPC. Pt currently requires mod +2 assist with use of Stedy for transfers and ADLs. Pt will benefit from continued acute OT to increase independence and safety with ADLs and mobility. Recommend CIR as pt is motivated and wants to regain independence to return home. Will continue to follow acutely.    Follow Up Recommendations  CIR;Supervision/Assistance - 24 hour    Equipment Recommendations  Other (comment) (TBD at next venue)    Recommendations for Other Services       Precautions / Restrictions Precautions Precautions: Fall Restrictions Weight Bearing Restrictions: No      Mobility Bed Mobility Overal bed mobility: Needs Assistance;+2 for physical assistance Bed Mobility: Rolling;Sidelying to Sit Rolling: Mod assist Sidelying to sit: Mod assist;+2 for physical assistance;HOB elevated       General bed mobility comments: Mod +2 assist for trunk support to come to sitting and to pivot and scoot hips to EOB. VCs for hand placement.  Transfers Overall transfer level: Needs assistance Equipment used: Ambulation equipment used Transfers: Sit to/from [facility] Sit to Stand: Mod assist;+2 physical assistance Stand pivot transfers: Mod assist;+2 physical assistance       General transfer comment: Mod +2 assist for boost to stand and to maintain balance. VCs for hand placement and safety. Pt with R lateral lean while using stedy and required min assist to maintain midline position.     Balance Overall balance assessment: Needs assistance Sitting-balance support: No upper extremity supported;Feet supported Sitting balance-Leahy Scale: Poor Sitting balance - Comments: Needs UE support or assist to maintain static sitting  Postural control: Posterior lean Standing balance support: Bilateral upper extremity supported;During functional activity Standing balance-Leahy Scale: Poor Standing balance comment: UE support on Steady and mod assist for standing balance.                             ADL Overall ADL's : Needs assistance/impaired Eating/Feeding: Set up;Sitting   Grooming: Wash/dry hands;Wash/dry face;Set up;Sitting   Upper Body Bathing: Set up;Sitting   Lower Body Bathing: Moderate assistance;+2 for physical assistance;Sit to/from stand   Upper Body Dressing : Set up;Sitting   Lower Body Dressing: Moderate assistance;+2 for physical assistance;Sit to/from stand   Toilet Transfer: Moderate assistance;+2 for physical assistance;Cueing for safety;BSC (stedy)   Toileting- Clothing Manipulation and Hygiene: Moderate assistance;+2 for physical assistance;Sit to/from stand       Functional mobility during ADLs: Moderate assistance;+2 for physical assistance (stedy)       Vision Vision Assessment?: No apparent visual deficits   Perception     Praxis      Pertinent Vitals/Pain Pain Assessment: Faces Faces Pain Scale: Hurts little more Pain Location: LLE phantom pain Pain Intervention(s): Limited activity within patient's tolerance;Monitored during session;Premedicated before session;Repositioned     Hand Dominance Right   Extremity/Trunk Assessment Upper Extremity Assessment Upper Extremity Assessment: Overall WFL for tasks assessed (overall 4/5 strength)   Lower Extremity Assessment Lower Extremity Assessment: RLE deficits/detail;LLE deficits/detail RLE Deficits / Details:  grossly 3+/5 LLE Deficits / Details: AKA   Cervical / Trunk  Assessment Cervical / Trunk Assessment: Kyphotic   Communication Communication Communication: HOH   Cognition Arousal/Alertness: Awake/alert Behavior During Therapy: WFL for tasks assessed/performed Overall Cognitive Status: Within Functional Limits for tasks assessed                     General Comments       Exercises       Shoulder Instructions      Home Living Family/patient expects to be discharged to:: Private residence Living Arrangements: Spouse/significant other Available Help at Discharge: Family;Available 24 hours/day (wife works during the day, daughter may be able to stay) Type of Home: House Home Access: Stairs to enter [employer] of Steps: 3 Entrance Stairs-Rails: [place]: One level     Bathroom Shower/Tub: Teacher, early years/pre: [place]: Environmental consultant - 2 wheels;Walker - 4 wheels;Cane - single point;Bedside commode;Tub bench;Wheelchair - manual      Lives With: Spouse    Prior Functioning/Environment Level of Independence: Needs assistance  Gait / Transfers Assistance Needed: Assist with transfers and very little amb in last 2 weeks. Sitting on rollator and family pushing him around. Until 2 weeks ago was amb with occasional use of cane. ADL's / Homemaking Assistance Needed: Assist for all ADLs in past 1-2 weeks, but prior to that was independent        OT Diagnosis: Generalized weakness;Acute pain   OT Problem List: Decreased strength;Decreased activity tolerance;Impaired balance (sitting and/or standing);Decreased safety awareness;Decreased coordination;Decreased knowledge of use of DME or AE;Decreased knowledge of precautions;Pain;Impaired sensation   OT Treatment/Interventions: Self-care/ADL training;Therapeutic exercise;Energy conservation;DME and/or AE instruction;Therapeutic activities;Patient/family education;Balance training    OT Goals(Current goals can be found in the care plan  section) Acute Rehab OT Goals Patient Stated Goal: to be independent again and go home OT Goal Formulation: With patient Time For Goal Achievement: 02/23/16 Potential to Achieve Goals: Good ADL Goals Pt Will Perform Lower Body Bathing: with min assist;sit to/from stand Pt Will Perform Lower Body Dressing: with min assist;sit to/from stand Pt Will Transfer to Toilet: with min assist;stand pivot transfer;bedside commode Pt Will Perform Toileting - Clothing Manipulation and hygiene: with min assist;sit to/from stand Pt/caregiver will Perform Home Exercise Program: Increased strength;Both right and left upper extremity;Independently;With written HEP provided  OT Frequency: Min 2X/week   Barriers to D/C: Decreased caregiver support  Wife works during the day       Co-evaluation PT/OT/SLP Co-Evaluation/Treatment: Yes Reason for Co-Treatment: For patient/therapist safety PT goals addressed during session: Mobility/safety with mobility OT goals addressed during session: ADL's and self-care;Proper use of Adaptive equipment and DME      End of Session Equipment Utilized During Treatment: Gait belt;Other (comment) Charlaine Dalton) Nurse Communication: Mobility status;Need for lift equipment  Activity Tolerance: Patient tolerated treatment well Patient left: in chair;with call bell/phone within reach;with chair alarm set;with family/visitor present   Time: 1415-1456 OT Time Calculation (min): 41 min Charges:  OT General Charges $OT Visit: 1 Procedure OT Evaluation $OT Eval Moderate Complexity: 1 Procedure OT Treatments $Self Care/Home Management : 8-22 mins G-Codes:    Redmond Baseman, OTR/L Pager: [phone] 02/09/2016, 3:27 PM

## 2016-02-09 NOTE — Consult Note (Addendum)
Physical Medicine and Rehabilitation Consult Reason for Consult: Left BKA Referring Physician: Dr. Donnetta Hutching   HPI: Collin Pearson is a 82 y.o. right handed male with history of aortic stenosis status post aortic valve replacement/CABG maintained on aspirin 81 mg daily, hypertension, diabetes mellitus, bilateral carotid endarterectomy at [facility], chronic renal insufficiency with creatinine 3.14-4.06 and peripheral vascular disease. Per chart review patient lives with wife. Independent with a walker prior to admission. Wife works during the day. He has a daughter who can assist on discharge. Two-level home with bedroom downstairs and 4 steps to entry. Presented 02/08/2016 with gangrenous toes to the left foot and followed at the [facility] wound center. Developed severe erythema of his toes and breakdown of the pretibial tissue above his ankle on the left. Recent arteriograms in April showed calcified vessels inability to obtain ankle arm index. 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 Donnetta Hutching. Hospital course pain management. Acute blood loss anemia 10.1 and monitored. Subcutaneous Lovenox for DVT prophylaxis. Physical and occupational therapy evaluations pending. M.D. has requested physical medicine rehabilitation consult   Review of Systems  Constitutional: Negative for fever and chills.  HENT: Negative for hearing loss.   Eyes: Negative for blurred vision and double vision.  Respiratory: Positive for shortness of breath. Negative for cough.   Cardiovascular: Negative for chest pain.  Gastrointestinal: Positive for constipation. Negative for nausea and vomiting.       GERD  Genitourinary: Positive for urgency. Negative for dysuria and hematuria.  Musculoskeletal: Positive for joint pain.  Skin: Negative for rash.  Neurological: Positive for weakness. Negative for seizures and headaches.  Psychiatric/Behavioral:       Anxiety  All  other systems reviewed and are negative.  Past Medical History  Diagnosis Date  . Clotting disorder ([place])   . Coronary artery disease   . AS (aortic stenosis)     status post aortic valve replacement with an [employer] model 75 T. fracture bioprosthesis  . Anginal pain ([place])   . Hypertension   . Diabetes mellitus   . Peripheral vascular disease ([place])   . GERD (gastroesophageal reflux disease)   . CKD (chronic kidney disease) stage 4, GFR 15-29 ml/min (HCC)   . PVD (peripheral vascular disease), with hsx of bilateral carotid endarterectomies at [facility], and bilateral carotid stenting 05/18/2012  . Heart murmur   . PAF (paroxysmal atrial fibrillation), s/p cabg 06/18/2012  . Critical lower limb ischemia   . Arthritis   . Anxiety    Past Surgical History  Procedure Laterality Date  . Endarterectomy    . Prostate surgery    . Cardiac stents    . Cardiac catheterization    . Coronary artery bypass graft  06/13/2012    Procedure: CORONARY ARTERY BYPASS GRAFTING (CABG);  Surgeon: Grace Isaac, MD;  Location: [place];  Service: Open Heart Surgery;  Laterality: N/A;  must start at 89 - appointment at 0800  . Aortic valve replacement  06/13/2012    Procedure: AORTIC VALVE REPLACEMENT (AVR);  Surgeon: Grace Isaac, MD;  Location: [place];  Service: Open Heart Surgery;  Laterality: N/A;  . Cardiac stress test  06/03/2010    Mild anterolateral ischemis  . Left and right heart catheterization with coronary angiogram N/A 05/18/2012    Procedure: LEFT AND RIGHT HEART CATHETERIZATION WITH CORONARY ANGIOGRAM;  Surgeon: Lorretta Harp, MD;  Location: [facility] CATH LAB;  Service: Cardiovascular;  Laterality: N/A;  . Carotid angiogram N/A 05/18/2012    Procedure: CAROTID ANGIOGRAM;  Surgeon: Lorretta Harp, MD;  Location: [facility] CATH LAB;  Service: Cardiovascular;  Laterality: N/A;  . Cardiac valve replacement    . Above knee leg amputation Left 02/08/2016   Family History  Problem  Relation Age of Onset  . Heart disease Mother 60  . Heart disease Father 51  . CAD Brother   . Heart disease Maternal Uncle   . Heart disease Paternal Uncle   . Heart disease Maternal Grandfather 68   Social History:  reports that he has never smoked. He has never used smokeless tobacco. He reports that he does not drink alcohol or use illicit drugs. Allergies: No Known Allergies Medications Prior to Admission  Medication Sig Dispense Refill  . amLODipine (NORVASC) 10 MG tablet TAKE 1 TABLET DAILY 90 tablet 2  . aspirin EC 81 MG tablet Take 81 mg by mouth 2 (two) times daily.    Marland Kitchen atorvastatin (LIPITOR) 40 MG tablet TAKE 1 TABLET DAILY AT BEDTIME 90 tablet 1  . BD INSULIN SYRINGE ULTRAFINE 31G X 5/16" 1 ML MISC Reported on 02/02/2016    . clotrimazole-betamethasone (LOTRISONE) cream Apply 1 application topically daily as needed (dry skin, itching). Reported on 02/02/2016    . docusate sodium (STOOL SOFTENER) 100 MG capsule Take 100 mg by mouth 3 (three) times a week. Reported on 02/02/2016    . Epoetin Alfa (PROCRIT IJ) Inject 1 Dose as directed as needed. Reported on 02/02/2016    . fish oil-omega-3 fatty acids 1000 MG capsule Take 1 g by mouth 3 (three) times daily. Reported on 02/02/2016    . furosemide (LASIX) 80 MG tablet Take 1 tablet (80 mg total) by mouth daily. Take additional 40mg  every other day in the evening if weight goes above 134lbs 135 tablet 3  . glipiZIDE (GLUCOTROL) 10 MG tablet Take 10 mg by mouth 2 (two) times daily. Reported on 02/02/2016    . hydrALAZINE (APRESOLINE) 25 MG tablet Take 3 tablets (75 mg total) by mouth 3 (three) times daily. (Patient taking differently: Take 75 mg by mouth daily. ) 60 tablet 3  . Iron TABS Take 160 mg by mouth 2 (two) times daily.     . isosorbide mononitrate (IMDUR) 60 MG 24 hr tablet Take 1 tablet (60 mg total) by mouth daily. 30 tablet 1  . LEVEMIR 100 UNIT/ML injection Inject 20 Units into the skin at bedtime.    . Multiple Vitamin  (MULTIVITAMIN WITH MINERALS) TABS Take 1 tablet by mouth daily.    . ONE TOUCH ULTRA TEST test strip Reported on 02/02/2016    . oxyCODONE-acetaminophen (PERCOCET) 10-325 MG tablet Take 1 tablet by mouth every 4 (four) hours as needed for pain.    . pantoprazole (PROTONIX) 40 MG tablet Take 40 mg by mouth daily.    . silver sulfADIAZINE (SILVADENE) 1 % cream Apply 1 application topically as needed (dry skin). Reported on 02/02/2016    . sulfamethoxazole-trimethoprim (BACTRIM DS) 800-160 MG tablet Take 1 tablet by mouth 2 (two) times daily.    . Tamsulosin HCl (FLOMAX) 0.4 MG CAPS Take 0.4 mg by mouth. One tablet in the morning and 1/2 tablet in the evening    . Vitamin D, Ergocalciferol, (DRISDOL) 50000 units CAPS capsule Take 50,000 Units by mouth every 7 (seven) days. Reported on 02/02/2016    . vitamin E (VITAMIN E) 400 UNIT capsule Take 400 Units by mouth daily. Reported  on 02/02/2016      Home: Home Living Family/patient expects to be discharged to:: Unsure Living Arrangements: Spouse/significant other  Functional History:   Functional Status:  Mobility:          ADL:    Cognition: Cognition Orientation Level: Oriented X4    Blood pressure 142/48, pulse 74, temperature 97.9 F (36.6 C), temperature source Oral, resp. rate 20, weight 63.05 kg (139 lb), SpO2 95 %. Physical Exam  Vitals reviewed. Constitutional: He is oriented to person, place, and time. He appears well-developed and well-nourished.  HENT:  Head: Normocephalic and atraumatic.  Eyes: Conjunctivae and EOM are normal.  Neck: Normal range of motion. Neck supple. No thyromegaly present.  Cardiovascular: Normal rate and regular rhythm.   Respiratory: Effort normal and breath sounds normal. No respiratory distress.  GI: Soft. Bowel sounds are normal. He exhibits no distension.  Musculoskeletal: He exhibits tenderness. He exhibits no edema.  Left BKA  Neurological: He is alert and oriented to person, place, and  time.  B/l UE, RLE 5/5 proximal to distal LLE: Hip flexion 5/5  Skin: Skin is warm and dry.  Surgical amputation site is dressed appropriately tender. Some ischemic changes to right lower extremity  Psychiatric: He has a normal mood and affect. His behavior is normal.    Results for orders placed or performed during the hospital encounter of 02/08/16 (from the past 24 hour(s))  Glucose, capillary     Status: Abnormal   Collection Time: 02/08/16  6:09 AM  Result Value Ref Range   Glucose-Capillary 121 (H) 65 - 99 mg/dL   Comment 1 Notify RN    Comment 2 Document in Chart   Glucose, capillary     Status: Abnormal   Collection Time: 02/08/16  8:54 AM  Result Value Ref Range   Glucose-Capillary 201 (H) 65 - 99 mg/dL   Comment 1 Notify RN    Comment 2 Document in Chart   Glucose, capillary     Status: Abnormal   Collection Time: 02/08/16 10:37 AM  Result Value Ref Range   Glucose-Capillary 292 (H) 65 - 99 mg/dL   Comment 1 Notify RN   Glucose, capillary     Status: Abnormal   Collection Time: 02/08/16 11:08 AM  Result Value Ref Range   Glucose-Capillary 200 (H) 65 - 99 mg/dL  CBC     Status: Abnormal   Collection Time: 02/08/16 12:10 PM  Result Value Ref Range   WBC 9.4 4.0 - 10.5 K/uL   RBC 3.26 (L) 4.22 - 5.81 MIL/uL   Hemoglobin 9.8 (L) 13.0 - 17.0 g/dL   HCT 30.8 (L) 39.0 - 52.0 %   MCV 94.5 78.0 - 100.0 fL   MCH 30.1 26.0 - 34.0 pg   MCHC 31.8 30.0 - 36.0 g/dL   RDW 15.9 (H) 11.5 - 15.5 %   Platelets 268 150 - 400 K/uL  Creatinine, serum     Status: Abnormal   Collection Time: 02/08/16 12:10 PM  Result Value Ref Range   Creatinine, Ser 3.14 (H) 0.61 - 1.24 mg/dL   GFR calc non Af Amer 16 (L) >60 mL/min   GFR calc Af Amer 19 (L) >60 mL/min  Glucose, capillary     Status: Abnormal   Collection Time: 02/08/16  4:17 PM  Result Value Ref Range   Glucose-Capillary 282 (H) 65 - 99 mg/dL  Glucose, capillary     Status: Abnormal   Collection Time: 02/08/16  9:12 PM  Result Value Ref Range   Glucose-Capillary 132 (H) 65 - 99 mg/dL  Basic metabolic panel     Status: Abnormal   Collection Time: 02/09/16  3:35 AM  Result Value Ref Range   Sodium 140 135 - 145 mmol/L   Potassium 4.3 3.5 - 5.1 mmol/L   Chloride 110 101 - 111 mmol/L   CO2 21 (L) 22 - 32 mmol/L   Glucose, Bld 83 65 - 99 mg/dL   BUN 48 (H) 6 - 20 mg/dL   Creatinine, Ser 2.34 (H) 0.61 - 1.24 mg/dL   Calcium 8.9 8.9 - 10.3 mg/dL   GFR calc non Af Amer 23 (L) >60 mL/min   GFR calc Af Amer 27 (L) >60 mL/min   Anion gap 9 5 - 15  CBC     Status: Abnormal   Collection Time: 02/09/16  3:35 AM  Result Value Ref Range   WBC 13.6 (H) 4.0 - 10.5 K/uL   RBC 3.51 (L) 4.22 - 5.81 MIL/uL   Hemoglobin 10.1 (L) 13.0 - 17.0 g/dL   HCT 32.8 (L) 39.0 - 52.0 %   MCV 93.4 78.0 - 100.0 fL   MCH 28.8 26.0 - 34.0 pg   MCHC 30.8 30.0 - 36.0 g/dL   RDW 16.1 (H) 11.5 - 15.5 %   Platelets 273 150 - 400 K/uL   No results found.  Assessment/Plan: Diagnosis: Left BKA Labs and images independently reviewed.  Records reviewed and summated above. PT/OT for mobility, ADL's, strengthening,and wheelchair training Clean amputation daily with soap and water Monitor incision site for signs of infection or impending skin breakdown. Staples to remain in place for 3-4 weeks Stump shrinker, for edema control  Scar mobilization massaging to prevent soft tissue adherence Stump protector during therapies Prevent flexion contractures by implementing the following:   Encourage prone lying for 20-30 mins per day BID to avoid hip flexion  Contractures if medically appropriate;  Avoid pillow under knees when patient is lying in bed in order to prevent both  knee and hip flexion contractures;  Avoid prolonged sitting Post surgical pain control with oral medication Phantom limb pain control with physical modalities including desensitization techniques (gentle self massage to the residual stump,hot packs if sensation iintact,  [name]) and mirror therapy, TENS. If ineffective, consider pharmacological treatment for neuropathic pain (e.g gabapentin, pregabalin, amytriptalyine, duloxetine).  When using wheelchair, patient should have knee on amputated side fully extended with board under the seat cushion. Avoid injury to contralateral side  1. Does the need for close, 24 hr/day medical supervision in concert with the patient's rehab needs make it unreasonable for this patient to be served in a less intensive setting? Potentially  2. Co-Morbidities requiring supervision/potential complications: aortic stenosis status post aortic valve replacement/CABG (cont meds), HTN (monitor and provide prns in accordance with increased physical exertion and pain), diabetes mellitus (Monitor in accordance with exercise and adjust meds as necessary), bilateral carotid endarterectomy at [facility], chronic renal insufficiency (avoid nephrotoxic meds), peripheral vascular disease (cont meds), pain management (Biofeedback training with therapies to help reduce reliance on opiate pain medications, monitor pain control during therapies, and sedation at rest and titrate to maximum efficacy to ensure participation and gains in therapies), Acute blood loss anemia (transfuse if necessary to ensure appropriate perfusion for increased activity tolerance), leukocytosis (cont to monitor for signs and symptoms of infection, further workup if indicated) 3. Due to bladder management, safety, skin/wound care, disease management, pain management and patient education, does the patient  require 24 hr/day rehab nursing? Yes 4. Does the patient require coordinated care of a physician, rehab nurse, PT (1-2 hrs/day, 5 days/week) and OT (1-2 hrs/day, 5 days/week) to address physical and functional deficits in the context of the above medical diagnosis(es)? Potentially Addressing deficits in the following areas: balance, endurance, locomotion, transferring, bowel/bladder  control, bathing, dressing, toileting and psychosocial support 5. Can the patient actively participate in an intensive therapy program of at least 3 hrs of therapy per day at least 5 days per week? Potentially 6. The potential for patient to make measurable gains while on inpatient rehab is TBD 7. Anticipated functional outcomes upon discharge from inpatient rehab are TBD  with PT, TBD with OT, n/a with SLP. 8. Estimated rehab length of stay to reach the above functional goals is: TBD. 9. Does the patient have adequate social supports and living environment to accommodate these discharge functional goals? Potentially 10. Anticipated D/C setting: Home 11. Anticipated post D/C treatments: HH therapy and Home excercise program 12. Overall Rehab/Functional Prognosis: good  RECOMMENDATIONS: This patient's condition is appropriate for continued rehabilitative care in the following setting: Will need to await formal therapy consults.  Likely CIR. Patient has agreed to participate in recommended program. Yes Note that insurance prior authorization may be required for reimbursement for recommended care.  Comment: Rehab Admissions Coordinator to follow up.  Delice Lesch, MD 02/09/2016

## 2016-02-09 NOTE — Progress Notes (Addendum)
[facility]  Subjective  - Doing well over all.  He has muscle spasms that have bother him more than anything.    Objective 142/48 74 97.9 F (36.6 C) (Oral) 20 95%  Intake/Output Summary (Last 24 hours) at 02/09/16 0738 Last data filed at 02/09/16 [id number]  Gross per 24 hour  Intake   1300 ml  Output    825 ml  Net    475 ml    Left AKA dressing clean and dry    Assessment/Planning: POD #1 left BKA  Pending PT/OT and rehab Will add a muscle relaxer for spasms   Laurence Slate [facility] 02/09/2016 7:38 AM --  Laboratory Lab Results:  Recent Labs  02/08/16 1210 02/09/16 0335  WBC 9.4 13.6*  HGB 9.8* 10.1*  HCT 30.8* 32.8*  PLT 268 273   BMET  Recent Labs  02/08/16 1210 02/09/16 0335  NA  --  140  K  --  4.3  CL  --  110  CO2  --  21*  GLUCOSE  --  83  BUN  --  48*  CREATININE 3.14* 2.34*  CALCIUM  --  8.9    COAG Lab Results  Component Value Date   INR 1.22 02/06/2015   INR 1.55* 06/13/2012   INR 1.06 06/12/2012   No results found for: PTT    I have examined the patient, reviewed and agree with above.In complaint is some spasming in his left thigh. Fortunately creatinine has dropped to 2.3 after peaking at 4. Suspect this was related to dehydration preop since he was having severe pain and was eating and drinking very little.  Curt Jews, MD 02/09/2016 8:22 AM

## 2016-02-09 NOTE — Progress Notes (Signed)
I met with pt and his wife at bedside to discuss a possible inpt rehab admission pending insurance approval and bed availability when pt medically ready for d/c. Wife prefers inpt rehab here, [place] inpt rehab second and SNF elsewhere third. I will begin insurance approval and follow up tomorrow. [phone] 

## 2016-02-09 NOTE — Evaluation (Signed)
Physical Therapy Evaluation Patient Details Name: Collin Pearson MRN: [id number] DOB: 10/23/1926 Today's Date: 02/09/2016   History of Present Illness  Adm with gangrenous lt foot and underwent lt AKA on 02/08/16. PMH - CABG, Pacer, AVR, Clotting disorder, DM, HTN, PVD, CKD  Clinical Impression  Pt admitted with above diagnosis and presents to PT with functional limitations due to deficits listed below (See PT problem list). Pt needs skilled PT to maximize independence and safety to allow discharge to CIR. Pt has been very independent and motivated to maximize independence. Feel he would be good CIR candidate.     Follow Up Recommendations CIR    Equipment Recommendations  None recommended by PT    Recommendations for Other Services Rehab consult     Precautions / Restrictions Precautions Precautions: Fall      Mobility  Bed Mobility Overal bed mobility: Needs Assistance Bed Mobility: Rolling;Sidelying to Sit Rolling: Mod assist Sidelying to sit: +2 for physical assistance;Mod assist       General bed mobility comments: Assist to elevate trunk into sitting and bring hips to EOB.  Transfers Overall transfer level: Needs assistance Equipment used: Ambulation equipment used Transfers: Sit to/from [facility] Sit to Stand: +2 physical assistance;Mod assist Stand pivot transfers: +2 physical assistance;Mod assist       General transfer comment: Assist to bring hips up to stand with Stedy.  Ambulation/Gait                Stairs            Wheelchair Mobility    Modified Rankin (Stroke Patients Only)       Balance Overall balance assessment: Needs assistance Sitting-balance support: No upper extremity supported;Feet unsupported Sitting balance-Leahy Scale: Poor Sitting balance - Comments: Needs UE support or assist to maintain static sitting  Postural control: Posterior lean Standing balance support: Bilateral upper extremity  supported Standing balance-Leahy Scale: Poor Standing balance comment: UE support on Stedy and min to mod A to maintain standing. Verbal cues to stand more erect.                             Pertinent Vitals/Pain Pain Assessment: 0-10 Pain Location: lt phantom pain Pain Intervention(s): Limited activity within patient's tolerance;Monitored during session;Premedicated before session;Repositioned    Home Living Family/patient expects to be discharged to:: Private residence Living Arrangements: Spouse/significant other Available Help at Discharge: Family;Available 24 hours/day (wife works but daughter may be able to stay) Type of Home: House Home Access: Stairs to enter Entrance Stairs-Rails: Psychiatric nurse of Steps: [place]: One level Home Equipment: Environmental consultant - 2 wheels;Walker - 4 wheels;Cane - single point;Bedside commode;Tub bench;Wheelchair - manual      Prior Function Level of Independence: Needs assistance   Gait / Transfers Assistance Needed: Assist with transfers and very little amb in last 2 weeks. Sitting on rollator and family pushing him around. Until 2 weeks ago was amb with occasional use of cane.           Hand Dominance   Dominant Hand: Right    Extremity/Trunk Assessment   Upper Extremity Assessment: Defer to OT evaluation           Lower Extremity Assessment: RLE deficits/detail;LLE deficits/detail RLE Deficits / Details: grossly 3+/5 LLE Deficits / Details: AKA     Communication   Communication: HOH  Cognition Arousal/Alertness: Awake/alert Behavior During Therapy: WFL for tasks assessed/performed Overall Cognitive  Status: Within Functional Limits for tasks assessed                      General Comments      Exercises        Assessment/Plan    PT Assessment Patient needs continued PT services  PT Diagnosis Difficulty walking;Generalized weakness;Acute pain   PT Problem List Decreased  strength;Decreased activity tolerance;Decreased balance;Decreased mobility;Decreased knowledge of use of DME;Pain  PT Treatment Interventions DME instruction;Gait training;Functional mobility training;Therapeutic activities;Therapeutic exercise;Balance training;Patient/family education   PT Goals (Current goals can be found in the Care Plan section) Acute Rehab PT Goals Patient Stated Goal: Return home PT Goal Formulation: With patient Time For Goal Achievement: 02/23/16 Potential to Achieve Goals: Good    Frequency Min 3X/week   Barriers to discharge        Co-evaluation PT/OT/SLP Co-Evaluation/Treatment: Yes Reason for Co-Treatment: For patient/therapist safety PT goals addressed during session: Mobility/safety with mobility         End of Session Equipment Utilized During Treatment: Gait belt Activity Tolerance: Patient tolerated treatment well Patient left: in chair;with call bell/phone within reach;with chair alarm set;with family/visitor present Nurse Communication: Mobility status;Need for lift equipment         Time: [id number] PT Time Calculation (min) (ACUTE ONLY): 40 min   Charges:   PT Evaluation $PT Eval Moderate Complexity: 1 Procedure     PT G Codes:        Jasiel Apachito 03/07/2016, 3:05 PM [facility] PT [phone]

## 2016-02-10 ENCOUNTER — Encounter (HOSPITAL_COMMUNITY): Payer: Self-pay

## 2016-02-10 ENCOUNTER — Inpatient Hospital Stay (HOSPITAL_COMMUNITY)
Admission: RE | Admit: 2016-02-10 | Discharge: 2016-02-25 | DRG: 516 | Disposition: A | Discharge status: Home Health | Payer: BLUE CROSS/BLUE SHIELD | Source: Intra-hospital | Attending: Physical Medicine & Rehabilitation | Admitting: Physical Medicine & Rehabilitation

## 2016-02-10 DIAGNOSIS — Z955 Presence of coronary angioplasty implant and graft: Secondary | ICD-10-CM | POA: Diagnosis not present

## 2016-02-10 DIAGNOSIS — E119 Type 2 diabetes mellitus without complications: Secondary | ICD-10-CM

## 2016-02-10 DIAGNOSIS — I480 Paroxysmal atrial fibrillation: Secondary | ICD-10-CM | POA: Diagnosis present

## 2016-02-10 DIAGNOSIS — E1165 Type 2 diabetes mellitus with hyperglycemia: Secondary | ICD-10-CM | POA: Diagnosis present

## 2016-02-10 DIAGNOSIS — D62 Acute posthemorrhagic anemia: Secondary | ICD-10-CM | POA: Diagnosis present

## 2016-02-10 DIAGNOSIS — Z7982 Long term (current) use of aspirin: Secondary | ICD-10-CM | POA: Diagnosis not present

## 2016-02-10 DIAGNOSIS — Z79899 Other long term (current) drug therapy: Secondary | ICD-10-CM | POA: Diagnosis not present

## 2016-02-10 DIAGNOSIS — K219 Gastro-esophageal reflux disease without esophagitis: Secondary | ICD-10-CM | POA: Diagnosis present

## 2016-02-10 DIAGNOSIS — G546 Phantom limb syndrome with pain: Secondary | ICD-10-CM | POA: Diagnosis present

## 2016-02-10 DIAGNOSIS — Z4781 Encounter for orthopedic aftercare following surgical amputation: Secondary | ICD-10-CM | POA: Diagnosis present

## 2016-02-10 DIAGNOSIS — I10 Essential (primary) hypertension: Secondary | ICD-10-CM | POA: Diagnosis present

## 2016-02-10 DIAGNOSIS — K036 Deposits [accretions] on teeth: Secondary | ICD-10-CM | POA: Diagnosis not present

## 2016-02-10 DIAGNOSIS — K083 Retained dental root: Secondary | ICD-10-CM | POA: Diagnosis not present

## 2016-02-10 DIAGNOSIS — N179 Acute kidney failure, unspecified: Secondary | ICD-10-CM | POA: Diagnosis present

## 2016-02-10 DIAGNOSIS — K0530 Chronic periodontitis, unspecified: Secondary | ICD-10-CM | POA: Diagnosis not present

## 2016-02-10 DIAGNOSIS — D72829 Elevated white blood cell count, unspecified: Secondary | ICD-10-CM | POA: Diagnosis not present

## 2016-02-10 DIAGNOSIS — G629 Polyneuropathy, unspecified: Secondary | ICD-10-CM | POA: Diagnosis present

## 2016-02-10 DIAGNOSIS — N189 Chronic kidney disease, unspecified: Secondary | ICD-10-CM | POA: Diagnosis present

## 2016-02-10 DIAGNOSIS — D638 Anemia in other chronic diseases classified elsewhere: Secondary | ICD-10-CM | POA: Diagnosis present

## 2016-02-10 DIAGNOSIS — Z951 Presence of aortocoronary bypass graft: Secondary | ICD-10-CM

## 2016-02-10 DIAGNOSIS — E11649 Type 2 diabetes mellitus with hypoglycemia without coma: Secondary | ICD-10-CM | POA: Diagnosis not present

## 2016-02-10 DIAGNOSIS — R633 Feeding difficulties: Secondary | ICD-10-CM | POA: Diagnosis present

## 2016-02-10 DIAGNOSIS — Z89612 Acquired absence of left leg above knee: Secondary | ICD-10-CM

## 2016-02-10 DIAGNOSIS — Z953 Presence of xenogenic heart valve: Secondary | ICD-10-CM

## 2016-02-10 DIAGNOSIS — I2510 Atherosclerotic heart disease of native coronary artery without angina pectoris: Secondary | ICD-10-CM | POA: Diagnosis present

## 2016-02-10 DIAGNOSIS — K2100 Gastro-esophageal reflux disease with esophagitis, without bleeding: Secondary | ICD-10-CM | POA: Insufficient documentation

## 2016-02-10 DIAGNOSIS — S78119A Complete traumatic amputation at level between unspecified hip and knee, initial encounter: Secondary | ICD-10-CM

## 2016-02-10 DIAGNOSIS — K0889 Other specified disorders of teeth and supporting structures: Secondary | ICD-10-CM | POA: Diagnosis not present

## 2016-02-10 DIAGNOSIS — E1151 Type 2 diabetes mellitus with diabetic peripheral angiopathy without gangrene: Secondary | ICD-10-CM | POA: Diagnosis present

## 2016-02-10 DIAGNOSIS — E1122 Type 2 diabetes mellitus with diabetic chronic kidney disease: Secondary | ICD-10-CM | POA: Diagnosis present

## 2016-02-10 DIAGNOSIS — I129 Hypertensive chronic kidney disease with stage 1 through stage 4 chronic kidney disease, or unspecified chronic kidney disease: Secondary | ICD-10-CM | POA: Diagnosis present

## 2016-02-10 DIAGNOSIS — Z9889 Other specified postprocedural states: Secondary | ICD-10-CM

## 2016-02-10 DIAGNOSIS — Z794 Long term (current) use of insulin: Secondary | ICD-10-CM

## 2016-02-10 DIAGNOSIS — N184 Chronic kidney disease, stage 4 (severe): Secondary | ICD-10-CM | POA: Diagnosis present

## 2016-02-10 DIAGNOSIS — S78112A Complete traumatic amputation at level between left hip and knee, initial encounter: Secondary | ICD-10-CM

## 2016-02-10 DIAGNOSIS — K089 Disorder of teeth and supporting structures, unspecified: Secondary | ICD-10-CM | POA: Insufficient documentation

## 2016-02-10 DIAGNOSIS — N183 Chronic kidney disease, stage 3 (moderate): Secondary | ICD-10-CM | POA: Diagnosis not present

## 2016-02-10 LAB — BASIC METABOLIC PANEL
ANION GAP: 10 (ref 5–15)
BUN: 41 mg/dL — ABNORMAL HIGH (ref 6–20)
CO2: 20 mmol/L — AB (ref 22–32)
Calcium: 8.6 mg/dL — ABNORMAL LOW (ref 8.9–10.3)
Chloride: 107 mmol/L (ref 101–111)
Creatinine, Ser: 1.94 mg/dL — ABNORMAL HIGH (ref 0.61–1.24)
GFR calc Af Amer: 34 mL/min — ABNORMAL LOW (ref >60)
GFR calc non Af Amer: 29 mL/min — ABNORMAL LOW (ref >60)
GLUCOSE: 118 mg/dL — AB (ref 65–99)
POTASSIUM: 4.2 mmol/L (ref 3.5–5.1)
Sodium: 137 mmol/L (ref 135–145)

## 2016-02-10 LAB — GLUCOSE, CAPILLARY
GLUCOSE-CAPILLARY: 165 mg/dL — AB (ref 65–99)
GLUCOSE-CAPILLARY: 88 mg/dL (ref 65–99)
GLUCOSE-CAPILLARY: 166 mg/dL — AB (ref 65–99)
Glucose-Capillary: 63 mg/dL — ABNORMAL LOW (ref 65–99)
Glucose-Capillary: 101 mg/dL — ABNORMAL HIGH (ref 65–99)
Glucose-Capillary: 126 mg/dL — ABNORMAL HIGH (ref 65–99)

## 2016-02-10 LAB — CBC
HCT: 31.8 % — ABNORMAL LOW (ref 39.0–52.0)
HEMOGLOBIN: 9.7 g/dL — AB (ref 13.0–17.0)
MCH: 28.5 pg (ref 26.0–34.0)
MCHC: 30.5 g/dL (ref 30.0–36.0)
MCV: 93.5 fL (ref 78.0–100.0)
PLATELETS: 271 10*3/uL (ref 150–400)
RBC: 3.4 MIL/uL — AB (ref 4.22–5.81)
RDW: 15.9 % — ABNORMAL HIGH (ref 11.5–15.5)
WBC: 11.9 10*3/uL — AB (ref 4.0–10.5)

## 2016-02-10 MED ORDER — POLYETHYLENE GLYCOL 3350 17 G PO PACK
17.0000 g | PACK | Freq: Once | ORAL | Status: AC
  Administered 2016-02-10: 17 g via ORAL
  Filled 2016-02-10: qty 1

## 2016-02-10 MED ORDER — PANTOPRAZOLE SODIUM 40 MG PO TBEC
40.0000 mg | DELAYED_RELEASE_TABLET | Freq: Every day | ORAL | Status: DC
  Administered 2016-02-11 – 2016-02-25 (×15): 40 mg via ORAL
  Filled 2016-02-10 (×16): qty 1

## 2016-02-10 MED ORDER — ENOXAPARIN SODIUM 30 MG/0.3ML ~~LOC~~ SOLN
30.0000 mg | SUBCUTANEOUS | Status: DC
  Administered 2016-02-11 – 2016-02-22 (×12): 30 mg via SUBCUTANEOUS
  Filled 2016-02-10 (×12): qty 0.3

## 2016-02-10 MED ORDER — TAMSULOSIN HCL 0.4 MG PO CAPS
0.4000 mg | ORAL_CAPSULE | Freq: Every day | ORAL | Status: DC
  Administered 2016-02-10 – 2016-02-24 (×15): 0.4 mg via ORAL
  Filled 2016-02-10 (×15): qty 1

## 2016-02-10 MED ORDER — DIPHENHYDRAMINE HCL 12.5 MG/5ML PO ELIX: 12.5000 mg | ORAL_SOLUTION | Freq: Four times a day (QID) | ORAL | Status: DC | PRN

## 2016-02-10 MED ORDER — GABAPENTIN 100 MG PO CAPS
100.0000 mg | ORAL_CAPSULE | Freq: Every day | ORAL | Status: DC
  Administered 2016-02-10 – 2016-02-24 (×15): 100 mg via ORAL
  Filled 2016-02-10 (×15): qty 1

## 2016-02-10 MED ORDER — ATORVASTATIN CALCIUM 40 MG PO TABS
40.0000 mg | ORAL_TABLET | Freq: Every day | ORAL | Status: DC
  Administered 2016-02-10 – 2016-02-24 (×15): 40 mg via ORAL
  Filled 2016-02-10 (×15): qty 1

## 2016-02-10 MED ORDER — PROCHLORPERAZINE 25 MG RE SUPP
12.5000 mg | Freq: Four times a day (QID) | RECTAL | Status: DC | PRN
  Filled 2016-02-10: qty 1

## 2016-02-10 MED ORDER — GABAPENTIN 100 MG PO CAPS: 100.0000 mg | ORAL_CAPSULE | Freq: Every day | ORAL | Status: DC

## 2016-02-10 MED ORDER — ADULT MULTIVITAMIN W/MINERALS CH
1.0000 | ORAL_TABLET | Freq: Every day | ORAL | Status: DC
  Administered 2016-02-11 – 2016-02-25 (×15): 1 via ORAL
  Filled 2016-02-10 (×16): qty 1

## 2016-02-10 MED ORDER — INSULIN ASPART 100 UNIT/ML ~~LOC~~ SOLN
0.0000 [IU] | Freq: Three times a day (TID) | SUBCUTANEOUS | Status: DC
  Administered 2016-02-11: 5 [IU] via SUBCUTANEOUS
  Administered 2016-02-11 – 2016-02-12 (×2): 3 [IU] via SUBCUTANEOUS
  Administered 2016-02-12 – 2016-02-13 (×4): 2 [IU] via SUBCUTANEOUS
  Administered 2016-02-14: 3 [IU] via SUBCUTANEOUS
  Administered 2016-02-14: 2 [IU] via SUBCUTANEOUS
  Administered 2016-02-15: 3 [IU] via SUBCUTANEOUS
  Administered 2016-02-15: 2 [IU] via SUBCUTANEOUS
  Administered 2016-02-16: 3 [IU] via SUBCUTANEOUS
  Administered 2016-02-16 – 2016-02-18 (×4): 2 [IU] via SUBCUTANEOUS
  Administered 2016-02-19 – 2016-02-20 (×3): 1 [IU] via SUBCUTANEOUS
  Administered 2016-02-20 – 2016-02-21 (×2): 2 [IU] via SUBCUTANEOUS
  Administered 2016-02-21: 3 [IU] via SUBCUTANEOUS
  Administered 2016-02-22 – 2016-02-23 (×3): 2 [IU] via SUBCUTANEOUS
  Administered 2016-02-23 – 2016-02-24 (×2): 5 [IU] via SUBCUTANEOUS
  Administered 2016-02-24 – 2016-02-25 (×3): 2 [IU] via SUBCUTANEOUS

## 2016-02-10 MED ORDER — ISOSORBIDE MONONITRATE ER 30 MG PO TB24
60.0000 mg | ORAL_TABLET | Freq: Every day | ORAL | Status: DC
  Administered 2016-02-11 – 2016-02-25 (×15): 60 mg via ORAL
  Filled 2016-02-10 (×18): qty 2

## 2016-02-10 MED ORDER — ACETAMINOPHEN 325 MG PO TABS
325.0000 mg | ORAL_TABLET | ORAL | Status: DC | PRN
  Administered 2016-02-21: 650 mg via ORAL
  Filled 2016-02-10: qty 2

## 2016-02-10 MED ORDER — OXYCODONE-ACETAMINOPHEN 5-325 MG PO TABS: 1.0000 | ORAL_TABLET | ORAL | Status: DC | PRN

## 2016-02-10 MED ORDER — FERROUS SULFATE 75 (15 FE) MG/ML PO SOLN
160.0000 mg | Freq: Two times a day (BID) | ORAL | Status: DC
  Administered 2016-02-10 – 2016-02-11 (×2): 157.5 mg via ORAL
  Filled 2016-02-10 (×3): qty 2.1

## 2016-02-10 MED ORDER — PROCHLORPERAZINE EDISYLATE 5 MG/ML IJ SOLN: 5.0000 mg | Freq: Four times a day (QID) | INTRAMUSCULAR | Status: DC | PRN

## 2016-02-10 MED ORDER — FUROSEMIDE 40 MG PO TABS
80.0000 mg | ORAL_TABLET | Freq: Every day | ORAL | Status: DC
Start: 2016-02-11 — End: 2016-02-12
  Administered 2016-02-11 – 2016-02-12 (×2): 80 mg via ORAL
  Filled 2016-02-10 (×2): qty 2

## 2016-02-10 MED ORDER — INSULIN DETEMIR 100 UNIT/ML ~~LOC~~ SOLN
20.0000 [IU] | Freq: Every day | SUBCUTANEOUS | Status: DC
  Administered 2016-02-10: 20 [IU] via SUBCUTANEOUS
  Filled 2016-02-10 (×2): qty 0.2

## 2016-02-10 MED ORDER — DOCUSATE SODIUM 100 MG PO CAPS: 100.0000 mg | ORAL_CAPSULE | ORAL | Status: DC

## 2016-02-10 MED ORDER — GUAIFENESIN-DM 100-10 MG/5ML PO SYRP: 15.0000 mL | ORAL_SOLUTION | ORAL | Status: DC | PRN

## 2016-02-10 MED ORDER — HYDRALAZINE HCL 50 MG PO TABS
75.0000 mg | ORAL_TABLET | Freq: Every day | ORAL | Status: DC
  Administered 2016-02-11: 75 mg via ORAL
  Filled 2016-02-10 (×2): qty 1

## 2016-02-10 MED ORDER — METHOCARBAMOL 500 MG PO TABS
500.0000 mg | ORAL_TABLET | Freq: Four times a day (QID) | ORAL | Status: DC | PRN
  Administered 2016-02-13 – 2016-02-14 (×2): 500 mg via ORAL
  Filled 2016-02-10 (×3): qty 1

## 2016-02-10 MED ORDER — OXYCODONE HCL 5 MG PO TABS
5.0000 mg | ORAL_TABLET | ORAL | Status: DC | PRN
  Administered 2016-02-11 – 2016-02-20 (×24): 10 mg via ORAL
  Administered 2016-02-20: 5 mg via ORAL
  Administered 2016-02-21 – 2016-02-22 (×2): 10 mg via ORAL
  Administered 2016-02-22: 5 mg via ORAL
  Administered 2016-02-22 – 2016-02-24 (×2): 10 mg via ORAL
  Filled 2016-02-10 (×2): qty 2
  Filled 2016-02-10: qty 1
  Filled 2016-02-10 (×28): qty 2

## 2016-02-10 MED ORDER — BISACODYL 10 MG RE SUPP
10.0000 mg | Freq: Every day | RECTAL | Status: DC | PRN
  Administered 2016-02-12: 10 mg via RECTAL
  Filled 2016-02-10: qty 1

## 2016-02-10 MED ORDER — TRAZODONE HCL 50 MG PO TABS
25.0000 mg | ORAL_TABLET | Freq: Every evening | ORAL | Status: DC | PRN
  Administered 2016-02-11 – 2016-02-13 (×3): 50 mg via ORAL
  Filled 2016-02-10 (×3): qty 1

## 2016-02-10 MED ORDER — ASPIRIN EC 81 MG PO TBEC
81.0000 mg | DELAYED_RELEASE_TABLET | Freq: Two times a day (BID) | ORAL | Status: DC
  Administered 2016-02-10 – 2016-02-25 (×30): 81 mg via ORAL
  Filled 2016-02-10 (×31): qty 1

## 2016-02-10 MED ORDER — PROCHLORPERAZINE MALEATE 5 MG PO TABS: 5.0000 mg | ORAL_TABLET | Freq: Four times a day (QID) | ORAL | Status: DC | PRN

## 2016-02-10 MED ORDER — SENNOSIDES-DOCUSATE SODIUM 8.6-50 MG PO TABS
1.0000 | ORAL_TABLET | Freq: Every day | ORAL | Status: DC
  Administered 2016-02-10 – 2016-02-24 (×15): 1 via ORAL
  Filled 2016-02-10 (×15): qty 1

## 2016-02-10 MED ORDER — ALUM & MAG HYDROXIDE-SIMETH 200-200-20 MG/5ML PO SUSP: 15.0000 mL | ORAL | Status: DC | PRN

## 2016-02-10 MED ORDER — AMLODIPINE BESYLATE 10 MG PO TABS
10.0000 mg | ORAL_TABLET | Freq: Every day | ORAL | Status: DC
  Administered 2016-02-11 – 2016-02-12 (×2): 10 mg via ORAL
  Filled 2016-02-10 (×2): qty 1

## 2016-02-10 MED ORDER — FLEET ENEMA 7-19 GM/118ML RE ENEM: 1.0000 | ENEMA | Freq: Once | RECTAL | Status: DC | PRN

## 2016-02-10 MED ORDER — GLIPIZIDE 10 MG PO TABS
10.0000 mg | ORAL_TABLET | Freq: Two times a day (BID) | ORAL | Status: DC
  Administered 2016-02-10 – 2016-02-21 (×22): 10 mg via ORAL
  Filled 2016-02-10 (×22): qty 1

## 2016-02-10 NOTE — Progress Notes (Signed)
Patient arrived with RN, spouse and daughter to unit. Belongings unpacked. Patient oriented to unit and given schedule. No pain. Resting comfortably, about to eat dinner.

## 2016-02-10 NOTE — Progress Notes (Signed)
Ankit Lorie Phenix, MD Physician Addendum Physical Medicine and Rehabilitation Consult Note 02/09/2016 5:56 AM  Related encounter: Admission (Current) from 02/08/2016 in [place] Collapse All        Physical Medicine and Rehabilitation Consult Reason for Consult: Left BKA Referring Physician: Dr. Donnetta Hutching   HPI: Collin Pearson is a 82 y.o. right handed male with history of aortic stenosis status post aortic valve replacement/CABG maintained on aspirin 81 mg daily, hypertension, diabetes mellitus, bilateral carotid endarterectomy at [facility], chronic renal insufficiency with creatinine 3.14-4.06 and peripheral vascular disease. Per chart review patient lives with wife. Independent with a walker prior to admission. Wife works during the day. He has a daughter who can assist on discharge. Two-level home with bedroom downstairs and 4 steps to entry. Presented 02/08/2016 with gangrenous toes to the left foot and followed at the [facility] wound center. Developed severe erythema of his toes and breakdown of the pretibial tissue above his ankle on the left. Recent arteriograms in April showed calcified vessels inability to obtain ankle arm index. 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 Donnetta Hutching. Hospital course pain management. Acute blood loss anemia 10.1 and monitored. Subcutaneous Lovenox for DVT prophylaxis. Physical and occupational therapy evaluations pending. M.D. has requested physical medicine rehabilitation consult   Review of Systems  Constitutional: Negative for fever and chills.  HENT: Negative for hearing loss.  Eyes: Negative for blurred vision and double vision.  Respiratory: Positive for shortness of breath. Negative for cough.  Cardiovascular: Negative for chest pain.  Gastrointestinal: Positive for constipation. Negative for nausea and vomiting.   GERD  Genitourinary: Positive  for urgency. Negative for dysuria and hematuria.  Musculoskeletal: Positive for joint pain.  Skin: Negative for rash.  Neurological: Positive for weakness. Negative for seizures and headaches.  Psychiatric/Behavioral:   Anxiety  All other systems reviewed and are negative.  Past Medical History  Diagnosis Date  . Clotting disorder ([place])   . Coronary artery disease   . AS (aortic stenosis)     status post aortic valve replacement with an [employer] model 38 T. fracture bioprosthesis  . Anginal pain ([place])   . Hypertension   . Diabetes mellitus   . Peripheral vascular disease ([place])   . GERD (gastroesophageal reflux disease)   . CKD (chronic kidney disease) stage 4, GFR 15-29 ml/min (HCC)   . PVD (peripheral vascular disease), with hsx of bilateral carotid endarterectomies at [facility], and bilateral carotid stenting 05/18/2012  . Heart murmur   . PAF (paroxysmal atrial fibrillation), s/p cabg 06/18/2012  . Critical lower limb ischemia   . Arthritis   . Anxiety    Past Surgical History  Procedure Laterality Date  . Endarterectomy    . Prostate surgery    . Cardiac stents    . Cardiac catheterization    . Coronary artery bypass graft  06/13/2012    Procedure: CORONARY ARTERY BYPASS GRAFTING (CABG); Surgeon: Grace Isaac, MD; Location: [place]; Service: Open Heart Surgery; Laterality: N/A; must start at 70 - appointment at 0800  . Aortic valve replacement  06/13/2012    Procedure: AORTIC VALVE REPLACEMENT (AVR); Surgeon: Grace Isaac, MD; Location: [place]; Service: Open Heart Surgery; Laterality: N/A;  . Cardiac stress test  06/03/2010    Mild anterolateral ischemis  . Left and right heart catheterization with coronary angiogram N/A 05/18/2012    Procedure: LEFT  AND RIGHT HEART CATHETERIZATION WITH CORONARY ANGIOGRAM; Surgeon: Lorretta Harp, MD;  Location[facility] C[facility] LAB; Service: Cardiovascular; Laterality: N/A;  . Carotid angiogram N/A 05/18/2012    Procedure: CAROTID ANGIOGRAM; Surgeon: Lorretta Harp, MD; Location: [facility] CATH LAB; Service: Cardiovascular; Laterality: N/A;  . Cardiac valve replacement    . Above knee leg amputation Left 02/08/2016   Family History  Problem Relation Age of Onset  . Heart disease Mother 54  . Heart disease Father 71  . CAD Brother   . Heart disease Maternal Uncle   . Heart disease Paternal Uncle   . Heart disease Maternal Grandfather 68   Social History:  reports that he has never smoked. He has never used smokeless tobacco. He reports that he does not drink alcohol or use illicit drugs. Allergies: No Known Allergies Medications Prior to Admission  Medication Sig Dispense Refill  . amLODipine (NORVASC) 10 MG tablet TAKE 1 TABLET DAILY 90 tablet 2  . aspirin EC 81 MG tablet Take 81 mg by mouth 2 (two) times daily.    Marland Kitchen atorvastatin (LIPITOR) 40 MG tablet TAKE 1 TABLET DAILY AT BEDTIME 90 tablet 1  . BD INSULIN SYRINGE ULTRAFINE 31G X 5/16" 1 ML MISC Reported on 02/02/2016    . clotrimazole-betamethasone (LOTRISONE) cream Apply 1 application topically daily as needed (dry skin, itching). Reported on 02/02/2016    . docusate sodium (STOOL SOFTENER) 100 MG capsule Take 100 mg by mouth 3 (three) times a week. Reported on 02/02/2016    . Epoetin Alfa (PROCRIT IJ) Inject 1 Dose as directed as needed. Reported on 02/02/2016    . fish oil-omega-3 fatty acids 1000 MG capsule Take 1 g by mouth 3 (three) times daily. Reported on 02/02/2016    . furosemide (LASIX) 80 MG tablet Take 1 tablet (80 mg total) by mouth daily. Take additional 40mg  every other day in the evening if weight goes above 134lbs 135 tablet 3  . glipiZIDE (GLUCOTROL) 10 MG tablet Take 10 mg by mouth 2 (two) times daily. Reported on 02/02/2016      . hydrALAZINE (APRESOLINE) 25 MG tablet Take 3 tablets (75 mg total) by mouth 3 (three) times daily. (Patient taking differently: Take 75 mg by mouth daily. ) 60 tablet 3  . Iron TABS Take 160 mg by mouth 2 (two) times daily.     . isosorbide mononitrate (IMDUR) 60 MG 24 hr tablet Take 1 tablet (60 mg total) by mouth daily. 30 tablet 1  . LEVEMIR 100 UNIT/ML injection Inject 20 Units into the skin at bedtime.    . Multiple Vitamin (MULTIVITAMIN WITH MINERALS) TABS Take 1 tablet by mouth daily.    . ONE TOUCH ULTRA TEST test strip Reported on 02/02/2016    . oxyCODONE-acetaminophen (PERCOCET) 10-325 MG tablet Take 1 tablet by mouth every 4 (four) hours as needed for pain.    . pantoprazole (PROTONIX) 40 MG tablet Take 40 mg by mouth daily.    . silver sulfADIAZINE (SILVADENE) 1 % cream Apply 1 application topically as needed (dry skin). Reported on 02/02/2016    . sulfamethoxazole-trimethoprim (BACTRIM DS) 800-160 MG tablet Take 1 tablet by mouth 2 (two) times daily.    . Tamsulosin HCl (FLOMAX) 0.4 MG CAPS Take 0.4 mg by mouth. One tablet in the morning and 1/2 tablet in the evening    . Vitamin D, Ergocalciferol, (DRISDOL) 50000 units CAPS capsule Take 50,000 Units by mouth every 7 (seven) days. Reported on 02/02/2016    .  vitamin E (VITAMIN E) 400 UNIT capsule Take 400 Units by mouth daily. Reported on 02/02/2016      Home: Home Living Family/patient expects to be discharged to:: Unsure Living Arrangements: Spouse/significant other  Functional History:   Functional Status:  Mobility:          ADL:    Cognition: Cognition Orientation Level: Oriented X4    Blood pressure 142/48, pulse 74, temperature 97.9 F (36.6 C), temperature source Oral, resp. rate 20, weight 63.05 kg (139 lb), SpO2 95 %. Physical Exam  Vitals reviewed. Constitutional: He is oriented to person, place, and time. He appears well-developed and  well-nourished.  HENT:  Head: Normocephalic and atraumatic.  Eyes: Conjunctivae and EOM are normal.  Neck: Normal range of motion. Neck supple. No thyromegaly present.  Cardiovascular: Normal rate and regular rhythm.  Respiratory: Effort normal and breath sounds normal. No respiratory distress.  GI: Soft. Bowel sounds are normal. He exhibits no distension.  Musculoskeletal: He exhibits tenderness. He exhibits no edema.  Left BKA  Neurological: He is alert and oriented to person, place, and time.  B/l UE, RLE 5/5 proximal to distal LLE: Hip flexion 5/5  Skin: Skin is warm and dry.  Surgical amputation site is dressed appropriately tender. Some ischemic changes to right lower extremity  Psychiatric: He has a normal mood and affect. His behavior is normal.     Lab Results Last 24 Hours    Results for orders placed or performed during the hospital encounter of 02/08/16 (from the past 24 hour(s))  Glucose, capillary Status: Abnormal   Collection Time: 02/08/16 6:09 AM  Result Value Ref Range   Glucose-Capillary 121 (H) 65 - 99 mg/dL   Comment 1 Notify RN    Comment 2 Document in Chart   Glucose, capillary Status: Abnormal   Collection Time: 02/08/16 8:54 AM  Result Value Ref Range   Glucose-Capillary 201 (H) 65 - 99 mg/dL   Comment 1 Notify RN    Comment 2 Document in Chart   Glucose, capillary Status: Abnormal   Collection Time: 02/08/16 10:37 AM  Result Value Ref Range   Glucose-Capillary 292 (H) 65 - 99 mg/dL   Comment 1 Notify RN   Glucose, capillary Status: Abnormal   Collection Time: 02/08/16 11:08 AM  Result Value Ref Range   Glucose-Capillary 200 (H) 65 - 99 mg/dL  CBC Status: Abnormal   Collection Time: 02/08/16 12:10 PM  Result Value Ref Range   WBC 9.4 4.0 - 10.5 K/uL   RBC 3.26 (L) 4.22 - 5.81 MIL/uL   Hemoglobin 9.8 (L) 13.0 - 17.0 g/dL   HCT 30.8 (L) 39.0  - 52.0 %   MCV 94.5 78.0 - 100.0 fL   MCH 30.1 26.0 - 34.0 pg   MCHC 31.8 30.0 - 36.0 g/dL   RDW 15.9 (H) 11.5 - 15.5 %   Platelets 268 150 - 400 K/uL  Creatinine, serum Status: Abnormal   Collection Time: 02/08/16 12:10 PM  Result Value Ref Range   Creatinine, Ser 3.14 (H) 0.61 - 1.24 mg/dL   GFR calc non Af Amer 16 (L) >60 mL/min   GFR calc Af Amer 19 (L) >60 mL/min  Glucose, capillary Status: Abnormal   Collection Time: 02/08/16 4:17 PM  Result Value Ref Range   Glucose-Capillary 282 (H) 65 - 99 mg/dL  Glucose, capillary Status: Abnormal   Collection Time: 02/08/16 9:12 PM  Result Value Ref Range   Glucose-Capillary 132 (H) 65 - 99 mg/dL  Basic  metabolic panel Status: Abnormal   Collection Time: 02/09/16 3:35 AM  Result Value Ref Range   Sodium 140 135 - 145 mmol/L   Potassium 4.3 3.5 - 5.1 mmol/L   Chloride 110 101 - 111 mmol/L   CO2 21 (L) 22 - 32 mmol/L   Glucose, Bld 83 65 - 99 mg/dL   BUN 48 (H) 6 - 20 mg/dL   Creatinine, Ser 2.34 (H) 0.61 - 1.24 mg/dL   Calcium 8.9 8.9 - 10.3 mg/dL   GFR calc non Af Amer 23 (L) >60 mL/min   GFR calc Af Amer 27 (L) >60 mL/min   Anion gap 9 5 - 15  CBC Status: Abnormal   Collection Time: 02/09/16 3:35 AM  Result Value Ref Range   WBC 13.6 (H) 4.0 - 10.5 K/uL   RBC 3.51 (L) 4.22 - 5.81 MIL/uL   Hemoglobin 10.1 (L) 13.0 - 17.0 g/dL   HCT 32.8 (L) 39.0 - 52.0 %   MCV 93.4 78.0 - 100.0 fL   MCH 28.8 26.0 - 34.0 pg   MCHC 30.8 30.0 - 36.0 g/dL   RDW 16.1 (H) 11.5 - 15.5 %   Platelets 273 150 - 400 K/uL      Imaging Results (Last 48 hours)    No results found.    Assessment/Plan: Diagnosis: Left BKA Labs and images independently reviewed. Records reviewed and summated above. PT/OT for mobility, ADL's, strengthening,and wheelchair training Clean  amputation daily with soap and water Monitor incision site for signs of infection or impending skin breakdown. Staples to remain in place for 3-4 weeks Stump shrinker, for edema control  Scar mobilization massaging to prevent soft tissue adherence Stump protector during therapies Prevent flexion contractures by implementing the following:  Encourage prone lying for 20-30 mins per day BID to avoid hip flexion Contractures if medically appropriate; Avoid pillow under knees when patient is lying in bed in order to prevent both knee and hip flexion contractures; Avoid prolonged sitting Post surgical pain control with oral medication Phantom limb pain control with physical modalities including desensitization techniques (gentle self massage to the residual stump,hot packs if sensation iintact, [name]) and mirror therapy, TENS. If ineffective, consider pharmacological treatment for neuropathic pain (e.g gabapentin, pregabalin, amytriptalyine, duloxetine).  When using wheelchair, patient should have knee on amputated side fully extended with board under the seat cushion. Avoid injury to contralateral side  1. Does the need for close, 24 hr/day medical supervision in concert with the patient's rehab needs make it unreasonable for this patient to be served in a less intensive setting? Potentially  2. Co-Morbidities requiring supervision/potential complications: aortic stenosis status post aortic valve replacement/CABG (cont meds), HTN (monitor and provide prns in accordance with increased physical exertion and pain), diabetes mellitus (Monitor in accordance with exercise and adjust meds as necessary), bilateral carotid endarterectomy at [facility], chronic renal insufficiency (avoid nephrotoxic meds), peripheral vascular disease (cont meds), pain management (Biofeedback training with therapies to help reduce reliance on opiate pain  medications, monitor pain control during therapies, and sedation at rest and titrate to maximum efficacy to ensure participation and gains in therapies), Acute blood loss anemia (transfuse if necessary to ensure appropriate perfusion for increased activity tolerance), leukocytosis (cont to monitor for signs and symptoms of infection, further workup if indicated) 3. Due to bladder management, safety, skin/wound care, disease management, pain management and patient education, does the patient require 24 hr/day rehab nursing? Yes 4. Does the patient require coordinated care of a physician, rehab  nurse, PT (1-2 hrs/day, 5 days/week) and OT (1-2 hrs/day, 5 days/week) to address physical and functional deficits in the context of the above medical diagnosis(es)? Potentially Addressing deficits in the following areas: balance, endurance, locomotion, transferring, bowel/bladder control, bathing, dressing, toileting and psychosocial support 5. Can the patient actively participate in an intensive therapy program of at least 3 hrs of therapy per day at least 5 days per week? Potentially 6. The potential for patient to make measurable gains while on inpatient rehab is TBD 7. Anticipated functional outcomes upon discharge from inpatient rehab are TBD with PT, TBD with OT, n/a with SLP. 8. Estimated rehab length of stay to reach the above functional goals is: TBD. 9. Does the patient have adequate social supports and living environment to accommodate these discharge functional goals? Potentially 10. Anticipated D/C setting: Home 11. Anticipated post D/C treatments: HH therapy and Home excercise program 12. Overall Rehab/Functional Prognosis: good  RECOMMENDATIONS: This patient's condition is appropriate for continued rehabilitative care in the following setting: Will need to await formal therapy consults. Likely CIR. Patient has agreed to participate in recommended program. Yes Note that insurance prior  authorization may be required for reimbursement for recommended care.  Comment: Rehab Admissions Coordinator to follow up.  Delice Lesch, MD 02/09/2016       Revision History     Date/Time User Provider Type Action   02/09/2016 1:26 PM Ankit Lorie Phenix, MD Physician Addend   02/09/2016 1:19 PM Ankit Lorie Phenix, MD Physician Sign   02/09/2016 6:53 AM Cathlyn Parsons, PA-C Physician Assistant [place]   View Details Report       Routing History     Date/Time From To Method   02/09/2016 1:26 PM Ankit Lorie Phenix, MD Estill Bamberg. Megan Salon, MD Fax

## 2016-02-10 NOTE — Progress Notes (Signed)
Collin Gong, RN Rehab Admission Coordinator Signed Physical Medicine and Rehabilitation PMR Pre-admission 02/10/2016 2:20 PM  Related encounter: Admission (Current) from 02/08/2016 in [facility] Collapse All   PMR Admission Coordinator Pre-Admission Assessment  Patient: Collin Pearson is an 82 y.o., male MRN: [id number] DOB: May 06, 1927 Height:   Weight: 58.015 kg (127 lb 14.4 oz)  Insurance Information HMO: PPO: yes PCP: IPA: 80/20: OTHER:  PRIMARY: [employer] [place] Policy#: [id number] Subscriber: wife CM Name: Carmelia Bake Phone#: [phone] Fax#: [phone] Pre-Cert#: [id number] approved 6/28 through 02/17/16 Employer: wife's employer Benefits: Phone #: [phone] Name: 02/10/16 Eff. Date: 08/16/15 Deduct: $650 met Out of Pocket Max: $2650 met Life Max: none CIR: 80% SNF: 80% 120 days per benefit period Outpatient: 80% Co-Pay: 30 visits each Home Health: 80% Co-Pay: 120 visits combined DME: 80% Co-Pay: 20% Providers: in network  SECONDARY: Medicare part A only Policy#: [id number] a Subscriber: pt   Medicaid Application Date: Case Manager:  Disability Application Date: Case Worker:   Emergency Facilities manager Information    Name Relation Home Work [place] [phone]  [phone]   [place] Daughter [phone]  [phone]   Lampley,Imelda Daughter   [phone]     Current Medical History  Patient Admitting Diagnosis: Left AKA  History of Present Illness: Collin Pearson is a 82 y.o. Male with history of HTN, DM, PAF, CAD with AS s/p CABG with AVR, CKD stage IV, PAD s/p B-carotid stents and  history of calcified vessels BLE. He developed gangrenous changes left foot with severe erythema and skin breakdown of pretibial tissue with severe pain X 1 month and AKA recommended due to inability to salvage limb. He was admitted on 06/26 for L-AKA by Dr. Donnetta Hutching. Post op with phantom pain as well as muscle spasms. Has been seen by [facility] for prosthetic consult.  Past Medical History  Past Medical History  Diagnosis Date  . Clotting disorder ([place])   . Coronary artery disease   . AS (aortic stenosis)     status post aortic valve replacement with an [employer] model 63 T. fracture bioprosthesis  . Anginal pain ([place])   . Hypertension   . Diabetes mellitus   . Peripheral vascular disease ([place])   . GERD (gastroesophageal reflux disease)   . CKD (chronic kidney disease) stage 4, GFR 15-29 ml/min (HCC)   . PVD (peripheral vascular disease), with hsx of bilateral carotid endarterectomies at [facility], and bilateral carotid stenting 05/18/2012  . Heart murmur   . PAF (paroxysmal atrial fibrillation), s/p cabg 06/18/2012  . Critical lower limb ischemia   . Arthritis   . Anxiety     Family History  family history includes CAD in his brother; Heart disease in his maternal uncle and paternal uncle; Heart disease (age of onset: 51) in his mother; Heart disease (age of onset: 32) in his father and maternal grandfather.  Prior Rehab/Hospitalizations:  Has the patient had major surgery during 100 days prior to admission? No  Current Medications   Current facility-administered medications:  . 0.9 % sodium chloride infusion, , Intravenous, Continuous, Ulyses Amor, PA-C, Last Rate: 100 mL/hr at 02/08/16 2309 . acetaminophen (TYLENOL) tablet 325-650 mg, 325-650 mg, Oral, Q4H PRN **OR** acetaminophen (TYLENOL) suppository 325-650 mg, 325-650 mg, Rectal, Q4H PRN, Ulyses Amor, PA-C . alum & mag hydroxide-simeth (MAALOX/MYLANTA)  200-200-20 MG/5ML suspension 15-30 mL, 15-30 mL, Oral, Q2H PRN, Ulyses Amor, PA-C .  amLODipine (NORVASC) tablet 10 mg, 10 mg, Oral, Daily, Ulyses Amor, PA-C, 10 mg at 02/10/16 1103 . aspirin EC tablet 81 mg, 81 mg, Oral, BID, Ulyses Amor, PA-C, 81 mg at 02/10/16 1104 . atorvastatin (LIPITOR) tablet 40 mg, 40 mg, Oral, QHS, Ulyses Amor, PA-C, 40 mg at 02/09/16 2140 . docusate sodium (COLACE) capsule 100 mg, 100 mg, Oral, Once per day on Mon Wed Fri, Emma M Collins, PA-C, 100 mg at 02/10/16 0174 . enoxaparin (LOVENOX) injection 30 mg, 30 mg, Subcutaneous, Q24H, Ulyses Amor, PA-C, 30 mg at 02/10/16 9449 . ferrous sulfate (FER-IN-SOL) 75 (15 Fe) MG/ML solution 157.5 mg, 157.5 mg, Oral, BID, Emma M Collins, PA-C, 157.5 mg at 02/10/16 1102 . furosemide (LASIX) tablet 80 mg, 80 mg, Oral, Daily, Ulyses Amor, PA-C, 80 mg at 02/10/16 1105 . glipiZIDE (GLUCOTROL) tablet 10 mg, 10 mg, Oral, BID, Ulyses Amor, PA-C, 10 mg at 02/10/16 1104 . guaiFENesin-dextromethorphan (ROBITUSSIN DM) 100-10 MG/5ML syrup 15 mL, 15 mL, Oral, Q4H PRN, Ulyses Amor, PA-C . hydrALAZINE (APRESOLINE) injection 5 mg, 5 mg, Intravenous, Q20 Min PRN, Ulyses Amor, PA-C . hydrALAZINE (APRESOLINE) tablet 75 mg, 75 mg, Oral, Daily, Wynell Balloon, RPH, 75 mg at 02/10/16 1103 . insulin aspart (novoLOG) injection 0-9 Units, 0-9 Units, Subcutaneous, TID WC, Ulyses Amor, PA-C, 1 Units at 02/10/16 1200 . insulin detemir (LEVEMIR) injection 20 Units, 20 Units, Subcutaneous, QHS, Ulyses Amor, PA-C, 20 Units at 02/09/16 2140 . isosorbide mononitrate (IMDUR) 24 hr tablet 60 mg, 60 mg, Oral, Daily, Ulyses Amor, PA-C, 60 mg at 02/10/16 1104 . labetalol (NORMODYNE,TRANDATE) injection 10 mg, 10 mg, Intravenous, Q10 min PRN, Ulyses Amor, PA-C, 10 mg at 02/09/16 0458 . magnesium sulfate IVPB 2 g 50 mL, 2 g, Intravenous, Daily PRN, Ulyses Amor, PA-C . methocarbamol (ROBAXIN) 500 mg in dextrose 5 %  50 mL IVPB, 500 mg, Intravenous, Q8H, Emma M Collins, PA-C, 500 mg at 02/10/16 1358 . metoprolol (LOPRESSOR) injection 2-5 mg, 2-5 mg, Intravenous, Q2H PRN, Ulyses Amor, PA-C . morphine 2 MG/ML injection 1-2 mg, 1-2 mg, Intravenous, Q1H PRN, Ulyses Amor, PA-C, 2 mg at 02/09/16 1747 . multivitamin with minerals tablet 1 tablet, 1 tablet, Oral, Daily, Ulyses Amor, PA-C, 1 tablet at 02/10/16 1104 . ondansetron (ZOFRAN) injection 4 mg, 4 mg, Intravenous, Q6H PRN, Ulyses Amor, PA-C . oxyCODONE-acetaminophen (PERCOCET/ROXICET) 5-325 MG per tablet 1-2 tablet, 1-2 tablet, Oral, Q4H PRN, Ulyses Amor, PA-C, 2 tablet at 02/09/16 1423 . pantoprazole (PROTONIX) EC tablet 40 mg, 40 mg, Oral, Daily, Ulyses Amor, PA-C, 40 mg at 02/10/16 1102 . phenol (CHLORASEPTIC) mouth spray 1 spray, 1 spray, Mouth/Throat, PRN, Ulyses Amor, PA-C . potassium chloride SA (K-DUR,KLOR-CON) CR tablet 20-40 mEq, 20-40 mEq, Oral, Daily PRN, Ulyses Amor, PA-C . tamsulosin [facility]) capsule 0.4 mg, 0.4 mg, Oral, QPC supper, Emma M Collins, PA-C, 0.4 mg at 02/09/16 1800  Patients Current Diet: Diet Carb Modified Fluid consistency:: Thin; Room service appropriate?: Yes  Precautions / Restrictions Precautions Precautions: Fall Restrictions Weight Bearing Restrictions: Yes (left AKA)   Has the patient had 2 or more falls or a fall with injury in the past year?No  Prior Activity Level Community (5-7x/wk): Mod I with straight cane up unitl 2 weeks pta. Very active and independnent in the community. 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 Outings daily  Development worker, international aid / Equipment Home Assistive Devices/Equipment: Wheelchair, Hearing aid Home Equipment:  Walker - 2 wheels, Walker - 4 wheels, Cane - single point, Bedside commode, Tub bench, Wheelchair - manual  Prior Device Use: Indicate devices/aids used by the patient prior to current illness, exacerbation or injury? used straight cane until 2 weeks pta.  then was using rollator RW with seat to mobilize  Prior Functional Level Prior Function Level of Independence: Needs assistance Gait / Transfers Assistance Needed: Assist with transfers and very little amb in last 2 weeks. Sitting on rollator and family pushing him around. Until 2 weeks ago was amb with occasional use of cane. ADL's / Homemaking Assistance Needed: Assist for all ADLs in past 1-2 weeks, but prior to that was independent  Self Care: Did the patient need help bathing, dressing, using the toilet or eating? Independent  Indoor Mobility: Did the patient need assistance with walking from room to room (with or without device)? Independent  Stairs: Did the patient need assistance with internal or external stairs (with or without device)? Independent  Functional Cognition: Did the patient need help planning regular tasks such as shopping or remembering to take medications? Independent  Current Functional Level Cognition  Overall Cognitive Status: Within Functional Limits for tasks assessed Orientation Level: Oriented X4   Extremity Assessment (includes Sensation/Coordination)  Upper Extremity Assessment: Overall WFL for tasks assessed (overall 4/5 strength)  Lower Extremity Assessment: RLE deficits/detail, LLE deficits/detail RLE Deficits / Details: grossly 3+/5 LLE Deficits / Details: AKA    ADLs  Overall ADL's : Needs assistance/impaired Eating/Feeding: Set up, Sitting Grooming: Wash/dry hands, Wash/dry face, Set up, Sitting Upper Body Bathing: Set up, Sitting Lower Body Bathing: Moderate assistance, +2 for physical assistance, Sit to/from stand Upper Body Dressing : Set up, Sitting Lower Body Dressing: Moderate assistance, +2 for physical assistance, Sit to/from stand Toilet Transfer: Moderate assistance, +2 for physical assistance, Cueing for safety, BSC (stedy) Toileting- Clothing Manipulation and Hygiene: Moderate assistance, +2 for physical assistance, Sit  to/from stand Functional mobility during ADLs: Moderate assistance, +2 for physical assistance (stedy)    Mobility  Overal bed mobility: Needs Assistance, +2 for physical assistance Bed Mobility: Rolling, Sidelying to Sit Rolling: Mod assist Sidelying to sit: Mod assist, +2 for physical assistance, HOB elevated General bed mobility comments: Mod +2 assist for trunk support to come to sitting and to pivot and scoot hips to EOB. VCs for hand placement.    Transfers  Overall transfer level: Needs assistance Equipment used: Ambulation equipment used Transfer via Lift Equipment: Stedy Transfers: Sit to/from Stand, Risk manager Sit to Stand: Mod assist, +2 physical assistance Stand pivot transfers: Mod assist, +2 physical assistance General transfer comment: Mod +2 assist for boost to stand and to maintain balance. VCs for hand placement and safety. Pt with R lateral lean while using stedy and required min assist to maintain midline position.    Ambulation / Gait / Stairs / Office manager / Balance Dynamic Sitting Balance Sitting balance - Comments: Needs UE support or assist to maintain static sitting  Balance Overall balance assessment: Needs assistance Sitting-balance support: No upper extremity supported, Feet supported Sitting balance-Leahy Scale: Poor Sitting balance - Comments: Needs UE support or assist to maintain static sitting  Postural control: Posterior lean Standing balance support: Bilateral upper extremity supported, During functional activity Standing balance-Leahy Scale: Poor Standing balance comment: UE support on Steady and mod assist for standing balance.     Special needs/care consideration Skin surgical incision site. Multiple echymotic areas to arms Bowel  mgmt: continent Bladder mgmt: continent    Previous Home Environment Living Arrangements: Spouse/significant other Lives With: Spouse Available Help at  Discharge: Family, Available 24 hours/day Type of Home: House Home Layout: One level Home Access: Stairs to enter Entrance Stairs-Rails: Right, Left Entrance Stairs-Number of Steps: 3 Bathroom Shower/Tub: Tub/shower unit, Curtain (has bar on side of tub and on wall in tub) Bathroom Toilet: Standard Bathroom Accessibility: Yes How Accessible: Accessible via walker [place]: No Additional Comments: was using rollator RW with seat two weeks pta  Discharge Living Setting Plans for Discharge Living Setting: Patient's home, Lives with (comment) (wife) Type of Home at Discharge: House Discharge Home Layout: One level Discharge Home Access: Stairs to enter Entrance Stairs-Rails: Right, Left, Can reach both Entrance Stairs-Number of Steps: 3 steps into home, one step from the sidewalk Discharge Bathroom Shower/Tub: Tub/shower unit, Curtain (has rail on side of tub and on the wall in the tub) Discharge Bathroom Toilet: Standard Discharge Bathroom Accessibility: Yes How Accessible: Accessible via walker Does the patient have any problems obtaining your medications?: No  Social/Family/Support Systems Patient Roles: Spouse, Parent, Psychologist, occupational (still teaches [employer] and very active in [place]. [employer]) Contact Information: Lannie Fields, wife Anticipated Caregiver: wife and daughters Anticipated Caregiver's Contact Information: see above Ability/Limitations of Caregiver: wife works days. [name] daughter, Chrystal from [place] will be avaia[place] for the summer Caregiver Availability: 24/7 Discharge Plan Discussed with Primary Caregiver: Yes Is Caregiver In Agreement with Plan?: Yes Does Caregiver/Family have Issues with Lodging/Transportation while Pt is in Rehab?: No  Goals/Additional Needs Patient/Family Goal for Rehab: MOd I to supervision with PT and supervision to min assist with OT Expected length of stay: ELOS 10-12 days Pt/Family Agrees to Admission and willing to participate:  Yes Program Orientation Provided & Reviewed with Pt/Caregiver Including Roles & Responsibilities: Yes   Decrease burden of Care through IP rehab admission: n/a  Possible need for SNF placement upon discharge: not anticipated  Patient Condition: This patient's medical and functional status has changed since the consult dated _6/27/2017 in which the Rehabilitation Physician determined and documented that the patient was potentially appropriate for intensive rehabilitative care in an inpatient rehabilitation facility. Issues have been addressed for pt did well with therapy evaluations and family can provide 24/7 caregiver support at discharge and update has been discussed with Dr. Posey Pronto and patient now appropriate for inpatient rehabilitation. Will admit to inpatient rehab today.   Preadmission Screen Completed By: Cleatrice Burke, 02/10/2016 2:35 PM ______________________________________________________________________  Discussed status with Dr. Posey Pronto on 02/10/2016 at 1434 and received telephone approval for admission today.  Admission Coordinator: Cleatrice Burke, time 2956 Date 02/10/2016          Cosigned by: Ankit Lorie Phenix, MD at 02/10/2016 2:52 PM  Revision History     Date/Time User Provider Type Action   02/10/2016 2:52 PM Ankit Lorie Phenix, MD Physician Cosign   02/10/2016 2:35 PM Collin Gong, RN Rehab Admission Coordinator Sign

## 2016-02-10 NOTE — PMR Pre-admission (Signed)
PMR Admission Coordinator Pre-Admission Assessment  Patient: Collin Pearson is an 82 y.o., male MRN: [id number] DOB: 1927-02-09 Height:   Weight: 58.015 kg (127 lb 14.4 oz)              Insurance Information HMO:     PPO: yes     PCP:      IPA:      80/20:      OTHER:  PRIMARY: [employer] [place]      Policy#: [id number]      Subscriber: wife CM Name: Carmelia Bake      Phone#: [phone]     Fax#: [phone] Pre-Cert#: [id number] approved 6/28 through 02/17/16      Employer: wife's employer Benefits:  Phone #: [phone]     Name: 02/10/16 Eff. Date: 08/16/15     Deduct: $650 met      Out of Pocket Max: $2650 met      Life Max: none CIR: 80%      SNF: 80% 120 days per benefit period Outpatient: 80%     Co-Pay:  30 visits each Home Health: 80%      Co-Pay: 120 visits combined DME: 80%     Co-Pay: 20% Providers: in network  SECONDARY: Medicare part A only      Policy#: [id number]      Subscriber: pt   Medicaid Application Date:       Case Manager:  Disability Application Date:       Case Worker:   Emergency Facilities manager Information    Name Relation Home Work [place] [phone]  [phone]   [place] Daughter [phone]  [phone]   Lampley,Imelda Daughter   [phone]     Current Medical History  Patient Admitting Diagnosis: Left AKA  History of Present Illness: Collin Pearson is a 82 y.o. Male with history of HTN, DM, PAF, CAD with AS s/p CABG with AVR, CKD stage IV, PAD s/p B-carotid stents and history of calcified vessels BLE. He developed gangrenous changes left foot with severe erythema and skin breakdown of pretibial tissue with severe pain X 1 month and AKA recommended due to inability to salvage limb. He was admitted on 06/26 for L-AKA by Dr. Donnetta Hutching. Post op with phantom pain as well as muscle spasms. Has been seen by [facility] for prosthetic consult.  Past Medical History  Past Medical History  Diagnosis Date   . Clotting disorder ([place])   . Coronary artery disease   . AS (aortic stenosis)     status post aortic valve replacement with an [employer] model 36 T. fracture bioprosthesis  . Anginal pain ([place])   . Hypertension   . Diabetes mellitus   . Peripheral vascular disease ([place])   . GERD (gastroesophageal reflux disease)   . CKD (chronic kidney disease) stage 4, GFR 15-29 ml/min (H[facility])   . PVD (peripheral vascular disease), with hsx of bilateral carotid endarterectomies at [facility], and bilateral carotid stenting 05/18/2012  . Heart murmur   . PAF (paroxysmal atrial fibrillation), s/p cabg 06/18/2012  . Critical lower limb ischemia   . Arthritis   . Anxiety     Family History  family history includes CAD in his brother; Heart disease in his maternal uncle and paternal uncle; Heart disease (age of onset: 21) in his mother; Heart disease (age of onset: 31) in his father and maternal grandfather.  Prior Rehab/Hospitalizations:  Has the patient had major surgery during 100 days prior to  admission? No  Current Medications   Current facility-administered medications:  .  0.9 %  sodium chloride infusion, , Intravenous, Continuous, Ulyses Amor, PA-C, Last Rate: 100 mL/hr at 02/08/16 2309 .  acetaminophen (TYLENOL) tablet 325-650 mg, 325-650 mg, Oral, Q4H PRN **OR** acetaminophen (TYLENOL) suppository 325-650 mg, 325-650 mg, Rectal, Q4H PRN, Ulyses Amor, PA-C .  alum & mag hydroxide-simeth (MAALOX/MYLANTA) 200-200-20 MG/5ML suspension 15-30 mL, 15-30 mL, Oral, Q2H PRN, Ulyses Amor, PA-C .  amLODipine (NORVASC) tablet 10 mg, 10 mg, Oral, Daily, Emma M Collins, PA-C, 10 mg at 02/10/16 1103 .  aspirin EC tablet 81 mg, 81 mg, Oral, BID, Ulyses Amor, PA-C, 81 mg at 02/10/16 1104 .  atorvastatin (LIPITOR) tablet 40 mg, 40 mg, Oral, QHS, Ulyses Amor, PA-C, 40 mg at 02/09/16 2140 .  docusate sodium (COLACE) capsule 100 mg, 100 mg, Oral, Once per day on Mon Wed Fri, Emma M Collins,  PA-C, 100 mg at 02/10/16 2951 .  enoxaparin (LOVENOX) injection 30 mg, 30 mg, Subcutaneous, Q24H, Ulyses Amor, PA-C, 30 mg at 02/10/16 8841 .  ferrous sulfate (FER-IN-SOL) 75 (15 Fe) MG/ML solution 157.5 mg, 157.5 mg, Oral, BID, Emma M Collins, PA-C, 157.5 mg at 02/10/16 1102 .  furosemide (LASIX) tablet 80 mg, 80 mg, Oral, Daily, Ulyses Amor, PA-C, 80 mg at 02/10/16 1105 .  glipiZIDE (GLUCOTROL) tablet 10 mg, 10 mg, Oral, BID, Ulyses Amor, PA-C, 10 mg at 02/10/16 1104 .  guaiFENesin-dextromethorphan (ROBITUSSIN DM) 100-10 MG/5ML syrup 15 mL, 15 mL, Oral, Q4H PRN, Ulyses Amor, PA-C .  hydrALAZINE (APRESOLINE) injection 5 mg, 5 mg, Intravenous, Q20 Min PRN, Ulyses Amor, PA-C .  hydrALAZINE (APRESOLINE) tablet 75 mg, 75 mg, Oral, Daily, Wynell Balloon, RPH, 75 mg at 02/10/16 1103 .  insulin aspart (novoLOG) injection 0-9 Units, 0-9 Units, Subcutaneous, TID WC, Ulyses Amor, PA-C, 1 Units at 02/10/16 1200 .  insulin detemir (LEVEMIR) injection 20 Units, 20 Units, Subcutaneous, QHS, Ulyses Amor, PA-C, 20 Units at 02/09/16 2140 .  isosorbide mononitrate (IMDUR) 24 hr tablet 60 mg, 60 mg, Oral, Daily, Ulyses Amor, PA-C, 60 mg at 02/10/16 1104 .  labetalol (NORMODYNE,TRANDATE) injection 10 mg, 10 mg, Intravenous, Q10 min PRN, Ulyses Amor, PA-C, 10 mg at 02/09/16 0458 .  magnesium sulfate IVPB 2 g 50 mL, 2 g, Intravenous, Daily PRN, Ulyses Amor, PA-C .  methocarbamol (ROBAXIN) 500 mg in dextrose 5 % 50 mL IVPB, 500 mg, Intravenous, Q8H, Emma M Collins, PA-C, 500 mg at 02/10/16 1358 .  metoprolol (LOPRESSOR) injection 2-5 mg, 2-5 mg, Intravenous, Q2H PRN, Ulyses Amor, PA-C .  morphine 2 MG/ML injection 1-2 mg, 1-2 mg, Intravenous, Q1H PRN, Ulyses Amor, PA-C, 2 mg at 02/09/16 1747 .  multivitamin with minerals tablet 1 tablet, 1 tablet, Oral, Daily, Ulyses Amor, PA-C, 1 tablet at 02/10/16 1104 .  ondansetron (ZOFRAN) injection 4 mg, 4 mg, Intravenous, Q6H PRN, Ulyses Amor, PA-C .  oxyCODONE-acetaminophen (PERCOCET/ROXICET) 5-325 MG per tablet 1-2 tablet, 1-2 tablet, Oral, Q4H PRN, Ulyses Amor, PA-C, 2 tablet at 02/09/16 1423 .  pantoprazole (PROTONIX) EC tablet 40 mg, 40 mg, Oral, Daily, Ulyses Amor, PA-C, 40 mg at 02/10/16 1102 .  phenol (CHLORASEPTIC) mouth spray 1 spray, 1 spray, Mouth/Throat, PRN, Ulyses Amor, PA-C .  potassium chloride SA (K-DUR,KLOR-CON) CR tablet 20-40 mEq, 20-40 mEq, Oral, Daily PRN, Ulyses Amor, PA-C .  tamsulosin (FLOMAX) capsule 0.4 mg, 0.4 mg, Oral, QPC supper, Emma M Collins, PA-C, 0.4 mg at 02/09/16 1800  Patients Current Diet: Diet Carb Modified Fluid consistency:: Thin; Room service appropriate?: Yes  Precautions / Restrictions Precautions Precautions: Fall Restrictions Weight Bearing Restrictions: Yes (left AKA)   Has the patient had 2 or more falls or a fall with injury in the past year?No  Prior Activity Level Community (5-7x/wk): Mod I with straight cane up unitl 2 weeks pta. Very active and independnent in the community. 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 Outings daily  Development worker, international aid / Equipment Home Assistive Devices/Equipment: Wheelchair, Hearing aid Home Equipment: Environmental consultant - 2 wheels, Environmental consultant - 4 wheels, [place] - single point, Bedside commode, Tub bench, Wheelchair - manual  Prior Device Use: Indicate devices/aids used by the patient prior to current illness, exacerbation or injury? used straight cane until 2 weeks pta. then was using rollator RW with seat to mobilize  Prior Functional Level Prior Function Level of Independence: Needs assistance Gait / Transfers Assistance Needed: Assist with transfers and very little amb in last 2 weeks. Sitting on rollator and family pushing him around. Until 2 weeks ago was amb with occasional use of cane. ADL's / Homemaking Assistance Needed: Assist for all ADLs in past 1-2 weeks, but prior to that was independent  Self Care: Did the patient need help  bathing, dressing, using the toilet or eating?  Independent  Indoor Mobility: Did the patient need assistance with walking from room to room (with or without device)? Independent  Stairs: Did the patient need assistance with internal or external stairs (with or without device)? Independent  Functional Cognition: Did the patient need help planning regular tasks such as shopping or remembering to take medications? Independent  Current Functional Level Cognition  Overall Cognitive Status: Within Functional Limits for tasks assessed Orientation Level: Oriented X4    Extremity Assessment (includes Sensation/Coordination)  Upper Extremity Assessment: Overall WFL for tasks assessed (overall 4/5 strength)  Lower Extremity Assessment: RLE deficits/detail, LLE deficits/detail RLE Deficits / Details: grossly 3+/5 LLE Deficits / Details: AKA    ADLs  Overall ADL's : Needs assistance/impaired Eating/Feeding: Set up, Sitting Grooming: Wash/dry hands, Wash/dry face, Set up, Sitting Upper Body Bathing: Set up, Sitting Lower Body Bathing: Moderate assistance, +2 for physical assistance, Sit to/from stand Upper Body Dressing : Set up, Sitting Lower Body Dressing: Moderate assistance, +2 for physical assistance, Sit to/from stand Toilet Transfer: Moderate assistance, +2 for physical assistance, Cueing for safety, BSC (stedy) Toileting- Clothing Manipulation and Hygiene: Moderate assistance, +2 for physical assistance, Sit to/from stand Functional mobility during ADLs: Moderate assistance, +2 for physical assistance (stedy)    Mobility  Overal bed mobility: Needs Assistance, +2 for physical assistance Bed Mobility: Rolling, Sidelying to Sit Rolling: Mod assist Sidelying to sit: Mod assist, +2 for physical assistance, HOB elevated General bed mobility comments: Mod +2 assist for trunk support to come to sitting and to pivot and scoot hips to EOB. VCs for hand placement.    Transfers  Overall  transfer level: Needs assistance Equipment used: Ambulation equipment used Transfer via Lift Equipment: Stedy Transfers: Sit to/from Stand, Risk manager Sit to Stand: Mod assist, +2 physical assistance Stand pivot transfers: Mod assist, +2 physical assistance General transfer comment: Mod +2 assist for boost to stand and to maintain balance. VCs for hand placement and safety. Pt with R lateral lean while using stedy and required min assist to maintain midline position.    Ambulation / Gait /  Stairs / Office manager / Balance Dynamic Sitting Balance Sitting balance - Comments: Needs UE support or assist to maintain static sitting  Balance Overall balance assessment: Needs assistance Sitting-balance support: No upper extremity supported, Feet supported Sitting balance-Leahy Scale: Poor Sitting balance - Comments: Needs UE support or assist to maintain static sitting  Postural control: Posterior lean Standing balance support: Bilateral upper extremity supported, During functional activity Standing balance-Leahy Scale: Poor Standing balance comment: UE support on Steady and mod assist for standing balance.     Special needs/care consideration Skin surgical incision site. Multiple echymotic areas to arms Bowel mgmt: continent Bladder mgmt: continent    Previous Home Environment Living Arrangements: Spouse/significant other  Lives With: Spouse Available Help at Discharge: Family, Available 24 hours/day Type of Home: House Home Layout: One level Home Access: Stairs to enter Entrance Stairs-Rails: Right, Left Entrance Stairs-Number of Steps: 3 Bathroom Shower/Tub: Tub/shower unit, Curtain (has bar on side of tub and on wall in tub) Bathroom Toilet: Standard Bathroom Accessibility: Yes How Accessible: Accessible via walker [place]: No Additional Comments: was using rollator RW with seat two weeks pta  Discharge Living Setting Plans for  Discharge Living Setting: Patient's home, Lives with (comment) (wife) Type of Home at Discharge: House Discharge Home Layout: One level Discharge Home Access: Stairs to enter Entrance Stairs-Rails: Right, Left, Can reach both Entrance Stairs-Number of Steps: 3 steps into home, one step from the sidewalk Discharge Bathroom Shower/Tub: Tub/shower unit, Curtain (has rail on side of tub and on the wall in the tub) Discharge Bathroom Toilet: Standard Discharge Bathroom Accessibility: Yes How Accessible: Accessible via walker Does the patient have any problems obtaining your medications?: No  Social/Family/Support Systems Patient Roles: Spouse, Parent, Psychologist, occupational (still teaches [employer] and very active in [place]) Contact Information: Lannie Fields, wife Anticipated Caregiver: wife and daughters Anticipated Caregiver's Contact Information: see above Ability/Limitations of Caregiver: wife works days. [name] daughter, Chrystal from [place] will be avaia[place] for the summer Caregiver Availability: 24/7 Discharge Plan Discussed with Primary Caregiver: Yes Is Caregiver In Agreement with Plan?: Yes Does Caregiver/Family have Issues with Lodging/Transportation while Pt is in Rehab?: No  Goals/Additional Needs Patient/Family Goal for Rehab: MOd I to supervision with PT and supervision to min assist with OT Expected length of stay: ELOS 10-12 days Pt/Family Agrees to Admission and willing to participate: Yes Program Orientation Provided & Reviewed with Pt/Caregiver Including Roles  & Responsibilities: Yes   Decrease burden of Care through IP rehab admission: n/a  Possible need for SNF placement upon discharge: not anticipated  Patient Condition: This patient's medical and functional status has changed since the consult dated _6/27/2017 in which the Rehabilitation Physician determined and documented that the patient was potentially appropriate for intensive rehabilitative care in an inpatient  rehabilitation facility. Issues have been addressed for pt did well with therapy evaluations and family can provide 24/7 caregiver support at discharge and update has been discussed with Dr. Posey Pronto and patient now appropriate for inpatient rehabilitation. Will admit to inpatient rehab today.   Preadmission Screen Completed By:  Cleatrice Burke, 02/10/2016 2:35 PM ______________________________________________________________________   Discussed status with Dr. Posey Pronto on 02/10/2016 at 1434 and received telephone approval for admission today.  Admission Coordinator:  Cleatrice Burke, time 2751 Date 02/10/2016

## 2016-02-10 NOTE — H&P (Signed)
Physical Medicine and Rehabilitation Admission H&P    CC: L-AKA    HPI:  Collin Pearson is a 82 y.o. Male with history of HTN, DM, PAF, CAD with AS s/p CABG with AVR, CKD stage IV, PAD s/p B-carotid stents and history of calcified vessels BLE. He developed gangrenous changes left foot with severe erythema and skin breakdown of pretibial tissue with severe pain X 1 month and AKA recommended due to inability to salvage limb. He was admitted on 06/26 for L-AKA by Dr. Donnetta Hutching. Post op with phantom pain as well as muscle spasms. Therapy ongoing and CIR recommended for follow up therapy.    Has been seen by [employer] for prosthetic consult and retention sock ordered.   Review of Systems  HENT: Negative for hearing loss.   Eyes: Negative for blurred vision and double vision.  Respiratory: Negative for cough, shortness of breath and stridor.   Cardiovascular: Negative for chest pain, palpitations and leg swelling.  Gastrointestinal: Negative for heartburn, nausea and constipation.  Genitourinary: Negative for dysuria and urgency.  Musculoskeletal: Positive for myalgias (right heel pain) and falls. Negative for back pain and joint pain.  Skin: Negative for itching and rash.  Neurological: Positive for sensory change and weakness. Negative for dizziness and headaches.  Psychiatric/Behavioral: The patient does not have insomnia.   All other systems reviewed and are negative.  Past Medical History  Diagnosis Date  . Clotting disorder ([place])   . Coronary artery disease   . AS (aortic stenosis)     status post aortic valve replacement with an [employer] model 33 T. fracture bioprosthesis  . Anginal pain ([place])   . Hypertension   . Diabetes mellitus   . Peripheral vascular disease ([place])   . GERD (gastroesophageal reflux disease)   . CKD (chronic kidney disease) stage 4, GFR 15-29 ml/min (HCC)   . PVD (peripheral vascular disease), with hsx of bilateral carotid endarterectomies at  [facility], and bilateral carotid stenting 05/18/2012  . Heart murmur   . PAF (paroxysmal atrial fibrillation), s/p cabg 06/18/2012  . Critical lower limb ischemia   . Arthritis   . Anxiety      Past Surgical History  Procedure Laterality Date  . Endarterectomy    . Prostate surgery    . Cardiac stents    . Cardiac catheterization    . Coronary artery bypass graft  06/13/2012    Procedure: CORONARY ARTERY BYPASS GRAFTING (CABG);  Surgeon: Grace Isaac, MD;  Location: [place];  Service: Open Heart Surgery;  Laterality: N/A;  must start at 44 - appointment at 0800  . Aortic valve replacement  06/13/2012    Procedure: AORTIC VALVE REPLACEMENT (AVR);  Surgeon: Grace Isaac, MD;  Location: [place];  Service: Open Heart Surgery;  Laterality: N/A;  . Cardiac stress test  06/03/2010    Mild anterolateral ischemis  . Left and right heart catheterization with coronary angiogram N/A 05/18/2012    Procedure: LEFT AND RIGHT HEART CATHETERIZATION WITH CORONARY ANGIOGRAM;  Surgeon: Lorretta Harp, MD;  Location: [facility] CATH LAB;  Service: Cardiovascular;  Laterality: N/A;  . Carotid angiogram N/A 05/18/2012    Procedure: CAROTID ANGIOGRAM;  Surgeon: Lorretta Harp, MD;  Location: [facility] CATH LAB;  Service: Cardiovascular;  Laterality: N/A;  . Cardiac valve replacement    . Above knee leg amputation Left 02/08/2016  . Amputation Left 02/08/2016    Procedure: AMPUTATION ABOVE KNEE;  Surgeon: Rosetta Posner, MD;  Location:  [facility] OR;  Service: Vascular;  Laterality: Left;    Family History  Problem Relation Age of Onset  . Heart disease Mother 14  . Heart disease Father 48  . CAD Brother   . Heart disease Maternal Uncle   . Heart disease Paternal Uncle   . Heart disease Maternal Grandfather 68    Social History:   Married. 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 Wife works. Was ambulating with cane when out of home. Has been pushed around on rollater for past few weeks. Per  reports that he has never  smoked. He has never used smokeless tobacco. He reports that he does not drink alcohol or use illicit drugs.   Allergies: No Known Allergies    Medications Prior to Admission  Medication Sig Dispense Refill  . amLODipine (NORVASC) 10 MG tablet TAKE 1 TABLET DAILY 90 tablet 2  . aspirin EC 81 MG tablet Take 81 mg by mouth 2 (two) times daily.    Marland Kitchen atorvastatin (LIPITOR) 40 MG tablet TAKE 1 TABLET DAILY AT BEDTIME 90 tablet 1  . BD INSULIN SYRINGE ULTRAFINE 31G X 5/16" 1 ML MISC Reported on 02/02/2016    . clotrimazole-betamethasone (LOTRISONE) cream Apply 1 application topically daily as needed (dry skin, itching). Reported on 02/02/2016    . docusate sodium (STOOL SOFTENER) 100 MG capsule Take 100 mg by mouth 3 (three) times a week. Reported on 02/02/2016    . Epoetin Alfa (PROCRIT IJ) Inject 1 Dose as directed as needed. Reported on 02/02/2016    . fish oil-omega-3 fatty acids 1000 MG capsule Take 1 g by mouth 3 (three) times daily. Reported on 02/02/2016    . furosemide (LASIX) 80 MG tablet Take 1 tablet (80 mg total) by mouth daily. Take additional '40mg'$  every other day in the evening if weight goes above 134lbs 135 tablet 3  . glipiZIDE (GLUCOTROL) 10 MG tablet Take 10 mg by mouth 2 (two) times daily. Reported on 02/02/2016    . hydrALAZINE (APRESOLINE) 25 MG tablet Take 3 tablets (75 mg total) by mouth 3 (three) times daily. (Patient taking differently: Take 75 mg by mouth daily. ) 60 tablet 3  . Iron TABS Take 160 mg by mouth 2 (two) times daily.     . isosorbide mononitrate (IMDUR) 60 MG 24 hr tablet Take 1 tablet (60 mg total) by mouth daily. 30 tablet 1  . LEVEMIR 100 UNIT/ML injection Inject 20 Units into the skin at bedtime.    . Multiple Vitamin (MULTIVITAMIN WITH MINERALS) TABS Take 1 tablet by mouth daily.    . ONE TOUCH ULTRA TEST test strip Reported on 02/02/2016    . oxyCODONE-acetaminophen (PERCOCET) 10-325 MG tablet Take 1 tablet by mouth every 4 (four) hours as needed for pain.      . pantoprazole (PROTONIX) 40 MG tablet Take 40 mg by mouth daily.    . silver sulfADIAZINE (SILVADENE) 1 % cream Apply 1 application topically as needed (dry skin). Reported on 02/02/2016    . sulfamethoxazole-trimethoprim (BACTRIM DS) 800-160 MG tablet Take 1 tablet by mouth 2 (two) times daily.    . Tamsulosin HCl (FLOMAX) 0.4 MG CAPS Take 0.4 mg by mouth. One tablet in the morning and 1/2 tablet in the evening    . Vitamin D, Ergocalciferol, (DRISDOL) 50000 units CAPS capsule Take 50,000 Units by mouth every 7 (seven) days. Reported on 02/02/2016    . vitamin E (VITAMIN E) 400 UNIT capsule Take 400 Units by mouth daily. Reported on  02/02/2016      Home: Home Living Family/patient expects to be discharged to:: Private residence Living Arrangements: Spouse/significant other Available Help at Discharge: Family, Available 24 hours/day (wife works during the day, daughter may be able to stay) Type of Home: House Home Access: Stairs to enter [employer] of Steps: 3 Entrance Stairs-Rails: Right, Left Home Layout: One level Bathroom Shower/Tub: Chiropodist: [place]: Environmental consultant - 2 wheels, Environmental consultant - 4 wheels, [employer] - single point, Bedside commode, Tub bench, Wheelchair - manual  Lives With: Spouse   Functional History: Prior Function Level of Independence: Needs assistance Gait / Transfers Assistance Needed: Assist with transfers and very little amb in last 2 weeks. Sitting on rollator and family pushing him around. Until 2 weeks ago was amb with occasional use of cane. ADL's / Homemaking Assistance Needed: Assist for all ADLs in past 1-2 weeks, but prior to that was independent  Functional Status:  Mobility: Bed Mobility Overal bed mobility: Needs Assistance, +2 for physical assistance Bed Mobility: Rolling, Sidelying to Sit Rolling: Mod assist Sidelying to sit: Mod assist, +2 for physical assistance, HOB elevated General bed mobility comments:  Mod +2 assist for trunk support to come to sitting and to pivot and scoot hips to EOB. VCs for hand placement. Transfers Overall transfer level: Needs assistance Equipment used: Ambulation equipment used Transfer via Lift Equipment: Stedy Transfers: Sit to/from Stand, Risk manager Sit to Stand: Mod assist, +2 physical assistance Stand pivot transfers: Mod assist, +2 physical assistance General transfer comment: Mod +2 assist for boost to stand and to maintain balance. VCs for hand placement and safety. Pt with R lateral lean while using stedy and required min assist to maintain midline position.      ADL: ADL Overall ADL's : Needs assistance/impaired Eating/Feeding: Set up, Sitting Grooming: Wash/dry hands, Wash/dry face, Set up, Sitting Upper Body Bathing: Set up, Sitting Lower Body Bathing: Moderate assistance, +2 for physical assistance, Sit to/from stand Upper Body Dressing : Set up, Sitting Lower Body Dressing: Moderate assistance, +2 for physical assistance, Sit to/from stand Toilet Transfer: Moderate assistance, +2 for physical assistance, Cueing for safety, BSC (stedy) Toileting- Clothing Manipulation and Hygiene: Moderate assistance, +2 for physical assistance, Sit to/from stand Functional mobility during ADLs: Moderate assistance, +2 for physical assistance (stedy)  Cognition: Cognition Overall Cognitive Status: Within Functional Limits for tasks assessed Orientation Level: Oriented X4 Cognition Arousal/Alertness: Awake/alert Behavior During Therapy: WFL for tasks assessed/performed Overall Cognitive Status: Within Functional Limits for tasks assessed   Blood pressure 169/59, pulse 83, temperature 97.8 F (36.6 C), temperature source Oral, resp. rate 18, weight 58.015 kg (127 lb 14.4 oz), SpO2 97 %. Physical Exam  Nursing note and vitals reviewed. Constitutional: He is oriented to person, place, and time. He appears well-developed. He is easily aroused.    Frail appearing male. Lethargic-- reported to have pain pill recently.   HENT:  Head: Normocephalic and atraumatic.  Mouth/Throat: Oropharynx is clear and moist. No oropharyngeal exudate.  Eyes: Conjunctivae and EOM are normal. Pupils are equal, round, and reactive to light. No scleral icterus.  Neck: Normal range of motion. Neck supple.  Cardiovascular: Normal rate and regular rhythm.   Occasional extrasystoles are present.  Murmur heard. Respiratory: Breath sounds normal. No stridor. He has no wheezes.  GI: Soft. Bowel sounds are normal. He exhibits no distension. There is no tenderness.  Musculoskeletal: He exhibits tenderness (L-AKA sensitive to touch. ). He exhibits no edema.  Moderate serosanguineous drainage on  L-AKA dressing.  Right heel red and boggy-- tender to touch.   Neurological: He is oriented to person, place, and time and easily aroused.  Motor: B/l UE, RLE 5/5 proximal to distal LLE: Hip flexion 5/5  Skin: Skin is warm and dry.  Surgical amputation site with VAC.  Some ischemic changes to right lower extremity   Psychiatric: He has a normal mood and affect. His behavior is normal.    Results for orders placed or performed during the hospital encounter of 02/08/16 (from the past 48 hour(s))  Glucose, capillary     Status: Abnormal   Collection Time: 02/08/16  4:17 PM  Result Value Ref Range   Glucose-Capillary 282 (H) 65 - 99 mg/dL  Glucose, capillary     Status: Abnormal   Collection Time: 02/08/16  9:12 PM  Result Value Ref Range   Glucose-Capillary 132 (H) 65 - 99 mg/dL  Basic metabolic panel     Status: Abnormal   Collection Time: 02/09/16  3:35 AM  Result Value Ref Range   Sodium 140 135 - 145 mmol/L   Potassium 4.3 3.5 - 5.1 mmol/L   Chloride 110 101 - 111 mmol/L   CO2 21 (L) 22 - 32 mmol/L   Glucose, Bld 83 65 - 99 mg/dL   BUN 48 (H) 6 - 20 mg/dL   Creatinine, Ser 2.34 (H) 0.61 - 1.24 mg/dL   Calcium 8.9 8.9 - 10.3 mg/dL   GFR calc non Af Amer 23  (L) >60 mL/min   GFR calc Af Amer 27 (L) >60 mL/min    Comment: (NOTE) The eGFR has been calculated using the CKD EPI equation. This calculation has not been validated in all clinical situations. eGFR's persistently <60 mL/min signify possible Chronic Kidney Disease.    Anion gap 9 5 - 15  CBC     Status: Abnormal   Collection Time: 02/09/16  3:35 AM  Result Value Ref Range   WBC 13.6 (H) 4.0 - 10.5 K/uL   RBC 3.51 (L) 4.22 - 5.81 MIL/uL   Hemoglobin 10.1 (L) 13.0 - 17.0 g/dL   HCT 32.8 (L) 39.0 - 52.0 %   MCV 93.4 78.0 - 100.0 fL   MCH 28.8 26.0 - 34.0 pg   MCHC 30.8 30.0 - 36.0 g/dL   RDW 16.1 (H) 11.5 - 15.5 %   Platelets 273 150 - 400 K/uL  Glucose, capillary     Status: Abnormal   Collection Time: 02/09/16  6:06 AM  Result Value Ref Range   Glucose-Capillary 56 (L) 65 - 99 mg/dL  Glucose, capillary     Status: None   Collection Time: 02/09/16  6:28 AM  Result Value Ref Range   Glucose-Capillary 84 65 - 99 mg/dL  Glucose, capillary     Status: Abnormal   Collection Time: 02/09/16 11:14 AM  Result Value Ref Range   Glucose-Capillary 289 (H) 65 - 99 mg/dL   Comment 1 Notify RN    Comment 2 Document in Chart   Glucose, capillary     Status: Abnormal   Collection Time: 02/09/16  4:26 PM  Result Value Ref Range   Glucose-Capillary 283 (H) 65 - 99 mg/dL   Comment 1 Notify RN    Comment 2 Document in Chart   Glucose, capillary     Status: Abnormal   Collection Time: 02/09/16  9:17 PM  Result Value Ref Range   Glucose-Capillary 117 (H) 65 - 99 mg/dL  Glucose, capillary  Status: Abnormal   Collection Time: 02/10/16  2:13 AM  Result Value Ref Range   Glucose-Capillary 165 (H) 65 - 99 mg/dL  Basic metabolic panel     Status: Abnormal   Collection Time: 02/10/16  4:00 AM  Result Value Ref Range   Sodium 137 135 - 145 mmol/L   Potassium 4.2 3.5 - 5.1 mmol/L   Chloride 107 101 - 111 mmol/L   CO2 20 (L) 22 - 32 mmol/L   Glucose, Bld 118 (H) 65 - 99 mg/dL   BUN 41 (H)  6 - 20 mg/dL   Creatinine, Ser 1.94 (H) 0.61 - 1.24 mg/dL   Calcium 8.6 (L) 8.9 - 10.3 mg/dL   GFR calc non Af Amer 29 (L) >60 mL/min   GFR calc Af Amer 34 (L) >60 mL/min    Comment: (NOTE) The eGFR has been calculated using the CKD EPI equation. This calculation has not been validated in all clinical situations. eGFR's persistently <60 mL/min signify possible Chronic Kidney Disease.    Anion gap 10 5 - 15  CBC     Status: Abnormal   Collection Time: 02/10/16  4:00 AM  Result Value Ref Range   WBC 11.9 (H) 4.0 - 10.5 K/uL   RBC 3.40 (L) 4.22 - 5.81 MIL/uL   Hemoglobin 9.7 (L) 13.0 - 17.0 g/dL   HCT 31.8 (L) 39.0 - 52.0 %   MCV 93.5 78.0 - 100.0 fL   MCH 28.5 26.0 - 34.0 pg   MCHC 30.5 30.0 - 36.0 g/dL   RDW 15.9 (H) 11.5 - 15.5 %   Platelets 271 150 - 400 K/uL  Glucose, capillary     Status: Abnormal   Collection Time: 02/10/16  6:18 AM  Result Value Ref Range   Glucose-Capillary 63 (L) 65 - 99 mg/dL  Glucose, capillary     Status: Abnormal   Collection Time: 02/10/16  7:50 AM  Result Value Ref Range   Glucose-Capillary 101 (H) 65 - 99 mg/dL  Glucose, capillary     Status: Abnormal   Collection Time: 02/10/16 11:31 AM  Result Value Ref Range   Glucose-Capillary 126 (H) 65 - 99 mg/dL   No results found.     Medical Problem List and Plan: 1.  Abnormality of gait secondary to left AKA. 2.  DVT Prophylaxis/Anticoagulation: Pharmaceutical: Lovenox 3. Pain Management: Continue oxycodone prn and robaxin tid. Will add low dose Neurontin to help with neuropathy.  4. Mood: LCSW to follow for evaluation and support.  5. Neuropsych: This patient is capable of making decisions on his own behalf. 6. Skin/Wound Care: Change dressing bid to keep area dry. Will order Prevalon boot for right heel pain.  7. Fluids/Electrolytes/Nutrition: Monitor I/O. Add supplements as intake has been poor.  8. T2DM poor; controlled: Hgb A1c- 8.0.  Monitor BS ac/hs. Continue glucotrol bid and Levemir  daily with SSI for elevated BS.  9. CAD: Monitor for symptoms with activity. On Imdur, ASA and Lipitor.  10. Leucocytosis: Monitor for signs of infection. Encourage IS.  11. Acute on chronic renal failure: Has improved with hydration. BUN/Cr- 69/4.06 at admission.  12. Acute on chronic anemia: Continue iron supplement. Recheck CBC in am.  13. GERD: Symptoms controlled on protonix.  14. HTN: Monitor BP bid. Continue Norvasc,  Furosemide, and Hydralazine.    Post Admission Physician Evaluation: 1. Functional deficits secondary  to left AKA. 2. Patient is admitted to receive collaborative, interdisciplinary care between the physiatrist, rehab nursing staff, and therapy  team. 3. Patient's level of medical complexity and substantial therapy needs in context of that medical necessity cannot be provided at a lesser intensity of care such as a SNF. 4. Patient has experienced substantial functional loss from his/her baseline which was documented above under the "Functional History" and "Functional Status" headings.  Judging by the patient's diagnosis, physical exam, and functional history, the patient has potential for functional progress which will result in measurable gains while on inpatient rehab.  These gains will be of substantial and practical use upon discharge  in facilitating mobility and self-care at the household level. 5. Physiatrist will provide 24 hour management of medical needs as well as oversight of the therapy plan/treatment and provide guidance as appropriate regarding the interaction of the two. 6. 24 hour rehab nursing will assist with bladder management, safety, skin/wound care, disease management, pain management and patient education and help integrate therapy concepts, techniques,education, etc. 7. PT will assess and treat for/with: Lower extremity strength, range of motion, stamina, balance, functional mobility, safety, adaptive techniques and equipment, woundcare, coping skills,  pain control, education.   Goals are: Supervision/Mod I. 8. OT will assess and treat for/with: ADL's, functional mobility, safety, upper extremity strength, adaptive techniques and equipment, wound mgt, ego support, and community reintegration.   Goals are: Supervision/Mod I. Therapy may not proceed with showering this patient. 9. Case Management and Social Worker will assess and treat for psychological issues and discharge planning. 10. Team conference will be held weekly to assess progress toward goals and to determine barriers to discharge. 11. Patient will receive at least 3 hours of therapy per day at least 5 days per week. 12. ELOS: 12-17 days.       13. Prognosis:  good  Delice Lesch, MD 02/10/2016

## 2016-02-10 NOTE — H&P (View-Only) (Deleted)
Physical Medicine and Rehabilitation Admission H&P    CC: L-AKA    HPI:  Collin Pearson is a 82 y.o. Male with history of HTN, DM, PAF, CAD with AS s/p CABG with AVR, CKD stage IV, PAD s/p B-carotid stents and history of calcified vessels BLE. He developed gangrenous changes left foot with severe erythema and skin breakdown of pretibial tissue with severe pain X 1 month and AKA recommended due to inability to salvage limb. He was admitted on 06/26 for L-AKA by Dr. Donnetta Hutching. Post op with phantom pain as well as muscle spasms. Therapy ongoing and CIR recommended for follow up therapy.    Has been seen by [employer] for prosthetic consult and retention sock ordered.   Review of Systems  HENT: Negative for hearing loss.   Eyes: Negative for blurred vision and double vision.  Respiratory: Negative for cough, shortness of breath and stridor.   Cardiovascular: Negative for chest pain, palpitations and leg swelling.  Gastrointestinal: Negative for heartburn, nausea and constipation.  Genitourinary: Negative for dysuria and urgency.  Musculoskeletal: Positive for myalgias (right heel pain) and falls. Negative for back pain and joint pain.  Skin: Negative for itching and rash.  Neurological: Positive for sensory change and weakness. Negative for dizziness and headaches.  Psychiatric/Behavioral: The patient does not have insomnia.   All other systems reviewed and are negative.  Past Medical History  Diagnosis Date  . Clotting disorder ([place])   . Coronary artery disease   . AS (aortic stenosis)     status post aortic valve replacement with an [employer] model 33 T. fracture bioprosthesis  . Anginal pain ([place])   . Hypertension   . Diabetes mellitus   . Peripheral vascular disease ([place])   . GERD (gastroesophageal reflux disease)   . CKD (chronic kidney disease) stage 4, GFR 15-29 ml/min (HCC)   . PVD (peripheral vascular disease), with hsx of bilateral carotid endarterectomies at  [facility], and bilateral carotid stenting 05/18/2012  . Heart murmur   . PAF (paroxysmal atrial fibrillation), s/p cabg 06/18/2012  . Critical lower limb ischemia   . Arthritis   . Anxiety      Past Surgical History  Procedure Laterality Date  . Endarterectomy    . Prostate surgery    . Cardiac stents    . Cardiac catheterization    . Coronary artery bypass graft  06/13/2012    Procedure: CORONARY ARTERY BYPASS GRAFTING (CABG);  Surgeon: Grace Isaac, MD;  Location: [place];  Service: Open Heart Surgery;  Laterality: N/A;  must start at 44 - appointment at 0800  . Aortic valve replacement  06/13/2012    Procedure: AORTIC VALVE REPLACEMENT (AVR);  Surgeon: Grace Isaac, MD;  Location: [place];  Service: Open Heart Surgery;  Laterality: N/A;  . Cardiac stress test  06/03/2010    Mild anterolateral ischemis  . Left and right heart catheterization with coronary angiogram N/A 05/18/2012    Procedure: LEFT AND RIGHT HEART CATHETERIZATION WITH CORONARY ANGIOGRAM;  Surgeon: Lorretta Harp, MD;  Location: [facility] CATH LAB;  Service: Cardiovascular;  Laterality: N/A;  . Carotid angiogram N/A 05/18/2012    Procedure: CAROTID ANGIOGRAM;  Surgeon: Lorretta Harp, MD;  Location: [facility] CATH LAB;  Service: Cardiovascular;  Laterality: N/A;  . Cardiac valve replacement    . Above knee leg amputation Left 02/08/2016  . Amputation Left 02/08/2016    Procedure: AMPUTATION ABOVE KNEE;  Surgeon: Rosetta Posner, MD;  Location:  [facility] OR;  Service: Vascular;  Laterality: Left;    Family History  Problem Relation Age of Onset  . Heart disease Mother 74  . Heart disease Father 20  . CAD Brother   . Heart disease Maternal Uncle   . Heart disease Paternal Uncle   . Heart disease Maternal Grandfather 68    Social History:   Married. 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 Wife works. Was ambulating with cane when out of home. Has been pushed around on rollater for past few weeks. Per  reports that he has never  smoked. He has never used smokeless tobacco. He reports that he does not drink alcohol or use illicit drugs.   Allergies: No Known Allergies    Medications Prior to Admission  Medication Sig Dispense Refill  . amLODipine (NORVASC) 10 MG tablet TAKE 1 TABLET DAILY 90 tablet 2  . aspirin EC 81 MG tablet Take 81 mg by mouth 2 (two) times daily.    Marland Kitchen atorvastatin (LIPITOR) 40 MG tablet TAKE 1 TABLET DAILY AT BEDTIME 90 tablet 1  . BD INSULIN SYRINGE ULTRAFINE 31G X 5/16" 1 ML MISC Reported on 02/02/2016    . clotrimazole-betamethasone (LOTRISONE) cream Apply 1 application topically daily as needed (dry skin, itching). Reported on 02/02/2016    . docusate sodium (STOOL SOFTENER) 100 MG capsule Take 100 mg by mouth 3 (three) times a week. Reported on 02/02/2016    . Epoetin Alfa (PROCRIT IJ) Inject 1 Dose as directed as needed. Reported on 02/02/2016    . fish oil-omega-3 fatty acids 1000 MG capsule Take 1 g by mouth 3 (three) times daily. Reported on 02/02/2016    . furosemide (LASIX) 80 MG tablet Take 1 tablet (80 mg total) by mouth daily. Take additional '40mg'$  every other day in the evening if weight goes above 134lbs 135 tablet 3  . glipiZIDE (GLUCOTROL) 10 MG tablet Take 10 mg by mouth 2 (two) times daily. Reported on 02/02/2016    . hydrALAZINE (APRESOLINE) 25 MG tablet Take 3 tablets (75 mg total) by mouth 3 (three) times daily. (Patient taking differently: Take 75 mg by mouth daily. ) 60 tablet 3  . Iron TABS Take 160 mg by mouth 2 (two) times daily.     . isosorbide mononitrate (IMDUR) 60 MG 24 hr tablet Take 1 tablet (60 mg total) by mouth daily. 30 tablet 1  . LEVEMIR 100 UNIT/ML injection Inject 20 Units into the skin at bedtime.    . Multiple Vitamin (MULTIVITAMIN WITH MINERALS) TABS Take 1 tablet by mouth daily.    . ONE TOUCH ULTRA TEST test strip Reported on 02/02/2016    . oxyCODONE-acetaminophen (PERCOCET) 10-325 MG tablet Take 1 tablet by mouth every 4 (four) hours as needed for pain.      . pantoprazole (PROTONIX) 40 MG tablet Take 40 mg by mouth daily.    . silver sulfADIAZINE (SILVADENE) 1 % cream Apply 1 application topically as needed (dry skin). Reported on 02/02/2016    . sulfamethoxazole-trimethoprim (BACTRIM DS) 800-160 MG tablet Take 1 tablet by mouth 2 (two) times daily.    . Tamsulosin HCl (FLOMAX) 0.4 MG CAPS Take 0.4 mg by mouth. One tablet in the morning and 1/2 tablet in the evening    . Vitamin D, Ergocalciferol, (DRISDOL) 50000 units CAPS capsule Take 50,000 Units by mouth every 7 (seven) days. Reported on 02/02/2016    . vitamin E (VITAMIN E) 400 UNIT capsule Take 400 Units by mouth daily. Reported on  02/02/2016      Home: Home Living Family/patient expects to be discharged to:: Private residence Living Arrangements: Spouse/significant other Available Help at Discharge: Family, Available 24 hours/day (wife works during the day, daughter may be able to stay) Type of Home: House Home Access: Stairs to enter [employer] of Steps: 3 Entrance Stairs-Rails: Right, Left Home Layout: One level Bathroom Shower/Tub: Chiropodist: [place]: Environmental consultant - 2 wheels, Environmental consultant - 4 wheels, [employer] - single point, Bedside commode, Tub bench, Wheelchair - manual  Lives With: Spouse   Functional History: Prior Function Level of Independence: Needs assistance Gait / Transfers Assistance Needed: Assist with transfers and very little amb in last 2 weeks. Sitting on rollator and family pushing him around. Until 2 weeks ago was amb with occasional use of cane. ADL's / Homemaking Assistance Needed: Assist for all ADLs in past 1-2 weeks, but prior to that was independent  Functional Status:  Mobility: Bed Mobility Overal bed mobility: Needs Assistance, +2 for physical assistance Bed Mobility: Rolling, Sidelying to Sit Rolling: Mod assist Sidelying to sit: Mod assist, +2 for physical assistance, HOB elevated General bed mobility comments:  Mod +2 assist for trunk support to come to sitting and to pivot and scoot hips to EOB. VCs for hand placement. Transfers Overall transfer level: Needs assistance Equipment used: Ambulation equipment used Transfer via Lift Equipment: Stedy Transfers: Sit to/from Stand, Risk manager Sit to Stand: Mod assist, +2 physical assistance Stand pivot transfers: Mod assist, +2 physical assistance General transfer comment: Mod +2 assist for boost to stand and to maintain balance. VCs for hand placement and safety. Pt with R lateral lean while using stedy and required min assist to maintain midline position.      ADL: ADL Overall ADL's : Needs assistance/impaired Eating/Feeding: Set up, Sitting Grooming: Wash/dry hands, Wash/dry face, Set up, Sitting Upper Body Bathing: Set up, Sitting Lower Body Bathing: Moderate assistance, +2 for physical assistance, Sit to/from stand Upper Body Dressing : Set up, Sitting Lower Body Dressing: Moderate assistance, +2 for physical assistance, Sit to/from stand Toilet Transfer: Moderate assistance, +2 for physical assistance, Cueing for safety, BSC (stedy) Toileting- Clothing Manipulation and Hygiene: Moderate assistance, +2 for physical assistance, Sit to/from stand Functional mobility during ADLs: Moderate assistance, +2 for physical assistance (stedy)  Cognition: Cognition Overall Cognitive Status: Within Functional Limits for tasks assessed Orientation Level: Oriented X4 Cognition Arousal/Alertness: Awake/alert Behavior During Therapy: WFL for tasks assessed/performed Overall Cognitive Status: Within Functional Limits for tasks assessed   Blood pressure 169/59, pulse 83, temperature 97.8 F (36.6 C), temperature source Oral, resp. rate 18, weight 58.015 kg (127 lb 14.4 oz), SpO2 97 %. Physical Exam  Nursing note and vitals reviewed. Constitutional: He is oriented to person, place, and time. He appears well-developed. He is easily aroused.    Frail appearing male. Lethargic-- reported to have pain pill recently.   HENT:  Head: Normocephalic and atraumatic.  Mouth/Throat: Oropharynx is clear and moist. No oropharyngeal exudate.  Eyes: Conjunctivae and EOM are normal. Pupils are equal, round, and reactive to light. No scleral icterus.  Neck: Normal range of motion. Neck supple.  Cardiovascular: Normal rate and regular rhythm.   Occasional extrasystoles are present.  Murmur heard. Respiratory: Breath sounds normal. No stridor. He has no wheezes.  GI: Soft. Bowel sounds are normal. He exhibits no distension. There is no tenderness.  Musculoskeletal: He exhibits tenderness (L-AKA sensitive to touch. ). He exhibits no edema.  Moderate serosanguineous drainage on  L-AKA dressing.  Right heel red and boggy-- tender to touch.   Neurological: He is oriented to person, place, and time and easily aroused.  Motor: B/l UE, RLE 5/5 proximal to distal LLE: Hip flexion 5/5  Skin: Skin is warm and dry.  Surgical amputation site with VAC.  Some ischemic changes to right lower extremity   Psychiatric: He has a normal mood and affect. His behavior is normal.    Results for orders placed or performed during the hospital encounter of 02/08/16 (from the past 48 hour(s))  Glucose, capillary     Status: Abnormal   Collection Time: 02/08/16  4:17 PM  Result Value Ref Range   Glucose-Capillary 282 (H) 65 - 99 mg/dL  Glucose, capillary     Status: Abnormal   Collection Time: 02/08/16  9:12 PM  Result Value Ref Range   Glucose-Capillary 132 (H) 65 - 99 mg/dL  Basic metabolic panel     Status: Abnormal   Collection Time: 02/09/16  3:35 AM  Result Value Ref Range   Sodium 140 135 - 145 mmol/L   Potassium 4.3 3.5 - 5.1 mmol/L   Chloride 110 101 - 111 mmol/L   CO2 21 (L) 22 - 32 mmol/L   Glucose, Bld 83 65 - 99 mg/dL   BUN 48 (H) 6 - 20 mg/dL   Creatinine, Ser 2.34 (H) 0.61 - 1.24 mg/dL   Calcium 8.9 8.9 - 10.3 mg/dL   GFR calc non Af Amer 23  (L) >60 mL/min   GFR calc Af Amer 27 (L) >60 mL/min    Comment: (NOTE) The eGFR has been calculated using the CKD EPI equation. This calculation has not been validated in all clinical situations. eGFR's persistently <60 mL/min signify possible Chronic Kidney Disease.    Anion gap 9 5 - 15  CBC     Status: Abnormal   Collection Time: 02/09/16  3:35 AM  Result Value Ref Range   WBC 13.6 (H) 4.0 - 10.5 K/uL   RBC 3.51 (L) 4.22 - 5.81 MIL/uL   Hemoglobin 10.1 (L) 13.0 - 17.0 g/dL   HCT 32.8 (L) 39.0 - 52.0 %   MCV 93.4 78.0 - 100.0 fL   MCH 28.8 26.0 - 34.0 pg   MCHC 30.8 30.0 - 36.0 g/dL   RDW 16.1 (H) 11.5 - 15.5 %   Platelets 273 150 - 400 K/uL  Glucose, capillary     Status: Abnormal   Collection Time: 02/09/16  6:06 AM  Result Value Ref Range   Glucose-Capillary 56 (L) 65 - 99 mg/dL  Glucose, capillary     Status: None   Collection Time: 02/09/16  6:28 AM  Result Value Ref Range   Glucose-Capillary 84 65 - 99 mg/dL  Glucose, capillary     Status: Abnormal   Collection Time: 02/09/16 11:14 AM  Result Value Ref Range   Glucose-Capillary 289 (H) 65 - 99 mg/dL   Comment 1 Notify RN    Comment 2 Document in Chart   Glucose, capillary     Status: Abnormal   Collection Time: 02/09/16  4:26 PM  Result Value Ref Range   Glucose-Capillary 283 (H) 65 - 99 mg/dL   Comment 1 Notify RN    Comment 2 Document in Chart   Glucose, capillary     Status: Abnormal   Collection Time: 02/09/16  9:17 PM  Result Value Ref Range   Glucose-Capillary 117 (H) 65 - 99 mg/dL  Glucose, capillary  Status: Abnormal   Collection Time: 02/10/16  2:13 AM  Result Value Ref Range   Glucose-Capillary 165 (H) 65 - 99 mg/dL  Basic metabolic panel     Status: Abnormal   Collection Time: 02/10/16  4:00 AM  Result Value Ref Range   Sodium 137 135 - 145 mmol/L   Potassium 4.2 3.5 - 5.1 mmol/L   Chloride 107 101 - 111 mmol/L   CO2 20 (L) 22 - 32 mmol/L   Glucose, Bld 118 (H) 65 - 99 mg/dL   BUN 41 (H)  6 - 20 mg/dL   Creatinine, Ser 1.94 (H) 0.61 - 1.24 mg/dL   Calcium 8.6 (L) 8.9 - 10.3 mg/dL   GFR calc non Af Amer 29 (L) >60 mL/min   GFR calc Af Amer 34 (L) >60 mL/min    Comment: (NOTE) The eGFR has been calculated using the CKD EPI equation. This calculation has not been validated in all clinical situations. eGFR's persistently <60 mL/min signify possible Chronic Kidney Disease.    Anion gap 10 5 - 15  CBC     Status: Abnormal   Collection Time: 02/10/16  4:00 AM  Result Value Ref Range   WBC 11.9 (H) 4.0 - 10.5 K/uL   RBC 3.40 (L) 4.22 - 5.81 MIL/uL   Hemoglobin 9.7 (L) 13.0 - 17.0 g/dL   HCT 31.8 (L) 39.0 - 52.0 %   MCV 93.5 78.0 - 100.0 fL   MCH 28.5 26.0 - 34.0 pg   MCHC 30.5 30.0 - 36.0 g/dL   RDW 15.9 (H) 11.5 - 15.5 %   Platelets 271 150 - 400 K/uL  Glucose, capillary     Status: Abnormal   Collection Time: 02/10/16  6:18 AM  Result Value Ref Range   Glucose-Capillary 63 (L) 65 - 99 mg/dL  Glucose, capillary     Status: Abnormal   Collection Time: 02/10/16  7:50 AM  Result Value Ref Range   Glucose-Capillary 101 (H) 65 - 99 mg/dL  Glucose, capillary     Status: Abnormal   Collection Time: 02/10/16 11:31 AM  Result Value Ref Range   Glucose-Capillary 126 (H) 65 - 99 mg/dL   No results found.     Medical Problem List and Plan: 1.  Abnormality of gait secondary to left AKA. 2.  DVT Prophylaxis/Anticoagulation: Pharmaceutical: Lovenox 3. Pain Management: Continue oxycodone prn and robaxin tid. Will add low dose Neurontin to help with neuropathy.  4. Mood: LCSW to follow for evaluation and support.  5. Neuropsych: This patient is capable of making decisions on his own behalf. 6. Skin/Wound Care: Change dressing bid to keep area dry. Will order Prevalon boot for right heel pain.  7. Fluids/Electrolytes/Nutrition: Monitor I/O. Add supplements as intake has been poor.  8. T2DM poor; controlled: Hgb A1c- 8.0.  Monitor BS ac/hs. Continue glucotrol bid and Levemir  daily with SSI for elevated BS.  9. CAD: Monitor for symptoms with activity. On Imdur, ASA and Lipitor.  10. Leucocytosis: Monitor for signs of infection. Encourage IS.  11. Acute on chronic renal failure: Has improved with hydration. BUN/Cr- 69/4.06 at admission.  12. Acute on chronic anemia: Continue iron supplement. Recheck CBC in am.  13. GERD: Symptoms controlled on protonix.  14. HTN: Monitor BP bid. Continue Norvasc,  Furosemide, and Hydralazine.    Post Admission Physician Evaluation: 1. Functional deficits secondary  to left AKA. 2. Patient is admitted to receive collaborative, interdisciplinary care between the physiatrist, rehab nursing staff, and therapy  team. 3. Patient's level of medical complexity and substantial therapy needs in context of that medical necessity cannot be provided at a lesser intensity of care such as a SNF. 4. Patient has experienced substantial functional loss from his/her baseline which was documented above under the "Functional History" and "Functional Status" headings.  Judging by the patient's diagnosis, physical exam, and functional history, the patient has potential for functional progress which will result in measurable gains while on inpatient rehab.  These gains will be of substantial and practical use upon discharge  in facilitating mobility and self-care at the household level. 5. Physiatrist will provide 24 hour management of medical needs as well as oversight of the therapy plan/treatment and provide guidance as appropriate regarding the interaction of the two. 6. 24 hour rehab nursing will assist with bladder management, safety, skin/wound care, disease management, pain management and patient education and help integrate therapy concepts, techniques,education, etc. 7. PT will assess and treat for/with: Lower extremity strength, range of motion, stamina, balance, functional mobility, safety, adaptive techniques and equipment, woundcare, coping skills,  pain control, education.   Goals are: Supervision/Mod I. 8. OT will assess and treat for/with: ADL's, functional mobility, safety, upper extremity strength, adaptive techniques and equipment, wound mgt, ego support, and community reintegration.   Goals are: Supervision/Mod I. Therapy may not proceed with showering this patient. 9. Case Management and Social Worker will assess and treat for psychological issues and discharge planning. 10. Team conference will be held weekly to assess progress toward goals and to determine barriers to discharge. 11. Patient will receive at least 3 hours of therapy per day at least 5 days per week. 12. ELOS: 12-17 days.       13. Prognosis:  good  Delice Lesch, MD 02/10/2016

## 2016-02-10 NOTE — Interval H&P Note (Deleted)
Collin Pearson was admitted today to Inpatient Rehabilitation with the diagnosis of left AKA.  The patient's history has been reviewed, patient examined, and there is no change in status.  Patient continues to be appropriate for intensive inpatient rehabilitation.  I have reviewed the patient's chart and labs.  Questions were answered to the patient's satisfaction. The PAPE has been reviewed and assessment remains appropriate.  Ankit Lorie Phenix 02/10/2016, 4:33 PM

## 2016-02-10 NOTE — Progress Notes (Signed)
Orthopedic Tech Progress Note Patient Details:  Collin Pearson 01-07-1927 [id number]  Patient ID: [name], male   DOB: 04/29/1927, 82 y.o.   MRN: [id number] Called in bio-tech brace order; spoke with Buckner Malta, Kavonte Bearse 02/10/2016, 12:31 PM

## 2016-02-10 NOTE — Progress Notes (Signed)
In initial skin exam, noted stage I pressure ulcer on R sacral area. Documented and applied foam dressing. Educated family about turn schedule and placed patient on his side to relieve pressure. Will report to oncoming RN.

## 2016-02-10 NOTE — Progress Notes (Addendum)
[facility], he pulled his left arm IV out over night new access used on the right.     Objective 133/57 83 97.8 F (36.6 C) (Oral) 18 97%  Intake/Output Summary (Last 24 hours) at 02/10/16 0750 Last data filed at 02/10/16 [id number]  Gross per 24 hour  Intake    240 ml  Output   1550 ml  Net  -1310 ml    Left AKA stump clean and dry, applied new dressing He keeps his hip flexed, encouraged extension  Assessment/Planning: POD # 2 Left AKA  Pending rehab, PT/OT Will order [employer]otech   [facility] 02/10/2016 7:50 AM --  Laboratory Lab Results:  Recent Labs  02/09/16 0335 02/10/16 0400  WBC 13.6* 11.9*  HGB 10.1* 9.7*  HCT 32.8* 31.8*  PLT 273 271   BMET  Recent Labs  02/09/16 0335 02/10/16 0400  NA 140 137  K 4.3 4.2  CL 110 107  CO2 21* 20*  GLUCOSE 83 118*  BUN 48* 41*  CREATININE 2.34* 1.94*  CALCIUM 8.9 8.6*    COAG Lab Results  Component Value Date   INR 1.22 02/06/2015   INR 1.55* 06/13/2012   INR 1.06 06/12/2012   No results found for: PTT    I have examined the patient, reviewed and agree with above.Comfortable. These with the patient and his family.  our plan continued physical therapy. And eventual rehabilitation  Curt Jews, MD 02/10/2016 3:16 PM

## 2016-02-10 NOTE — Progress Notes (Signed)
I await insurance approval to possibly admit pt to inpt rehab today or tomorrow pending their decision. Pt and daughter at bedside and are aware. [id number]

## 2016-02-10 NOTE — Progress Notes (Signed)
I have insurance approval to admit pt to inpt rehab and bed is available today. I contacted Maurenn PA for Dr. Donnetta Hutching and she is in agreement to d/c pt to inpt rehab today. I will alert RN CM and SW and make the arrangements to admit today. [id number]

## 2016-02-10 NOTE — Progress Notes (Signed)
Patient was transferred by bed to 4MW. Report was given. Wife and daughter accompanied him to his new room.

## 2016-02-11 ENCOUNTER — Inpatient Hospital Stay (HOSPITAL_COMMUNITY): Payer: BLUE CROSS/BLUE SHIELD | Admitting: Physical Therapy

## 2016-02-11 ENCOUNTER — Inpatient Hospital Stay (HOSPITAL_COMMUNITY): Payer: BLUE CROSS/BLUE SHIELD | Admitting: Occupational Therapy

## 2016-02-11 ENCOUNTER — Inpatient Hospital Stay (HOSPITAL_COMMUNITY): Payer: BLUE CROSS/BLUE SHIELD | Admitting: *Deleted

## 2016-02-11 LAB — COMPREHENSIVE METABOLIC PANEL
ALBUMIN: 2.3 g/dL — AB (ref 3.5–5.0)
ALT: 40 U/L (ref 17–63)
AST: 41 U/L (ref 15–41)
Alkaline Phosphatase: 74 U/L (ref 38–126)
Anion gap: 7 (ref 5–15)
BUN: 41 mg/dL — AB (ref 6–20)
CHLORIDE: 108 mmol/L (ref 101–111)
CO2: 21 mmol/L — ABNORMAL LOW (ref 22–32)
Calcium: 8.8 mg/dL — ABNORMAL LOW (ref 8.9–10.3)
Creatinine, Ser: 1.92 mg/dL — ABNORMAL HIGH (ref 0.61–1.24)
GFR calc Af Amer: 34 mL/min — ABNORMAL LOW (ref >60)
GFR, EST NON AFRICAN AMERICAN: 30 mL/min — AB (ref >60)
Glucose, Bld: 43 mg/dL — CL (ref 65–99)
POTASSIUM: 4.1 mmol/L (ref 3.5–5.1)
SODIUM: 136 mmol/L (ref 135–145)
Total Bilirubin: 0.5 mg/dL (ref 0.3–1.2)
Total Protein: 6.2 g/dL — ABNORMAL LOW (ref 6.5–8.1)

## 2016-02-11 LAB — CBC WITH DIFFERENTIAL/PLATELET
BASOS ABS: 0 10*3/uL (ref 0.0–0.1)
BASOS PCT: 0 %
EOS ABS: 0.3 10*3/uL (ref 0.0–0.7)
EOS PCT: 3 %
HCT: 32.3 % — ABNORMAL LOW (ref 39.0–52.0)
Hemoglobin: 10 g/dL — ABNORMAL LOW (ref 13.0–17.0)
Lymphocytes Relative: 13 %
Lymphs Abs: 1.5 10*3/uL (ref 0.7–4.0)
MCH: 29.8 pg (ref 26.0–34.0)
MCHC: 31 g/dL (ref 30.0–36.0)
MCV: 96.1 fL (ref 78.0–100.0)
MONO ABS: 1.3 10*3/uL — AB (ref 0.1–1.0)
Monocytes Relative: 11 %
Neutro Abs: 8.5 10*3/uL — ABNORMAL HIGH (ref 1.7–7.7)
Neutrophils Relative %: 73 %
PLATELETS: 278 10*3/uL (ref 150–400)
RBC: 3.36 MIL/uL — ABNORMAL LOW (ref 4.22–5.81)
RDW: 15.6 % — AB (ref 11.5–15.5)
WBC: 11.7 10*3/uL — ABNORMAL HIGH (ref 4.0–10.5)

## 2016-02-11 LAB — GLUCOSE, CAPILLARY
GLUCOSE-CAPILLARY: 62 mg/dL — AB (ref 65–99)
GLUCOSE-CAPILLARY: 205 mg/dL — AB (ref 65–99)
GLUCOSE-CAPILLARY: 300 mg/dL — AB (ref 65–99)
Glucose-Capillary: 34 mg/dL — CL (ref 65–99)
Glucose-Capillary: 54 mg/dL — ABNORMAL LOW (ref 65–99)
Glucose-Capillary: 235 mg/dL — ABNORMAL HIGH (ref 65–99)

## 2016-02-11 MED ORDER — GLUCOSE 40 % PO GEL
ORAL | Status: AC
  Filled 2016-02-11: qty 1

## 2016-02-11 MED ORDER — INSULIN DETEMIR 100 UNIT/ML ~~LOC~~ SOLN
15.0000 [IU] | Freq: Every day | SUBCUTANEOUS | Status: DC
  Administered 2016-02-11 – 2016-02-14 (×4): 15 [IU] via SUBCUTANEOUS
  Filled 2016-02-11 (×5): qty 0.15

## 2016-02-11 MED ORDER — GLUCERNA SHAKE PO LIQD
237.0000 mL | Freq: Two times a day (BID) | ORAL | Status: DC
  Administered 2016-02-12 – 2016-02-23 (×21): 237 mL via ORAL

## 2016-02-11 MED ORDER — DEXTROSE 50 % IV SOLN
INTRAVENOUS | Status: AC
  Filled 2016-02-11: qty 50

## 2016-02-11 MED ORDER — PRO-STAT SUGAR FREE PO LIQD
30.0000 mL | Freq: Two times a day (BID) | ORAL | Status: DC
  Administered 2016-02-11 – 2016-02-17 (×13): 30 mL via ORAL
  Filled 2016-02-11 (×13): qty 30

## 2016-02-11 MED ORDER — FERROUS SULFATE 300 (60 FE) MG/5ML PO SYRP
300.0000 mg | ORAL_SOLUTION | Freq: Two times a day (BID) | ORAL | Status: DC
  Administered 2016-02-11 – 2016-02-25 (×26): 300 mg via ORAL
  Filled 2016-02-11 (×28): qty 5

## 2016-02-11 NOTE — Care Management Note (Signed)
[facility] Individual Statement of Services  Patient Name:  Collin Pearson  Date:  02/11/2016  Welcome to the [facility].  Our goal is to provide you with an individualized program based on your diagnosis and situation, designed to meet your specific needs.  With this comprehensive rehabilitation program, you will be expected to participate in at least 3 hours of rehabilitation therapies Monday-Friday, with modified therapy programming on the weekends.  Your rehabilitation program will include the following services:  Physical Therapy (PT), Occupational Therapy (OT), 24 hour per day rehabilitation nursing, Case Management (Social Worker), Rehabilitation Medicine, Nutrition Services and Pharmacy Services  Weekly team conferences will be held on Wednesday to discuss your progress.  Your Social Worker will talk with you frequently to get your input and to update you on team discussions.  Team conferences with you and your family in attendance may also be held.  Expected length of stay: 10-14 days Overall anticipated outcome: supervision with cueing  Depending on your progress and recovery, your program may change. Your Social Worker will coordinate services and will keep you informed of any changes. Your Social Worker's name and contact numbers are listed  below.  The following services may also be recommended but are not provided by the [facility] [place] will be made to provide these services after discharge if needed.  Arrangements include referral to agencies that provide these services.  Your insurance has been verified to be:  [employer] [place] [employer] Your primary doctor is:  Delanna Notice  Pertinent information will be shared with your doctor and your insurance company.  Social Worker:  Ovidio Kin, [place]  or ([phone]  Information discussed with and copy given to patient by: Elease Hashimoto, 02/11/2016, 9:12 AM

## 2016-02-11 NOTE — Significant Event (Signed)
Hypoglycemic Event  CBG: 54  Treatment: 15 GM carbohydrate snack  Symptoms: None  Follow-up CBG: Time:0905   CBG Result:205  Possible Reasons for Event: Unknown  Comments/MD notified:Pam Love, PA    [place], Jackelyn Hoehn

## 2016-02-11 NOTE — Progress Notes (Signed)
Patient information reviewed and entered into eRehab system by Ysidro Ramsay, RN, CRRN, PPS Coordinator.  Information including medical coding and functional independence measure will be reviewed and updated through discharge.     Per nursing patient was given "Data Collection Information Summary for Patients in Inpatient Rehabilitation Facilities with attached "Privacy Act Statement-Health Care Records" upon admission.  

## 2016-02-11 NOTE — Significant Event (Signed)
Hypoglycemic Event  CBG:43  Treatment: 1 tube instant glucose  Symptoms: Hungry  Follow-up CBG: [id number] CBG Result:54  Possible Reasons for Event: Inadequate meal intake  Comments/MD notified:Pam Love    [place], Jackelyn Hoehn

## 2016-02-11 NOTE — Progress Notes (Addendum)
Occupational Therapy Assessment and Plan  Patient Details  Name: Collin Pearson MRN: [id number] Date of Birth: 05-Jun-1927  OT Diagnosis: abnormal posture, muscle weakness (generalized) and swelling of limb Rehab Potential: Rehab Potential (ACUTE ONLY): Good ELOS: 14 days    Today's Date: 02/11/2016 OT Individual Time:  -  1100-1200 60 minutes        Problem List:  Patient Active Problem List   Diagnosis Date Noted  . Unilateral AKA ([place]) 02/10/2016  . Gastroesophageal reflux disease with esophagitis   . Anemia of chronic disease   . AKI (acute kidney injury) ([place])   . Status post below knee amputation of left lower extremity ([place])   . Coronary artery disease involving coronary bypass graft of native heart without angina pectoris   . S/P AVR   . Benign essential HTN   . Diabetes mellitus type 2 in nonobese (HCC)   . S/p bilateral carotid endarterectomy   . Chronic kidney disease (CKD), stage IV (severe) ([place])   . Acute blood loss anemia   . Post-operative pain   . Leukocytosis   . PAD (peripheral artery disease) ([place]) 02/08/2016  . Acute on chronic diastolic CHF (congestive heart failure), NYHA class 1 ([place]) 02/09/2015  . Physical deconditioning   . Acute on chronic renal failure ([place]) 02/07/2015  . Atrial fibrillation ([place]) 02/07/2015  . Bradycardia 02/04/2015  . Encounter for nasogastric tube placement   . Endotracheally intubated   . Hypoxia   . Pulmonary edema   . Peripheral arterial disease ([place]) 08/22/2014  . Carotid artery stenosis,Last dopplers 09/20/12 05/29/2013  . Ventral hernia 10/04/2012  . PAF (paroxysmal atrial fibrillation), s/p cabg 06/18/2012  . S/P CABG x 1 with tissue AVR 06/13/12 06/14/2012  . Rib fractures, several, left side  06/11/2012  . CKD (chronic kidney disease) stage 4, GFR 15-29 ml/min (HCC) 06/11/2012  . HTN (hypertension) 06/06/2012  . Dyslipidemia 06/06/2012  . Diabetes mellitus ([place]) 06/06/2012  . NSTEMI (non-ST elevated myocardial  infarction) ([place]) 06/06/2012  . Chronic anemia 06/06/2012  . Acute pulmonary edema ([place]) 06/06/2012  . PVD, with hx of bilateral CEA at [facility], and previous bilateral carotid stenting, with high grade ISR of Rt. carotid 05/18/12 05/18/2012  . Angina effort ([place]) 05/18/2012  . Clotting disorder ([place])   . CAD, patent stent to LAD and mid RCA with occluded OM2 branch     . AS (aortic stenosis), critical stenosis     Past Medical History:  Past Medical History  Diagnosis Date  . Clotting disorder ([employer])   . Coronary artery disease   . AS (aortic stenosis)     status post aortic valve replacement with an [employer] model 77 T. fracture bioprosthesis  . Anginal pain ([place])   . Hypertension   . Diabetes mellitus   . Peripheral vascular disease ([place])   . GERD (gastroesophageal reflux disease)   . CKD (chronic kidney disease) stage 4, GFR 15-29 ml/min (HCC)   . PVD (peripheral vascular disease), with hsx of bilateral carotid endarterectomies at [facility], and bilateral carotid stenting 05/18/2012  . Heart murmur   . PAF (paroxysmal atrial fibrillation), s/p cabg 06/18/2012  . Critical lower limb ischemia   . Arthritis   . Anxiety    Past Surgical History:  Past Surgical History  Procedure Laterality Date  . Endarterectomy    . Prostate surgery    . Cardiac stents    . Cardiac catheterization    . Coronary artery bypass graft  06/13/2012  Procedure: CORONARY ARTERY BYPASS GRAFTING (CABG);  Surgeon: Grace Isaac, MD;  Location: [place];  Service: Open Heart Surgery;  Laterality: N/A;  must start at 20 - appointment at 0800  . Aortic valve replacement  06/13/2012    Procedure: AORTIC VALVE REPLACEMENT (AVR);  Surgeon: Grace Isaac, MD;  Location: [place];  Service: Open Heart Surgery;  Laterality: N/A;  . Cardiac stress test  06/03/2010    Mild anterolateral ischemis  . Left and right heart catheterization with coronary angiogram N/A 05/18/2012    Procedure: LEFT AND  RIGHT HEART CATHETERIZATION WITH CORONARY ANGIOGRAM;  Surgeon: Lorretta Harp, MD;  Location: [facility] CATH LAB;  Service: Cardiovascular;  Laterality: N/A;  . Carotid angiogram N/A 05/18/2012    Procedure: CAROTID ANGIOGRAM;  Surgeon: Lorretta Harp, MD;  Location: [facility] CATH LAB;  Service: Cardiovascular;  Laterality: N/A;  . Cardiac valve replacement    . Above knee leg amputation Left 02/08/2016  . Amputation Left 02/08/2016    Procedure: AMPUTATION ABOVE KNEE;  Surgeon: Rosetta Posner, MD;  Location: [facility] OR;  Service: Vascular;  Laterality: Left;    Assessment & Plan Clinical Impression: Collin Pearson is a 82 y.o. year old male with recent admission to the hospital on 02/11/16 with  history of HTN, DM, PAF, CAD with AS s/p CABG with AVR, CKD stage IV, PAD s/p B-carotid stents and history of calcified vessels BLE. He developed gangrenous changes left foot with severe erythema and skin breakdown of pretibial tissue with severe pain X 1 month and AKA recommended due to inability to salvage limb. He was admitted on 06/26 for L-AKA by Dr. Donnetta Hutching. Post op with phantom pain as well as muscle spasms. Patient was transferred to CIR.  Patient currently requires overall Mod-Max A with ADLs secondary to muscle weakness, decreased cardiorespiratoy endurance and decreased standing balance, decreased postural control and decreased balance strategies.  Prior to hospitalization, patient could complete ADLs with Min A from wife.  Patient will benefit from skilled intervention to decrease level of assist with basic self-care skills prior to discharge home with care partner.  Anticipate patient will require minimal physical assistance and follow up home health.  OT Assessment Rehab Potential (ACUTE ONLY): Good Barriers to Discharge: Decreased caregiver support Barriers to Discharge Comments: Family plans to hire 24/7 PCA to provide supervision at home  OT Patient demonstrates impairments in the following area(s):  Balance;Endurance;Pain;Perception;Sensory;Skin Integrity OT Basic ADL's Functional Problem(s): Bathing;Dressing;Toileting OT Transfers Functional Problem(s): Toilet OT Plan/Intensity: Minimum 1-2xdaily for 45 to 90 minutes OT Frequency: Total of 15 hours over 7 days of combined therapies OT Duration/Estimated Length of Stay: 14 days  OT Treatment/Interventions: Medical illustrator training;Community reintegration;Discharge planning;DME/adaptive equipment instruction;Functional mobility training;Neuromuscular re-education;Pain management;Self Care/advanced ADL retraining;Psychosocial support;Therapeutic Activities;Patient/family education;Wheelchair propulsion/positioning;Therapeutic Exercise;UE/LE Strength taining/ROM;UE/LE Coordination activities OT Self Feeding Anticipated Outcome(s): N/A OT Basic Self-Care Anticipated Outcome(s): Pt will perform ADLs with overall Min A  OT Toileting Anticipated Outcome(s): Pt will perform toileting with overall Min A  OT Bathroom Transfers Anticipated Outcome(s): Pt will complete bathroom transfer with Min A  OT Recommendation Patient destination: Home Follow Up Recommendations: Home health OT;24 hour supervision/assistance Equipment Recommended: To be determined   Skilled Therapeutic Intervention Today pt participated in self care mgt retraining tx for CIR evaluation. Pts daughter was present. Pt was seated in w/c upon skilled OT arrival and was agreeable to complete ADLs w/c level at sink. Pt completed UB bathing with Min A for back and UB dressing with Mod  A for button up shirt. Pt completed LB bathing with Max A for buttocks while standing with Max A. Pt required manual cues to straighten R LE and exhibited balance deficits without L LE for standing support. Pt completed LB dressing with Total Ax2 with one helper assisting with balance and the other pulling LB garments over hips. Pt completed grooming, oral care, shaving, and donning hearing aides with  supervision w/c level. Pt and daughter were provided education on desensitization activities and importance of incorporating those activities into daily routine to minimize pain. At end of tx pt left in w/c with all needs within reach and daughter present. Pt and daughter were engaged in discussion regarding OT plan of care and patient-centered goals with verbalized understanding. Interprofessional collaboration completed with RN and PT for goal development and patient care.     OT Evaluation Precautions/Restrictions    General   Vital Signs Therapy Vitals Temp: 97.5 F (36.4 C) Temp Source: Oral Pulse Rate: 90 Resp: 19 BP: (!) 148/52 mmHg Patient Position (if appropriate): Sitting Oxygen Therapy SpO2: 100 % O2 Device: Not Delivered Pain   Home Living/Prior Functioning Home Living Family/patient expects to be discharged to:: Private residence Living Arrangements: Spouse/significant other Available Help at Discharge: Family, Available 24 hours/day Type of Home: House Home Access: Stairs to enter Entrance Stairs-Number of Steps: 3 Entrance Stairs-Rails: Right, Left Home Layout: One level Bathroom Shower/Tub: Tub/shower unit, Curtain Bathroom Toilet: Standard Bathroom Accessibility: Yes Additional Comments: was using rollator RW with seat two weeks pta  Lives With: Spouse IADL History Homemaking Responsibilities: No Occupation: Retired Type of Occupation: preacher  Leisure and Hobbies: spending time with family Prior Function Level of Independence: Independent with basic ADLs  Able to Take Stairs?: Yes Driving: Yes Vocation: Retired Comments: uses cane vs RW depending on how he feels ADL ADL Eating: Modified independent Where Assessed-Eating: Wheelchair Grooming: Setup Where Assessed-Grooming: Wheelchair Upper Body Bathing: Minimal assistance Where Assessed-Upper Body Bathing: Wheelchair Lower Body Bathing: Moderate assistance Where Assessed-Lower Body Bathing:  Wheelchair Upper Body Dressing: Moderate assistance Where Assessed-Upper Body Dressing: Wheelchair Lower Body Dressing: Maximal assistance Where Assessed-Lower Body Dressing: Wheelchair Toileting: Not assessed Toilet Transfer: Not assessed Toilet Transfer Method: Unable to assess Tub/Shower Transfer: Not assessed Vision/Perception  Vision- History Baseline Vision/History: Wears glasses Wears Glasses: At all times Patient Visual Report: No change from baseline Vision- Assessment Vision Assessment?: No apparent visual deficits Eye Alignment: Within Functional Limits  Cognition Overall Cognitive Status: Within Functional Limits for tasks assessed Arousal/Alertness: Awake/alert Orientation Level: Person;Place;Situation Person: Oriented Place: Oriented Situation: Oriented Year: 2017 Month: July Day of Week: Correct Memory: Appears intact Immediate Memory Recall: Sock;Blue;Bed Memory Recall: Sock;Bed;Blue Memory Recall Sock: Without Cue Memory Recall Blue: With Cue Memory Recall Bed: Without Cue Attention: Sustained Focused Attention: Appears intact Sustained Attention: Appears intact Awareness: Appears intact Problem Solving: Appears intact Executive Function: Initiating;Organizing;Sequencing;Reasoning Reasoning: Appears intact Sequencing: Appears intact Organizing: Appears intact Initiating: Appears intact Safety/Judgment: Appears intact Sensation Sensation Light Touch: Impaired Detail Hot/Cold: Appears Intact (for bilateral UEs) Motor  Motor Motor: Within Functional Limits Mobility  Transfers Sit to Stand: 2: Max assist Sit to Stand Details: Manual facilitation for placement;Manual facilitation for weight shifting;Verbal cues for technique;Verbal cues for precautions/safety  Trunk/Postural Assessment  Cervical Assessment Cervical Assessment: Within Functional Limits Thoracic Assessment Thoracic Assessment: Within Functional Limits Lumbar Assessment Lumbar  Assessment: Within Functional Limits Postural Control Postural Control: Deficits on evaluation Trunk Control:  (Posterior leaning observed while standing during ADL completion)  Balance Balance   Balance Assessed: Yes Static Standing Balance Static Standing - Balance Support: Bilateral upper extremity supported Static Standing - Level of Assistance: 2: Max assist Extremity/Trunk Assessment RUE Assessment RUE Assessment: Within Functional Limits LUE Assessment LUE Assessment: Within Functional Limits   See Function Navigator for Current Functional Status.   Refer to Care Plan for Long Term Goals  Recommendations for other services: None  Discharge Criteria: Patient will be discharged from OT if patient refuses treatment 3 consecutive times without medical reason, if treatment goals not met, if there is a change in medical status, if patient makes no progress towards goals or if patient is discharged from hospital.  The above assessment, treatment plan, treatment alternatives and goals were discussed and mutually agreed upon: by patient and by family  Michaela A Hoffman 02/11/2016, 3:24 PM  

## 2016-02-11 NOTE — IPOC Note (Addendum)
Overall Plan of Care [facility]) Patient Details Name: Collin Pearson MRN: [id number] DOB: 1927-05-31  Admitting Diagnosis: L AKA  Hospital Problems: Active Problems:   Unilateral AKA ([place])     Functional Problem List: Nursing Bladder, Bowel, Endurance, Medication Management, Motor, Nutrition, Pain, Perception, Safety, Sensory, Skin Integrity  PT Balance, Endurance, Pain, Sensory, Safety, Skin Integrity  OT Balance, Endurance, Pain, Perception, Sensory, Skin Integrity  SLP    TR         Basic ADL's: OT Bathing, Dressing, Toileting     Advanced  ADL's: OT       Transfers: PT Bed Mobility, Bed to Chair, Car  OT Toilet     Locomotion: PT Ambulation, Emergency planning/management officer, Stairs     Additional Impairments: OT    SLP        TR      Anticipated Outcomes Item Anticipated Outcome  Self Feeding N/A  Swallowing      Basic self-care  Pt will perform ADLs with overall Min A   Toileting  Pt will perform toileting with overall Min A    Bathroom Transfers Pt will complete bathroom transfer with Min A   Bowel/Bladder  manage with min assit  Transfers  Mod I basic transfers  Locomotion  S x150' with LRAD  Communication     Cognition     Pain  Less than 3  Safety/Judgment  Will remain free of skin breakdown, infection and falls   Therapy Plan: PT Intensity: Minimum of 1-2 x/day ,45 to 90 minutes PT Frequency: 5 out of 7 days PT Duration Estimated Length of Stay: 10-14 days OT Frequency: Total of 15 hours over 7 days of combined therapies OT Duration/Estimated Length of Stay: 14 days    OT Intensity: Minimum of 1-2x/day 45 to 90 minutes      Team Interventions: Nursing Interventions Patient/Family Education, Bladder Management, Bowel Management, Disease Management/Prevention, Pain Management, Medication Management, Skin Care/Wound Management, Discharge Planning, Psychosocial Support  PT interventions Ambulation/gait training, Training and development officer, Community  reintegration, Cognitive remediation/compensation, Discharge planning, Disease management/prevention, DME/adaptive equipment instruction, Functional mobility training, Neuromuscular re-education, Pain management, Patient/family education, Psychosocial support, Skin care/wound management, Splinting/orthotics, Stair training, Therapeutic Activities, Therapeutic Exercise, UE/LE Strength taining/ROM, Wheelchair propulsion/positioning  OT Interventions Training and development officer, Community reintegration, Discharge planning, DME/adaptive equipment instruction, Functional mobility training, Neuromuscular re-education, Pain management, Self Care/advanced ADL retraining, Psychosocial support, Therapeutic Activities, Patient/family education, Wheelchair propulsion/positioning, Therapeutic Exercise, UE/LE Strength taining/ROM, UE/LE Coordination activities  SLP Interventions    TR Interventions    SW/CM Interventions Discharge Planning, Psychosocial Support, Patient/Family Education    Team Discharge Planning: Destination: PT-Home ,OT- Home , SLP-  Projected Follow-up: PT-Home health PT, 24 hour supervision/assistance, OT-  Home health OT, 24 hour supervision/assistance, SLP-  Projected Equipment Needs: PT-Rolling walker with 5" wheels, Wheelchair cushion (measurements), Other (comment), OT- To be determined, SLP-  Equipment Details: PT-Pt has rollator and manual WC, but he may need RW and cushion for skin breakdown, OT-  Patient/family involved in discharge planning: PT- Patient, Family member/caregiver,  OT-Patient, Family member/caregiver, SLP-   MD ELOS: 12-15 days. Medical Rehab Prognosis:  Good Assessment: 82 y.o. Male with history of HTN, DM, PAF, CAD with AS s/p CABG with AVR, CKD stage IV, PAD s/p B-carotid stents and history of calcified vessels BLE. He developed gangrenous changes left foot with severe erythema and skin breakdown of pretibial tissue with severe pain X 1 month and AKA recommended due  to inability to salvage limb. He was admitted  on 06/26 for L-AKA by Dr. Donnetta Hutching. Post op with phantom pain as well as muscle spasms. Resulting functional deficits with mobility and ?lethargy. Will set goals for Min A/Mod I with therapies.    See Team Conference Notes for weekly updates to the plan of care

## 2016-02-11 NOTE — Progress Notes (Signed)
Social Work  Social Work Assessment and Plan  Patient Details  Name: Collin Pearson MRN: [id number] Date of Birth: 05/26/1927  Today's Date: 02/11/2016  Problem List:  Patient Active Problem List   Diagnosis Date Noted  . Unilateral AKA ([place]) 02/10/2016  . Gastroesophageal reflux disease with esophagitis   . Anemia of chronic disease   . AKI (acute kidney injury) ([place])   . Status post below knee amputation of left lower extremity ([place])   . Coronary artery disease involving coronary bypass graft of native heart without angina pectoris   . S/P AVR   . Benign essential HTN   . Diabetes mellitus type 2 in nonobese (HCC)   . S/p bilateral carotid endarterectomy   . Chronic kidney disease (CKD), stage IV (severe) ([place])   . Acute blood loss anemia   . Post-operative pain   . Leukocytosis   . PAD (peripheral artery disease) ([place]) 02/08/2016  . Acute on chronic diastolic CHF (congestive heart failure), NYHA class 1 ([place]) 02/09/2015  . Physical deconditioning   . Acute on chronic renal failure ([place]) 02/07/2015  . Atrial fibrillation ([place]) 02/07/2015  . Bradycardia 02/04/2015  . Encounter for nasogastric tube placement   . Endotracheally intubated   . Hypoxia   . Pulmonary edema   . Peripheral arterial disease ([place]) 08/22/2014  . Carotid artery stenosis,Last dopplers 09/20/12 05/29/2013  . Ventral hernia 10/04/2012  . PAF (paroxysmal atrial fibrillation), s/p cabg 06/18/2012  . S/P CABG x 1 with tissue AVR 06/13/12 06/14/2012  . Rib fractures, several, left side  06/11/2012  . CKD (chronic kidney disease) stage 4, GFR 15-29 ml/min (HCC) 06/11/2012  . HTN (hypertension) 06/06/2012  . Dyslipidemia 06/06/2012  . Diabetes mellitus ([place]) 06/06/2012  . NSTEMI (non-ST elevated myocardial infarction) ([place]) 06/06/2012  . Chronic anemia 06/06/2012  . Acute pulmonary edema ([place]) 06/06/2012  . PVD, with hx of bilateral CEA at [facility], and previous bilateral carotid stenting, with high grade  ISR of Rt. carotid 05/18/12 05/18/2012  . Angina effort ([place]) 05/18/2012  . Clotting disorder ([place])   . CAD, patent stent to LAD and mid RCA with occluded OM2 branch     . AS (aortic stenosis), critical stenosis    Past Medical History:  Past Medical History  Diagnosis Date  . Clotting disorder ([place])   . Coronary artery disease   . AS (aortic stenosis)     status post aortic valve replacement with an [employer] model 23 T. fracture bioprosthesis  . Anginal pain ([place])   . Hypertension   . Diabetes mellitus   . Peripheral vascular disease ([place])   . GERD (gastroesophageal reflux disease)   . CKD (chronic kidney disease) stage 4, GFR 15-29 ml/min (HCC)   . PVD (peripheral vascular disease), with hsx of bilateral carotid endarterectomies at [facility], and bilateral carotid stenting 05/18/2012  . Heart murmur   . PAF (paroxysmal atrial fibrillation), s/p cabg 06/18/2012  . Critical lower limb ischemia   . Arthritis   . Anxiety    Past Surgical History:  Past Surgical History  Procedure Laterality Date  . Endarterectomy    . Prostate surgery    . Cardiac stents    . Cardiac catheterization    . Coronary artery bypass graft  06/13/2012    Procedure: CORONARY ARTERY BYPASS GRAFTING (CABG);  Surgeon: Grace Isaac, MD;  Location: [place];  Service: Open Heart Surgery;  Laterality: N/A;  must start at 25 - appointment at 0800  .  Aortic valve replacement  06/13/2012    Procedure: AORTIC VALVE REPLACEMENT (AVR);  Surgeon: Grace Isaac, MD;  Location: [place];  Service: Open Heart Surgery;  Laterality: N/A;  . Cardiac stress test  06/03/2010    Mild anterolateral ischemis  . Left and right heart catheterization with coronary angiogram N/A 05/18/2012    Procedure: LEFT AND RIGHT HEART CATHETERIZATION WITH CORONARY ANGIOGRAM;  Surgeon: Lorretta Harp, MD;  [facility] LAB;  Service: Cardiovascular;  Laterality: N/A;  . Carotid angiogram N/A 05/18/2012    Procedure:  CAROTID ANGIOGRAM;  Surgeon: Lorretta Harp, MD;  Location: [facility] CATH LAB;  Service: Cardiovascular;  Laterality: N/A;  . Cardiac valve replacement    . Above knee leg amputation Left 02/08/2016  . Amputation Left 02/08/2016    Procedure: AMPUTATION ABOVE KNEE;  Surgeon: Rosetta Posner, MD;  Location: [place];  Service: Vascular;  Laterality: Left;   Social History:  reports that he has never smoked. He has never used smokeless tobacco. He reports that he does not drink alcohol or use illicit drugs.  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 Daughter's are very protective of their father and will be visiting daily while here. Wife will be here in the evenings after work.  Social History Preferred language: English Religion: Pentecostal Cultural Background: No issues Education: Cabin crew Read: Yes Write: Yes Employment Status: Retired Freight forwarder Issues: No issues Guardian/Conservator: None-according to MD pt is capable of making his own decisions while here. Doesn't alwasys hear well so will make sure a family member is here if any decisions need to be made.   Abuse/Neglect Physical Abuse: Denies Verbal Abuse: Denies Sexual Abuse: Denies Exploitation of patient/patient's resources: Denies Self-Neglect: Denies  Emotional Status [name] affect, behavior adn adjustment status: Pt is motivated to do well, he has been having blood sugar issues and is trying  to eat better. Pt has always been independent and able to take care of himself, and wants to get back to this. Over the past few months he has been declining with his leg and the pain it has been causing him. Recent Psychosocial Issues: other health issues-were being managed by PCP Pyschiatric History: No history-deferred depression screen due to coping appropriately and relies upon his faith. He does have a support network of other pastors who he receives support from. His family is also supportive. Will monitor and have neuro-psych see if needed.  Patient / Family Perceptions, Expectations & Goals Pt/Family understanding of illness & functional limitations: Pt and daughter can explain his amputation and the treatment plan. Both are hopful he will do well here and become mobile again. They know it is a process and will take time to heal from his surgery. Both talk with the MD daily and feel their questions are being addressed. Premorbid pt/family roles/activities: Husband, father, grandfather, Theme park manager, Psychologist, occupational, friend, retiree, etc Anticipated changes in roles/activities/participation: resume Pt/family expectations/goals: Pt states: " I want to do as much as I can for myself before I leave here."  Daughter states: " I will assist but know how much he wants to do for himself."  Community [employer] Agencies: None Premorbid Home Care/DME Agencies: Other (Comment) (had in [facility] 2016) Transportation available at discharge: Wife and daughter's Resource referrals recommended: Support group (specify)  Discharge Planning Living Arrangements: Spouse/significant other Support Systems: Spouse/significant other, Children, Other relatives, Friends/neighbors, Social worker community Type of Residence: Private residence Insurance Resources: [employer], Multimedia programmer (specify) ([employer] of [place]) Financial Resources: Family Support, Social Security Financial Screen Referred: No Living Expenses:  Own Money Management: Spouse Does the patient have any problems obtaining your medications?: No Home Management: Wife does home managment Patient/Family Preliminary Plans: Return home with wife and daughter assisting with his care. Daughter can be available while wife is working and wife will be with in the evenings. Pt will have 24 hr care until he is able to do for himself and be asfe alone. Will await team's evaluations and work on a discharge plan. Social Work Anticipated Follow Up Needs: HH/OP, Support Group  Clinical Impression Pleasant frail looking gentleman who is tired from am therapies, wanting to take a nap. His daughter is here to observe him in therapies and provide support, she will be assisting him at home upon discharge. Pt should do well once he gets used to the program and builds some strength back along with stablizing his blood sugars. Daughter aware he will need 24 hr care when first goes home and is willing to do this. Will work on a safe discharge plan. Pt may need rest breaks build into his schedule for him to get maximum benefit here.  Elease Hashimoto 02/11/2016, 1:23 PM

## 2016-02-11 NOTE — Plan of Care (Signed)
Problem: RH PAIN MANAGEMENT Goal: RH STG PAIN MANAGED AT OR BELOW [name] PAIN GOAL Less than 4 Outcome: Progressing No c/o pain

## 2016-02-11 NOTE — Progress Notes (Signed)
Occupational Therapy Session Note  Patient Details  Name: Collin Pearson MRN: [id number] Date of Birth: 1927/04/10  Today's Date: 02/11/2016 OT Individual Time: 1300-1345 OT Individual Time Calculation (min): 45 min    Short Term Goals: Week 1:  OT Short Term Goal 1 (Week 1): Pt will complete UB dressing with Min A OT Short Term Goal 2 (Week 1): Pt will complete LB dressing with Mod A OT Short Term Goal 3 (Week 1): Pt will complete UB bathing with supervision  OT Short Term Goal 4 (Week 1): Pt will complete LB bathing with Mod A  OT Short Term Goal 5 (Week 1): Pt will identify 1 desentization technique for managing pain in L LE  Skilled Therapeutic Interventions/Progress Updates:  Upon entering the room, pt seated in wheelchair with daughter present in room. Pt with 5/10 c/o pain in L residual limb. OT continued education regarding rubbing and tapping of limb in order to decrease pain as well as calling for pain medication at appropriate times in order to participate in therapy. Pt and caregiver verbalized understanding. Pt performed chair push ups x 2 sets of 5 reps with rest breaks as needed. OT also demonstrating B UE strengthening exercises with use of orange, level 2 theraband. Pt returning demonstrations x 10 reps of each exercise with min verbal cues for proper technique. Pt remained in wheelchair with daughter present in the room.    Therapy Documentation Precautions:  Precautions Precautions: Fall Restrictions Weight Bearing Restrictions: Yes LLE Weight Bearing: Non weight bearing Vital Signs: Therapy Vitals Temp: 97.5 F (36.4 C) Temp Source: Oral Pulse Rate: 90 Resp: 19 BP: (!) 148/52 mmHg Patient Position (if appropriate): Sitting Oxygen Therapy SpO2: 100 % O2 Device: Not Delivered  See Function Navigator for Current Functional Status.   Therapy/Group: Individual Therapy  Phineas Semen 02/11/2016, 4:14 PM

## 2016-02-11 NOTE — Progress Notes (Signed)
Initial Nutrition Assessment  DOCUMENTATION CODES:   Non-severe (moderate) malnutrition in context of chronic illness  INTERVENTION:  Provide Glucerna Shake po BID with meals, each supplement provides 220 kcal and 10 grams of protein.  Continue 30 ml Prostat po BID, each supplement provides 100 kcal and 15 grams of protein.   Encourage adequate PO intake.   Food choices discussed.  NUTRITION DIAGNOSIS:   Malnutrition related to chronic illness as evidenced by energy intake < 75% for > or equal to 1 month, moderate depletions of muscle mass.  GOAL:   Patient will meet greater than or equal to 90% of their needs  MONITOR:   PO intake, Supplement acceptance, Weight trends, Labs, I & O's, Skin  REASON FOR ASSESSMENT:   Consult Poor PO  ASSESSMENT:   82 y.o. Male with history of HTN, DM, PAF, CAD with AS s/p CABG with AVR, CKD stage IV, PAD s/p B-carotid stents and history of calcified vessels BLE. He developed gangrenous changes left foot with severe erythema and skin breakdown of pretibial tissue with severe pain X 1 month and AKA recommended due to inability to salvage limb. He was admitted on 06/26 for L-AKA by Dr. Donnetta Hutching. Post op with phantom pain as well as muscle spasms.  Pt reports appetite has been improving today. Meal completion has been 25-75%, with 75% at lunch today. Daughter at bedside reports pt with poor po 1 month PTA with the one week prior to surgery little to no po. Pt with a 6.9% weight loss in 2 months, however weight loss expected after AKA. Pt reports he does consume Boost at home on occasion. Pt is agreeable to Glucerna Shakes at meals to aid in caloric and protein needs. Pt currently has Prostat ordered and has been consuming them. Pt and daughter were additionally educated and food choices and the menu. Pt was encouraged to eat his food at meals.   Nutrition-Focused physical exam completed. Findings are no fat depletion, moderate muscle depletion, and mild  edema.   Labs and medications reviewed.   Diet Order:  Diet Carb Modified Fluid consistency:: Thin; Room service appropriate?: Yes  Skin:  Wound (see comment) (Stage I pressure ulcer on sacrum, L AKA)  Last BM:  6/25  Height:   Ht Readings from Last 1 Encounters:  02/05/16 5' 5.5" (1.664 m)    Weight:   Wt Readings from Last 1 Encounters:  02/11/16 134 lb 8 oz (61.009 kg)    Ideal Body Weight:  58 kg (adjusted for L AKA)  BMI:  Body mass index is 22.03 kg/(m^2).  Estimated Nutritional Needs:   Kcal:  1700-1850  Protein:  75-85 grams  Fluid:  1.7 - 1.8 L/day  EDUCATION NEEDS:   Education needs addressed  Corrin Parker, MS, RD, LDN Pager # [phone] After hours/ weekend pager # [phone]

## 2016-02-11 NOTE — H&P (Addendum)
Addendum to H&P from 02/09/18.     Physical Medicine and Rehabilitation Admission H&P   CC: L-AKA    HPI: Collin Pearson is a 82 y.o. Male with history of HTN, DM, PAF, CAD with AS s/p CABG with AVR, CKD stage IV, PAD s/p B-carotid stents and history of calcified vessels BLE. He developed gangrenous changes left foot with severe erythema and skin breakdown of pretibial tissue with severe pain X 1 month and AKA recommended due to inability to salvage limb. He was admitted on 06/26 for L-AKA by Dr. Donnetta Pearson. Post op with phantom pain as well as muscle spasms. Therapy ongoing and CIR recommended for follow up therapy.   Has been seen by [employer] for prosthetic consult and retention sock ordered.   Review of Systems  HENT: Negative for hearing loss.  Eyes: Negative for blurred vision and double vision.  Respiratory: Negative for cough, shortness of breath and stridor.  Cardiovascular: Negative for chest pain, palpitations and leg swelling.  Gastrointestinal: Negative for heartburn, nausea and constipation.  Genitourinary: Negative for dysuria and urgency.  Musculoskeletal: Positive for myalgias (right heel pain) and falls. Negative for back pain and joint pain.  Skin: Negative for itching and rash.  Neurological: Positive for sensory change and weakness. Negative for dizziness and headaches.  Psychiatric/Behavioral: The patient does not have insomnia.  All other systems reviewed and are negative.  Past Medical History  Diagnosis Date  . Clotting disorder ([name])   . Coronary artery disease   . AS (aortic stenosis)     status post aortic valve replacement with an Collin Pearson [employer] model 39 T. fracture bioprosthesis  . Anginal pain ([place])   . Hypertension   . Diabetes mellitus   . Peripheral vascular disease ([place])   . GERD (gastroesophageal reflux disease)   . CKD (chronic kidney disease) stage 4, GFR 15-29 ml/min (HCC)   . PVD (peripheral  vascular disease), with hsx of bilateral carotid endarterectomies at [facility], and bilateral carotid stenting 05/18/2012  . Heart murmur   . PAF (paroxysmal atrial fibrillation), s/p cabg 06/18/2012  . Critical lower limb ischemia   . Arthritis   . Anxiety      Past Surgical History  Procedure Laterality Date  . Endarterectomy    . Prostate surgery    . Cardiac stents    . Cardiac catheterization    . Coronary artery bypass graft  06/13/2012    Procedure: CORONARY ARTERY BYPASS GRAFTING (CABG); Surgeon: Collin Isaac, MD; Location: [place]; Service: Open Heart Surgery; Laterality: N/A; must start at 76 - appointment at 0800  . Aortic valve replacement  06/13/2012    Procedure: AORTIC VALVE REPLACEMENT (AVR); Surgeon: Collin Isaac, MD; Location: [place]; Service: Open Heart Surgery; Laterality: N/A;  . Cardiac stress test  06/03/2010    Mild anterolateral ischemis  . Left and right heart catheterization with coronary angiogram N/A 05/18/2012    Procedure: LEFT AND RIGHT HEART CATHETERIZATION WITH CORONARY ANGIOGRAM; Surgeon: Collin Harp, MD; Location: [facility] CATH LAB; Service: Cardiovascular; Laterality: N/A;  . Carotid angiogram N/A 05/18/2012    Procedure: CAROTID ANGIOGRAM; Surgeon: Collin Harp, MD; Location: [facility] CATH LAB; Service: Cardiovascular; Laterality: N/A;  . Cardiac valve replacement    . Above knee leg amputation Left 02/08/2016  . Amputation Left 02/08/2016    Procedure: AMPUTATION ABOVE KNEE; Surgeon: Collin Posner, MD; Location: [facility] OR; Service: Vascular; Laterality: Left;    Family History  Problem Relation Age of Onset  .  Heart disease Mother 72  . Heart disease Father 81  . CAD Brother   . Heart disease Maternal Uncle   . Heart disease Paternal Uncle   . Heart disease Maternal Grandfather 68    Social History:  Married. 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 Wife works. Was ambulating with cane when out of home. Has been pushed around on rollater for past few weeks. Per reports that he has never smoked. He has never used smokeless tobacco. He reports that he does not drink alcohol or use illicit drugs.   Allergies: No Known Allergies    Medications Prior to Admission  Medication Sig Dispense Refill  . amLODipine (NORVASC) 10 MG tablet TAKE 1 TABLET DAILY 90 tablet 2  . aspirin EC 81 MG tablet Take 81 mg by mouth 2 (two) times daily.    Marland Kitchen atorvastatin (LIPITOR) 40 MG tablet TAKE 1 TABLET DAILY AT BEDTIME 90 tablet 1  . BD INSULIN SYRINGE ULTRAFINE 31G X 5/16" 1 ML MISC Reported on 02/02/2016    . clotrimazole-betamethasone (LOTRISONE) cream Apply 1 application topically daily as needed (dry skin, itching). Reported on 02/02/2016    . docusate sodium (STOOL SOFTENER) 100 MG capsule Take 100 mg by mouth 3 (three) times a week. Reported on 02/02/2016    . Epoetin Alfa (PROCRIT IJ) Inject 1 Dose as directed as needed. Reported on 02/02/2016    . fish oil-omega-3 fatty acids 1000 MG capsule Take 1 g by mouth 3 (three) times daily. Reported on 02/02/2016    . furosemide (LASIX) 80 MG tablet Take 1 tablet (80 mg total) by mouth daily. Take additional 37m every other day in the evening if weight goes above 134lbs 135 tablet 3  . glipiZIDE (GLUCOTROL) 10 MG tablet Take 10 mg by mouth 2 (two) times daily. Reported on 02/02/2016    . hydrALAZINE (APRESOLINE) 25 MG tablet Take 3 tablets (75 mg total) by mouth 3 (three) times daily. (Patient taking differently: Take 75 mg by mouth daily. ) 60 tablet 3  . Iron TABS Take 160 mg by mouth 2 (two) times daily.     . isosorbide mononitrate (IMDUR) 60 MG 24 hr tablet Take 1 tablet (60 mg total) by mouth daily. 30 tablet 1  . LEVEMIR 100 UNIT/ML injection Inject 20 Units into the skin at bedtime.     . Multiple Vitamin (MULTIVITAMIN WITH MINERALS) TABS Take 1 tablet by mouth daily.    . ONE TOUCH ULTRA TEST test strip Reported on 02/02/2016    . oxyCODONE-acetaminophen (PERCOCET) 10-325 MG tablet Take 1 tablet by mouth every 4 (four) hours as needed for pain.    . pantoprazole (PROTONIX) 40 MG tablet Take 40 mg by mouth daily.    . silver sulfADIAZINE (SILVADENE) 1 % cream Apply 1 application topically as needed (dry skin). Reported on 02/02/2016    . sulfamethoxazole-trimethoprim (BACTRIM DS) 800-160 MG tablet Take 1 tablet by mouth 2 (two) times daily.    . Tamsulosin HCl (FLOMAX) 0.4 MG CAPS Take 0.4 mg by mouth. One tablet in the morning and 1/2 tablet in the evening    . Vitamin D, Ergocalciferol, (DRISDOL) 50000 units CAPS capsule Take 50,000 Units by mouth every 7 (seven) days. Reported on 02/02/2016    . vitamin E (VITAMIN E) 400 UNIT capsule Take 400 Units by mouth daily. Reported on 02/02/2016      Home: Home Living Family/patient expects to be discharged to:: Private residence Living Arrangements: Spouse/significant other Available Help at Discharge:  Family, Available 24 hours/day (wife works during the day, daughter may be able to stay) Type of Home: House Home Access: Stairs to enter [employer] of Steps: 3 Entrance Stairs-Rails: Right, Left Home Layout: One level Bathroom Shower/Tub: Chiropodist: Standard Home Equipment: Environmental consultant - 2 wheels, Environmental consultant - 4 wheels, [employer] - single point, Bedside commode, Tub bench, Wheelchair - manual Lives With: Spouse  Functional History: Prior Function Level of Independence: Needs assistance Gait / Transfers Assistance Needed: Assist with transfers and very little amb in last 2 weeks. Sitting on rollator and family pushing him around. Until 2 weeks ago was amb with occasional use of cane. ADL's / Homemaking Assistance Needed: Assist for all ADLs in past 1-2 weeks,  but prior to that was independent  Functional Status:  Mobility: Bed Mobility Overal bed mobility: Needs Assistance, +2 for physical assistance Bed Mobility: Rolling, Sidelying to Sit Rolling: Mod assist Sidelying to sit: Mod assist, +2 for physical assistance, HOB elevated General bed mobility comments: Mod +2 assist for trunk support to come to sitting and to pivot and scoot hips to EOB. VCs for hand placement. Transfers Overall transfer level: Needs assistance Equipment used: Ambulation equipment used Transfer via Lift Equipment: Stedy Transfers: Sit to/from Stand, Risk manager Sit to Stand: Mod assist, +2 physical assistance Stand pivot transfers: Mod assist, +2 physical assistance General transfer comment: Mod +2 assist for boost to stand and to maintain balance. VCs for hand placement and safety. Pt with R lateral lean while using stedy and required min assist to maintain midline position.      ADL: ADL Overall ADL's : Needs assistance/impaired Eating/Feeding: Set up, Sitting Grooming: Wash/dry hands, Wash/dry face, Set up, Sitting Upper Body Bathing: Set up, Sitting Lower Body Bathing: Moderate assistance, +2 for physical assistance, Sit to/from stand Upper Body Dressing : Set up, Sitting Lower Body Dressing: Moderate assistance, +2 for physical assistance, Sit to/from stand Toilet Transfer: Moderate assistance, +2 for physical assistance, Cueing for safety, BSC (stedy) Toileting- Clothing Manipulation and Hygiene: Moderate assistance, +2 for physical assistance, Sit to/from stand Functional mobility during ADLs: Moderate assistance, +2 for physical assistance (stedy)  Cognition: Cognition Overall Cognitive Status: Within Functional Limits for tasks assessed Orientation Level: Oriented X4 Cognition Arousal/Alertness: Awake/alert Behavior During Therapy: WFL for tasks assessed/performed Overall Cognitive Status: Within Functional Limits for tasks  assessed   Blood pressure 169/59, pulse 83, temperature 97.8 F (36.6 C), temperature source Oral, resp. rate 18, weight 58.015 kg (127 lb 14.4 oz), SpO2 97 %. Physical Exam  Nursing note and vitals reviewed. Constitutional: He is oriented to person, place, and time. He appears well-developed. He is easily aroused.  Frail appearing male. Lethargic-- reported to have pain pill recently.  HENT:  Head: Normocephalic and atraumatic.  Mouth/Throat: Oropharynx is clear and moist. No oropharyngeal exudate.  Eyes: Conjunctivae and EOM are normal. Pupils are equal, round, and reactive to light. No scleral icterus.  Neck: Normal range of motion. Neck supple.  Cardiovascular: Normal rate and regular rhythm. Occasional extrasystoles are present.  Murmur heard. Respiratory: Breath sounds normal. No stridor. He has no wheezes.  GI: Soft. Bowel sounds are normal. He exhibits no distension. There is no tenderness.  Musculoskeletal: He exhibits tenderness (L-AKA sensitive to touch. ). He exhibits no edema.  Moderate serosanguineous drainage on L-AKA dressing.  Right heel red and boggy-- tender to touch.  Neurological: He is oriented to person, place, and time and easily aroused.  Motor: B/l UE, RLE 5/5 proximal  to distal LLE: Hip flexion 5/5  Skin: Skin is warm and dry.  Surgical amputation site c/d/i.  Some ischemic changes to right lower extremity  Psychiatric: He has a normal mood and affect. His behavior is normal.     Lab Results Last 48 Hours    Results for orders placed or performed during the hospital encounter of 02/08/16 (from the past 48 hour(s))  Glucose, capillary Status: Abnormal   Collection Time: 02/08/16 4:17 PM  Result Value Ref Range   Glucose-Capillary 282 (H) 65 - 99 mg/dL  Glucose, capillary Status: Abnormal   Collection Time: 02/08/16 9:12 PM  Result Value Ref Range   Glucose-Capillary 132 (H) 65 - 99 mg/dL  Basic metabolic  panel Status: Abnormal   Collection Time: 02/09/16 3:35 AM  Result Value Ref Range   Sodium 140 135 - 145 mmol/L   Potassium 4.3 3.5 - 5.1 mmol/L   Chloride 110 101 - 111 mmol/L   CO2 21 (L) 22 - 32 mmol/L   Glucose, Bld 83 65 - 99 mg/dL   BUN 48 (H) 6 - 20 mg/dL   Creatinine, Ser 2.34 (H) 0.61 - 1.24 mg/dL   Calcium 8.9 8.9 - 10.3 mg/dL   GFR calc non Af Amer 23 (L) >60 mL/min   GFR calc Af Amer 27 (L) >60 mL/min    Comment: (NOTE) The eGFR has been calculated using the CKD EPI equation. This calculation has not been validated in all clinical situations. eGFR's persistently <60 mL/min signify possible Chronic Kidney Disease.    Anion gap 9 5 - 15  CBC Status: Abnormal   Collection Time: 02/09/16 3:35 AM  Result Value Ref Range   WBC 13.6 (H) 4.0 - 10.5 K/uL   RBC 3.51 (L) 4.22 - 5.81 MIL/uL   Hemoglobin 10.1 (L) 13.0 - 17.0 g/dL   HCT 32.8 (L) 39.0 - 52.0 %   MCV 93.4 78.0 - 100.0 fL   MCH 28.8 26.0 - 34.0 pg   MCHC 30.8 30.0 - 36.0 g/dL   RDW 16.1 (H) 11.5 - 15.5 %   Platelets 273 150 - 400 K/uL  Glucose, capillary Status: Abnormal   Collection Time: 02/09/16 6:06 AM  Result Value Ref Range   Glucose-Capillary 56 (L) 65 - 99 mg/dL  Glucose, capillary Status: None   Collection Time: 02/09/16 6:28 AM  Result Value Ref Range   Glucose-Capillary 84 65 - 99 mg/dL  Glucose, capillary Status: Abnormal   Collection Time: 02/09/16 11:14 AM  Result Value Ref Range   Glucose-Capillary 289 (H) 65 - 99 mg/dL   Comment 1 Notify RN    Comment 2 Document in Chart   Glucose, capillary Status: Abnormal   Collection Time: 02/09/16 4:26 PM  Result Value Ref Range   Glucose-Capillary 283 (H) 65 - 99 mg/dL   Comment 1 Notify RN    Comment 2 Document in Chart   Glucose, capillary Status: Abnormal    Collection Time: 02/09/16 9:17 PM  Result Value Ref Range   Glucose-Capillary 117 (H) 65 - 99 mg/dL  Glucose, capillary Status: Abnormal   Collection Time: 02/10/16 2:13 AM  Result Value Ref Range   Glucose-Capillary 165 (H) 65 - 99 mg/dL  Basic metabolic panel Status: Abnormal   Collection Time: 02/10/16 4:00 AM  Result Value Ref Range   Sodium 137 135 - 145 mmol/L   Potassium 4.2 3.5 - 5.1 mmol/L   Chloride 107 101 - 111 mmol/L   CO2 20 (L) 22 -  32 mmol/L   Glucose, Bld 118 (H) 65 - 99 mg/dL   BUN 41 (H) 6 - 20 mg/dL   Creatinine, Ser 1.94 (H) 0.61 - 1.24 mg/dL   Calcium 8.6 (L) 8.9 - 10.3 mg/dL   GFR calc non Af Amer 29 (L) >60 mL/min   GFR calc Af Amer 34 (L) >60 mL/min    Comment: (NOTE) The eGFR has been calculated using the CKD EPI equation. This calculation has not been validated in all clinical situations. eGFR's persistently <60 mL/min signify possible Chronic Kidney Disease.    Anion gap 10 5 - 15  CBC Status: Abnormal   Collection Time: 02/10/16 4:00 AM  Result Value Ref Range   WBC 11.9 (H) 4.0 - 10.5 K/uL   RBC 3.40 (L) 4.22 - 5.81 MIL/uL   Hemoglobin 9.7 (L) 13.0 - 17.0 g/dL   HCT 31.8 (L) 39.0 - 52.0 %   MCV 93.5 78.0 - 100.0 fL   MCH 28.5 26.0 - 34.0 pg   MCHC 30.5 30.0 - 36.0 g/dL   RDW 15.9 (H) 11.5 - 15.5 %   Platelets 271 150 - 400 K/uL  Glucose, capillary Status: Abnormal   Collection Time: 02/10/16 6:18 AM  Result Value Ref Range   Glucose-Capillary 63 (L) 65 - 99 mg/dL  Glucose, capillary Status: Abnormal   Collection Time: 02/10/16 7:50 AM  Result Value Ref Range   Glucose-Capillary 101 (H) 65 - 99 mg/dL  Glucose, capillary Status: Abnormal   Collection Time: 02/10/16 11:31 AM  Result Value Ref Range   Glucose-Capillary 126 (H) 65 - 99 mg/dL      Imaging Results (Last  48 hours)    No results found.       Medical Problem List and Plan: 1. Abnormality of gait secondary to left AKA. 2. DVT Prophylaxis/Anticoagulation: Pharmaceutical: Lovenox 3. Pain Management: Continue oxycodone prn and robaxin tid. Will add low dose Neurontin to help with neuropathy.  4. Mood: LCSW to follow for evaluation and support.  5. Neuropsych: This patient is capable of making decisions on his own behalf. 6. Skin/Wound Care: Change dressing bid to keep area dry. Will order Prevalon boot for right heel pain.  7. Fluids/Electrolytes/Nutrition: Monitor I/O. Add supplements as intake has been poor.  8. T2DM poor; controlled: Hgb A1c- 8.0. Monitor BS ac/hs. Continue glucotrol bid and Levemir daily with SSI for elevated BS.  9. CAD: Monitor for symptoms with activity. On Imdur, ASA and Lipitor.  10. Leucocytosis: Monitor for signs of infection. Encourage IS.  11. Acute on chronic renal failure: Has improved with hydration. BUN/Cr- 69/4.06 at admission.  12. Acute on chronic anemia: Continue iron supplement. Recheck CBC in am.  13. GERD: Symptoms controlled on protonix.  14. HTN: Monitor BP bid. Continue Norvasc, Furosemide, and Hydralazine.    Post Admission Physician Evaluation: 1. Functional deficits secondary to left AKA. 2. Patient is admitted to receive collaborative, interdisciplinary care between the physiatrist, rehab nursing staff, and therapy team. 3. Patient's level of medical complexity and substantial therapy needs in context of that medical necessity cannot be provided at a lesser intensity of care such as a SNF. 4. Patient has experienced substantial functional loss from his/her baseline which was documented above under the "Functional History" and "Functional Status" headings. Judging by the patient's diagnosis, physical exam, and functional history, the patient has potential for functional progress which will result in measurable gains while on  inpatient rehab. These gains will be of substantial and practical use upon  discharge in facilitating mobility and self-care at the household level. 5. Physiatrist will provide 24 hour management of medical needs as well as oversight of the therapy plan/treatment and provide guidance as appropriate regarding the interaction of the two. 6. 24 hour rehab nursing will assist with bladder management, safety, skin/wound care, disease management, pain management and patient education and help integrate therapy concepts, techniques,education, etc. 7. PT will assess and treat for/with: Lower extremity strength, range of motion, stamina, balance, functional mobility, safety, adaptive techniques and equipment, woundcare, coping skills, pain control, education. Goals are: Supervision/Mod I. 8. OT will assess and treat for/with: ADL's, functional mobility, safety, upper extremity strength, adaptive techniques and equipment, wound mgt, ego support, and community reintegration. Goals are: Supervision/Mod I. Therapy may not proceed with showering this patient. 9. Case Management and Social Worker will assess and treat for psychological issues and discharge planning. 10. Team conference will be held weekly to assess progress toward goals and to determine barriers to discharge. 11. Patient will receive at least 3 hours of therapy per day at least 5 days per week. 12. ELOS: 12-17 days.  13. Prognosis: good  Delice Lesch, MD 02/10/2016

## 2016-02-11 NOTE — Progress Notes (Signed)
Had low BS --nursing followed protocol with BS up to 200s.  Patient has had poor intake for past month with evidence of FTT. Nursing to offer glucerna will meals. Added snack at bedtime to prevent am drop--patient has been sedated and sleeping thorough meals. Glipizide likely culprit with history of CKD--got 10 mg this am. Will set parameters to hold prn BS less than 100 and  monitor with decrease in lantus.

## 2016-02-11 NOTE — Evaluation (Addendum)
Physical Therapy Assessment and Plan  Patient Details  Name: Collin Pearson MRN: [id number] Date of Birth: 10-29-1926  PT Diagnosis:  Difficulty walking, Impaired sensation and Muscle weakness Rehab Potential: Good ELOS: 10-14 days   Today's Date: 02/11/2016 PT Individual Time: 0930-1030 PT Individual Time Calculation (min): 60 min    Problem List:  Patient Active Problem List   Diagnosis Date Noted  . Unilateral AKA ([place]) 02/10/2016  . Gastroesophageal reflux disease with esophagitis   . Anemia of chronic disease   . AKI (acute kidney injury) ([place])   . Status post below knee amputation of left lower extremity ([place])   . Coronary artery disease involving coronary bypass graft of native heart without angina pectoris   . S/P AVR   . Benign essential HTN   . Diabetes mellitus type 2 in nonobese (HCC)   . S/p bilateral carotid endarterectomy   . Chronic kidney disease (CKD), stage IV (severe) ([place])   . Acute blood loss anemia   . Post-operative pain   . Leukocytosis   . PAD (peripheral artery disease) ([place]) 02/08/2016  . Acute on chronic diastolic CHF (congestive heart failure), NYHA class 1 ([place]) 02/09/2015  . Physical deconditioning   . Acute on chronic renal failure ([place]) 02/07/2015  . Atrial fibrillation ([place]) 02/07/2015  . Bradycardia 02/04/2015  . Encounter for nasogastric tube placement   . Endotracheally intubated   . Hypoxia   . Pulmonary edema   . Peripheral arterial disease ([place]) 08/22/2014  . Carotid artery stenosis,Last dopplers 09/20/12 05/29/2013  . Ventral hernia 10/04/2012  . PAF (paroxysmal atrial fibrillation), s/p cabg 06/18/2012  . S/P CABG x 1 with tissue AVR 06/13/12 06/14/2012  . Rib fractures, several, left side  06/11/2012  . CKD (chronic kidney disease) stage 4, GFR 15-29 ml/min (HCC) 06/11/2012  . HTN (hypertension) 06/06/2012  . Dyslipidemia 06/06/2012  . Diabetes mellitus ([place]) 06/06/2012  . NSTEMI (non-ST elevated myocardial infarction) ([place])  06/06/2012  . Chronic anemia 06/06/2012  . Acute pulmonary edema ([place]) 06/06/2012  . PVD, with hx of bilateral CEA at [facility], and previous bilateral carotid stenting, with high grade ISR of Rt. carotid 05/18/12 05/18/2012  . Angina effort ([place]) 05/18/2012  . Clotting disorder ([place])   . CAD, patent stent to LAD and mid RCA with occluded OM2 branch     . AS (aortic stenosis), critical stenosis     Past Medical History:  Past Medical History  Diagnosis Date  . Clotting disorder ([place])   . Coronary artery disease   . AS (aortic stenosis)     status post aortic valve replacement with an [employer] model 32 T. fracture bioprosthesis  . Anginal pain ([place])   . Hypertension   . Diabetes mellitus   . Peripheral vascular disease ([place])   . GERD (gastroesophageal reflux disease)   . CKD (chronic kidney disease) stage 4, GFR 15-29 ml/min (HCC)   . PVD (peripheral vascular disease), with hsx of bilateral carotid endarterectomies at [facility], and bilateral carotid stenting 05/18/2012  . Heart murmur   . PAF (paroxysmal atrial fibrillation), s/p cabg 06/18/2012  . Critical lower limb ischemia   . Arthritis   . Anxiety    Past Surgical History:  Past Surgical History  Procedure Laterality Date  . Endarterectomy    . Prostate surgery    . Cardiac stents    . Cardiac catheterization    . Coronary artery bypass graft  06/13/2012    Procedure: CORONARY ARTERY BYPASS GRAFTING (  CABG);  Surgeon: Grace Isaac, MD;  Location: [place];  Service: Open Heart Surgery;  Laterality: N/A;  must start at 75 - appointment at 0800  . Aortic valve replacement  06/13/2012    Procedure: AORTIC VALVE REPLACEMENT (AVR);  Surgeon: Grace Isaac, MD;  Location: [place];  Service: Open Heart Surgery;  Laterality: N/A;  . Cardiac stress test  06/03/2010    Mild anterolateral ischemis  . Left and right heart catheterization with coronary angiogram N/A 05/18/2012    Procedure: LEFT AND RIGHT HEART  CATHETERIZATION WITH CORONARY ANGIOGRAM;  Surgeon: Lorretta Harp, MD;  Location: [facility] CATH LAB;  Service: Cardiovascular;  Laterality: N/A;  . Carotid angiogram N/A 05/18/2012    Procedure: CAROTID ANGIOGRAM;  Surgeon: Lorretta Harp, MD;  Location: [facility] CATH LAB;  Service: Cardiovascular;  Laterality: N/A;  . Cardiac valve replacement    . Above knee leg amputation Left 02/08/2016  . Amputation Left 02/08/2016    Procedure: AMPUTATION ABOVE KNEE;  Surgeon: Rosetta Posner, MD;  [facility] OR;  Service: Vascular;  Laterality: Left;    Assessment & Plan Clinical Impression: [name] is a 82 y.o. Male with history of HTN, DM, PAF, CAD with AS s/p CABG with AVR, CKD stage IV, PAD s/p B-carotid stents and history of calcified vessels BLE. He developed gangrenous changes left foot with severe erythema and skin breakdown of pretibial tissue with severe pain X 1 month and AKA recommended due to inability to salvage limb. He was admitted on 06/26 for L-AKA by Dr. Donnetta Hutching. Post op with phantom pain as well as muscle spasms. Therapy ongoing and CIR recommended for follow up therapy.   Patient transferred to CIR on 02/10/2016 .   Patient currently requires max with mobility secondary to muscle weakness and muscle joint tightness, decreased cardiorespiratoy endurance and decreased sitting balance, decreased standing balance, decreased postural control, decreased balance strategies and difficulty maintaining precautions.  Prior to hospitalization, patient was modified independent  with mobility and lived with Spouse in a House home.  Home access is 3Stairs to enter.  Patient will benefit from skilled PT intervention to maximize safe functional mobility, minimize fall risk and decrease caregiver burden for planned discharge home with 24 hour supervision.  Anticipate patient will benefit from follow up [place] at discharge.  PT - End of Session Activity Tolerance: Tolerates < 10 min activity, no significant change  in vital signs Endurance Deficit: Yes Endurance Deficit Description: Pt fell asleep during seated rest at end of tx PT Assessment Rehab Potential (ACUTE/IP ONLY): Good Barriers to Discharge: Decreased caregiver support Barriers to Discharge Comments: Wife works full time. Pt has church friends who can check in on him PT Patient demonstrates impairments in the following area(s): Balance;Endurance;Pain;Sensory;Safety;Skin Integrity PT Transfers Functional Problem(s): Bed Mobility;Bed to Chair;Car PT Locomotion Functional Problem(s): Ambulation;Wheelchair Mobility;Stairs PT Plan PT Intensity: Minimum of 1-2 x/day ,45 to 90 minutes PT Frequency: 5 out of 7 days PT Duration Estimated Length of Stay: 10-14 days PT Treatment/Interventions: Ambulation/gait training;Balance/vestibular training;Community reintegration;Cognitive remediation/compensation;Discharge planning;Disease management/prevention;DME/adaptive equipment instruction;Functional mobility training;Neuromuscular re-education;Pain management;Patient/family education;Psychosocial support;Skin care/wound management;Splinting/orthotics;Stair training;Therapeutic Activities;Therapeutic Exercise;UE/LE Strength taining/ROM;Wheelchair propulsion/positioning PT Transfers Anticipated Outcome(s): Mod I basic transfers PT Locomotion Anticipated Outcome(s): S x150' with LRAD PT Recommendation Follow Up Recommendations: Home health PT;24 hour supervision/assistance Patient destination: Home Equipment Recommended: Rolling walker with 5" wheels;Wheelchair cushion (measurements);Other (comment) Equipment Details: Pt has rollator and manual WC, but he may need RW and cushion for skin breakdown  Skilled Therapeutic Intervention  Pt tx initiated upon eval. Daughter present and engaged. Pt oriented to unit, call bell, PT POC, and principles of rehab and amputee care. Pt was educated on fall risk reduction and precautions. Pt instructed in bed mobility,  transfer training for basic stand-pivot transfer, sit<>stands, sitting balance, standing balance training, and gait with RW. Educated pt/family on avoiding supine positioning in bed for skin protection, but pt needed Max A for rolling. Deferred sliding board transfer this tx due to skin breakdown as well as pt's strong tendency to lean posteriorly due to fear of falls. Will continue to re-asess. Pt performed supine therex including 2x10 glute sets and LLE hip ABD/ADD. Bed locked in LE ext position for alignment. Pt engaged in sitting and standing balance with Max A and multi-modal cues for correction by leaning anteriorly over BOS. Pt required up to Max A for lifting and maintaining balance during all mobility, but was able to walk x8' with RW with WC follow. Pt and daughter educated on positioning for optimal joint alignment and preprosthetic trainging. Pt also educated on desensitization. Pt left up with daughter and fell asleep before therapist left the room.    PT Evaluation Precautions/Restrictions Precautions Precautions: Fall Restrictions Weight Bearing Restrictions: Yes LLE Weight Bearing: Non weight bearing General   Vital SignsTherapy Vitals BP: (!) 152/52 mmHg Pain   Home Living/Prior Functioning Home Living Available Help at Discharge: Family;Available 24 hours/day Type of Home: House Home Access: Stairs to enter [employer] of Steps: 3 Entrance Stairs-Rails: Right;Left Home Layout: One level Bathroom Shower/Tub: Product/process development scientist: Standard Bathroom Accessibility: Yes Additional Comments: was using rollator RW with seat two weeks pta  Lives With: Spouse Prior Function  Able to Take Stairs?: Yes Driving: Yes Vocation: Retired Comments: uses cane vs RW depending on how he feels Vision/Perception  Vision - History Baseline Vision: Wears glasses all the time Patient Visual Report: No change from baseline Vision - Assessment Eye Alignment:  Within Functional Limits  Cognition Overall Cognitive Status: Within Functional Limits for tasks assessed Arousal/Alertness: Awake/alert Orientation Level: Oriented X4 Sensation Sensation Light Touch: Impaired Detail Light Touch Impaired Details: Impaired LLE Additional Comments: Pt reports phanotom sensation in LLE Coordination Gross Motor Movements are Fluid and Coordinated: Yes Motor  Motor Motor: Within Functional Limits  Mobility Bed Mobility Bed Mobility: Supine to Sit Supine to Sit: 2: Max assist Supine to Sit Details (indicate cue type and reason): A for rolling R, lowering RLE and lifting trunk Transfers Sit to Stand: 2: Max assist Sit to Stand Details: Manual facilitation for weight shifting;Manual facilitation for placement Stand to Sit: 2: Max assist Stand to Sit Details (indicate cue type and reason): Manual facilitation for weight shifting;Manual facilitation for placement;Manual facilitation for weight bearing;Verbal cues for gait pattern;Verbal cues for precautions/safety;Verbal cues for safe use of DME/AE Stand Pivot Transfers: 2: Max assist Stand Pivot Transfer Details: Manual facilitation for placement;Verbal cues for safe use of DME/AE;Verbal cues for gait pattern;Manual facilitation for weight shifting;Verbal cues for precautions/safety Stand Pivot Transfer Details (indicate cue type and reason): with RW, Max A for maintaining balance and completing turn Locomotion  Ambulation Ambulation/Gait Assistance: 2: Max assist Ambulation Distance (Feet): 8 Feet Assistive device: Rolling walker Ambulation/Gait Assistance Details: Manual facilitation for weight shifting;Manual facilitation for placement;Verbal cues for safe use of DME/AE;Verbal cues for gait pattern;Verbal cues for precautions/safety;Verbal cues for technique;Verbal cues for sequencing Ambulation/Gait Assistance Details: Pt needed assist for steadying and balancing on 1LE due to tendancy to  lean  posteriorly. Pt was able to clear LLE during swing phase, but knee began to buckle with fatigue Stairs / Additional Locomotion Stairs: No Wheelchair Mobility Wheelchair Mobility: Yes Wheelchair Assistance: 4: Min Tour manager: Both upper extremities Wheelchair Parts Management: Needs assistance Distance: 125  Trunk/Postural Assessment  Cervical Assessment Cervical Assessment: Within Functional Limits Thoracic Assessment Thoracic Assessment: Within Functional Limits Lumbar Assessment Lumbar Assessment: Within Functional Limits Postural Control Postural Control: Deficits on evaluation Trunk Control: Pt with strong posterior lean in unsupported sitting and standing due to fear of falling and change in BOS  Balance Balance Balance Assessed: Yes Static Sitting Balance Static Sitting - Balance Support: Bilateral upper extremity supported;Feet supported Static Sitting - Level of Assistance: 3: Mod assist Static Sitting - Comment/# of Minutes: Posterior lean Dynamic Sitting Balance Dynamic Sitting - Balance Support: Bilateral upper extremity supported;Feet supported;During functional activity Dynamic Sitting - Level of Assistance: 2: Max assist Sitting balance - Comments: Needs UE support or assist to maintain static sitting  Static Standing Balance Static Standing - Balance Support: During functional activity;Bilateral upper extremity supported Static Standing - Level of Assistance: 2: Max assist Extremity Assessment  RUE Assessment RUE Assessment: Within Functional Limits LUE Assessment LUE Assessment: Within Functional Limits RLE Assessment RLE Assessment: Exceptions to [facility] (Strength WFL, but decreased knee ext due to tight HS) RLE AROM (degrees) RLE Overall AROM Comments: -5 knee ext, HS tightness LLE Assessment LLE Assessment: Exceptions to WFL (New AKA, )   See Function Navigator for Current Functional Status.   Refer to Care Plan for Long Term  Goals  Recommendations for other services: None  Discharge Criteria: Patient will be discharged from PT if patient refuses treatment 3 consecutive times without medical reason, if treatment goals not met, if there is a change in medical status, if patient makes no progress towards goals or if patient is discharged from hospital.  The above assessment, treatment plan, treatment alternatives and goals were discussed and mutually agreed upon: by patient and by family  Londa Mackowski, Corinna Lines, PT, DPT  02/11/2016, 12:25 PM

## 2016-02-11 NOTE — Progress Notes (Signed)
Physical Therapy Session Note  Patient Details  Name: Collin Pearson MRN: [id number] Date of Birth: [birth year]  Today's Date: 02/11/2016 PT Individual Time: 1510-1540 PT Individual Time Calculation (min): 30 min   Short Term Goals: Week 1:  PT Short Term Goal 1 (Week 1): Pt will roll R/L with Min A for skin protection in bed PT Short Term Goal 2 (Week 1): Pt will move supine<>sit with Min A PT Short Term Goal 3 (Week 1): Pt will perform basic bed<>transfer with consistent Mod A PT Short Term Goal 4 (Week 1): Pt will ambulate 68' with Min A and RW PT Short Term Goal 5 (Week 1): Pt will initiate stair training  Skilled Therapeutic Interventions/Progress Updates:    Pt presented in w/c agreeable to therapy.  Pt propelled approx 169f with min cues for negotiation around obstacles. Standing tolerance trials x3 with mod cues for scooting to edge of w/c, sequencing, and PHP.  Standing tolerance approximately 45sec to 178m max with verbal and tactile cues for achieving and maintaining full knee extension.  Pt with noted fatigue after 3rd trial. PTA pushed w/c back to room 2/2 fatigue.  Pt left in chair with family in room and all current needs met.   Therapy Documentation Precautions:  Precautions Precautions: Fall Restrictions Weight Bearing Restrictions: Yes LLE Weight Bearing: Non weight bearing General:   Vital Signs: Therapy Vitals Temp: 97.5 F (36.4 C) Temp Source: Oral Pulse Rate: 90 Resp: 19 BP: (!) 148/52 mmHg Patient Position (if appropriate): Sitting Oxygen Therapy SpO2: 100 % O2 Device: Not Delivered Pain:   Mobility: Transfers Sit to Stand: 2: Max assist Sit to Stand Details: Manual facilitation for placement;Manual facilitation for weight shifting;Verbal cues for technique;Verbal cues for precautions/safety Locomotion :    Trunk/Postural Assessment : Cervical Assessment Cervical Assessment: Within Functional Limits Thoracic Assessment Thoracic Assessment:  Within Functional Limits Lumbar Assessment Lumbar Assessment: Within Functional Limits Postural Control Postural Control: Deficits on evaluation Trunk Control:  (Posterior leaning observed while standing during ADL completion)  Balance: Balance Balance Assessed: Yes Static Standing Balance Static Standing - Balance Support: Bilateral upper extremity supported Static Standing - Level of Assistance: 2: Max assist Exercises:   Other Treatments:     See Function Navigator for Current Functional Status.   Therapy/Group: Individual Therapy  Marquiz Sotelo  Maleek Craver, PTA  02/11/2016, 3:47 PM

## 2016-02-11 NOTE — Progress Notes (Signed)
[place] PHYSICAL MEDICINE & REHABILITATION     PROGRESS NOTE  Subjective/Complaints:  Pt laying in bed this AM.  He is very HOH.  He states he slept okay, but that his stump feels funny.  ROS: +Incisional pain. Denies CP, SOB, N/V/D.  Objective: Vital Signs: Blood pressure 132/52, pulse 91, temperature 97.6 F (36.4 C), temperature source Oral, resp. rate 18, weight 61.009 kg (134 lb 8 oz), SpO2 99 %. No results found.  Recent Labs  02/10/16 0400 02/11/16 0719  WBC 11.9* 11.7*  HGB 9.7* 10.0*  HCT 31.8* 32.3*  PLT 271 278    Recent Labs  02/09/16 0335 02/10/16 0400  NA 140 137  K 4.3 4.2  CL 110 107  GLUCOSE 83 118*  BUN 48* 41*  CREATININE 2.34* 1.94*  CALCIUM 8.9 8.6*   CBG (last 3)   Recent Labs  02/10/16 1637 02/10/16 2059 02/11/16 0636  GLUCAP 88 166* 62*    Wt Readings from Last 3 Encounters:  02/11/16 61.009 kg (134 lb 8 oz)  02/10/16 58.015 kg (127 lb 14.4 oz)  02/05/16 63.05 kg (139 lb)    Physical Exam:  BP 132/52 mmHg  Pulse 91  Temp(Src) 97.6 F (36.4 C) (Oral)  Resp 18  Wt 61.009 kg (134 lb 8 oz)  SpO2 99% Constitutional:  He appears well-developed. Frail appearing male.   HENT: Normocephalic and atraumatic.  Eyes: Conjunctivae and EOM are normal.   Cardiovascular: Normal rate and regular rhythm.Murmur heard. Respiratory: Breath sounds normal. No stridor. He has no wheezes.  GI: Soft. Bowel sounds are normal. He exhibits no distension. There is no tenderness.  Musculoskeletal: He exhibits tenderness (L-AKA). He exhibits no edema.  Right heel red and boggy-- tender to touch.  Neurological: He is alert and oriented.  Motor: B/l UE, RLE 5/5 proximal to distal LLE: Hip flexion 5/5  Skin: Skin is warm and dry.  Surgical incision c/d/i.  Some ischemic changes to right lower extremity  Sacrum not examined today Psychiatric: He has a normal mood and affect. His behavior is normal.    Assessment/Plan: 1. Functional deficits  secondary to left AKA which require 3+ hours per day of interdisciplinary therapy in a comprehensive inpatient rehab setting. Physiatrist is providing close team supervision and 24 hour management of active medical problems listed below. Physiatrist and rehab team continue to assess barriers to discharge/monitor patient progress toward functional and medical goals.  Function:  Bathing Bathing position      Bathing parts      Bathing assist        Upper Body Dressing/Undressing Upper body dressing                    Upper body assist        Lower Body Dressing/Undressing Lower body dressing                                  Lower body assist        Toileting Toileting          Toileting assist     Transfers Chair/bed transfer             Locomotion Ambulation           Wheelchair          Cognition Comprehension    Expression    Social Interaction    Problem Solving  Memory      Medical Problem List and Plan: 1. Abnormality of gait secondary to left AKA.  Begin CIR 2. DVT Prophylaxis/Anticoagulation: Pharmaceutical: Lovenox 3. Pain Management: Continue oxycodone prn and robaxin tid. Added low dose Neurontin to help with neuropathy.  4. Mood: LCSW to follow for evaluation and support.  5. Neuropsych: This patient is capable of making decisions on his own behalf. 6. Skin/Wound Care:   Change dressing bid to keep area dry.   Ordered Prevalon boot for right heel pain.   Pressure relief to sacrum 7. Fluids/Electrolytes/Nutrition: Monitor I/O. Added supplements as intake has been poor.  8. T2DM poor; controlled: Hgb A1c- 8.0. Monitor BS ac/hs.   Continue glucotrol bid   Levemir decreased to 15U qhs on 6/29  Monitor with increased activity 9. CAD: Monitor for symptoms with activity. On Imdur, ASA and Lipitor.  10. Leucocytosis: Monitor for signs of infection. Encourage IS.  11. Acute on chronic renal failure: Has  improved with hydration. BUN/Cr- 69/4.06 at admission.  12. Acute on chronic anemia: Continue iron supplement.   Hb 10.0 on 6/29  Will cont to monitor 13. GERD: Symptoms controlled on protonix.  14. HTN: Monitor BP bid. Continue Norvasc, Furosemide, and Hydralazine.   1 significantly elevated recording overnight, likely erroneous 15. Leukocytosis  Afebrile, ?trending down  WBCs 11.7 on 6/29  Will cont to monitor  LOS (Days) 1 A FACE TO FACE EVALUATION WAS PERFORMED  Ankit Lorie Phenix 02/11/2016 8:23 AM

## 2016-02-12 ENCOUNTER — Inpatient Hospital Stay (HOSPITAL_COMMUNITY): Payer: BLUE CROSS/BLUE SHIELD | Admitting: Occupational Therapy

## 2016-02-12 ENCOUNTER — Inpatient Hospital Stay (HOSPITAL_COMMUNITY): Payer: BLUE CROSS/BLUE SHIELD

## 2016-02-12 ENCOUNTER — Inpatient Hospital Stay (HOSPITAL_COMMUNITY): Payer: BLUE CROSS/BLUE SHIELD | Admitting: Physical Therapy

## 2016-02-12 DIAGNOSIS — Z89612 Acquired absence of left leg above knee: Secondary | ICD-10-CM

## 2016-02-12 DIAGNOSIS — I10 Essential (primary) hypertension: Secondary | ICD-10-CM

## 2016-02-12 DIAGNOSIS — D62 Acute posthemorrhagic anemia: Secondary | ICD-10-CM

## 2016-02-12 DIAGNOSIS — E119 Type 2 diabetes mellitus without complications: Secondary | ICD-10-CM

## 2016-02-12 DIAGNOSIS — D72829 Elevated white blood cell count, unspecified: Secondary | ICD-10-CM

## 2016-02-12 LAB — GLUCOSE, CAPILLARY
GLUCOSE-CAPILLARY: 180 mg/dL — AB (ref 65–99)
Glucose-Capillary: 148 mg/dL — ABNORMAL HIGH (ref 65–99)
Glucose-Capillary: 158 mg/dL — ABNORMAL HIGH (ref 65–99)
Glucose-Capillary: 201 mg/dL — ABNORMAL HIGH (ref 65–99)
Glucose-Capillary: 217 mg/dL — ABNORMAL HIGH (ref 65–99)

## 2016-02-12 MED ORDER — HYDRALAZINE HCL 50 MG PO TABS
75.0000 mg | ORAL_TABLET | Freq: Two times a day (BID) | ORAL | Status: DC
  Administered 2016-02-12 – 2016-02-14 (×5): 75 mg via ORAL
  Filled 2016-02-12 (×6): qty 1

## 2016-02-12 MED ORDER — FUROSEMIDE 40 MG PO TABS
80.0000 mg | ORAL_TABLET | Freq: Every day | ORAL | Status: DC
  Administered 2016-02-13 – 2016-02-25 (×13): 80 mg via ORAL
  Filled 2016-02-12 (×13): qty 2

## 2016-02-12 MED ORDER — AMLODIPINE BESYLATE 10 MG PO TABS
10.0000 mg | ORAL_TABLET | Freq: Every day | ORAL | Status: DC
  Administered 2016-02-13 – 2016-02-25 (×13): 10 mg via ORAL
  Filled 2016-02-12 (×13): qty 1

## 2016-02-12 NOTE — Progress Notes (Signed)
Occupational Therapy Session Note  Patient Details  Name: Collin Pearson MRN: [id number] Date of Birth: January 30, 1927  Today's Date: 02/12/2016 OT Individual Time: 1402-1447  45 minutes      Short Term Goals: Week 1:  OT Short Term Goal 1 (Week 1): Pt will complete UB dressing with Min A OT Short Term Goal 2 (Week 1): Pt will complete LB dressing with Mod A OT Short Term Goal 3 (Week 1): Pt will complete UB bathing with supervision  OT Short Term Goal 4 (Week 1): Pt will complete LB bathing with Mod A  OT Short Term Goal 5 (Week 1): Pt will identify 1 desentization technique for managing pain in L LE  Skilled Therapeutic Interventions/Progress Updates:    Pt participated in self care mgt retraining focusing on improving standing endurance and assessment of toilet transfer. Pt able to stand at sink with bilateral support and moderate assistance with manual cuing for maintaining anterior lean due to pt tendency to lean backwards, as well as verbal cuing to extend L LE. Longest standing time without rest or LOB 5 minutes 31 seconds. Pt completed functional toilet transfer with use of grab bars in bathroom stand pivot with standard toilet, moderate assistance and verbal cues for proper hand placement. Pt reported needing to void, and completed toileting tasks with Max A, standing as needed with bilateral support on grab bars. Pt returned to w/c with moderate assistance to sit up and visit with friends and family in room. Pt left in w/c with family after washing hands at sink. No c/o pain today. Daughter reported that pt is incorporating L LE desensitization techniques into morning routine.   Therapy Documentation Precautions:  Precautions Precautions: Fall Restrictions Weight Bearing Restrictions: Yes (left AKA) LLE Weight Bearing: Weight bearing as tolerated General:   Vital Signs:  Pain: Pain Assessment Pain Assessment: 0-10 Pain Score: 2  Pain Type: Surgical pain Pain Location:  Leg Pain Orientation: Left Pain Intervention(s): Repositioned ADL: ADL Eating: Modified independent Where Assessed-Eating: Wheelchair Grooming: Setup Where Assessed-Grooming: Wheelchair Upper Body Bathing: Minimal assistance Where Assessed-Upper Body Bathing: Wheelchair Lower Body Bathing: Moderate assistance Where Assessed-Lower Body Bathing: Wheelchair Upper Body Dressing: Moderate assistance Where Assessed-Upper Body Dressing: Wheelchair Lower Body Dressing: Other (Comment) (2 helpers) Where Assessed-Lower Body Dressing: Wheelchair Toileting: Not assessed Toilet Transfer: Not assessed Toilet Transfer Method: Unable to assess Tub/Shower Transfer: Not assessed     See Function Navigator for Current Functional Status.   Therapy/Group: Individual Therapy  Link Burgeson A Nicole Defino 02/12/2016, 3:01 PM

## 2016-02-12 NOTE — Progress Notes (Signed)
[place] PHYSICAL MEDICINE & REHABILITATION     PROGRESS NOTE  Subjective/Complaints:  Pt sitting up in bed eating breakfast.  He states he slept better and had a first day of therapies.   ROS: +Incisional pain. Denies CP, SOB, N/V/D.  Objective: Vital Signs: Blood pressure 178/62, pulse 77, temperature 97.7 F (36.5 C), temperature source Oral, resp. rate 16, weight 59.6 kg (131 lb 6.3 oz), SpO2 100 %. No results found.  Recent Labs  02/10/16 0400 02/11/16 0719  WBC 11.9* 11.7*  HGB 9.7* 10.0*  HCT 31.8* 32.3*  PLT 271 278    Recent Labs  02/10/16 0400 02/11/16 0719  NA 137 136  K 4.2 4.1  CL 107 108  GLUCOSE 118* 43*  BUN 41* 41*  CREATININE 1.94* 1.92*  CALCIUM 8.6* 8.8*   CBG (last 3)   Recent Labs  02/11/16 0911 02/11/16 1135 02/11/16 1645  GLUCAP 205* 300* 235*    Wt Readings from Last 3 Encounters:  02/12/16 59.6 kg (131 lb 6.3 oz)  02/10/16 58.015 kg (127 lb 14.4 oz)  02/05/16 63.05 kg (139 lb)    Physical Exam:  BP 178/62 mmHg  Pulse 77  Temp(Src) 97.7 F (36.5 C) (Oral)  Resp 16  Wt 59.6 kg (131 lb 6.3 oz)  SpO2 100% Constitutional:  He appears well-developed. Frail appearing male.   HENT: Normocephalic and atraumatic.  Eyes: Conjunctivae and EOM are normal.   Cardiovascular: Normal rate and regular rhythm.Murmur heard. Respiratory: Breath sounds normal. No stridor. He has no wheezes.  GI: Soft. Bowel sounds are normal. He exhibits no distension. There is no tenderness.  Musculoskeletal: He exhibits tenderness (L-AKA). He exhibits no edema.  Right heel red and boggy-- tender to touch.  Neurological: He is alert and oriented.  Motor: B/l UE, RLE 5/5 proximal to distal LLE: Hip flexion 5/5  Skin: Skin is warm and dry.  Surgical incision with sanguinous drainage.  Some ischemic changes to right lower extremity  Sacrum not examined today Psychiatric: He has a normal mood and affect. His behavior is normal.     Assessment/Plan: 1. Functional deficits secondary to left AKA which require 3+ hours per day of interdisciplinary therapy in a comprehensive inpatient rehab setting. Physiatrist is providing close team supervision and 24 hour management of active medical problems listed below. Physiatrist and rehab team continue to assess barriers to discharge/monitor patient progress toward functional and medical goals.  Function:  Bathing Bathing position   Position: Wheelchair/chair at sink  Bathing parts Body parts bathed by patient: Right arm, Left arm, Chest, Abdomen, Front perineal area, Right upper leg, Right lower leg Body parts bathed by helper: Buttocks, Back  Bathing assist Assist Level: Touching or steadying assistance(Pt > 75%)      Upper Body Dressing/Undressing Upper body dressing   What is the patient wearing?: Button up shirt         Button up shirt - Perfomed by patient: Thread/unthread right sleeve Button up shirt - Perfomed by helper: Thread/unthread left sleeve, Pull shirt around back, Button/unbutton shirt    Upper body assist Assist Level: Touching or steadying assistance(Pt > 75%)      Lower Body Dressing/Undressing Lower body dressing   What is the patient wearing?: Socks, Pants     Pants- Performed by patient: Thread/unthread right pants leg Pants- Performed by helper: Thread/unthread left pants leg, Pull pants up/down       Socks - Performed by helper: Don/doff right sock  Lower body assist Assist for lower body dressing: 2 Helpers      Toileting Toileting     Toileting steps completed by helper: Adjust clothing prior to toileting, Performs perineal hygiene, Adjust clothing after toileting    Toileting assist Assist level: Two helpers   Transfers Chair/bed transfer   Chair/bed transfer method: Stand pivot Chair/bed transfer assist level: Maximal assist (Pt 25 - 49%/lift and lower) Chair/bed transfer assistive device: Armrests,  Medical sales representative     Max distance: 8 Assist level: Maximal assist (Pt 25 - 49%)   Wheelchair   Type: Manual Max wheelchair distance: 125 Assist Level: Touching or steadying assistance (Pt > 75%)  Cognition Comprehension Comprehension assist level: Understands basic 90% of the time/cues < 10% of the time  Expression Expression assist level: Expresses basic 90% of the time/requires cueing < 10% of the time.  Social Interaction Social Interaction assist level: Interacts appropriately 90% of the time - Needs monitoring or encouragement for participation or interaction.  Problem Solving Problem solving assist level: Solves basic 75 - 89% of the time/requires cueing 10 - 24% of the time  Memory Memory assist level: Recognizes or recalls 90% of the time/requires cueing < 10% of the time    Medical Problem List and Plan: 1. Abnormality of gait secondary to left AKA.  Cont CIR 2. DVT Prophylaxis/Anticoagulation: Pharmaceutical: Lovenox 3. Pain Management: Continue oxycodone prn and robaxin tid. Added low dose Neurontin to help with neuropathy.  4. Mood: LCSW to follow for evaluation and support.  5. Neuropsych: This patient is capable of making decisions on his own behalf. 6. Skin/Wound Care:   Change dressing bid to keep area dry.   Ordered Prevalon boot for right heel pain.   Pressure relief to sacrum 7. Fluids/Electrolytes/Nutrition: Monitor I/O. Added supplements as intake has been poor.  8. T2DM poor; controlled: Hgb A1c- 8.0. Monitor BS ac/hs.   Continue glucotrol bid   Levemir decreased to 15U qhs on 6/29  Monitor with increased activity, labile at present, will monitor for trend 9. CAD: Monitor for symptoms with activity. On Imdur, ASA and Lipitor.  10. Leucocytosis: Monitor for signs of infection. Encourage IS.  11. Acute on chronic renal failure: Has improved with hydration. BUN/Cr- 69/4.06 at admission.  12. Acute on chronic anemia: Continue iron  supplement.   Hb 10.0 on 6/29  Labs ordered for Monday 13. GERD: Symptoms controlled on protonix.  14. HTN: Monitor BP.   Continue Norvasc  Cont Furosemide  Due to AM elevations, Hydralazine increased to 75mg  BID on 6/30.  15. Leukocytosis  Afebrile, ?trending down  WBCs 11.7 on 6/29  Labs ordered for Monday  LOS (Days) 2 A FACE TO FACE EVALUATION WAS PERFORMED  Parilee Hally Lorie Phenix 02/12/2016 8:27 AM

## 2016-02-12 NOTE — Progress Notes (Signed)
Physical Therapy Session Note  Patient Details  Name: Collin Pearson MRN: [id number] Date of Birth: May 15, 1927  Today's Date: 02/12/2016 PT Individual Time: 1300-1400 PT Individual Time Calculation (min): 60 min   Short Term Goals: Week 1:  PT Short Term Goal 1 (Week 1): Pt will roll R/L with Min A for skin protection in bed PT Short Term Goal 2 (Week 1): Pt will move supine<>sit with Min A PT Short Term Goal 3 (Week 1): Pt will perform basic bed<>transfer with consistent Mod A PT Short Term Goal 4 (Week 1): Pt will ambulate 82' with Min A and RW PT Short Term Goal 5 (Week 1): Pt will initiate stair training  Skilled Therapeutic Interventions/Progress Updates:    Pt received seated in w/c, c/o pain as described below and agreeable to treatment. W/c propulsion x75' with BUE and S, increased time due to UE strength deficits, and min cues for hand placement for more efficient propulsion. Pt frequently distracted by engaging himself in conversation with therapist; requires cues to maintain attention to task. Car transfer performed stand pivot with RW and maxA with poor foot clearance from floor. Sit <>stand in parallel bars with modA, however upon standing, soiled ace wrap and dressing from LLE residual fell off. Returned pt to room; transferred squat pivot +2A due to LLE unwrapped and therapist wanted to reduce risk of injury to residual. Sit <>supine with S and significantly increased time due to poor initiation, attention. Dons/doffs pants with minA and max cues for attention to task and technique. Therapist and nurse applied new dressing to LLE. Returned to sitting as above. Transfer bed>w/c with RW and stand pivot maxA. Remained seated in w/c at completion of session, daughter present and all needs within reach.   Therapy Documentation Precautions:  Precautions Precautions: Fall Restrictions Weight Bearing Restrictions: Yes (left AKA) LLE Weight Bearing: Weight bearing as  tolerated Pain: Pain Assessment Pain Assessment: 0-10 Pain Score: 2  Pain Type: Surgical pain Pain Location: Leg Pain Orientation: Left Pain Intervention(s): Repositioned   See Function Navigator for Current Functional Status.   Therapy/Group: Individual Therapy  Luberta Mutter 02/12/2016, 2:02 PM

## 2016-02-12 NOTE — Progress Notes (Signed)
      Sitting up in [facility] in street cloths Nursing staff states stump has normal color with one area of pin point bleeding after working with PT.  No active bleeding  S/P left AKA Progressing well with in patient Rehab  Theda Sers, Ivis Nicolson MAUREEN PA-C

## 2016-02-12 NOTE — Progress Notes (Signed)
Occupational Therapy Session Note  Patient Details  Name: Collin Pearson MRN: [id number] Date of Birth: 1926/12/24  Today's Date: 02/12/2016 OT Individual Time: 1100-1200 OT Individual Time Calculation (min): 60 min    Short Term Goals: Week 1:  OT Short Term Goal 1 (Week 1): Pt will complete UB dressing with Min A OT Short Term Goal 2 (Week 1): Pt will complete LB dressing with Mod A OT Short Term Goal 3 (Week 1): Pt will complete UB bathing with supervision  OT Short Term Goal 4 (Week 1): Pt will complete LB bathing with Mod A  OT Short Term Goal 5 (Week 1): Pt will identify 1 desentization technique for managing pain in L LE  Skilled Therapeutic Interventions/Progress Updates:    Pt seen this session to address sit to stand and standing tolerance/balance skills needed for self care. [name] dtr present.  His L limb bandage needed rewrapping, once removed massage over limb with pt self massaging also.  Limb rewrapped with ace wrap as sock difficult to don with pants already on. Education with pt and dtr on safe sit to stand techniques focusing on R foot placement, hand placement and sequencing of tasks.  Pt needed max cues as he is accustomed to having R foot out and fully pulling up on walker with assist to hold the walker. Pt tolerated standing for 1 min 2x with mod A.  Pt keeps R knee flexed (per dtr, he has done this for a long time).  Pt and dtr taken to ADL apt bathroom to problem solve bathing options for home. She will photograph the bathroom for the therapy team. They have a shower chair and a tub bench as options. Pt in room with all needs met.  Therapy Documentation Precautions:  Precautions Precautions: Fall Restrictions Weight Bearing Restrictions: Yes (left AKA) LLE Weight Bearing: Weight bearing as tolerated    Vital Signs: Therapy Vitals Pulse Rate: 86 Resp: 18 BP: (!) 136/59 mmHg Patient Position (if appropriate): Sitting Oxygen Therapy SpO2: 97 % O2 Device: Not  Delivered Pain: Pain Assessment Pain Assessment: 0-10 Pain Score: 2  Pain Type: Surgical pain Pain Location: Leg Pain Orientation: Left Pain Intervention(s): Repositioned ADL: ADL Eating: Modified independent Where Assessed-Eating: Wheelchair Grooming: Setup Where Assessed-Grooming: Wheelchair Upper Body Bathing: Minimal assistance Where Assessed-Upper Body Bathing: Wheelchair Lower Body Bathing: Moderate assistance Where Assessed-Lower Body Bathing: Wheelchair Upper Body Dressing: Moderate assistance Where Assessed-Upper Body Dressing: Wheelchair Lower Body Dressing: Other (Comment) (2 helpers) Where Assessed-Lower Body Dressing: Wheelchair Toileting: Not assessed Toilet Transfer: Not assessed Toilet Transfer Method: Unable to assess Tub/Shower Transfer: Not assessed  See Function Navigator for Current Functional Status.   Therapy/Group: Individual Therapy  SAGUIER,JULIA 02/12/2016, 1:15 PM

## 2016-02-12 NOTE — Progress Notes (Signed)
Occupational Therapy Session Note  Patient Details  Name: Collin Pearson MRN: [id number] Date of Birth: 06-02-1927  Today's Date: 02/12/2016 OT Individual Time: 0900-1000 OT Individual Time Calculation (min): 60 min    Short Term Goals: Week 1:  OT Short Term Goal 1 (Week 1): Pt will complete UB dressing with Min A OT Short Term Goal 2 (Week 1): Pt will complete LB dressing with Mod A OT Short Term Goal 3 (Week 1): Pt will complete UB bathing with supervision  OT Short Term Goal 4 (Week 1): Pt will complete LB bathing with Mod A  OT Short Term Goal 5 (Week 1): Pt will identify 1 desentization technique for managing pain in L LE  Skilled Therapeutic Interventions/Progress Updates:    Pt in bed on bedpan upon arrival.  Pt agreeable to engaging in BADLs at w/c level at sink. [name] daughter present. Pt required max A for bed mobility (rolling and sitting EOB). Pt exhibits significant posterior lean with sitting and when initiating sit<>stand.  Pt required tot A for squat pivot transfer to w/c.  Pt completed UB bathing and most of LB bathing tasks when he stated he needed to have a BM.  Pt transferred to [facility] with Stedy + 2.  Pt exhibited significant difficulty maintaining erect posture to allow seat of Stedy to be lowered.  Pt remained on BSC with NT present at end of session.  Focus on activity tolerance, sit<>stand, functional transfers, sitting balance, BADL retraining, and safety awareness to increase independence with BADLs.  Therapy Documentation Precautions:  Precautions Precautions: Fall Restrictions Weight Bearing Restrictions: Yes (left AKA) LLE Weight Bearing: Non weight bearing  Pain:  Pt c/o pain in Left Hip with movement; unrated; repositioned  See Function Navigator for Current Functional Status.   Therapy/Group: Individual Therapy  Leroy Libman 02/12/2016, 10:01 AM

## 2016-02-13 ENCOUNTER — Inpatient Hospital Stay (HOSPITAL_COMMUNITY): Payer: BLUE CROSS/BLUE SHIELD | Admitting: Physical Therapy

## 2016-02-13 ENCOUNTER — Inpatient Hospital Stay (HOSPITAL_COMMUNITY): Payer: BLUE CROSS/BLUE SHIELD

## 2016-02-13 ENCOUNTER — Telehealth: Payer: Self-pay | Admitting: Vascular Surgery

## 2016-02-13 ENCOUNTER — Inpatient Hospital Stay (HOSPITAL_COMMUNITY): Payer: BLUE CROSS/BLUE SHIELD | Admitting: *Deleted

## 2016-02-13 DIAGNOSIS — E1165 Type 2 diabetes mellitus with hyperglycemia: Secondary | ICD-10-CM

## 2016-02-13 LAB — GLUCOSE, CAPILLARY: Glucose-Capillary: 116 mg/dL — ABNORMAL HIGH (ref 65–99)

## 2016-02-13 NOTE — Telephone Encounter (Signed)
Sched appt 7/25 at 8:45. Spoke to pt's daughter to inform them of appt.

## 2016-02-13 NOTE — Progress Notes (Signed)
[place] PHYSICAL MEDICINE & REHABILITATION     PROGRESS NOTE  Subjective/Complaints:  RN reports increased bloody drainage from stump. Pt states it's not much more than usual.   ROS: +Incisional pain. Denies CP, SOB, N/V/D.  Objective: Vital Signs: Blood pressure 116/50, pulse 86, temperature 98 F (36.7 C), temperature source Oral, resp. rate 18, weight 58.5 kg (128 lb 15.5 oz), SpO2 99 %. No results found.  Recent Labs  02/11/16 0719  WBC 11.7*  HGB 10.0*  HCT 32.3*  PLT 278    Recent Labs  02/11/16 0719  NA 136  K 4.1  CL 108  GLUCOSE 43*  BUN 41*  CREATININE 1.92*  CALCIUM 8.8*   CBG (last 3)   Recent Labs  02/12/16 1145 02/12/16 1635 02/13/16 0644  GLUCAP 180* 217* 116*    Wt Readings from Last 3 Encounters:  02/13/16 58.5 kg (128 lb 15.5 oz)  02/10/16 58.015 kg (127 lb 14.4 oz)  02/05/16 63.05 kg (139 lb)    Physical Exam:  BP 116/50 mmHg  Pulse 86  Temp(Src) 98 F (36.7 C) (Oral)  Resp 18  Wt 58.5 kg (128 lb 15.5 oz)  SpO2 99% Constitutional:  He appears well-developed. Frail appearing male.   HENT: Normocephalic and atraumatic.  Eyes: Conjunctivae and EOM are normal.   Cardiovascular: Normal rate and regular rhythm.Murmur heard. Respiratory: Breath sounds normal. No stridor. He has no wheezes.  GI: Soft. Bowel sounds are normal. He exhibits no distension. There is no tenderness.  Musculoskeletal: He exhibits tenderness (L-AKA). He exhibits no edema.  Right heel red and boggy-- tender to touch.  Neurological: He is alert and oriented.  Motor: B/l UE, RLE 5/5 proximal to distal LLE: Hip flexion 5/5  Skin: Skin is warm and dry.  Surgical incision with sanguinous drainage. A few scabs on incision. Surrounding skin clean without erythema or warmth..  Some ischemic changes to right lower extremity  Sacrum not examined today Psychiatric: He has a normal mood and affect. His behavior is normal.    Assessment/Plan: 1. Functional  deficits secondary to left AKA which require 3+ hours per day of interdisciplinary therapy in a comprehensive inpatient rehab setting. Physiatrist is providing close team supervision and 24 hour management of active medical problems listed below. Physiatrist and rehab team continue to assess barriers to discharge/monitor patient progress toward functional and medical goals.  Function:  Bathing Bathing position   Position: Wheelchair/chair at sink  Bathing parts Body parts bathed by patient: Right arm, Left arm, Chest, Abdomen, Front perineal area, Right upper leg, Right lower leg, Left upper leg Body parts bathed by helper: Buttocks  Bathing assist Assist Level: 2 helpers      Upper Body Dressing/Undressing Upper body dressing   What is the patient wearing?: Button up shirt         Button up shirt - Perfomed by patient: Thread/unthread left sleeve, Thread/unthread right sleeve, Button/unbutton shirt Button up shirt - Perfomed by helper: Pull shirt around back    Upper body assist Assist Level: Touching or steadying assistance(Pt > 75%)      Lower Body Dressing/Undressing Lower body dressing Lower body dressing/undressing activity did not occur: N/A What is the patient wearing?: Underwear, Pants, Non-skid slipper socks Underwear - Performed by patient: Thread/unthread right underwear leg, Thread/unthread left underwear leg Underwear - Performed by helper: Thread/unthread left underwear leg, Pull underwear up/down Pants- Performed by patient: Thread/unthread right pants leg Pants- Performed by helper: Thread/unthread left pants leg, Pull pants  up/down   Non-skid slipper socks- Performed by helper: Don/doff right sock   Socks - Performed by helper: Don/doff right sock              Lower body assist Assist for lower body dressing: 2 Helpers      Toileting Toileting     Toileting steps completed by helper: Adjust clothing prior to toileting, Performs perineal hygiene,  Adjust clothing after toileting    Toileting assist Assist level: Two helpers   Transfers Chair/bed transfer   Chair/bed transfer method: Stand pivot Chair/bed transfer assist level: Maximal assist (Pt 25 - 49%/lift and lower) Chair/bed transfer assistive device: Armrests, Medical sales representative     Max distance: 8 Assist level: Maximal assist (Pt 25 - 49%)   Wheelchair   Type: Manual Max wheelchair distance: 75 Assist Level: Supervision or verbal cues  Cognition Comprehension Comprehension assist level: Follows basic conversation/direction with no assist  Expression Expression assist level: Expresses basic needs/ideas: With no assist  Social Interaction Social Interaction assist level: Interacts appropriately 90% of the time - Needs monitoring or encouragement for participation or interaction.  Problem Solving Problem solving assist level: Solves basic 75 - 89% of the time/requires cueing 10 - 24% of the time  Memory Memory assist level: Recognizes or recalls 90% of the time/requires cueing < 10% of the time    Medical Problem List and Plan: 1. Abnormality of gait secondary to left AKA.  Cont CIR 2. DVT Prophylaxis/Anticoagulation: Pharmaceutical: Lovenox 3. Pain Management: Continue oxycodone prn and robaxin tid. Added low dose Neurontin to help with neuropathy.  4. Mood: LCSW to follow for evaluation and support.  5. Neuropsych: This patient is capable of making decisions on his own behalf. 6. Skin/Wound Care:   Continue dressing bid to keep area dry. Add telfa so that gauze doesn't adhere to scab/staples  Prevalon boot for right heel pain.   Pressure relief to sacrum 7. Fluids/Electrolytes/Nutrition: Monitor I/O. Added supplements as intake has been poor.  8. T2DM poor; controlled: Hgb A1c- 8.0. Monitor BS ac/hs.   Continue glucotrol bid   Levemir decreased to 15U qhs on 6/29  Still somewhat labile. May need to increase again. ?bid dosing. cbg 116  this am 9. CAD: Monitor for symptoms with activity. On Imdur, ASA and Lipitor.  10. Leucocytosis: Monitor for signs of infection. Encourage IS.  11. Acute on chronic renal failure: Has improved with hydration. BUN/Cr- 69/4.06 at admission.  12. Acute on chronic anemia: Continue iron supplement.   Hb 10.0 on 6/29  Labs ordered for Monday 13. GERD: Symptoms controlled on protonix.  14. HTN: Monitor BP.   Continue Norvasc  Cont Furosemide  Due to AM elevations, Hydralazine increased to 75mg  BID on 6/30.  15. Leukocytosis  Afebrile, ?trending down  WBCs 11.7 on 6/29  Labs ordered for Monday  LOS (Days) 3 A FACE TO FACE EVALUATION WAS PERFORMED  SWARTZ,ZACHARY T 02/13/2016 9:39 AM

## 2016-02-13 NOTE — Progress Notes (Signed)
Occupational Therapy Note  Patient Details  Name: Collin Pearson MRN: [id number] Date of Birth: 02-Nov-1926  Today's Date: 02/13/2016 OT Individual Time: 1300-1330 OT Individual Time Calculation (min): 30 min   Pt denied pain Individual therapy  Pt resting in bed with daughter present.  Pt required mod A for supine>sit EOB with max multimodal cues for sequencing.  Pt initially engaged in sit<>stand from EOB with focus on anterior lean and erect posture during standing.  Pt practiced squat pivot transfer X 2 requiring max A.  Pt remained in w/c with all needs within reach and daughter present.    Leotis Shames [facility] 02/13/2016, 1:36 PM

## 2016-02-13 NOTE — Progress Notes (Signed)
Physical Therapy Session Note  Patient Details  Name: Collin Pearson MRN: [id number] Date of Birth: November 22, 1926  Today's Date: 02/13/2016 PT Individual Time: [id number] PT Individual Time Calculation (min): 45 min   Short Term Goals: Week 1:  PT Short Term Goal 1 (Week 1): Pt will roll R/L with Min A for skin protection in bed PT Short Term Goal 2 (Week 1): Pt will move supine<>sit with Min A PT Short Term Goal 3 (Week 1): Pt will perform basic bed<>transfer with consistent Mod A PT Short Term Goal 4 (Week 1): Pt will ambulate 71' with Min A and RW PT Short Term Goal 5 (Week 1): Pt will initiate stair training  Skilled Therapeutic Interventions/Progress Updates:    Patient received sitting in WC and agreeable to PT.  PT instructed patient in [facility] mobility for 165ft with supervision A and min cues for obstacle navigation and to maintain straight trajectory.  Patient performed transfer training including stand pivot transfer with Max A from PT and mod cues for improved UE positioning and increased control of descent.  Stand<>sit x 2 once with RW, once without AD. Max A from PT for both sit<>stand transfers and mod cues for.  SB transfers x 5 to and from [facility] with min A from PT and mod-max cues for positioning and sequencing. Pt required mod multimodal cues to proper positioning prior to transfer and encouragement to come to sitting EOB due to fear of falling off mat table Scooting EOB x 12 throughout treatment with min A from PT as well multimodal cues for improved use of RLE and BUE to lift and scoot to prevent sheer forces on bottom.   Patient returned to room and left sitting in [facility] with call bell in reach.   Therapy Documentation Precautions:  Precautions Precautions: Fall Restrictions Weight Bearing Restrictions: Yes (L AKA) LLE Weight Bearing: Weight bearing as tolerated General:   Vital Signs: Therapy Vitals Temp: 98 F (36.7 C) Temp Source: Oral Pulse Rate: 78 Resp: 16 BP:  (!) 111/56 mmHg Patient Position (if appropriate): Sitting Oxygen Therapy SpO2: 95 % O2 Device: Not Delivered Pain: Pain Assessment Pain Assessment: 0-10 Pain Score: 5  Pain Type: Phantom pain;Acute pain Pain Location: Leg Pain Orientation: Left Pain Descriptors / Indicators: Aching Pain Frequency: Intermittent Pain Onset: With Activity Pain Intervention(s): Medication (See eMAR)  See Function Navigator for Current Functional Status.   Therapy/Group: Individual Therapy  Lorie Phenix 02/13/2016, 2:54 PM

## 2016-02-13 NOTE — Telephone Encounter (Signed)
-----   Message from Mena Goes, RN sent at 02/10/2016  1:38 PM EDT ----- Regarding: AKA postop in 4 weeks    ----- Message -----    From: Ulyses Amor, PA-C    Sent: 02/10/2016   1:29 PM      To: [facility] Charge Pool  F/U in 4 weeks with Dr. Donnetta Hutching s/p left AKA going to in patient rehab

## 2016-02-13 NOTE — Progress Notes (Signed)
Physical Therapy Session Note  Patient Details  Name: Collin Pearson MRN: [id number] Date of Birth: 24-Aug-1926  Today's Date: 02/13/2016 PT Individual Time: 0900-1003 PT Individual Time Calculation (min): 63 min     Skilled Therapeutic Interventions/Progress Updates:  Patient in room , finishing OT session, assisted in completing teeth brushing and grooming at w/c level.  W/c propulsion to therapy gym with min cues ofr speed and path following, frequent redirection needed as patient gets distracted and is more focused in conversing than activity.  Training in sit to stand in II bars x 4 with standing up to 3 min x4 , cues for posture, L hip extension and alternating grip on bars. Patient needs mod A to maintain standing with single hand hold.   Upper extremity ergometer 1 x 12 min with resistance set at 6.0 in order to increase activity tolerance.  Training in gait/hopping with FWW with max A and w/c follow 2 x 20 feet , multiple LOB with max A to regain stability.  Patient returned to room, left with his wife, all needs within reach.  During session RN notified of phantom pain on L , all medicine has been already provided.    Therapy Documentation Precautions:  Precautions Precautions: Fall Restrictions Weight Bearing Restrictions: Yes (left AKA) LLE Weight Bearing: Weight bearing as tolerated    See Function Navigator for Current Functional Status.   Therapy/Group: Individual Therapy  [name] 02/13/2016, 10:16 AM

## 2016-02-13 NOTE — Progress Notes (Signed)
Occupational Therapy Session Note  Patient Details  Name: Collin Pearson MRN: [id number] Date of Birth: Dec 14, 1926  Today's Date: 02/13/2016 OT Individual Time: [id number] OT Individual Time Calculation (min): 56 min    Short Term Goals: Week 1:  OT Short Term Goal 1 (Week 1): Pt will complete UB dressing with Min A OT Short Term Goal 2 (Week 1): Pt will complete LB dressing with Mod A OT Short Term Goal 3 (Week 1): Pt will complete UB bathing with supervision  OT Short Term Goal 4 (Week 1): Pt will complete LB bathing with Mod A  OT Short Term Goal 5 (Week 1): Pt will identify 1 desentization technique for managing pain in L LE  Skilled Therapeutic Interventions/Progress Updates:    Pt resting in bed eating breakfast upon arrival.  Pt engaged in BADL retraining including bathing and dressing with sit<>stand from w/c at sink.  Focus on functional transfers, sit<>stand, standing balance, and safety awareness to increase independence with BADLs. Pt exhibit significant posterior lean when performing sit<>stand and when standing.  Pt requires max A for sit<>stand with max verbal cues for positioning and sequencing.  Pt requires tot A for bathing buttocks and pulling up pants.  Pt requires BUE support when standing.  Pt requires more than a reasonable amount of time to complete tasks with multiple rest breaks. Pt remained in w/c and handed of to PT.  Therapy Documentation Precautions:  Precautions Precautions: Fall Restrictions Weight Bearing Restrictions: Yes (left AKA) LLE Weight Bearing: Weight bearing as tolerated   Pain: Pt c/o discomfort/increased pain in LLE when pulling up pants; repositioned, emotional support See Function Navigator for Current Functional Status.   Therapy/Group: Individual Therapy  Leroy Libman 02/13/2016, 9:02 AM

## 2016-02-14 ENCOUNTER — Inpatient Hospital Stay (HOSPITAL_COMMUNITY): Payer: BLUE CROSS/BLUE SHIELD | Admitting: Physical Therapy

## 2016-02-14 LAB — GLUCOSE, CAPILLARY
GLUCOSE-CAPILLARY: 162 mg/dL — AB (ref 65–99)
Glucose-Capillary: 205 mg/dL — ABNORMAL HIGH (ref 65–99)

## 2016-02-14 NOTE — Progress Notes (Signed)
[facility] PHYSICAL MEDICINE & REHABILITATION     PROGRESS NOTE  Subjective/Complaints:  Pt sitting up to eat breakfast. Denies pain. Slept well  ROS: +Incisional pain. Denies CP, SOB, N/V/D.  Objective: Vital Signs: Blood pressure 145/54, pulse 78, temperature 97.5 F (36.4 C), temperature source Axillary, resp. rate 16, weight 59.2 kg (130 lb 8.2 oz), SpO2 97 %. No results found. No results for input(s): WBC, HGB, HCT, PLT in the last 72 hours. No results for input(s): NA, K, CL, GLUCOSE, BUN, CREATININE, CALCIUM in the last 72 hours.  Invalid input(s): CO CBG (last 3)   Recent Labs  02/12/16 1145 02/12/16 1635 02/13/16 0644  GLUCAP 180* 217* 116*    Wt Readings from Last 3 Encounters:  02/14/16 59.2 kg (130 lb 8.2 oz)  02/10/16 58.015 kg (127 lb 14.4 oz)  02/05/16 63.05 kg (139 lb)    Physical Exam:  BP 145/54 mmHg  Pulse 78  Temp(Src) 97.5 F (36.4 C) (Axillary)  Resp 16  Wt 59.2 kg (130 lb 8.2 oz)  SpO2 97% Constitutional:  He appears well-developed. Frail appearing male.   HENT: Normocephalic and atraumatic.  Eyes: Conjunctivae and EOM are normal.   Cardiovascular: Normal rate and regular rhythm.Murmur heard. Respiratory: Breath sounds normal. No stridor. He has no wheezes.  GI: Soft. Bowel sounds are normal. He exhibits no distension. There is no tenderness.  Musculoskeletal: He exhibits tenderness (L-AKA). He exhibits no edema.  Right heel red and boggy-- tender to touch.  Neurological: He is alert and oriented.  Motor: B/l UE, RLE 5/5 proximal to distal LLE: Hip flexion 5/5  Skin: Skin is warm and dry.  Surgical incision with sanguinous drainage. A few scabs on incision. Surrounding skin clean without erythema or warmth..  Some ischemic changes to right lower extremity  Sacrum not examined today Psychiatric: He has a normal mood and affect. His behavior is normal.    Assessment/Plan: 1. Functional deficits secondary to left AKA which require  3+ hours per day of interdisciplinary therapy in a comprehensive inpatient rehab setting. Physiatrist is providing close team supervision and 24 hour management of active medical problems listed below. Physiatrist and rehab team continue to assess barriers to discharge/monitor patient progress toward functional and medical goals.  Function:  Bathing Bathing position   Position: Wheelchair/chair at sink  Bathing parts Body parts bathed by patient: Right arm, Left arm, Chest, Abdomen, Front perineal area, Right upper leg, Right lower leg, Left upper leg Body parts bathed by helper: Buttocks  Bathing assist Assist Level: 2 helpers      Upper Body Dressing/Undressing Upper body dressing   What is the patient wearing?: Button up shirt         Button up shirt - Perfomed by patient: Thread/unthread left sleeve, Thread/unthread right sleeve, Button/unbutton shirt Button up shirt - Perfomed by helper: Pull shirt around back    Upper body assist Assist Level: Touching or steadying assistance(Pt > 75%)      Lower Body Dressing/Undressing Lower body dressing Lower body dressing/undressing activity did not occur: N/A What is the patient wearing?: Underwear, Pants, Non-skid slipper socks Underwear - Performed by patient: Thread/unthread right underwear leg, Thread/unthread left underwear leg Underwear - Performed by helper: Thread/unthread left underwear leg, Pull underwear up/down Pants- Performed by patient: Thread/unthread right pants leg Pants- Performed by helper: Thread/unthread left pants leg, Pull pants up/down   Non-skid slipper socks- Performed by helper: Don/doff right sock   Socks - Performed by helper: Don/doff right sock  Lower body assist Assist for lower body dressing: 2 Helpers      Toileting Toileting     Toileting steps completed by helper: Adjust clothing prior to toileting, Performs perineal hygiene Toileting Assistive Devices: Grab bar or rail   Toileting assist Assist level: Touching or steadying assistance (Pt.75%)   Transfers Chair/bed transfer   Chair/bed transfer method: Lateral scoot Chair/bed transfer assist level: Touching or steadying assistance (Pt > 75%) Chair/bed transfer assistive device: Sliding board     Locomotion Ambulation     Max distance: 8 Assist level: Maximal assist (Pt 25 - 49%)   Wheelchair   Type: Manual Max wheelchair distance: 121ft Assist Level: Supervision or verbal cues  Cognition Comprehension Comprehension assist level: Understands basic 90% of the time/cues < 10% of the time  Expression Expression assist level: Expresses complex 90% of the time/cues < 10% of the time  Social Interaction Social Interaction assist level: Interacts appropriately 90% of the time - Needs monitoring or encouragement for participation or interaction.  Problem Solving Problem solving assist level: Solves complex 90% of the time/cues < 10% of the time  Memory Memory assist level: Recognizes or recalls 90% of the time/requires cueing < 10% of the time    Medical Problem List and Plan: 1. Abnormality of gait secondary to left AKA.  Cont CIR therapies 2. DVT Prophylaxis/Anticoagulation: Pharmaceutical: Lovenox 3. Pain Management: Continue oxycodone prn and robaxin tid. Added low dose Neurontin to help with neuropathy.  4. Mood: LCSW to follow for evaluation and support.  5. Neuropsych: This patient is capable of making decisions on his own behalf. 6. Skin/Wound Care:   -continue BID dressing with telfa,4x4,kerlix, ACE to keep wound dry.  Prevalon boot for right heel pain.   Pressure relief to sacrum 7. Fluids/Electrolytes/Nutrition: Monitor I/O. Added supplements as intake has been poor.  8. T2DM poor; controlled: Hgb A1c- 8.0. Monitor BS ac/hs.   Continue glucotrol bid   Levemir decreased to 15U qhs on 6/29  Still somewhat labile. May need to increase again. ?bid dosing.   -NO SUGARS RECORDED  YESTERDAY OTHER THAN AM--have discussed with today's RN 9. CAD: Monitor for symptoms with activity. On Imdur, ASA and Lipitor.  10. Leucocytosis: Monitor for signs of infection. Encourage IS.  11. Acute on chronic renal failure: Has improved with hydration. BUN/Cr- 69/4.06 at admission.  12. Acute on chronic anemia: Continue iron supplement.   Hb 10.0 on 6/29  Labs ordered for Monday 13. GERD: Symptoms controlled on protonix.  14. HTN: Monitor BP.   Continue Norvasc  Cont Furosemide  Due to AM elevations, Hydralazine increased to 75mg  BID on 6/30.  15. Leukocytosis  Afebrile, ?trending down  WBCs 11.7 on 6/29  Labs ordered for Monday  LOS (Days) 4 A FACE TO FACE EVALUATION WAS PERFORMED  SWARTZ,ZACHARY T 02/14/2016 8:29 AM

## 2016-02-14 NOTE — Progress Notes (Signed)
Physical Therapy Session Note  Patient Details  Name: Collin Pearson MRN: [id number] Date of Birth: May 03, 1927  Today's Date: 02/14/2016 PT Individual Time: 1400-1500 PT Individual Time Calculation (min): 60 min   Short Term Goals: Week 1:  PT Short Term Goal 1 (Week 1): Pt will roll R/L with Min A for skin protection in bed PT Short Term Goal 2 (Week 1): Pt will move supine<>sit with Min A PT Short Term Goal 3 (Week 1): Pt will perform basic bed<>transfer with consistent Mod A PT Short Term Goal 4 (Week 1): Pt will ambulate 57' with Min A and RW PT Short Term Goal 5 (Week 1): Pt will initiate stair training  Skilled Therapeutic Interventions/Progress Updates:    Pt received sleeping in bed, easily awakes to voice, c/o some phantom pain but does not rate and agreeable to therapy session.  Session focus on balance, forward weight shift, transfers, endurance, strengthening, and patient/family education.  Pt transitioned supine to sit with supervision using bed rails.  Dynamic sitting balance EOB for LB dressing with min assist to don pants.  Max assist for sit<>stand and pt required mod assist to balance while his daughter fastened his pants.  Pt returned to sitting EOB and able to don his shoe with supervision. PT instructed pt in squat/pivot from EOB>w/c with mod assist for maintaining forward weight shift throughout transfer with verbal cues for head/hips relationship.  W/C propulsion x150' to therapy gym with supervision and verbal cues to avoid veering to the L.  Squat/pivot w/c<>nustep with mod assist and verbal cues.  Nustep x12 minutes at level 6 for RLE strengthening and overall endurance.  Pt required 1 rest break at 8.5 minutes.  Pt returned to room in w/c at end of session.  PT provided education throughout session regarding phantom limb pain and measures to decrease through tapping, rubbing, and light pressure as tolerated.  Pt verbalized understanding.  Pt left upright in w/c at end of  session with call bell in reach and needs met.   Therapy Documentation Precautions:  Precautions Precautions: Fall Restrictions Weight Bearing Restrictions: Yes (L AKA) LLE Weight Bearing: Non weight bearing   See Function Navigator for Current Functional Status.   Therapy/Group: Individual Therapy  Earnest Conroy Penven-Crew 02/14/2016, 3:38 PM

## 2016-02-15 ENCOUNTER — Inpatient Hospital Stay (HOSPITAL_COMMUNITY): Payer: BLUE CROSS/BLUE SHIELD | Admitting: Physical Therapy

## 2016-02-15 ENCOUNTER — Inpatient Hospital Stay (HOSPITAL_COMMUNITY): Payer: BLUE CROSS/BLUE SHIELD | Admitting: Occupational Therapy

## 2016-02-15 LAB — CBC WITH DIFFERENTIAL/PLATELET
Basophils Absolute: 0 10*3/uL (ref 0.0–0.1)
Basophils Relative: 0 %
EOS PCT: 4 %
Eosinophils Absolute: 0.5 10*3/uL (ref 0.0–0.7)
HCT: 33.4 % — ABNORMAL LOW (ref 39.0–52.0)
HEMOGLOBIN: 10.3 g/dL — AB (ref 13.0–17.0)
LYMPHS ABS: 1.5 10*3/uL (ref 0.7–4.0)
LYMPHS PCT: 13 %
MCH: 29.9 pg (ref 26.0–34.0)
MCHC: 30.8 g/dL (ref 30.0–36.0)
MCV: 97.1 fL (ref 78.0–100.0)
MONOS PCT: 9 %
Monocytes Absolute: 1.1 10*3/uL — ABNORMAL HIGH (ref 0.1–1.0)
NEUTROS PCT: 74 %
Neutro Abs: 8.6 10*3/uL — ABNORMAL HIGH (ref 1.7–7.7)
Platelets: 340 10*3/uL (ref 150–400)
RBC: 3.44 MIL/uL — AB (ref 4.22–5.81)
RDW: 15.2 % (ref 11.5–15.5)
WBC: 11.7 10*3/uL — AB (ref 4.0–10.5)

## 2016-02-15 LAB — GLUCOSE, CAPILLARY
GLUCOSE-CAPILLARY: 173 mg/dL — AB (ref 65–99)
GLUCOSE-CAPILLARY: 168 mg/dL — AB (ref 65–99)
GLUCOSE-CAPILLARY: 99 mg/dL (ref 65–99)
GLUCOSE-CAPILLARY: 183 mg/dL — AB (ref 65–99)
Glucose-Capillary: 176 mg/dL — ABNORMAL HIGH (ref 65–99)
Glucose-Capillary: 108 mg/dL — ABNORMAL HIGH (ref 65–99)
Glucose-Capillary: 182 mg/dL — ABNORMAL HIGH (ref 65–99)
Glucose-Capillary: 75 mg/dL (ref 65–99)
Glucose-Capillary: 187 mg/dL — ABNORMAL HIGH (ref 65–99)
Glucose-Capillary: 201 mg/dL — ABNORMAL HIGH (ref 65–99)

## 2016-02-15 LAB — BASIC METABOLIC PANEL
Anion gap: 10 (ref 5–15)
BUN: 90 mg/dL — AB (ref 6–20)
CHLORIDE: 104 mmol/L (ref 101–111)
CO2: 21 mmol/L — ABNORMAL LOW (ref 22–32)
CREATININE: 2.38 mg/dL — AB (ref 0.61–1.24)
Calcium: 9.1 mg/dL (ref 8.9–10.3)
GFR calc Af Amer: 27 mL/min — ABNORMAL LOW (ref >60)
GFR calc non Af Amer: 23 mL/min — ABNORMAL LOW (ref >60)
GLUCOSE: 131 mg/dL — AB (ref 65–99)
POTASSIUM: 4.3 mmol/L (ref 3.5–5.1)
Sodium: 135 mmol/L (ref 135–145)

## 2016-02-15 MED ORDER — HYDRALAZINE HCL 50 MG PO TABS
75.0000 mg | ORAL_TABLET | Freq: Three times a day (TID) | ORAL | Status: DC
  Administered 2016-02-15 – 2016-02-19 (×12): 75 mg via ORAL
  Filled 2016-02-15 (×12): qty 1

## 2016-02-15 MED ORDER — INSULIN DETEMIR 100 UNIT/ML ~~LOC~~ SOLN
10.0000 [IU] | Freq: Every day | SUBCUTANEOUS | Status: DC
  Administered 2016-02-15 – 2016-02-17 (×3): 10 [IU] via SUBCUTANEOUS
  Filled 2016-02-15 (×3): qty 0.1

## 2016-02-15 MED ORDER — INSULIN DETEMIR 100 UNIT/ML ~~LOC~~ SOLN
5.0000 [IU] | Freq: Every day | SUBCUTANEOUS | Status: DC
  Administered 2016-02-15 – 2016-02-17 (×3): 5 [IU] via SUBCUTANEOUS
  Filled 2016-02-15 (×3): qty 0.05

## 2016-02-15 NOTE — Progress Notes (Signed)
Social Work Patient ID: Collin Pearson, male   DOB: February 13, 1927, 82 y.o.   MRN: [id number] Met with pt and daughter to discuss how therapies are going. Pt feels they are going slow and he is making slow improvements. Daughter reports she can see some progress, but is worried about His care needs at home. His wife does work full time, when asked who would be with him, daughter is unsure. Daughter had told worker she would be assisting on day of assessment. Discussed [employer] of [place] may not cover a  NH due to pt coming to Rehab. Will await team conference and see what the pt and family want to pursue. Made aware of coverage issue.

## 2016-02-15 NOTE — Progress Notes (Signed)
Occupational Therapy Session Note  Patient Details  Name: Collin Pearson MRN: [id number] Date of Birth: 04/13/1927  Today's Date: 02/15/2016 OT Individual Time: 1103-1203 OT Individual Time Calculation (min): 60 min    Short Term Goals: Week 1:  OT Short Term Goal 1 (Week 1): Pt will complete UB dressing with Min A OT Short Term Goal 2 (Week 1): Pt will complete LB dressing with Mod A OT Short Term Goal 3 (Week 1): Pt will complete UB bathing with supervision  OT Short Term Goal 4 (Week 1): Pt will complete LB bathing with Mod A  OT Short Term Goal 5 (Week 1): Pt will identify 1 desentization technique for managing pain in L LE  Skilled Therapeutic Interventions/Progress Updates:    Pt completed bathing and dressing sit to stand at the sink this session.  Max assist for stand pivot from the bedside chair to the wheelchair in order to progress to the sink for bathing and dressing.  Decreased efficiency with reaching the RLE for doffing and donning all clothing.  Therapist assisted with starting gripper sock, brief, over the RLE.  Max assist for sit to stand and standing during peri washing as well as pulling brief and pants over hips.  Pt with increased posterior lean and increased rightma knee flexion in standing, requiring mod instructional cueing along with max assist to maintain standing.  He was able to fasten his pants and belt in sitting after therapist assisted with pulling them up over the hips.  He was left in his wheelchair with NT to finish grooming task of brushing his teeth and for her to check his CBG.  Nursing tech aware that pt did not have a safety belt on in place and she reports that she will put it on if pt's daughter is not back when he finishes.    Therapy Documentation Precautions:  Precautions Precautions: Fall Restrictions Weight Bearing Restrictions: No LLE Weight Bearing: Non weight bearing (AKA)  Pain: Pain Assessment Pain Assessment: 0-10 Pain Score: 6   Pain Type: Acute pain Pain Location: Leg Pain Orientation: Left Pain Intervention(s): Repositioned Multiple Pain Sites: No PAINAD (Pain Assessment in Advanced Dementia) Breathing: normal Body Language: relaxed Consolability: no need to console ADL: See Function Navigator for Current Functional Status.   Therapy/Group: Individual Therapy  Anntonette Madewell OTR/L 02/15/2016, 12:18 PM

## 2016-02-15 NOTE — Progress Notes (Addendum)
Physical Therapy Session Note  Patient Details  Name: Collin Pearson MRN: [id number] Date of Birth: 12-Apr-1927  Today's Date: 02/15/2016 PT Individual Time: [phone] and 1401-1501 PT Individual Time Calculation (min): 75 min and 60 min    Short Term Goals: Week 1:  PT Short Term Goal 1 (Week 1): Pt will roll R/L with Min A for skin protection in bed PT Short Term Goal 2 (Week 1): Pt will move supine<>sit with Min A PT Short Term Goal 3 (Week 1): Pt will perform basic bed<>transfer with consistent Mod A PT Short Term Goal 4 (Week 1): Pt will ambulate 64' with Min A and RW PT Short Term Goal 5 (Week 1): Pt will initiate stair training  Skilled Therapeutic Interventions/Progress Updates:    Treatment 1: Pt received in bed & agreeable to PT, noting 3-4/10 L residual limb pain & RN notified. [name] residual limb unwrapped & PT provided total A with rewrapping limb. Pt able to transfer supine>sit with supervision, bed rails & HOB elevated with significantly extra time. Pt required multiple cues to scoot forward to place R foot on floor, as pt requested to have trash can put under foot for extra support & PT educated pt on decreased safety with this. Pt very fearful of falling (reports 1 fall at home prior to admission). Pt reported need to use restroom & completed squat pivot EOB>w/c with mod A. Pt able to complete transfer with light mod A but moves extremely slow during task. Transported pt into bathroom & completed squat pivot w/c<>toilet with mod A & use of grab bars. Pt able to push up to squat position to allow therapist to doff/don underwear; (+) void & BM. PT provided assistance for peri hygiene. Therapist provided maximum multimodal cuing/education for hand placement during transfers & cuing for pt to turn more towards seat, as pt sat on edge of w/c on one occasion. Pt performed hand hygiene at sink from w/c level. Pt, therapist & daughter Veterinary surgeon) discussed pt's d/c plans. Per daughter, pt's  wife works during the day and family/friends are unable to provide 24 hr care family requesting pt d/c to SNF following CIR. Pt prefers for church members to stay with him during the day but daughter is not sure this is a possibility. Therapist informed LCSW of pt & family d/c preferences. Educated pt not to attempt to get up by himself & to call for nursing assistance; notified nursing of updated safety plan as Mod A for squat pivot transfers. At end of session pt left in w/c with QRB in place, all needs within reach & daughter present.  Treatment 2: Pt received asleep in bed with daughter present. Pt required maximum multimodal cuing to awaken with daughter reporting pt does sleep heavily throughout the day and then does not rest well at night. Pt with cushion under LLE & foot of bed bent. When pt awoke, educated pt not to put pillow under LLE & need to promote extension. Educated pt on contracture prevention & affects on prosthesis. Pt able to roll supine>R sidelying with supervision & use of bed rails to perform LLE hip extension x 10 reps. Pt then rolled R sidelying>prone with min A for positioning & verbal cues for sequencing. Pt able to lay on stomach x 5 minutes with reports of tightness in distal L residual limb that decreased by end of activity. Educated pt to transfer to prone at least 10 minutes each day with nursing assistance to achieve position. Pt then transferred  to sitting EOB with supervision & bed rails, and squat pivot bed>w/c with mod A with significant time to do so. Pt propelled w/c in controlled environment x 150 ft with supervision & BUE for cardiovascular endurance training. At end of session pt returned to room & left sitting in w/c with QRB in place, all needs within reach, & daughter present. Encouraged pt to sit up in w/c through dinner.  Therapy Documentation Precautions:  Precautions Precautions: Fall Restrictions Weight Bearing Restrictions: Yes (L AKA) LLE Weight Bearing:  Non weight bearing  Pain: Pain Assessment Pain Assessment: 0-10 Pain Score: 4  Pain Location:  (residual limb) Pain Orientation: Left Pain Intervention(s): RN made aware;Repositioned   See Function Navigator for Current Functional Status.   Therapy/Group: Individual Therapy  [name] 02/15/2016, 7:45 AM

## 2016-02-15 NOTE — Progress Notes (Signed)
Alert and oriented. Reported pain twice today and medicated both times with relief.  Up in wheelchair or chair beside bed most of day.  Reddened area noted on "bunion" on R foot.  Allevyn applied for protection.

## 2016-02-15 NOTE — Progress Notes (Signed)
Recreational Therapy Assessment and Plan  Patient Details  Name: Collin Pearson MRN: [id number] Date of Birth: 28-Jun-1927 Today's Date: 02/15/2016  Rehab Potential: Good ELOS:     Assessment Clinical Impression:  Problem List:  Patient Active Problem List   Diagnosis Date Noted  . Unilateral AKA ([place]) 02/10/2016  . Gastroesophageal reflux disease with esophagitis   . Anemia of chronic disease   . AKI (acute kidney injury) ([place])   . Status post below knee amputation of left lower extremity ([place])   . Coronary artery disease involving coronary bypass graft of native heart without angina pectoris   . S/P AVR   . Benign essential HTN   . Diabetes mellitus type 2 in nonobese (HCC)   . S/p bilateral carotid endarterectomy   . Chronic kidney disease (CKD), stage IV (severe) ([place])   . Acute blood loss anemia   . Post-operative pain   . Leukocytosis   . PAD (peripheral artery disease) ([place]) 02/08/2016  . Acute on chronic diastolic CHF (congestive heart failure), NYHA class 1 ([place]) 02/09/2015  . Physical deconditioning   . Acute on chronic renal failure ([place]) 02/07/2015  . Atrial fibrillation ([place]) 02/07/2015  . Bradycardia 02/04/2015  . Encounter for nasogastric tube placement   . Endotracheally intubated   . Hypoxia   . Pulmonary edema   . Peripheral arterial disease ([place]) 08/22/2014  . Carotid artery stenosis,Last dopplers 09/20/12 05/29/2013  . Ventral hernia 10/04/2012  . PAF (paroxysmal atrial fibrillation), s/p cabg 06/18/2012  . S/P CABG x 1 with tissue AVR 06/13/12 06/14/2012  . Rib fractures, several, left side  06/11/2012  . CKD (chronic kidney disease) stage 4, GFR 15-29 ml/min (HCC) 06/11/2012  . HTN (hypertension) 06/06/2012  . Dyslipidemia 06/06/2012  . Diabetes mellitus ([place]) 06/06/2012  . NSTEMI (non-ST elevated myocardial infarction) ([place]) 06/06/2012  .  Chronic anemia 06/06/2012  . Acute pulmonary edema ([place]) 06/06/2012  . PVD, with hx of bilateral CEA at [facility], and previous bilateral carotid stenting, with high grade ISR of Rt. carotid 05/18/12 05/18/2012  . Angina effort ([place]) 05/18/2012  . Clotting disorder ([place])   . CAD, patent stent to LAD and mid RCA with occluded OM2 branch    . AS (aortic stenosis), critical stenosis     Past Medical History:  Past Medical History  Diagnosis Date  . Clotting disorder ([place])   . Coronary artery disease   . AS (aortic stenosis)     status post aortic valve replacement with an [employer] model 12 T. fracture bioprosthesis  . Anginal pain ([place])   . Hypertension   . Diabetes mellitus   . Peripheral vascular disease ([place])   . GERD (gastroesophageal reflux disease)   . CKD (chronic kidney disease) stage 4, GFR 15-29 ml/min (HCC)   . PVD (peripheral vascular disease), with hsx of bilateral carotid endarterectomies at [facility], and bilateral carotid stenting 05/18/2012  . Heart murmur   . PAF (paroxysmal atrial fibrillation), s/p cabg 06/18/2012  . Critical lower limb ischemia   . Arthritis   . Anxiety    Past Surgical History:  Past Surgical History  Procedure Laterality Date  . Endarterectomy    . Prostate surgery    . Cardiac stents    . Cardiac catheterization    . Coronary artery bypass graft  06/13/2012    Procedure: CORONARY ARTERY BYPASS GRAFTING (CABG); Surgeon: Grace Isaac, MD; Location: [facility]; Service: Open Heart Surgery; Laterality: N/A; must start at 69 - appointment  at 0800  . Aortic valve replacement  06/13/2012    Procedure: AORTIC VALVE REPLACEMENT (AVR); Surgeon: Grace Isaac, MD; Location: [facility]; Service: Open Heart Surgery; Laterality: N/A;  . Cardiac stress test  06/03/2010    Mild anterolateral ischemis  . Left and right heart  catheterization with coronary angiogram N/A 05/18/2012    Procedure: LEFT AND RIGHT HEART CATHETERIZATION WITH CORONARY ANGIOGRAM; Surgeon: Lorretta Harp, MD; Location: [facility] CATH LAB; Service: Cardiovascular; Laterality: N/A;  . Carotid angiogram N/A 05/18/2012    Procedure: CAROTID ANGIOGRAM; Surgeon: Lorretta Harp, MD; Location: [facility] CATH LAB; Service: Cardiovascular; Laterality: N/A;  . Cardiac valve replacement    . Above knee leg amputation Left 02/08/2016  . Amputation Left 02/08/2016    Procedure: AMPUTATION ABOVE KNEE; Surgeon: Rosetta Posner, MD; Location: [facility] OR; Service: Vascular; Laterality: Left;    Assessment & Plan Clinical Impression: Collin Pearson is a 82 y.o. Male with history of HTN, DM, PAF, CAD with AS s/p CABG with AVR, CKD stage IV, PAD s/p B-carotid stents and history of calcified vessels BLE. He developed gangrenous changes left foot with severe erythema and skin breakdown of pretibial tissue with severe pain X 1 month and AKA recommended due to inability to salvage limb. He was admitted on 06/26 for L-AKA by Dr. Donnetta Hutching. Post op with phantom pain as well as muscle spasms. Therapy ongoing and CIR recommended for follow up therapy.  Patient transferred to CIR on 02/10/2016.      Pt presents with decreased activity tolerance, decreased functional mobility, decreased balance limiting pt's independence with leisure/community pursuits.   Leisure History/Participation Premorbid leisure interest/current participation: Consulting civil engineer (Comment);Nature - Fishing;Nature - Hunting;Community - Grocery store Premorbid leisure interest/no interest in trying: Consulting civil engineer (Comment) Other Leisure Interests: Television;Reading Leisure Participation Style: With Family/Friends Awareness of Community Resources: Good-identify 3 post discharge leisure resources Psychosocial / Spiritual Spiritual Interests: Church;Womens'Men's  Groups Patient agreeable to Pet Therapy: Yes Social interaction - Mood/Behavior: Cooperative Engineer, drilling for Education?: Yes Recreational Therapy Orientation Orientation -Reviewed with patient: Available activity resources Strengths/Weaknesses Patient Strengths/Abilities: Willingness to participate;Active premorbidly Patient weaknesses: Physical limitations TR Patient demonstrates impairments in the following area(s): Edema;Endurance;Motor;Pain;Safety;Sensory;Skin Integrity  Plan Rec Therapy Plan Is patient appropriate for Therapeutic Recreation?: Yes Rehab Potential: Good Treatment times per week: Min 1 time per week >20 minutes TR Treatment/Interventions: Adaptive equipment instruction;1:1 session;Balance/vestibular training;Functional mobility training;Community reintegration;Patient/family education;Therapeutic activities;Recreation/leisure participation;Therapeutic exercise;UE/LE Coordination activities  Recommendations for other services: None  Discharge Criteria: Patient will be discharged from TR if patient refuses treatment 3 consecutive times without medical reason.  If treatment goals not met, if there is a change in medical status, if patient makes no progress towards goals or if patient is discharged from hospital.  The above assessment, treatment plan, treatment alternatives and goals were discussed and mutually agreed upon: by patient  [place] 02/15/2016, 12:15 PM

## 2016-02-15 NOTE — Progress Notes (Signed)
[place] PHYSICAL MEDICINE & REHABILITATION     PROGRESS NOTE  Subjective/Complaints:  Pt sitting up at the edge of his bed.  He states he had a really good weekend and a really good breakfast this AM.    ROS:  Denies CP, SOB, N/V/D.  Objective: Vital Signs: Blood pressure 154/60, pulse 78, temperature 97.3 F (36.3 C), temperature source Oral, resp. rate 1, weight 58.9 kg (129 lb 13.6 oz), SpO2 95 %. No results found. No results for input(s): WBC, HGB, HCT, PLT in the last 72 hours. No results for input(s): NA, K, CL, GLUCOSE, BUN, CREATININE, CALCIUM in the last 72 hours.  Invalid input(s): CO CBG (last 3)   Recent Labs  02/14/16 1131 02/14/16 1631 02/15/16 0647  GLUCAP 162* 205* 75    Wt Readings from Last 3 Encounters:  02/15/16 58.9 kg (129 lb 13.6 oz)  02/10/16 58.015 kg (127 lb 14.4 oz)  02/05/16 63.05 kg (139 lb)    Physical Exam:  BP 154/60 mmHg  Pulse 78  Temp(Src) 97.3 F (36.3 C) (Oral)  Resp 1  Wt 58.9 kg (129 lb 13.6 oz)  SpO2 95% Constitutional:  He appears well-developed. Frail appearing male.   HENT: Normocephalic and atraumatic.  Eyes: Conjunctivae and EOM are normal.   Cardiovascular: Normal rate and regular rhythm.Murmur heard. Respiratory: Breath sounds normal. No stridor. He has no wheezes.  GI: Soft. Bowel sounds are normal. He exhibits no distension. There is no tenderness.  Musculoskeletal: He exhibits mild tenderness (L-AKA). He exhibits no edema.  Neurological: He is alert and oriented.  Motor: B/l UE, RLE 5/5 proximal to distal LLE: Hip flexion 5/5  Skin: Skin is warm and dry.  Surgical incision c/d/i.   Some ischemic changes to right lower extremity  Sacrum not examined today Psychiatric: He has a normal mood and affect. His behavior is normal.    Assessment/Plan: 1. Functional deficits secondary to left AKA which require 3+ hours per day of interdisciplinary therapy in a comprehensive inpatient rehab  setting. Physiatrist is providing close team supervision and 24 hour management of active medical problems listed below. Physiatrist and rehab team continue to assess barriers to discharge/monitor patient progress toward functional and medical goals.  Function:  Bathing Bathing position   Position: Wheelchair/chair at sink  Bathing parts Body parts bathed by patient: Right arm, Left arm, Chest, Abdomen, Front perineal area, Right upper leg, Right lower leg, Left upper leg Body parts bathed by helper: Buttocks  Bathing assist Assist Level: 2 helpers      Upper Body Dressing/Undressing Upper body dressing   What is the patient wearing?: Button up shirt         Button up shirt - Perfomed by patient: Thread/unthread left sleeve, Thread/unthread right sleeve, Button/unbutton shirt Button up shirt - Perfomed by helper: Pull shirt around back    Upper body assist Assist Level: Touching or steadying assistance(Pt > 75%)      Lower Body Dressing/Undressing Lower body dressing Lower body dressing/undressing activity did not occur: N/A What is the patient wearing?: Underwear, Pants, Non-skid slipper socks Underwear - Performed by patient: Thread/unthread right underwear leg, Thread/unthread left underwear leg Underwear - Performed by helper: Thread/unthread left underwear leg, Pull underwear up/down Pants- Performed by patient: Thread/unthread right pants leg Pants- Performed by helper: Thread/unthread left pants leg, Pull pants up/down   Non-skid slipper socks- Performed by helper: Don/doff right sock   Socks - Performed by helper: Don/doff right sock  Lower body assist Assist for lower body dressing: 2 Helpers      Toileting Toileting     Toileting steps completed by helper: Adjust clothing prior to toileting, Performs perineal hygiene Toileting Assistive Devices: Grab bar or rail  Toileting assist Assist level: Touching or steadying assistance (Pt.75%)    Transfers Chair/bed transfer   Chair/bed transfer method: Squat pivot Chair/bed transfer assist level: Moderate assist (Pt 50 - 74%/lift or lower) Chair/bed transfer assistive device: Armrests     Locomotion Ambulation     Max distance: 8 Assist level: Maximal assist (Pt 25 - 49%)   Wheelchair   Type: Manual Max wheelchair distance: 125ft Assist Level: Supervision or verbal cues  Cognition Comprehension Comprehension assist level: Understands basic 90% of the time/cues < 10% of the time  Expression Expression assist level: Expresses basic 90% of the time/requires cueing < 10% of the time.  Social Interaction Social Interaction assist level: Interacts appropriately 75 - 89% of the time - Needs redirection for appropriate language or to initiate interaction.  Problem Solving Problem solving assist level: Solves basic 90% of the time/requires cueing < 10% of the time  Memory Memory assist level: Recognizes or recalls 90% of the time/requires cueing < 10% of the time    Medical Problem List and Plan: 1. Abnormality of gait secondary to left AKA.  Cont CIR therapies 2. DVT Prophylaxis/Anticoagulation: Pharmaceutical: Lovenox 3. Pain Management: Continue oxycodone prn and robaxin tid. Added low dose Neurontin to help with neuropathy.  4. Mood: LCSW to follow for evaluation and support.  5. Neuropsych: This patient is capable of making decisions on his own behalf. 6. Skin/Wound Care:   -continue BID dressing with telfa,4x4,kerlix, ACE to keep wound dry.  Prevalon boot for right heel pain.   Pressure relief to sacrum 7. Fluids/Electrolytes/Nutrition: Monitor I/O. Added supplements as intake has been poor.  8. T2DM poor; controlled: Hgb A1c- 8.0. Monitor BS ac/hs.   Continue glucotrol bid   Levemir decreased to 15U qhs on 6/29, decreased at to 10mg  on qhs on 7/3.  Will start Levamir 5U daily on 7/3  Still somewhat labile.  9. CAD: Monitor for symptoms with activity. On  Imdur, ASA and Lipitor.  10. Leucocytosis: Monitor for signs of infection. Encourage IS.  11. Acute on chronic renal failure: Has improved with hydration. BUN/Cr- 69/4.06 at admission.  12. Acute on chronic anemia: Continue iron supplement.   Hb 10.0 on 6/29  Labs pending for Monday 13. GERD: Symptoms controlled on protonix.  14. HTN: Monitor BP.   Continue Norvasc  Cont Furosemide  Due to AM elevations, Hydralazine increased to 75mg  BID on 6/30 increased to 75mg  TID on 7/3.  15. Leukocytosis  Afebrile, ?trending down  WBCs 11.7 on 6/29  Labs pending for Monday  LOS (Days) 5 A FACE TO FACE EVALUATION WAS PERFORMED  Almeta Geisel Lorie Phenix 02/15/2016 8:38 AM

## 2016-02-16 ENCOUNTER — Inpatient Hospital Stay (HOSPITAL_COMMUNITY): Payer: BLUE CROSS/BLUE SHIELD | Admitting: Occupational Therapy

## 2016-02-16 ENCOUNTER — Inpatient Hospital Stay (HOSPITAL_COMMUNITY): Payer: BLUE CROSS/BLUE SHIELD | Admitting: Physical Therapy

## 2016-02-16 DIAGNOSIS — N183 Chronic kidney disease, stage 3 (moderate): Secondary | ICD-10-CM

## 2016-02-16 DIAGNOSIS — N179 Acute kidney failure, unspecified: Secondary | ICD-10-CM

## 2016-02-16 DIAGNOSIS — N189 Chronic kidney disease, unspecified: Secondary | ICD-10-CM | POA: Insufficient documentation

## 2016-02-16 DIAGNOSIS — D638 Anemia in other chronic diseases classified elsewhere: Secondary | ICD-10-CM

## 2016-02-16 LAB — GLUCOSE, CAPILLARY
GLUCOSE-CAPILLARY: 227 mg/dL — AB (ref 65–99)
GLUCOSE-CAPILLARY: 191 mg/dL — AB (ref 65–99)
Glucose-Capillary: 192 mg/dL — ABNORMAL HIGH (ref 65–99)

## 2016-02-16 NOTE — Progress Notes (Signed)
Occupational Therapy Session Note  Patient Details  Name: Collin Pearson MRN: [id number] Date of Birth: 1927/02/10  Today's Date: 02/16/2016 OT Individual Time: [id number] OT Individual Time Calculation (min): 64 min    Short Term Goals: Week 1:  OT Short Term Goal 1 (Week 1): Pt will complete UB dressing with Min Pearson OT Short Term Goal 2 (Week 1): Pt will complete LB dressing with Mod Pearson OT Short Term Goal 3 (Week 1): Pt will complete UB bathing with supervision  OT Short Term Goal 4 (Week 1): Pt will complete LB bathing with Mod Pearson  OT Short Term Goal 5 (Week 1): Pt will identify 1 desentization technique for managing pain in L LE  Skilled Therapeutic Interventions/Progress Updates:    Pt tx today focused on increasing pts awareness to standing posture during ADL completion w/c level at sink. Pt still requires mod tactile and verbal cuing to extend right knee during standing portions of ADLs and requires bilateral support on sink when standing. Pt was instructed on using visual biofeedback to correct posture and was able to identify postural leaning and self correct with cuing. No pain was reported today, though pt did report itching in scrotum with observed redness. Nursing notified and reported that she would further assess after ADLs. Pt completed UB dressing with Min Pearson for threading L UE into shirt and Mod Pearson for LB dressing for donning sock and lifting pants over hips. Pt doffs hearing aides during ADLs and benefits from having them donned during standing tasks to improve comprehension with instruction on posture and extending right knee. Pt completed sit to stand transfers with Mod Pearson and manual cuing for weight shift as needed. At end of session pt left in w/c with QRB and all needs within reach.   Therapy Documentation Precautions:  Precautions Precautions: Fall Restrictions Weight Bearing Restrictions: Yes (L AKA) LLE Weight Bearing: Non weight bearing General:   Vital Signs:    Pain: Pain Assessment Pain Assessment: 0-10 Pain Score: 3  Pain Location:  (residual limb) Pain Orientation: Left Pain Intervention(s):  (premedicated) Multiple Pain Sites: Yes 2nd Pain Site Pain Location:  (ankle/foot) Pain Orientation: Right Pain Descriptors / Indicators: Stabbing Pain Intervention(s):  (premedicated) ADL: ADL Eating: Modified independent Where Assessed-Eating: Wheelchair Grooming: Setup Where Assessed-Grooming: Wheelchair Upper Body Bathing: Minimal assistance Where Assessed-Upper Body Bathing: Wheelchair Lower Body Bathing: Moderate assistance Where Assessed-Lower Body Bathing: Wheelchair Upper Body Dressing: Moderate assistance Where Assessed-Upper Body Dressing: Wheelchair Lower Body Dressing: Other (Comment) (2 helpers) Where Assessed-Lower Body Dressing: Wheelchair Toileting: Not assessed Toilet Transfer: Not assessed Toilet Transfer Method: Unable to assess Tub/Shower Transfer: Not assessed    See Function Navigator for Current Functional Status.   Therapy/Group: Individual Therapy  Collin Pearson Collin Pearson Collin Pearson 02/16/2016, 12:57 PM

## 2016-02-16 NOTE — Progress Notes (Signed)
Physical Therapy Session Note  Patient Details  Name: Collin Pearson MRN: [id number] Date of Birth: 02-25-1927  Today's Date: 02/16/2016 PT Individual Time: [id number] and 1303-1401 PT Individual Time Calculation (min): 73 min and 58 min  Short Term Goals: Week 1:  PT Short Term Goal 1 (Week 1): Pt will roll R/L with Min A for skin protection in bed PT Short Term Goal 2 (Week 1): Pt will move supine<>sit with Min A PT Short Term Goal 3 (Week 1): Pt will perform basic bed<>transfer with consistent Mod A PT Short Term Goal 4 (Week 1): Pt will ambulate 54' with Min A and RW PT Short Term Goal 5 (Week 1): Pt will initiate stair training  Skilled Therapeutic Interventions/Progress Updates:    Treatment 1: Pt received in bed with RN present & pt waking up for the day. Pt required extra time & wiping face with cold, wet cloth to wake up. Pt reported need to use urinal & transferred supine>sitting EOB with significantly extra time, supervision & hospital bed features. Pt continues to require maximum cuing for safety & sequencing to sit on EOB with foot on floor as he wants to support it with trash can; reinforced that this was unsafe as trash can does not provide a stable surface & could slip. While sitting at EOB pt unable to use urinal but reported need to have BM. Pt with supervision for sitting balance with BUE support. Instructed pt on hand placement, sequencing & technique for Mod A squat pivot bed>w/c. Pt attempted to sit before fully turning to w/c seat & PT provided max A to return to bed for safety & to attempt transfer again with success.  Transported pt in bathroom where he was able to lock w/c brakes with cuing & squat pivot w/c<>toilet with grab bars, armrests, cuing & mod A. Pt (+) void & BM with total A for peri-hygiene. [name] L residual limb unwrapped therefore PT provided total A for re-wrapping limb. Pt requested to eat breakfast & while pt did so, discussed d/c plans with him. Pt reports  he talked to his wife & he believes he can have friend Audry Pili) from church stay with him 24 hrs after d/c. Pt reports this friend has a "bad back"; pt also stated "if I fall I will just call 9-1-1". Dicussed need for family/friend hands on training prior to d/c & implications of falling at home (injuring residual limb) & pt voiced understanding. Pt voiced financial concerns regarding hiring someone to assist 24/7. Educated pt on therapist's concerns of pt ambulating around home as it is not w/c accessible. Encouraged pt to wake up earlier & eat breakfast prior to scheduled therapy treatments in order to maximize participation and activity during sessions & pt voiced understanding. At end of session pt left sitting in w/c set up with breakfast tray & call bell within reach.  Treatment 2: Pt received in bed with wife, [name], present during session. Pt agreeable to PT & noting 8/10 LLE pain & RN notified. Discussed d/c plans with pt & wife; wife reports pt's daughter can only stay with him one week in July but they are looking for 24 hour care but wish for LCSW to continue to pursue SNF placement. Pt transferred supine>sitting with HOB elevated & bed rails with supervision and significantly extra time; pt's wife reports he moves slowly PTA. Pt reported need to use restroom & completed squat pivot bed>w/c with Mod A. Pt required maximum cuing to scoot out to  EOB, hand placement and for sequencing. Encouraged pt to perform transfer slightly quicker as pt will squat mid transfer for extended period of time. Pt transferred on toilet with use of grab bars & mod A. Pt required total A for doffing/donning underwear & pants, (+) void. Transported pt to hallway for gait training x 10 ft with RW, w/c follow provided by wife, and Mod A. Pt with minimal foot clearance during ambulation & limited by fatigue. Pt returned to room & completed squat pivot transfer w/c>recliner with min/mod A. At end of session pt left in recliner  with BLE elevated, QRB in place, all needs within reach & wife present to supervise. During session encouraged pt to sit up as much as possible during the day, either in the recliner or the w/c, & pt voiced understanding.  Pt requires significantly extra time to perform all simple functional tasks.  Therapy Documentation Precautions:  Precautions Precautions: Fall Restrictions Weight Bearing Restrictions: Yes (L AKA) LLE Weight Bearing: Non weight bearing  Pain: Pain Assessment Pain Assessment: 0-10 Pain Score: 3  Pain Location:  (residual limb) Pain Orientation: Left Pain Intervention(s):  (premedicated) Multiple Pain Sites: Yes 2nd Pain Site Pain Location:  (ankle/foot) Pain Orientation: Right Pain Descriptors / Indicators: Stabbing Pain Intervention(s):  (premedicated)    See Function Navigator for Current Functional Status.   Therapy/Group: Individual Therapy  [name] 02/16/2016, 7:47 AM

## 2016-02-16 NOTE — Progress Notes (Signed)
Hypoglycemic Event  CBG: 61  Treatment: 15 GM carbohydrate snack  Symptoms: None  Follow-up CBG: Time:0701 CBG Result:106  Possible Reasons for Event: Medication regimen: increased levemir 7/3  Comments/MD notified:Dan [place], PA    Brita Romp

## 2016-02-16 NOTE — Progress Notes (Signed)
[place] PHYSICAL MEDICINE & REHABILITATION     PROGRESS NOTE  Subjective/Complaints:  Pt laying in bed.  He is very sleepy this AM.  When awoken, he states he is doing well and quotes Bible verses to indicate how well.    ROS:  Denies CP, SOB, N/V/D.  Objective: Vital Signs: Blood pressure 134/62, pulse 77, temperature 97.7 F (36.5 C), temperature source Oral, resp. rate 16, weight 58.3 kg (128 lb 8.5 oz), SpO2 97 %. No results found.  Recent Labs  02/15/16 0842  WBC 11.7*  HGB 10.3*  HCT 33.4*  PLT 340    Recent Labs  02/15/16 0842  NA 135  K 4.3  CL 104  GLUCOSE 131*  BUN 90*  CREATININE 2.38*  CALCIUM 9.1   CBG (last 3)   Recent Labs  02/15/16 0647 02/15/16 1207 02/15/16 1656  GLUCAP 75 187* 201*    Wt Readings from Last 3 Encounters:  02/16/16 58.3 kg (128 lb 8.5 oz)  02/10/16 58.015 kg (127 lb 14.4 oz)  02/05/16 63.05 kg (139 lb)    Physical Exam:  BP 134/62 mmHg  Pulse 77  Temp(Src) 97.7 F (36.5 C) (Oral)  Resp 16  Wt 58.3 kg (128 lb 8.5 oz)  SpO2 97% Constitutional:  He appears well-developed. Frail appearing male.   HENT: Normocephalic and atraumatic.  Eyes: Conjunctivae and EOM are normal.   Cardiovascular: Normal rate and regular rhythm.Murmur heard. Respiratory: Breath sounds normal. No stridor. He has no wheezes.  GI: Soft. Bowel sounds are normal. He exhibits no distension. There is no tenderness.  Musculoskeletal: He exhibits mild tenderness (L-AKA). He exhibits no edema.  Neurological: He is alert and oriented.  Motor: B/l UE, RLE 5/5 proximal to distal LLE: Hip flexion 5/5  Skin: Skin is warm and dry.  Surgical incision c/d/i.   Some ischemic changes to right lower extremity  Sacrum not examined today Psychiatric: He has a normal mood and affect. His behavior is normal.    Assessment/Plan: 1. Functional deficits secondary to left AKA which require 3+ hours per day of interdisciplinary therapy in a comprehensive  inpatient rehab setting. Physiatrist is providing close team supervision and 24 hour management of active medical problems listed below. Physiatrist and rehab team continue to assess barriers to discharge/monitor patient progress toward functional and medical goals.  Function:  Bathing Bathing position   Position: Wheelchair/chair at sink  Bathing parts Body parts bathed by patient: Right arm, Left arm, Chest, Abdomen, Right upper leg, Right lower leg, Left upper leg Body parts bathed by helper: Front perineal area, Buttocks, Back  Bathing assist Assist Level: 2 helpers      Upper Body Dressing/Undressing Upper body dressing   What is the patient wearing?: Button up shirt         Button up shirt - Perfomed by patient: Thread/unthread right sleeve, Thread/unthread left sleeve, Button/unbutton shirt Button up shirt - Perfomed by helper: Pull shirt around back    Upper body assist Assist Level: Touching or steadying assistance(Pt > 75%)      Lower Body Dressing/Undressing Lower body dressing Lower body dressing/undressing activity did not occur: N/A What is the patient wearing?: Underwear, Pants, Non-skid slipper socks Underwear - Performed by patient: Thread/unthread left underwear leg Underwear - Performed by helper: Pull underwear up/down, Thread/unthread right underwear leg Pants- Performed by patient: Fasten/unfasten pants Pants- Performed by helper: Thread/unthread right pants leg, Thread/unthread left pants leg, Pull pants up/down   Non-skid slipper socks- Performed by helper:  Don/doff right sock   Socks - Performed by helper: Don/doff right sock              Lower body assist Assist for lower body dressing: 2 Helpers      Toileting Toileting     Toileting steps completed by helper: Adjust clothing prior to toileting, Performs perineal hygiene, Adjust clothing after toileting Toileting Assistive Devices: Grab bar or rail  Toileting assist Assist level: Set  up/obtain supplies, Supervision or verbal cues, Touching or steadying assistance (Pt.75%)   Transfers Chair/bed transfer   Chair/bed transfer method: Squat pivot Chair/bed transfer assist level: Moderate assist (Pt 50 - 74%/lift or lower) Chair/bed transfer assistive device: Armrests     Locomotion Ambulation     Max distance: 8 Assist level: Maximal assist (Pt 25 - 49%)   Wheelchair   Type: Manual Max wheelchair distance: 150 ft Assist Level: Supervision or verbal cues  Cognition Comprehension Comprehension assist level: Understands basic 90% of the time/cues < 10% of the time  Expression Expression assist level: Expresses basic 90% of the time/requires cueing < 10% of the time.  Social Interaction Social Interaction assist level: Interacts appropriately 90% of the time - Needs monitoring or encouragement for participation or interaction.  Problem Solving Problem solving assist level: Solves basic 90% of the time/requires cueing < 10% of the time  Memory Memory assist level: Recognizes or recalls 90% of the time/requires cueing < 10% of the time    Medical Problem List and Plan: 1. Abnormality of gait secondary to left AKA.  Cont CIR therapies 2. DVT Prophylaxis/Anticoagulation: Pharmaceutical: Lovenox 3. Pain Management: Continue oxycodone prn and robaxin tid. Added low dose Neurontin to help with neuropathy.  4. Mood: LCSW to follow for evaluation and support.  5. Neuropsych: This patient is capable of making decisions on his own behalf. 6. Skin/Wound Care:   -continue BID dressing with telfa,4x4,kerlix, ACE to keep wound dry.  Prevalon boot for right heel pain.   Pressure relief to sacrum 7. Fluids/Electrolytes/Nutrition: Monitor I/O. Added supplements as intake has been poor.  8. T2DM poor; controlled: Hgb A1c- 8.0. Monitor BS ac/hs.   Continue glucotrol bid   Levemir decreased to 15U qhs on 6/29, decreased at to 10mg  on qhs on 7/3.  Will start Levamir 5U daily  on 7/3  Still somewhat labile, will monitor today and consider further adjustments tomorrow.  9. CAD: Monitor for symptoms with activity. On Imdur, ASA and Lipitor.  10. Leucocytosis:   Afebrile  WBCs 11.7 on 7/3, stable  Monitor for signs of infection.   Encourage IS.  11. Acute on chronic renal failure:   Cr. 2.38 on 7/3   Encourage fluids, will consider IVF if necessary 12. Acute on chronic anemia: Continue iron supplement.   Hb 10.3 on 7/3 13. GERD: Symptoms controlled on protonix.  14. HTN: Monitor BP.   Continue Norvasc  Cont Furosemide  Due to AM elevations, Hydralazine increased to 75mg  BID on 6/30 increased to 75mg  TID on 7/3.   LOS (Days) 6 A FACE TO FACE EVALUATION WAS PERFORMED  Ankit Lorie Phenix 02/16/2016 8:06 AM

## 2016-02-17 ENCOUNTER — Inpatient Hospital Stay (HOSPITAL_COMMUNITY): Payer: BLUE CROSS/BLUE SHIELD | Admitting: Physical Therapy

## 2016-02-17 ENCOUNTER — Inpatient Hospital Stay (HOSPITAL_COMMUNITY): Payer: BLUE CROSS/BLUE SHIELD | Admitting: Occupational Therapy

## 2016-02-17 LAB — GLUCOSE, CAPILLARY
GLUCOSE-CAPILLARY: 172 mg/dL — AB (ref 65–99)
GLUCOSE-CAPILLARY: 61 mg/dL — AB (ref 65–99)
GLUCOSE-CAPILLARY: 176 mg/dL — AB (ref 65–99)
Glucose-Capillary: 106 mg/dL — ABNORMAL HIGH (ref 65–99)
Glucose-Capillary: 88 mg/dL (ref 65–99)

## 2016-02-17 LAB — BASIC METABOLIC PANEL
Anion gap: 8 (ref 5–15)
BUN: 96 mg/dL — AB (ref 6–20)
CHLORIDE: 104 mmol/L (ref 101–111)
CO2: 24 mmol/L (ref 22–32)
CREATININE: 2.31 mg/dL — AB (ref 0.61–1.24)
Calcium: 9 mg/dL (ref 8.9–10.3)
GFR calc Af Amer: 28 mL/min — ABNORMAL LOW (ref >60)
GFR calc non Af Amer: 24 mL/min — ABNORMAL LOW (ref >60)
Glucose, Bld: 86 mg/dL (ref 65–99)
POTASSIUM: 4.4 mmol/L (ref 3.5–5.1)
Sodium: 136 mmol/L (ref 135–145)

## 2016-02-17 MED ORDER — INSULIN ASPART 100 UNIT/ML ~~LOC~~ SOLN
2.0000 [IU] | Freq: Three times a day (TID) | SUBCUTANEOUS | Status: DC
  Administered 2016-02-18 – 2016-02-19 (×5): 2 [IU] via SUBCUTANEOUS
  Administered 2016-02-20: 4 [IU] via SUBCUTANEOUS

## 2016-02-17 MED ORDER — SODIUM CHLORIDE 0.9 % IV SOLN
INTRAVENOUS | Status: DC
  Administered 2016-02-17: 17:00:00 via INTRAVENOUS

## 2016-02-17 MED ORDER — PRO-STAT SUGAR FREE PO LIQD
30.0000 mL | Freq: Every day | ORAL | Status: DC
  Administered 2016-02-18 – 2016-02-23 (×6): 30 mL via ORAL
  Filled 2016-02-17 (×6): qty 30

## 2016-02-17 NOTE — Progress Notes (Signed)
Nutrition Follow-up  DOCUMENTATION CODES:   Non-severe (moderate) malnutrition in context of chronic illness  INTERVENTION:  Continue Glucerna Shake po BID, each supplement provides 220 kcal and 10 grams of protein.  Provide 30 ml Prostat po once daily, each supplement provides 100 kcal and 15 grams of protein.   Encourage adequate PO intake.   NUTRITION DIAGNOSIS:   Malnutrition related to chronic illness as evidenced by energy intake < 75% for > or equal to 1 month, moderate depletions of muscle mass; ongoing  GOAL:   Patient will meet greater than or equal to 90% of their needs; met  MONITOR:   PO intake, Supplement acceptance, Weight trends, Labs, I & O's, Skin  REASON FOR ASSESSMENT:   Consult Poor PO  ASSESSMENT:   82 y.o. Male with history of HTN, DM, PAF, CAD with AS s/p CABG with AVR, CKD stage IV, PAD s/p B-carotid stents and history of calcified vessels BLE. He developed gangrenous changes left foot with severe erythema and skin breakdown of pretibial tissue with severe pain X 1 month and AKA recommended due to inability to salvage limb. He was admitted on 06/26 for L-AKA by Dr. Donnetta Hutching. Post op with phantom pain as well as muscle spasms.  Meal completion has been 50-85%. Intake has been improving. Pt currently has Glucerna and Prostat ordered and has been consuming them. RD to modify orders as intake has been improving. Labs and medications reviewed.   Diet Order:  Diet Carb Modified Fluid consistency:: Thin; Room service appropriate?: Yes  Skin:  Wound (see comment) (Stage I pressure ulcer on sacrum, L AKA)  Last BM:  7/3  Height:   Ht Readings from Last 1 Encounters:  02/05/16 5' 5.5" (1.664 m)    Weight:   Wt Readings from Last 1 Encounters:  02/17/16 129 lb 10.1 oz (58.8 kg)    Ideal Body Weight:  58 kg (adjusted for L AKA)  BMI:  Body mass index is 21.24 kg/(m^2).  Estimated Nutritional Needs:   Kcal:  1700-1850  Protein:  75-85  grams  Fluid:  1.7 - 1.8 L/day  EDUCATION NEEDS:   Education needs addressed  Corrin Parker, MS, RD, LDN Pager # [phone] After hours/ weekend pager # [phone]

## 2016-02-17 NOTE — Progress Notes (Signed)
Patient ID: Collin Pearson, male   DOB: 14-Jul-1927, 82 y.o.   MRN: [id number] Currently working with physical therapist standing his walker. He is in good spirits. Therapist reports that he is being able to transfer with minimal assistance. I did not examine his left above-knee amputation stump. No reports of difficulty with healing. Following from sidelines.

## 2016-02-17 NOTE — Progress Notes (Signed)
Physical Therapy Session Note  Patient Details  Name: Collin Pearson MRN: [id number] Date of Birth: [birth year]  Today's Date: 02/17/2016 PT Individual Time: 0800-0901 AND [id number] AND 1630-1700 PT Individual Time Calculation (min): 61 min AND 31 min AND 30 min   Short Term Goals: Week 1:  PT Short Term Goal 1 (Week 1): Pt will roll R/L with Min A for skin protection in bed PT Short Term Goal 2 (Week 1): Pt will move supine<>sit with Min A PT Short Term Goal 3 (Week 1): Pt will perform basic bed<>transfer with consistent Mod A PT Short Term Goal 4 (Week 1): Pt will ambulate 5' with Min A and RW PT Short Term Goal 5 (Week 1): Pt will initiate stair training Week 2:     Skilled Therapeutic Interventions/Progress Updates:    Patient received sitting EOB eating breakfast. MD present for assessment. Patient performed sit<>stand with RW for MD to assess Sacrum. Patient performed urination in urinal sitting EOB following MD assessment. PT assisted patient in dressing with mod-max A for Lower body and supervision A for upper body. Patient performed sit<>stand x 2 to don underpants and pants with mod A. Stand pivot transfer with RW and MinA-Mod A from PT with min cues for UE placement and AD management x 6 throughout treatment.  Patient performed WC mobility in hall for 138ft x 2 without assist from PT on first bout and supervision A on second bout with min cues for obstacle navigation in tight space of room.   Gait training 13ft x 2 with RW and minA progressing to Mod A for last 35ft on first bout and min A throughout on second bout from PT. Min cues for improved AD management and improve swing to gait pattern and sequencing with turns.   Session 2:  Patient received sitting in WC and agreeable to PT. Session focused on stair/step management for improved access to home environment.   Patient performed step training on 2 inch step x 2 in parallel bars with mod  Progressing to min A. Step training  on 4 inch step x 2 with mod A form PT. Mod cues provided by for proper UE placement and push though BUE. Patient was noted to have an incontinent bladder episode while performing step training. PT returned pt to room and assisted with don and doff lower body clothing with max A for clothing management and min A for sit<>stand with RW. PT provided min cues for improved UE positioning and anterior weight shift in standing position.  Patient left in [facility] with call bell within reach.   Session 3:   Patient received sitting in recliner and agreeable to PT. Patient performed stand pivot transfer with RW with mod A from PT with mod cues for proper positioning prior to transfer and proper AD management and step pattern to allow improved success with transfer. Patient performed WC mobility for 111ft with supervision A from PT with min cues for obstacle navigation in room.  Patient performed stand pivot transfer with RW, min A x 2 and mod A x 2. Constant cues required for AD management and improved swing to gait pattern. Sit<>stand also performed x 4 throughout treatment session with min A form PT and mod cues for improved positioning prior transfer.   Gait training performed x 8 ft with min A from PT with Max A at end of gait for transfer into chair 2/2 UE fatigue. Mod cues for improved swing to gait pattern and proper RW positioning  with each step.   Patient returned to room and left sitting in [facility] with call bell within reach.       Therapy Documentation Precautions:  Precautions Precautions: Fall Restrictions Weight Bearing Restrictions: Yes LLE Weight Bearing: Non weight bearing General:   Vital Signs: Therapy Vitals Temp: 98.1 F (36.7 C) Temp Source: Oral Pulse Rate: 76 Resp: 18 BP: (!) 156/57 mmHg Patient Position (if appropriate): Lying Oxygen Therapy SpO2: 93 % O2 Device: Not Delivered Pain: Pain Assessment Pain Assessment: 0-10 Pain Score: 3  Pain Location: Leg Pain  Orientation: Left Pain Descriptors / Indicators: Aching Pain Intervention(s): Ambulation/increased activity  See Function Navigator for Current Functional Status.   Therapy/Group: Individual Therapy  Lorie Phenix 02/17/2016, 9:06 AM

## 2016-02-17 NOTE — Patient Care Conference (Signed)
Inpatient RehabilitationTeam Conference and Plan of Care Update Date: 02/17/2016   Time: 11:30 AM    Patient Name: Collin Pearson      Medical Record Number: [id number]  Date of Birth: 1926-10-10 Sex: Male         Room/Bed: 4M03C/4M03C-01 Payor Info: Payor: MEDICARE / Plan: MEDICARE PART A / Product Type: *No Product type* /    Admitting Diagnosis: L AKA  Admit Date/Time:  02/10/2016  4:18 PM Admission Comments: No comment available   Primary Diagnosis:  <principal problem not specified> Principal Problem: <principal problem not specified>  Patient Active Problem List   Diagnosis Date Noted  . CKD (chronic kidney disease)   . Unilateral AKA ([place]) 02/10/2016  . Gastroesophageal reflux disease with esophagitis   . Anemia of chronic disease   . AKI (acute kidney injury) ([place])   . Status post below knee amputation of left lower extremity ([place])   . Coronary artery disease involving coronary bypass graft of native heart without angina pectoris   . S/P AVR   . Benign essential HTN   . Diabetes mellitus type 2 in nonobese (H[facility])   . S/p bilateral carotid endarterectomy   . Chronic kidney disease (CKD), stage IV (severe) ([place])   . Acute blood loss anemia   . Post-operative pain   . Leukocytosis   . PAD (peripheral artery disease) ([place]) 02/08/2016  . Acute on chronic diastolic CHF (congestive heart failure), NYHA class 1 ([place]) 02/09/2015  . Physical deconditioning   . Acute on chronic renal failure ([place]) 02/07/2015  . Atrial fibrillation ([place]) 02/07/2015  . Bradycardia 02/04/2015  . Encounter for nasogastric tube placement   . Endotracheally intubated   . Hypoxia   . Pulmonary edema   . Peripheral arterial disease ([place]) 08/22/2014  . Carotid artery stenosis,Last dopplers 09/20/12 05/29/2013  . Ventral hernia 10/04/2012  . PAF (paroxysmal atrial fibrillation), s/p cabg 06/18/2012  . S/P CABG x 1 with tissue AVR 06/13/12 06/14/2012  . Rib fractures, several, left side  06/11/2012  .  CKD (chronic kidney disease) stage 4, GFR 15-29 ml/min (HCC) 06/11/2012  . HTN (hypertension) 06/06/2012  . Dyslipidemia 06/06/2012  . Diabetes mellitus ([place]) 06/06/2012  . NSTEMI (non-ST elevated myocardial infarction) ([place]) 06/06/2012  . Chronic anemia 06/06/2012  . Acute pulmonary edema ([place]) 06/06/2012  . PVD, with hx of bilateral CEA at [facility], and previous bilateral carotid stenting, with high grade ISR of Rt. carotid 05/18/12 05/18/2012  . Angina effort ([place]) 05/18/2012  . Clotting disorder ([place])   . CAD, patent stent to LAD and mid RCA with occluded OM2 branch     . AS (aortic stenosis), critical stenosis     Expected Discharge Date: Expected Discharge Date: 02/24/16  Team Members Present: Physician leading conference: Dr. Delice Lesch Social Worker Present: Ovidio Kin, LCSW Nurse Present: Dorien Chihuahua, RN PT Present: Lavone Nian, Rory Percy, PT OT Present: Willeen Cass, OT SLP Present: Gunnar Fusi, SLP PPS Coordinator present : Daiva Nakayama, RN, CRRN     Current Status/Progress Goal Weekly Team Focus  Medical   Abnormality of gait secondary to left AKA  Improve safety, mobility, endurance, labs  See above   Bowel/Bladder   Cont B&B LBM 02/16/16  To remain continent of B&B  Monitor B&B function    Swallow/Nutrition/ Hydration             ADL's             Mobility   moves extremely slowly,  mod a for squat pivot transfers, supervision for w/c mobility in linear controlled environment  supervision for standing balance with LRAD, car transfers with Min A, w/c mobility with supervision, ambulation with LRAD & supervision  pt & family education (positioning, contractures, d/c planning), w/c mobility, strengthening, endurance training, standing   Communication             Safety/Cognition/ Behavioral Observations            Pain   C/o Lt AKA pain. Oxy IR 10mg  given prn.   Pain<3  Assess and treat pain as needed during every shift   Skin   Bottom red  and irritated-Mircoguard powder applied, Lt. AKA incision-staples CDI gauze and Kerlex  No new skin breakdown/infection  Assess skin during every shift      *See Care Plan and progress notes for long and short-term goals.  Barriers to Discharge: AKI/CKD, HTN, DM    Possible Resolutions to Barriers:  Follow labs, IVF, optimize BP and DM meds    Discharge Planning/Teaching Needs:  Daughter here daily but questioning whether can provide care at home. Discussed [employer] will probably not cover going to a NH after rehab      Team Discussion:  Goals-min assist level-extremely slow in his tasks and needs much time to complete. IV fluids tonight for his kidneys. Fatigues easily and needs many rest breaks. MD-adjusting diabetes medications. Trying to build up his strength. Will begin family training.   Revisions to Treatment Plan:  Downgraded PT goals to min assist level   Continued Need for Acute Rehabilitation Level of Care: The patient requires daily medical management by a physician with specialized training in physical medicine and rehabilitation for the following conditions: Daily direction of a multidisciplinary physical rehabilitation program to ensure safe treatment while eliciting the highest outcome that is of practical value to the patient.: Yes Daily medical management of patient stability for increased activity during participation in an intensive rehabilitation regime.: Yes Daily analysis of laboratory values and/or radiology reports with any subsequent need for medication adjustment of medical intervention for : Wound care problems;Post surgical problems;Renal problems;Blood pressure problems  Malone Vanblarcom, Gardiner Rhyme 02/17/2016, 1:13 PM

## 2016-02-17 NOTE — Progress Notes (Signed)
Social Work Elease Hashimoto, LCSW Social Worker Signed  Patient Care Conference 02/17/2016  1:13 PM    Expand All Collapse All   Inpatient RehabilitationTeam Conference and Plan of Care Update Date: 02/17/2016   Time: 11:30 AM     Patient Name: Collin Pearson       Medical Record Number: [id number]  Date of Birth: 10-29-1926 Sex: Male         Room/Bed: 4M03C/4M03C-01 Payor Info: Payor: MEDICARE / Plan: MEDICARE PART A / Product Type: *No Product type* /    Admitting Diagnosis: L AKA   Admit Date/Time:  02/10/2016  4:18 PM Admission Comments: No comment available   Primary Diagnosis:  <principal problem not specified> Principal Problem: <principal problem not specified>    Patient Active Problem List     Diagnosis  Date Noted   .  CKD (chronic kidney disease)     .  Unilateral AKA ([place])  02/10/2016   .  Gastroesophageal reflux disease with esophagitis     .  Anemia of chronic disease     .  AKI (acute kidney injury) ([place])     .  Status post below knee amputation of left lower extremity ([place])     .  Coronary artery disease involving coronary bypass graft of native heart without angina pectoris     .  S/P AVR     .  Benign essential HTN     .  Diabetes mellitus type 2 in nonobese (HCC)     .  S/p bilateral carotid endarterectomy     .  Chronic kidney disease (CKD), stage IV (severe) ([place])     .  Acute blood loss anemia     .  Post-operative pain     .  Leukocytosis     .  PAD (peripheral artery disease) ([place])  02/08/2016   .  Acute on chronic diastolic CHF (congestive heart failure), NYHA class 1 ([place])  02/09/2015   .  Physical deconditioning     .  Acute on chronic renal failure ([place])  02/07/2015   .  Atrial fibrillation ([place])  02/07/2015   .  Bradycardia  02/04/2015   .  Encounter for nasogastric tube placement     .  Endotracheally intubated     .  Hypoxia     .  Pulmonary edema     .  Peripheral arterial disease ([place])  08/22/2014   .  Carotid artery stenosis,Last  dopplers 09/20/12  05/29/2013   .  Ventral hernia  10/04/2012   .  PAF (paroxysmal atrial fibrillation), s/p cabg  06/18/2012   .  S/P CABG x 1 with tissue AVR 06/13/12  06/14/2012   .  Rib fractures, several, left side   06/11/2012   .  CKD (chronic kidney disease) stage 4, GFR 15-29 ml/min (HCC)  06/11/2012   .  HTN (hypertension)  06/06/2012   .  Dyslipidemia  06/06/2012   .  Diabetes mellitus ([place])  06/06/2012   .  NSTEMI (non-ST elevated myocardial infarction) ([place])  06/06/2012   .  Chronic anemia  06/06/2012   .  Acute pulmonary edema ([place])  06/06/2012   .  PVD, with hx of bilateral CEA at [facility], and previous bilateral carotid stenting, with high grade ISR of Rt. carotid 05/18/12  05/18/2012   .  Angina effort ([place])  05/18/2012   .  Clotting disorder ([place])     .  CAD, patent stent to LAD  and mid RCA with occluded OM2 branch       .  AS (aortic stenosis), critical stenosis       Expected Discharge Date: Expected Discharge Date: 02/24/16  Team Members Present: Physician leading conference: Dr. Delice Lesch Social Worker Present: Ovidio Kin, LCSW Nurse Present: Dorien Chihuahua, RN PT Present: Lavone Nian, Rory Percy, PT OT Present: Willeen Cass, OT SLP Present: Gunnar Fusi, SLP PPS Coordinator present : Daiva Nakayama, RN, CRRN        Current Status/Progress  Goal  Weekly Team Focus   Medical     Abnormality of gait secondary to left AKA  Improve safety, mobility, endurance, labs  See above   Bowel/Bladder     Cont B&B LBM 02/16/16  To remain continent of B&B  Monitor B&B function    Swallow/Nutrition/ Hydration               ADL's               Mobility     moves extremely slowly, mod a for squat pivot transfers, supervision for w/c mobility in linear controlled environment  supervision for standing balance with LRAD, car transfers with Min A, w/c mobility with supervision, ambulation with LRAD & supervision  pt & family education (positioning, contractures, d/c  planning), w/c mobility, strengthening, endurance training, standing    Communication               Safety/Cognition/ Behavioral Observations              Pain     C/o Lt AKA pain. Oxy IR 10mg  given prn.   Pain<3  Assess and treat pain as needed during every shift    Skin     Bottom red and irritated-Mircoguard powder applied, Lt. AKA incision-staples CDI gauze and Kerlex  No new skin breakdown/infection  Assess skin during every shift      *See Care Plan and progress notes for long and short-term goals.    Barriers to Discharge:  AKI/CKD, HTN, DM     Possible Resolutions to Barriers:   Follow labs, IVF, optimize BP and DM meds     Discharge Planning/Teaching Needs:   Daughter here daily but questioning whether can provide care at home. Discussed [employer] will probably not cover going to a NH after rehab        Team Discussion:    Goals-min assist level-extremely slow in his tasks and needs much time to complete. IV fluids tonight for his kidneys. Fatigues easily and needs many rest breaks. MD-adjusting diabetes medications. Trying to build up his strength. Will begin family training.    Revisions to Treatment Plan:    Downgraded PT goals to min assist level    Continued Need for Acute Rehabilitation Level of Care: The patient requires daily medical management by a physician with specialized training in physical medicine and rehabilitation for the following conditions: Daily direction of a multidisciplinary physical rehabilitation program to ensure safe treatment while eliciting the highest outcome that is of practical value to the patient.: Yes Daily medical management of patient stability for increased activity during participation in an intensive rehabilitation regime.: Yes Daily analysis of laboratory values and/or radiology reports with any subsequent need for medication adjustment of medical intervention for : Wound care problems;Post surgical problems;Renal problems;Blood pressure  problems  Elease Hashimoto 02/17/2016, 1:13 PM                  Patient ID: Collin Lou  Pearson, male   DOB: [birth year], 82 y.o.   MRN: [id number]

## 2016-02-17 NOTE — Progress Notes (Signed)
Physical Therapy Session Note  Patient Details  Name: Collin Pearson MRN: [id number] Date of Birth: February 02, 1927  Today's Date: 02/17/2016 PT Individual Time: [id number] PT Individual Time Calculation (min): 40 min   Short Term Goals: Week 1:  PT Short Term Goal 1 (Week 1): Pt will roll R/L with Min A for skin protection in bed PT Short Term Goal 2 (Week 1): Pt will move supine<>sit with Min A PT Short Term Goal 3 (Week 1): Pt will perform basic bed<>transfer with consistent Mod A PT Short Term Goal 4 (Week 1): Pt will ambulate 31' with Min A and RW PT Short Term Goal 5 (Week 1): Pt will initiate stair training  Skilled Therapeutic Interventions/Progress Updates:    Pt received in w/c & agreeable to PT, noting only 2/10 LLE pain. Pt propelled w/c x 100 ft with supervision for obstacle avoidance room>rehab apartment & set up w/c by bed. Pt able to transfer sit>stand with min A & armrests but required mod A for stand>sit 2/2 poor eccentric control. Stand pivot w/c<>bed with RW & Mod A with cuing for sequencing of RW & hops; educated pt on need to lift with BUE instead of hopping with RLE & pt with poor R foot clearance. Pt experienced R mild knee buckling on one occasion following sit>stand. Pt able to roll L<>R & transfer supine<>sit with supervision but required Min A for prone positioning. Prone lying x 8 minutes for L hip flexor stretch with pt c/o tightness in LLE that decreased with time. Reinforced need to limit L hip flexion to prevent contracture development. Pt completed stand pivot bed>w/c with RW in same manner as noted above. At end of session pt left in w/c in room with QRB in place, set up with lunch tray & all needs within reach.  During session PT & pt attempted to problem solve ways to allow him to be more prepared for each therapy session as pt was not finished with lunch tray upon PT arrival on this date. Pt reports he has an infected tooth that inhibits his ability to chew; PT  notified RN of this.  Therapy Documentation Precautions:  Precautions Precautions: Fall Restrictions Weight Bearing Restrictions: Yes (L AKA) LLE Weight Bearing: Non weight bearing  Pain: Pain Assessment Pain Assessment: 0-10 Pain Score: 2  Pain Location:  (residual limb) Pain Orientation: Left   See Function Navigator for Current Functional Status.   Therapy/Group: Individual Therapy  [name] 02/17/2016, 12:22 PM

## 2016-02-17 NOTE — Progress Notes (Signed)
Occupational Therapy Session Note  Patient Details  Name: Collin Pearson MRN: [id number] Date of Birth: 01-02-1927  Today's Date: 02/17/2016 OT Individual Time: [id number]   OT Individual Time Calculation (min): 68 min    Skilled Therapeutic Interventions/Progress Updates:    Pt participated in skilled OT tx focusing on improving standing balance for decreasing level of assistance for self care. Pt completed functional toilet transfer via stand pivot with grab bars and Min A with max vcs on proper hand placement. Pt was able to complete hygiene mgt with instruction on lateral leaning. Pt completed UB dressing with jacket with supervision and instruction on adaptive overhead technique. Pt was brought to gym to work on standing balance with RW and visual biofeedback to correct posture and extend R knee. Pt able to extend right knee with manual cues and maintain standing balance with Mod A. Pt reported still not finding base of support when standing due to missing LE. Pt also reported increased soreness in L UE when engaged in unilateral task with RW and Mod A. Pt also reported 6/10 pain in L LE when standing today. With rest breaks pain was lowered to 2/10, which pt reported being as baseline. Pt completed 4-5 trials of standing while completing activity with unilateral support with RW. Pt required Min A for sit to stands with RW. At end of tx, pt returned to room and transferred to chair stand step with RW and Min A. QRB and all needs within reach. Pt left with visitor.   Therapy Documentation Precautions:  Precautions Precautions: Fall Restrictions Weight Bearing Restrictions: Yes (L AKA) LLE Weight Bearing: Non weight bearing General:   Vital Signs: Therapy Vitals Temp: 98.6 F (37 C) Temp Source: Oral Pulse Rate: 74 Resp: 16 BP: (!) 126/54 mmHg Patient Position (if appropriate): Sitting Oxygen Therapy SpO2: 98 % O2 Device: Not Delivered Pain: 6/10 when standing 2/10 with sitting  rest breaks   ADL: ADL Eating: Modified independent Where Assessed-Eating: Wheelchair Grooming: Setup Where Assessed-Grooming: Wheelchair Upper Body Bathing: Minimal assistance Where Assessed-Upper Body Bathing: Wheelchair Lower Body Bathing: Moderate assistance Where Assessed-Lower Body Bathing: Wheelchair Upper Body Dressing: Moderate assistance Where Assessed-Upper Body Dressing: Wheelchair Lower Body Dressing: Other (Comment) (2 helpers) Where Assessed-Lower Body Dressing: Wheelchair Toileting: Not assessed Toilet Transfer: Not assessed Toilet Transfer Method: Unable to assess Tub/Shower Transfer: Not assessed    See Function Navigator for Current Functional Status.   Therapy/Group: Individual Therapy  Chais Fehringer A Aizik Reh 02/17/2016, 4:06 PM

## 2016-02-17 NOTE — Progress Notes (Signed)
[facility] PHYSICAL MEDICINE & REHABILITATION     PROGRESS NOTE  Subjective/Complaints:  Pt seen working with therapies.  He slept well overnight and had a good breakfast.    ROS:  Denies CP, SOB, N/V/D.  Objective: Vital Signs: Blood pressure 156/57, pulse 76, temperature 98.1 F (36.7 C), temperature source Oral, resp. rate 18, weight 58.8 kg (129 lb 10.1 oz), SpO2 93 %. No results found.  Recent Labs  02/15/16 0842  WBC 11.7*  HGB 10.3*  HCT 33.4*  PLT 340    Recent Labs  02/15/16 0842 02/17/16 0541  NA 135 136  K 4.3 4.4  CL 104 104  GLUCOSE 131* 86  BUN 90* 96*  CREATININE 2.38* 2.31*  CALCIUM 9.1 9.0   CBG (last 3)   Recent Labs  02/16/16 1637 02/16/16 2045 02/17/16 0634  GLUCAP 227* 191* 88    Wt Readings from Last 3 Encounters:  02/17/16 58.8 kg (129 lb 10.1 oz)  02/10/16 58.015 kg (127 lb 14.4 oz)  02/05/16 63.05 kg (139 lb)    Physical Exam:  BP 156/57 mmHg  Pulse 76  Temp(Src) 98.1 F (36.7 C) (Oral)  Resp 18  Wt 58.8 kg (129 lb 10.1 oz)  SpO2 93% Constitutional:  He appears well-developed. Frail.   HENT: Normocephalic and atraumatic.  Eyes: Conjunctivae and EOM are normal.   Cardiovascular: Normal rate and regular rhythm.Murmur heard. Respiratory: Breath sounds normal. No stridor. He has no wheezes.  GI: Soft. Bowel sounds are normal. He exhibits no distension. There is no tenderness.  Musculoskeletal: He exhibits no tenderness. He exhibits no edema.  Neurological: He is alert and oriented.  Motor: B/l UE, RLE 5/5 proximal to distal LLE: Hip flexion 5/5  Skin: Skin is warm and dry.  Surgical incision c/d/i, mild erythema lateral edge.   Some ischemic changes to right lower extremity  Coccyx with stage I Psychiatric: He has a normal mood and affect. His behavior is normal.    Assessment/Plan: 1. Functional deficits secondary to left AKA which require 3+ hours per day of interdisciplinary therapy in a comprehensive inpatient  rehab setting. Physiatrist is providing close team supervision and 24 hour management of active medical problems listed below. Physiatrist and rehab team continue to assess barriers to discharge/monitor patient progress toward functional and medical goals.  Function:  Bathing Bathing position   Position: Wheelchair/chair at sink  Bathing parts Body parts bathed by patient: Right arm, Left arm, Chest, Abdomen, Front perineal area, Right upper leg, Right lower leg, Left upper leg Body parts bathed by helper: Buttocks, Back  Bathing assist Assist Level: Touching or steadying assistance(Pt > 75%)      Upper Body Dressing/Undressing Upper body dressing   What is the patient wearing?: Button up shirt         Button up shirt - Perfomed by patient: Thread/unthread left sleeve, Pull shirt around back, Button/unbutton shirt Button up shirt - Perfomed by helper: Thread/unthread right sleeve    Upper body assist Assist Level: Touching or steadying assistance(Pt > 75%)      Lower Body Dressing/Undressing Lower body dressing Lower body dressing/undressing activity did not occur: N/A What is the patient wearing?: Underwear, Pants, Non-skid slipper socks Underwear - Performed by patient: Thread/unthread right underwear leg, Thread/unthread left underwear leg Underwear - Performed by helper: Pull underwear up/down Pants- Performed by patient: Thread/unthread left pants leg, Fasten/unfasten pants, Thread/unthread right pants leg Pants- Performed by helper: Pull pants up/down   Non-skid slipper socks- Performed by  helper: Don/doff right sock   Socks - Performed by helper: Don/doff right sock              Lower body assist Assist for lower body dressing: Touching or steadying assistance (Pt > 75%)      Toileting Toileting     Toileting steps completed by helper: Adjust clothing prior to toileting, Performs perineal hygiene, Adjust clothing after toileting Toileting Assistive Devices:  Grab bar or rail  Toileting assist Assist level: Two helpers   Transfers Chair/bed transfer   Chair/bed transfer method: Squat pivot Chair/bed transfer assist level: Moderate assist (Pt 50 - 74%/lift or lower) Chair/bed transfer assistive device: Bedrails, Armrests     Locomotion Ambulation     Max distance: 10 ft Assist level: Moderate assist (Pt 50 - 74%)   Wheelchair   Type: Manual Max wheelchair distance: 150 ft Assist Level: Supervision or verbal cues  Cognition Comprehension Comprehension assist level: Follows basic conversation/direction with extra time/assistive device  Expression Expression assist level: Expresses basic 90% of the time/requires cueing < 10% of the time.  Social Interaction Social Interaction assist level: Interacts appropriately 90% of the time - Needs monitoring or encouragement for participation or interaction.  Problem Solving Problem solving assist level: Solves basic 90% of the time/requires cueing < 10% of the time  Memory Memory assist level: Recognizes or recalls 90% of the time/requires cueing < 10% of the time    Medical Problem List and Plan: 1. Abnormality of gait secondary to left AKA.  Cont CIR therapies 2. DVT Prophylaxis/Anticoagulation: Pharmaceutical: Lovenox 3. Pain Management: Continue oxycodone prn and robaxin tid. Added low dose Neurontin to help with neuropathy.  4. Mood: LCSW to follow for evaluation and support.  5. Neuropsych: This patient is capable of making decisions on his own behalf. 6. Skin/Wound Care:   -continue BID dressing with telfa,4x4,kerlix, ACE to keep wound dry.  Prevalon boot for right heel pain.   Pressure relief to sacrum 7. Fluids/Electrolytes/Nutrition: Monitor I/O. Added supplements as intake has been poor.  8. T2DM poor; controlled: Hgb A1c- 8.0. Monitor BS ac/hs.   Continue glucotrol bid   Levemir decreased to 15U qhs on 6/29, decreased at to 10mg  on qhs on 7/3.  Started Levamir 5U daily on  7/3, d/ced on 7/6  Novolog 2U TID started on 7/6   Will cont to monitor 9. CAD: Monitor for symptoms with activity. On Imdur, ASA and Lipitor.  10. Leucocytosis:   Afebrile  WBCs 11.7 on 7/3, stable  Monitor for signs of infection.   Encourage IS.  11. Acute on chronic renal failure:   Cr. 2.31 on 7/5   Encourage fluids, will order IVF tonight and recheck tomorrow 12. Acute on chronic anemia: Continue iron supplement.   Hb 10.3 on 7/3 13. GERD: Symptoms controlled on protonix.  14. HTN: Monitor BP.   Continue Norvasc  Cont Furosemide  Due to AM elevations, Hydralazine increased to 75mg  BID on 6/30 increased to 75mg  TID on 7/3.   Slightly labile, however, controlled for the most part  LOS (Days) 7 A FACE TO FACE EVALUATION WAS PERFORMED  Buelah Rennie Lorie Phenix 02/17/2016 8:45 AM

## 2016-02-18 ENCOUNTER — Inpatient Hospital Stay (HOSPITAL_COMMUNITY): Payer: BLUE CROSS/BLUE SHIELD | Admitting: Physical Therapy

## 2016-02-18 ENCOUNTER — Inpatient Hospital Stay (HOSPITAL_COMMUNITY): Payer: BLUE CROSS/BLUE SHIELD

## 2016-02-18 ENCOUNTER — Inpatient Hospital Stay (HOSPITAL_COMMUNITY): Payer: BLUE CROSS/BLUE SHIELD | Admitting: Occupational Therapy

## 2016-02-18 DIAGNOSIS — K089 Disorder of teeth and supporting structures, unspecified: Secondary | ICD-10-CM | POA: Insufficient documentation

## 2016-02-18 LAB — CBC WITH DIFFERENTIAL/PLATELET
BASOS ABS: 0 10*3/uL (ref 0.0–0.1)
BASOS PCT: 0 %
EOS ABS: 0.6 10*3/uL (ref 0.0–0.7)
Eosinophils Relative: 6 %
HCT: 30 % — ABNORMAL LOW (ref 39.0–52.0)
HEMOGLOBIN: 9.3 g/dL — AB (ref 13.0–17.0)
Lymphocytes Relative: 15 %
Lymphs Abs: 1.3 10*3/uL (ref 0.7–4.0)
MCH: 29.9 pg (ref 26.0–34.0)
MCHC: 31 g/dL (ref 30.0–36.0)
MCV: 96.5 fL (ref 78.0–100.0)
Monocytes Absolute: 1 10*3/uL (ref 0.1–1.0)
Monocytes Relative: 10 %
NEUTROS PCT: 69 %
Neutro Abs: 6.4 10*3/uL (ref 1.7–7.7)
Platelets: 274 10*3/uL (ref 150–400)
RBC: 3.11 MIL/uL — AB (ref 4.22–5.81)
RDW: 15.3 % (ref 11.5–15.5)
WBC: 9.3 10*3/uL (ref 4.0–10.5)

## 2016-02-18 LAB — GLUCOSE, CAPILLARY
GLUCOSE-CAPILLARY: 64 mg/dL — AB (ref 65–99)
GLUCOSE-CAPILLARY: 99 mg/dL (ref 65–99)
GLUCOSE-CAPILLARY: 186 mg/dL — AB (ref 65–99)
GLUCOSE-CAPILLARY: 192 mg/dL — AB (ref 65–99)
Glucose-Capillary: 117 mg/dL — ABNORMAL HIGH (ref 65–99)

## 2016-02-18 LAB — BASIC METABOLIC PANEL
ANION GAP: 7 (ref 5–15)
BUN: 82 mg/dL — ABNORMAL HIGH (ref 6–20)
CALCIUM: 8.7 mg/dL — AB (ref 8.9–10.3)
CO2: 20 mmol/L — ABNORMAL LOW (ref 22–32)
CREATININE: 2.05 mg/dL — AB (ref 0.61–1.24)
Chloride: 111 mmol/L (ref 101–111)
GFR calc non Af Amer: 27 mL/min — ABNORMAL LOW (ref >60)
GFR, EST AFRICAN AMERICAN: 32 mL/min — AB (ref >60)
Glucose, Bld: 62 mg/dL — ABNORMAL LOW (ref 65–99)
Potassium: 4 mmol/L (ref 3.5–5.1)
SODIUM: 138 mmol/L (ref 135–145)

## 2016-02-18 MED ORDER — SODIUM CHLORIDE 0.9 % IV SOLN
Freq: Every day | INTRAVENOUS | Status: AC
  Administered 2016-02-18 – 2016-02-20 (×3): via INTRAVENOUS

## 2016-02-18 MED ORDER — INSULIN DETEMIR 100 UNIT/ML ~~LOC~~ SOLN
5.0000 [IU] | Freq: Every day | SUBCUTANEOUS | Status: DC
  Administered 2016-02-18 – 2016-02-21 (×4): 5 [IU] via SUBCUTANEOUS
  Filled 2016-02-18 (×5): qty 0.05

## 2016-02-18 NOTE — Progress Notes (Signed)
Occupational Therapy Session Note  Patient Details  Name: Collin Pearson MRN: [id number] Date of Birth: 07-13-1927  Today's Date: 02/18/2016 OT Individual Time: 1103-1204 OT Individual Time Calculation (min): 61 min    Skilled Therapeutic Interventions/Progress Updates:    Pt participated in skilled OT intervention focusing on use of lateral leaning to decrease level of assistance required for LB dressing completion. During toilet and LB dressing pt requires assistance for pulling pants over hips, therefore today pt was instructed on lateral leaning technique at EOB focusing on pulling pants on and off over hips. With extra time and multiple trials, pt completed with CGA. Pt was provided education on use of lateral leaning for clothing mgt while toileting as well as for LB dressing. Pt completed trunk flexion exercises in bed with Mod A for completing supine to sit with flattened bed. Pt continues to benefit from improving dynamic sitting balance to decrease burden of care for LB self care. Due to w/c not being able to fit in house, pt was provided education on ADL completion at EOB with setup. Will trial ADL at EOB at later session. Pt completed grooming and oral care while standing with unilateral support and Min-Mod A for standing balance at sink. Pt still requires manual cues to extend L knee and to find new base of support. Functional squat pivot transfers completed with Min A. Pt left at end of session in w/c with nursing heading to xray before lunch. No pain reported during session.   Therapy Documentation Precautions:  Precautions Precautions: Fall Restrictions Weight Bearing Restrictions: Yes LLE Weight Bearing: Non weight bearing General:   Vital Signs: Therapy Vitals Temp: 97.8 F (36.6 C) Temp Source: Oral Pulse Rate: 84 Resp: 16 BP: (!) 128/43 mmHg Patient Position (if appropriate): Sitting Oxygen Therapy SpO2: 100 % O2 Device: Not Delivered Pain: Pain Assessment Pain  Score: Asleep ADL: ADL Eating: Modified independent Where Assessed-Eating: Wheelchair Grooming: Setup Where Assessed-Grooming: Wheelchair Upper Body Bathing: Minimal assistance Where Assessed-Upper Body Bathing: Wheelchair Lower Body Bathing: Moderate assistance Where Assessed-Lower Body Bathing: Wheelchair Upper Body Dressing: Moderate assistance Where Assessed-Upper Body Dressing: Wheelchair Lower Body Dressing: Other (Comment) (2 helpers) Where Assessed-Lower Body Dressing: Wheelchair Toileting: Not assessed Toilet Transfer: Not assessed Toilet Transfer Method: Unable to assess Tub/Shower Transfer: Not assessed    See Function Navigator for Current Functional Status.   Therapy/Group: Individual Therapy  Jobeth Pangilinan A Antania Hoefling 02/18/2016, 3:58 PM

## 2016-02-18 NOTE — Progress Notes (Signed)
Social Work Patient ID: Collin Pearson, male   DOB: [birth year], 82 y.o.   MRN: [id number] Spoke with Linda-wife via telephone to discuss team conference goals-min assist level and discharge 7/12. She is currently looking for someone to stay with him while she is working. She has also been in and observed in therapies last Sat and yesterday last PT session. She can see his progress and knows what he needs assistance with. Have not seen Crystal-daughter here in the last two days. Crystal to stay with for the first week abut then her job starts with the school system. Discussed follow up has had [facility] before and would pref them again. Has equipment from previous admits. Will continue to work on discharge plans.

## 2016-02-18 NOTE — Progress Notes (Signed)
Occupational Therapy Note  Patient Details  Name: TAVYN FLUITT MRN: [id number] Date of Birth: [birth year]  Today's Date: 02/18/2016 OT Individual Time: 0930-1000 OT Individual Time Calculation (min): 30 min   Pt denied pain Individual Therapy  Pt resting in w/c upon arrival.  Pt already dressed by NT.  Pt propelled to therapy gym and engaged in BUE therex on Scifit - workload 2 for 7 min in both directions with rest break between.  Pt propelled back to room and remained in w/c with all needs within reach.  Focus on BUE therex to assist with functional transfers and standing balance.    Leotis Shames [facility] 02/18/2016, 12:26 PM

## 2016-02-18 NOTE — Progress Notes (Signed)
Occupational Therapy Session Note  Patient Details  Name: Collin Pearson MRN: [id number] Date of Birth: 1926-09-03  Today's Date: 02/18/2016 OT Individual Time: 1331-1400 OT Individual Time Calculation (min): 29 min    Skilled Therapeutic Interventions/Progress Updates:    Pt worked on sit to stand and standing balance using the RW during session.  Min assist for sit to stand from the bedside chair.  He was able to maintain standing with min assist using the RW for support.  Worked on having pt release with each UE, one at a time to pick up his cup and drink from it.  Still only needing min assist for balance while reaching up with each UE.  Performed short distance mobility as well out into the hallway.  After approximately 10 ft pt began to fatigue and exhibited right knee flexion, requiring max assist to stay standing long enough for wheelchair to be pulled up.  Pt transferred back to bedside recliner with min assist to visit with friends.  Safety belt in place and call button in reach.    Therapy Documentation Precautions:  Precautions Precautions: Fall Restrictions Weight Bearing Restrictions: Yes LLE Weight Bearing: Non weight bearing  Pain: Pain Assessment Pain Assessment: 0-10 Pain Score: Asleep Pain Type: Acute pain Pain Location: Leg Pain Orientation: Left Pain Intervention(s): RN made aware;Medication (See eMAR);Repositioned ADL: See Function Navigator for Current Functional Status.   Therapy/Group: Individual Therapy  Tanav Orsak OTR/L 02/18/2016, 4:07 PM

## 2016-02-18 NOTE — Progress Notes (Signed)
[facility] PHYSICAL MEDICINE & REHABILITATION     PROGRESS NOTE  Subjective/Complaints:  Pt laying in bed this AM.  Per nursing, he had asymptomatic hypoglycemia this AM.  He notes his tooth is loose.   ROS:  Denies CP, SOB, N/V/D.  Objective: Vital Signs: Blood pressure 128/60, pulse 74, temperature 98.1 F (36.7 C), temperature source Oral, resp. rate 16, weight 58.5 kg (128 lb 15.5 oz), SpO2 98 %. No results found.  Recent Labs  02/15/16 0842 02/18/16 0549  WBC 11.7* 9.3  HGB 10.3* 9.3*  HCT 33.4* 30.0*  PLT 340 274    Recent Labs  02/17/16 0541 02/18/16 0549  NA 136 138  K 4.4 4.0  CL 104 111  GLUCOSE 86 62*  BUN 96* 82*  CREATININE 2.31* 2.05*  CALCIUM 9.0 8.7*   CBG (last 3)   Recent Labs  02/16/16 2045 02/17/16 0634 02/17/16 1149  GLUCAP 191* 88 176*    Wt Readings from Last 3 Encounters:  02/18/16 58.5 kg (128 lb 15.5 oz)  02/10/16 58.015 kg (127 lb 14.4 oz)  02/05/16 63.05 kg (139 lb)    Physical Exam:  BP 128/60 mmHg  Pulse 74  Temp(Src) 98.1 F (36.7 C) (Oral)  Resp 16  Wt 58.5 kg (128 lb 15.5 oz)  SpO2 98% Constitutional:  He appears well-developed. Frail.   HENT: Normocephalic and atraumatic. Poor dentition Eyes: Conjunctivae and EOM are normal.   Cardiovascular: Normal rate and regular rhythm.Murmur heard. Respiratory: Breath sounds normal. No stridor. He has no wheezes.  GI: Soft. Bowel sounds are normal. He exhibits no distension. There is no tenderness.  Musculoskeletal: He exhibits no tenderness. He exhibits no edema.  Neurological: He is alert and oriented.  Motor: B/l UE, RLE 5/5 proximal to distal LLE: Hip flexion 5/5  Skin: Skin is warm and dry.  Surgical incision c/d/i, mild erythema lateral edge.   Some ischemic changes to right lower extremity  Coccyx with stage I Psychiatric: He has a normal mood and affect. His behavior is normal.    Assessment/Plan: 1. Functional deficits secondary to left AKA which  require 3+ hours per day of interdisciplinary therapy in a comprehensive inpatient rehab setting. Physiatrist is providing close team supervision and 24 hour management of active medical problems listed below. Physiatrist and rehab team continue to assess barriers to discharge/monitor patient progress toward functional and medical goals.  Function:  Bathing Bathing position   Position: Wheelchair/chair at sink  Bathing parts Body parts bathed by patient: Right arm, Left arm, Chest, Abdomen, Front perineal area, Right upper leg, Right lower leg, Left upper leg Body parts bathed by helper: Buttocks, Back  Bathing assist Assist Level: Touching or steadying assistance(Pt > 75%)      Upper Body Dressing/Undressing Upper body dressing   What is the patient wearing?:  (jacket)         Button up shirt - Perfomed by patient: Thread/unthread right sleeve, Thread/unthread left sleeve, Pull shirt around back, Button/unbutton shirt Button up shirt - Perfomed by helper: Thread/unthread right sleeve    Upper body assist Assist Level: Supervision or verbal cues      Lower Body Dressing/Undressing Lower body dressing Lower body dressing/undressing activity did not occur: N/A What is the patient wearing?: Underwear, Pants, Non-skid slipper socks Underwear - Performed by patient: Thread/unthread right underwear leg, Thread/unthread left underwear leg Underwear - Performed by helper: Pull underwear up/down Pants- Performed by patient: Thread/unthread left pants leg, Fasten/unfasten pants, Thread/unthread right pants leg Pants-  Performed by helper: Pull pants up/down   Non-skid slipper socks- Performed by helper: Don/doff right sock   Socks - Performed by helper: Don/doff right sock              Lower body assist Assist for lower body dressing: Touching or steadying assistance (Pt > 75%)      Toileting Toileting   Toileting steps completed by patient: Performs perineal hygiene Toileting  steps completed by helper: Adjust clothing prior to toileting, Adjust clothing after toileting Toileting Assistive Devices: Grab bar or rail  Toileting assist Assist level: Touching or steadying assistance (Pt.75%)   Transfers Chair/bed transfer   Chair/bed transfer method: Stand pivot Chair/bed transfer assist level: Touching or steadying assistance (Pt > 75%) Chair/bed transfer assistive device: Walker, Air cabin crew     Max distance: 15 Assist level: Touching or steadying assistance (Pt > 75%)   Wheelchair   Type: Manual Max wheelchair distance: 139ft Assist Level: Supervision or verbal cues  Cognition Comprehension Comprehension assist level: Follows complex conversation/direction with extra time/assistive device  Expression Expression assist level: Expresses complex ideas: With extra time/assistive device  Social Interaction Social Interaction assist level: Interacts appropriately with others with medication or extra time (anti-anxiety, antidepressant).  Problem Solving Problem solving assist level: Solves basic 90% of the time/requires cueing < 10% of the time  Memory Memory assist level: Recognizes or recalls 90% of the time/requires cueing < 10% of the time    Medical Problem List and Plan: 1. Abnormality of gait secondary to left AKA.  Cont CIR therapies 2. DVT Prophylaxis/Anticoagulation: Pharmaceutical: Lovenox 3. Pain Management: Continue oxycodone prn and robaxin tid. Added low dose Neurontin to help with neuropathy.  4. Mood: LCSW to follow for evaluation and support.  5. Neuropsych: This patient is capable of making decisions on his own behalf. 6. Skin/Wound Care:   -continue BID dressing with telfa,4x4,kerlix, ACE to keep wound dry.  Prevalon boot for right heel pain.   Pressure relief to sacrum 7. Fluids/Electrolytes/Nutrition: Monitor I/O. Added supplements as intake has been poor.  8. T2DM poor; controlled: Hgb A1c- 8.0. Monitor  BS ac/hs.   Continue glucotrol bid   Levemir decreased to 15U qhs on 6/29, decreased at to 10mg  on qhs on 7/3, decreased to 5U on 7/6.  Started Levamir 5U daily on 7/3, d/ced on 7/6  Novolog 2U TID started on 7/6   Will cont to monitor 9. CAD: Monitor for symptoms with activity. On Imdur, ASA and Lipitor.  10. Leucocytosis:   Afebrile  WBCs 9.3 on 7/6, stable  Monitor for signs of infection.   Encourage IS.  11. Acute on chronic renal failure:   Cr. 2.31 on 7/5   Encourage fluids, IVF ordered qhs x3  Improving 12. Acute on chronic anemia: Continue iron supplement.   Hb 9.3 on 7/6, likely dilutional 13. GERD: Symptoms controlled on protonix.  14. HTN: Monitor BP.   Continue Norvasc  Cont Furosemide  Due to AM elevations, Hydralazine increased to 75mg  BID on 6/30 increased to 75mg  TID on 7/3.  15. Poor dentition  Pt with loose tooth PTA with plan to watch.  Now even looser, will follow up with dental regarding extraction due to possible risk of aspiration   LOS (Days) 8 A FACE TO FACE EVALUATION WAS PERFORMED  Makel Mcmann Lorie Phenix 02/18/2016 7:32 AM

## 2016-02-18 NOTE — Progress Notes (Signed)
Hypoglycemic Event  CBG: 64  Treatment: 4oz orange juice  Symptoms: None  Follow-up CBG: Time:0700 CBG Result: 99  Possible Reasons for Event: unknown  Comments/MD notified: Dr. Posey Pronto notified @ [phone], Pt is asymptomatic. Will continue to monitor     Jeannett Senior, William Hamburger

## 2016-02-18 NOTE — Progress Notes (Signed)
Physical Therapy Session Note  Patient Details  Name: Collin Pearson MRN: [id number] Date of Birth: 08-26-1926  Today's Date: 02/18/2016 PT Individual Time: 1032-1102 AND 1545-1640 PT Individual Time Calculation (min): 30 min AND 55 min  Short Term Goals: Week 1:  PT Short Term Goal 1 (Week 1): Pt will roll R/L with Min A for skin protection in bed PT Short Term Goal 2 (Week 1): Pt will move supine<>sit with Min A PT Short Term Goal 3 (Week 1): Pt will perform basic bed<>transfer with consistent Mod A PT Short Term Goal 4 (Week 1): Pt will ambulate 35' with Min A and RW PT Short Term Goal 5 (Week 1): Pt will initiate stair training  Skilled Therapeutic Interventions/Progress Updates:    Received sitting in WC.  PT noted L residual limb without compression bandaging, and wrapped L residual limb. Sit<>stand to don and doff pants with max A for clothing management and min A for sit<>stand with RW. Patient able to fasten pants in sitting position without Assist from PT.   WC mobility for 135ft with supervision A from PT, min cues provided by PT for obstacle navigation and for improved straight path trajectory.   Gait training for 25 ft with swing to technique and min A from PT. PT only provided min verbal cues for AD management and proper step length on this day.  Throughout treatment patient performed Stand pivot transfer training  with min A x 3 from PT and mod cues for AD management and improved LE positioning.  Patient returned to room and left sitting in WC.   Session 2 Patient received sitting in chair, asleep, around with slight effort from PT.   Stand pivot transfer with RW completed x 4 throughout therapy with min A and mod cues for AD management and proper step length and LE positioning. Patient was able to demonstrate improved problem solving with AD management when stuck on edge of seat compared to previous sessions.    Supine<>sit with supervision A from PT and min cues for  improved technique and use of UE to increase ease of transfer.  Patient also performed Roll to and from stomach with supervision A from PT.   Supine therex for AKA; hip abduction, glute set, bridge with push through bolster on LLE. All supine therex performed x 10 LLE Prone hip extension x 10 BLE PT provided mod multimodal cues for improved technique and improved ROM, decreased compensation from trunk, and improved activation of appropriate muscle groups.   Toilet transfer with RW using stand pivot technique  And min-mod A from PT. PT required to assist patient with clothing management prior to sit.   Patient left sitting in toilet, NT aware of location.     Therapy Documentation Precautions:  Precautions Precautions: Fall Restrictions Weight Bearing Restrictions: Yes (L AKA) LLE Weight Bearing: Non weight bearing General:   Vital Signs:   Pain: Pain Assessment Pain Score: 0-No pain Faces Pain Scale: No hurt   See Function Navigator for Current Functional Status.   Therapy/Group: Individual Therapy  Lorie Phenix 02/18/2016, 11:56 AM

## 2016-02-19 ENCOUNTER — Encounter (HOSPITAL_COMMUNITY): Payer: Self-pay | Admitting: Dentistry

## 2016-02-19 ENCOUNTER — Inpatient Hospital Stay (HOSPITAL_COMMUNITY): Payer: BLUE CROSS/BLUE SHIELD | Admitting: Occupational Therapy

## 2016-02-19 ENCOUNTER — Inpatient Hospital Stay (HOSPITAL_COMMUNITY): Payer: BLUE CROSS/BLUE SHIELD | Admitting: Physical Therapy

## 2016-02-19 DIAGNOSIS — K036 Deposits [accretions] on teeth: Secondary | ICD-10-CM

## 2016-02-19 DIAGNOSIS — K0889 Other specified disorders of teeth and supporting structures: Secondary | ICD-10-CM

## 2016-02-19 DIAGNOSIS — K0530 Chronic periodontitis, unspecified: Secondary | ICD-10-CM

## 2016-02-19 DIAGNOSIS — K083 Retained dental root: Secondary | ICD-10-CM

## 2016-02-19 LAB — BASIC METABOLIC PANEL
ANION GAP: 6 (ref 5–15)
BUN: 66 mg/dL — ABNORMAL HIGH (ref 6–20)
CALCIUM: 8.5 mg/dL — AB (ref 8.9–10.3)
CO2: 22 mmol/L (ref 22–32)
Chloride: 112 mmol/L — ABNORMAL HIGH (ref 101–111)
Creatinine, Ser: 1.81 mg/dL — ABNORMAL HIGH (ref 0.61–1.24)
GFR, EST AFRICAN AMERICAN: 37 mL/min — AB (ref >60)
GFR, EST NON AFRICAN AMERICAN: 32 mL/min — AB (ref >60)
GLUCOSE: 70 mg/dL (ref 65–99)
POTASSIUM: 4 mmol/L (ref 3.5–5.1)
SODIUM: 140 mmol/L (ref 135–145)

## 2016-02-19 LAB — GLUCOSE, CAPILLARY
GLUCOSE-CAPILLARY: 178 mg/dL — AB (ref 65–99)
GLUCOSE-CAPILLARY: 103 mg/dL — AB (ref 65–99)
GLUCOSE-CAPILLARY: 80 mg/dL (ref 65–99)
GLUCOSE-CAPILLARY: 171 mg/dL — AB (ref 65–99)
Glucose-Capillary: 148 mg/dL — ABNORMAL HIGH (ref 65–99)
Glucose-Capillary: 132 mg/dL — ABNORMAL HIGH (ref 65–99)

## 2016-02-19 MED ORDER — HYDRALAZINE HCL 50 MG PO TABS
100.0000 mg | ORAL_TABLET | Freq: Three times a day (TID) | ORAL | Status: DC
  Administered 2016-02-19 – 2016-02-25 (×18): 100 mg via ORAL
  Filled 2016-02-19 (×18): qty 2

## 2016-02-19 NOTE — Consult Note (Signed)
DENTAL CONSULTATION  Date of Consultation:  02/19/2016 Patient Name:   Collin Pearson Date of Birth:   05/21/1927 Medical Record Number: [id number]  VITALS: BP 152/49 mmHg  Pulse 81  Temp(Src) 98 F (36.7 C) (Oral)  Resp 16  Wt 126 lb 15.8 oz (57.6 kg)  SpO2 100%  CHIEF COMPLAINT: Patient referred by Dr. Posey Pronto for dental consultation.   HPI: Collin Pearson is an 82 year old male recently below-knee amputation by Dr. Donnetta Hutching.  Patient currently undergoing rehabilitation services as an inpatient. Dental consultation requested by Dr. Posey Pronto for evaluation of a "loose" lower right tooth..  Patient currently indicates that he removed the coronal portion of the lower right canine last evening. The remaining root is still present. Patient denies any pain associated with this tooth. Patient has not seen a dentist for a long time. Patient denies having dental phobia.  PROBLEM LIST: Patient Active Problem List   Diagnosis Date Noted  . Loosening of tooth   . Poor dentition   . CKD (chronic kidney disease)   . Unilateral AKA ([place]) 02/10/2016  . Gastroesophageal reflux disease with esophagitis   . Anemia of chronic disease   . AKI (acute kidney injury) ([place])   . Status post below knee amputation of left lower extremity ([place])   . Coronary artery disease involving coronary bypass graft of native heart without angina pectoris   . S/P AVR   . Benign essential HTN   . Diabetes mellitus type 2 in nonobese (HCC)   . S/p bilateral carotid endarterectomy   . Chronic kidney disease (CKD), stage IV (severe) ([place])   . Acute blood loss anemia   . Post-operative pain   . Leukocytosis   . PAD (peripheral artery disease) ([place]) 02/08/2016  . Acute on chronic diastolic CHF (congestive heart failure), NYHA class 1 ([place]) 02/09/2015  . Physical deconditioning   . Acute on chronic renal failure ([place]) 02/07/2015  . Atrial fibrillation ([place]) 02/07/2015  . Bradycardia 02/04/2015  . Encounter for  nasogastric tube placement   . Endotracheally intubated   . Hypoxia   . Pulmonary edema   . Peripheral arterial disease ([place]) 08/22/2014  . Carotid artery stenosis,Last dopplers 09/20/12 05/29/2013  . Ventral hernia 10/04/2012  . PAF (paroxysmal atrial fibrillation), s/p cabg 06/18/2012  . S/P CABG x 1 with tissue AVR 06/13/12 06/14/2012  . Rib fractures, several, left side  06/11/2012  . CKD (chronic kidney disease) stage 4, GFR 15-29 ml/min (HCC) 06/11/2012  . HTN (hypertension) 06/06/2012  . Dyslipidemia 06/06/2012  . Diabetes mellitus ([place]) 06/06/2012  . NSTEMI (non-ST elevated myocardial infarction) ([place]) 06/06/2012  . Chronic anemia 06/06/2012  . Acute pulmonary edema ([place]) 06/06/2012  . PVD, with hx of bilateral CEA at [facility], and previous bilateral carotid stenting, with high grade ISR of Rt. carotid 05/18/12 05/18/2012  . Angina effort ([place]) 05/18/2012  . Clotting disorder ([place])   . CAD, patent stent to LAD and mid RCA with occluded OM2 branch     . AS (aortic stenosis), critical stenosis     PMH: Past Medical History  Diagnosis Date  . Clotting disorder ([place])   . Coronary artery disease   . AS (aortic stenosis)     status post aortic valve replacement with an [employer] model 42 T. fracture bioprosthesis  . Anginal pain ([place])   . Hypertension   . Diabetes mellitus   . Peripheral vascular disease ([place])   . GERD (gastroesophageal reflux disease)   . CKD (  chronic kidney disease) stage 4, GFR 15-29 ml/min (HCC)   . PVD (peripheral vascular disease), with hsx of bilateral carotid endarterectomies at [facility], and bilateral carotid stenting 05/18/2012  . Heart murmur   . PAF (paroxysmal atrial fibrillation), s/p cabg 06/18/2012  . Critical lower limb ischemia   . Arthritis   . Anxiety     PSH: Past Surgical History  Procedure Laterality Date  . Endarterectomy    . Prostate surgery    . Cardiac stents    . Cardiac catheterization    . Coronary artery  bypass graft  06/13/2012    Procedure: CORONARY ARTERY BYPASS GRAFTING (CABG);  Surgeon: Grace Isaac, MD;  Location: [facility];  Service: Open Heart Surgery;  Laterality: N/A;  must start at 42 - appointment at 0800  . Aortic valve replacement  06/13/2012    Procedure: AORTIC VALVE REPLACEMENT (AVR);  Surgeon: Grace Isaac, MD;  Location: [facility];  Service: Open Heart Surgery;  Laterality: N/A;  . Cardiac stress test  06/03/2010    Mild anterolateral ischemis  . Left and right heart catheterization with coronary angiogram N/A 05/18/2012    Procedure: LEFT AND RIGHT HEART CATHETERIZATION WITH CORONARY ANGIOGRAM;  Surgeon: Lorretta Harp, MD;  Location: [facility] CATH LAB;  Service: Cardiovascular;  Laterality: N/A;  . Carotid angiogram N/A 05/18/2012    Procedure: CAROTID ANGIOGRAM;  Surgeon: Lorretta Harp, MD;  Location: [facility] CATH LAB;  Service: Cardiovascular;  Laterality: N/A;  . Cardiac valve replacement    . Above knee leg amputation Left 02/08/2016  . Amputation Left 02/08/2016    Procedure: AMPUTATION ABOVE KNEE;  Surgeon: Rosetta Posner, MD;  [facility];  Service: Vascular;  Laterality: Left;    ALLERGIES: No Known Allergies  MEDICATIONS: Current Facility-Administered Medications  Medication Dose Route Frequency Provider Last Rate Last Dose  . 0.9 %  sodium chloride infusion   Intravenous QHS Ankit Lorie Phenix, MD 150 mL/hr at 02/18/16 2054    . acetaminophen (TYLENOL) tablet 325-650 mg  325-650 mg Oral Q4H PRN Bary Leriche, PA-C      . alum & mag hydroxide-simeth (MAALOX/MYLANTA) 200-200-20 MG/5ML suspension 15-30 mL  15-30 mL Oral Q4H PRN Bary Leriche, PA-C      . amLODipine (NORVASC) tablet 10 mg  10 mg Oral Daily Bary Leriche, PA-C   10 mg at 02/19/16 [id number]  . aspirin EC tablet 81 mg  81 mg Oral BID Bary Leriche, PA-C   81 mg at 02/19/16 0850  . atorvastatin (LIPITOR) tablet 40 mg  40 mg Oral QHS Ivan Anchors Love, PA-C   40 mg at 02/18/16 2055  . bisacodyl (DULCOLAX)  suppository 10 mg  10 mg Rectal Daily PRN Bary Leriche, PA-C   10 mg at 02/12/16 [id number]  . diphenhydrAMINE (BENADRYL) 12.5 MG/5ML elixir 12.5-25 mg  12.5-25 mg Oral Q6H PRN Ivan Anchors Love, PA-C      . enoxaparin (LOVENOX) injection 30 mg  30 mg Subcutaneous Q24H Pamela S Love, PA-C   30 mg at 02/19/16 0850  . feeding supplement (GLUCERNA SHAKE) (GLUCERNA SHAKE) liquid 237 mL  237 mL Oral BID BM Ankit Lorie Phenix, MD   237 mL at 02/19/16 0851  . feeding supplement (PRO-STAT SUGAR FREE 64) liquid 30 mL  30 mL Oral Q1500 Ankit Lorie Phenix, MD   30 mL at 02/18/16 1713  . ferrous sulfate 300 (60 Fe) MG/5ML syrup 300 mg  300 mg Oral BID WC  Ankit Lorie Phenix, MD   300 mg at 02/19/16 0850  . furosemide (LASIX) tablet 80 mg  80 mg Oral Daily Bary Leriche, PA-C   80 mg at 02/19/16 [id number]  . gabapentin (NEURONTIN) capsule 100 mg  100 mg Oral QHS Ivan Anchors Love, PA-C   100 mg at 02/18/16 2055  . glipiZIDE (GLUCOTROL) tablet 10 mg  10 mg Oral BID AC Ivan Anchors Love, PA-C   10 mg at 02/19/16 [id number]  . guaiFENesin-dextromethorphan (ROBITUSSIN DM) 100-10 MG/5ML syrup 15 mL  15 mL Oral Q4H PRN Ivan Anchors Love, PA-C      . hydrALAZINE (APRESOLINE) tablet 100 mg  100 mg Oral Q8H Ankit Lorie Phenix, MD      . insulin aspart (novoLOG) injection 0-9 Units  0-9 Units Subcutaneous TID WC Bary Leriche, PA-C   1 Units at 02/19/16 1235  . insulin aspart (novoLOG) injection 2 Units  2 Units Subcutaneous TID WC Ankit Lorie Phenix, MD   2 Units at 02/19/16 1234  . insulin detemir (LEVEMIR) injection 5 Units  5 Units Subcutaneous QHS Ankit Lorie Phenix, MD   5 Units at 02/18/16 2054  . isosorbide mononitrate (IMDUR) 24 hr tablet 60 mg  60 mg Oral Daily Bary Leriche, PA-C   60 mg at 02/19/16 0850  . methocarbamol (ROBAXIN) tablet 500 mg  500 mg Oral Q6H PRN Bary Leriche, PA-C   500 mg at 02/14/16 [id number]  . multivitamin with minerals tablet 1 tablet  1 tablet Oral Daily Bary Leriche, PA-C   1 tablet at 02/19/16 0851  . oxyCODONE (Oxy IR/ROXICODONE)  immediate release tablet 5-10 mg  5-10 mg Oral Q4H PRN Bary Leriche, PA-C   10 mg at 02/19/16 [id number]  . pantoprazole (PROTONIX) EC tablet 40 mg  40 mg Oral Daily Bary Leriche, PA-C   40 mg at 02/19/16 0850  . prochlorperazine (COMPAZINE) tablet 5-10 mg  5-10 mg Oral Q6H PRN Bary Leriche, PA-C       Or  . prochlorperazine (COMPAZINE) injection 5-10 mg  5-10 mg Intramuscular Q6H PRN Bary Leriche, PA-C       Or  . prochlorperazine (COMPAZINE) suppository 12.5 mg  12.5 mg Rectal Q6H PRN Bary Leriche, PA-C      . senna-docusate (Senokot-S) tablet 1 tablet  1 tablet Oral QHS Bary Leriche, PA-C   1 tablet at 02/18/16 2055  . sodium phosphate (FLEET) 7-19 GM/118ML enema 1 enema  1 enema Rectal Once PRN Bary Leriche, PA-C      . tamsulosin (FLOMAX) capsule 0.4 mg  0.4 mg Oral QPC supper Bary Leriche, PA-C   0.4 mg at 02/18/16 1713  . traZODone (DESYREL) tablet 25-50 mg  25-50 mg Oral QHS PRN Bary Leriche, PA-C   50 mg at 02/13/16 2147    LABS: Lab Results  Component Value Date   WBC 9.3 02/18/2016   HGB 9.3* 02/18/2016   HCT 30.0* 02/18/2016   MCV 96.5 02/18/2016   PLT 274 02/18/2016      Component Value Date/Time   NA 140 02/19/2016 0557   K 4.0 02/19/2016 0557   CL 112* 02/19/2016 0557   CO2 22 02/19/2016 0557   GLUCOSE 70 02/19/2016 0557   BUN 66* 02/19/2016 0557   CREATININE 1.81* 02/19/2016 0557   CREATININE 2.51* 06/02/2015 1537   CALCIUM 8.5* 02/19/2016 0557   GFRNONAA 32* 02/19/2016 0557   GFRAA 37* 02/19/2016 0557  Lab Results  Component Value Date   INR 1.22 02/06/2015   INR 1.55* 06/13/2012   INR 1.06 06/12/2012   No results found for: PTT  SOCIAL HISTORY: Social History   Social History  . Marital Status: Married    Spouse Name: N/A  . Number of Children: N/A  . Years of Education: N/A   Occupational History  . Not on file.   Social History Main Topics  . Smoking status: Never Smoker   . Smokeless tobacco: Never Used  . Alcohol Use: No  . Drug  Use: No  . Sexual Activity: Not Currently   Other Topics Concern  . Not on file   Social History Narrative    FAMILY HISTORY: Family History  Problem Relation Age of Onset  . Heart disease Mother 13  . Heart disease Father 63  . CAD Brother   . Heart disease Maternal Uncle   . Heart disease Paternal Uncle   . Heart disease Maternal Grandfather 68    REVIEW OF SYSTEMS: Reviewed with patient as per history of present illness.  DENTAL HISTORY: CHIEF COMPLAINT: Patient referred by Dr. Posey Pronto for dental consultation.   HPI: Collin Pearson is an 82 year old male recently below-knee amputation by Dr. Donnetta Hutching.  Patient currently undergoing rehabilitation services as an inpatient. Dental consultation requested by Dr. Posey Pronto for evaluation of a "loose" lower right tooth..  Patient currently indicates that he removed the coronal portion of the lower right canine last evening. The remaining root is still present. Patient denies any pain associated with this tooth. Patient has not seen a dentist for a long time. Patient denies having dental phobia.  DENTAL EXAMINATION: GENERAL: Patient is a well-developed, slightly built male in no acute distress. Patient is currently sitting in a wheelchair undergoing rehabilitation services. HEAD AND NECK: There is no palpable submandibular lymphadenopathy. The patient denies acute TMJ symptoms. INTRAORAL EXAM: There is no evidence of abscess formation. Patient has bilateral mandibular lingual tori. DENTITION: Patient with multiple missing teeth. Tooth #27 has a crown which was removed by the patient yesterday. Root segment remains.  PERIODONTAL: Patient has chronic periodontitis with plaque and calculus accumulations, gingival recession, and incipient tooth mobility. ENDODONTIC: The patient currently denies acute pulpitis symptoms. CROWN AND BRIDGE: Multiple crown or bridge restorations are noted. Patient recently removed a crown associated with the  coronal portion of tooth #27. PROSTHODONTIC: No partial dentures noted OCCLUSION: Poor occlusal scheme noted.  RADIOGRAPHIC INTERPRETATION: An orthopantogram was taken by the department of radiology on 7/6/ 2017. There is suboptimal due to some movement by the patient. There are multiple missing teeth. There is incipient to moderate bone loss. Multiple crown and bridge restorations are noted. No obvious periapical radiolucency is noted.  ASSESSMENTS: 1. Status post below-knee amputation by Dr. Donnetta Hutching 2. Currently undergoing rehabilitation services as an inpatient due to deconditioning 3. History of removal of the coronal portion of tooth #27  4. Retained root segment over 27 5. Chronic periodontitis bone loss 6. Accretions 7. Multiple missing teeth 8. Malocclusion 9. Status post heart valve replacement with need for antibiotic premedication prior to invasive dental procedures 10. Risk For bleeding with invasive dental procedures   PLAN/RECOMMENDATIONS: 1. I discussed the risks, benefits, and complications of various treatment options with the patient in relationship to his medical and dental conditions. We discussed various treatment options to include no treatment, extraction of retained root segment #27, periodontal therapy.  The patient currently wishes to proceed with extraction of tooth #27  in the operating room with monitored anesthesia care as indicated. Will consider dental cleaning as time and patient condition allows. I have scheduled this for Tuesday  at 7:30 am in OR #15.     2. Discussion of findings with medical team and coordination of future medical and dental care as needed.    Lenn Cal, DDS

## 2016-02-19 NOTE — Progress Notes (Signed)
Occupational Therapy Session Note  Patient Details  Name: Collin Pearson MRN: [id number] Date of Birth: April 28, 1927  Today's Date: 02/19/2016 OT Individual Time: 1300-1347 OT Individual Time Calculation (min): 47 min    Skilled Therapeutic Interventions/Progress Updates:    Pt worked on donning sock and shoe on the right foot.  Had pt prop foot on the bed as no foot stool available to try this session.  He was able to donn sock and velcro shoe with setup.  Had him roll down to the ADL apartment where he engaged in stand pivot transfers with the RW to the tub bench with min assist level.  Pt reports that he has a grab bar and a tub bench that his wife just purchased.  Also practiced wheelchair to bed transfers as well.  Pt attempted squat pivot without assistive device for transfer to the bed from wheelchair.  Mod assist needed for this but he was able to transfer back to the wheelchair with min assist using the RW.  Therapist pushed pt back to the room at end of session.  Safety belt in place and call button in reach.    Therapy Documentation Precautions:  Precautions Precautions: Fall Restrictions Weight Bearing Restrictions: Yes LLE Weight Bearing: Non weight bearing  Pain: Pain Assessment Pain Assessment: No/denies pain ADL: See Function Navigator for Current Functional Status.   Therapy/Group: Individual Therapy  Karyssa Amaral OTR/L 02/19/2016, 3:46 PM

## 2016-02-19 NOTE — Plan of Care (Signed)
Problem: RH Ambulation Goal: LTG Patient will ambulate in controlled environment (PT) LTG: Patient will ambulate in a controlled environment, # of feet with assistance (PT).  Goal reduced 2/2 slow progress Goal: LTG Patient will ambulate in home environment (PT) LTG: Patient will ambulate in home environment, # of feet with assistance (PT).  Reduced due to slow progress

## 2016-02-19 NOTE — Progress Notes (Signed)
Occupational Therapy Session Note  Patient Details  Name: Collin Pearson MRN: [id number] Date of Birth: [birth year]  Today's Date: 02/19/2016 OT Individual Time: [id number] OT Individual Time Calculation (min): 34 min    Skilled Therapeutic Interventions/Progress Updates:    Upon skilled OT arrival this morning pt was asleep in bed. Pt reported having to void after awoken, and therefore completed transfer from w/c to toilet with Min A and use of grab bars. Due to pt sleepiness, pt required Max A for positioning in order to urinate while maintaining bilateral support with grab bars. Pt returned to w/c in order to dress at sink. Pt completed UB dressing with Min A for bringing shirt around in back, and Min A for pulling LB garments over hips while standing. Pt complete fastening of pants while sitting. Pt still requires vcs to extend right knee during functional transfers as well as vcs for proper hand placement. Will discuss with pt at later session regarding grab bars in home. Pt does have BSC. Pt left in w/c with QRB and all needs within reach. Pt continues to benefit from tx focusing on engagement in unilateral activities while standing. No pain reported today.   2nd Session 1:1 tx 1100-1201 Pt tx session focused on increasing independence with toileting tasks. Pt completed toilet transfer with RW and swing technique and Min A for balance in bathroom on raised toilet seat. Pt able to complete clothing mgt with Mod A for balance and extra time. Hygiene was completed while seated with instruction on lateral leaning. Pt required overall Min A for pulling LB undergarments over hips post toileting, and transferred back to w/c with RW. Pt requires cues for anterior leaning and straightening R knee. Pt exhibited greater ease of transitioning hand positions during sit to stands/stands to sit with manual cues for anterior weight shift. Pt participated in organizing of grooming/oral care materials, and grooming  task completion while standing at sink with unilateral support and Mod A. Pt required multiple rest breaks during session. Pt reported having no grab bars in home, but having BSC. Still unsure of bathroom access due to doorway width in discharge setting. Written note was left in room for pts wife [name] to obtain home measurements and assist with discharge planning. Pt left in room with QRB and lunch, all needs within reach.   Therapy Documentation Precautions:  Precautions Precautions: Fall Restrictions Weight Bearing Restrictions: Yes LLE Weight Bearing: Non weight bearing    Pain: Pain Assessment Pain Score: 4  ADL: ADL Eating: Modified independent Where Assessed-Eating: Wheelchair Grooming: Setup Where Assessed-Grooming: Wheelchair Upper Body Bathing: Minimal assistance Where Assessed-Upper Body Bathing: Wheelchair Lower Body Bathing: Moderate assistance Where Assessed-Lower Body Bathing: Wheelchair Upper Body Dressing: Moderate assistance Where Assessed-Upper Body Dressing: Wheelchair Lower Body Dressing: Other (Comment) (2 helpers) Where Assessed-Lower Body Dressing: Wheelchair Toileting: Not assessed Toilet Transfer: Not assessed Toilet Transfer Method: Unable to assess Tub/Shower Transfer: Not assessed     See Function Navigator for Current Functional Status.   Therapy/Group: Individual Therapy  Collin Pearson A Norell Brisbin 02/19/2016, 10:45 AM

## 2016-02-19 NOTE — Progress Notes (Signed)
Physical Therapy Weekly Progress Note  Patient Details  Name: Collin Pearson MRN: [id number] Date of Birth: 29-Jul-1927  Beginning of progress report period: February 11, 2016 End of progress report period: February 19, 2016  Today's Date: 02/19/2016 PT Individual Time: 1415-1520 PT Individual Time Calculation (min): 65 min   Patient has met 4 of 5 short term goals.  Decreased UE strength has prevented achievement of ambulation goal.   Patient continues to demonstrate the following deficits: Decreased Strength, endurance, functional transfers, safety with gait training,  and therefore will continue to benefit from skilled PT intervention to enhance overall performance with activity tolerance, balance, postural control and ability to compensate for deficits.  Patient progressing toward long term goals..  Continue plan of care.  PT Short Term Goals Week 1:  PT Short Term Goal 1 (Week 1): Pt will roll R/L with Min A for skin protection in bed PT Short Term Goal 1 - Progress (Week 1): Met PT Short Term Goal 2 (Week 1): Pt will move supine<>sit with Min A PT Short Term Goal 2 - Progress (Week 1): Met PT Short Term Goal 3 (Week 1): Pt will perform basic bed<>transfer with consistent Mod A PT Short Term Goal 3 - Progress (Week 1): Met PT Short Term Goal 4 (Week 1): Pt will ambulate 64' with Min A and RW PT Short Term Goal 4 - Progress (Week 1): Progressing toward goal PT Short Term Goal 5 (Week 1): Pt will initiate stair training PT Short Term Goal 5 - Progress (Week 1): Met Week 2:  PT Short Term Goal 1 (Week 2): LTG =STG due to anticipated D/C date.   Skilled Therapeutic Interventions/Progress Updates:    Patient instructed WC mobility in controlled environment without Assist from PT for 141f.   Stair training in parallel bars. 2 inch step x 1 with min A. 4 inch step x 4 with min A. 6 inch step x 3 with mod A on first 2 and min A on third step. While in parallel bars PT provided mod cues for UE  positioning and increased use of momentum to allow ascent of steps.   Stair training on rehab stairs. Patient unable to perform 6 inch step due height and angle of rails, but attempted 3 times with mod A from PT. 3 inch steps x 4 with min A from PT with min-mod cues for sequencing of movement and proper swing to technique with posterior descent.   Squat pivot transfers x 6 with supervision A -min A. Patient continues to require mod cues for improved UE positioning and increased awareness of head hips relationship    UE bike x 10 minutes for increased UE endurance with min cues for improved RPM, 2 minutes on level 2 and 8 minutes on level 5.  Patient performed 4 minutes with forward rotation and 4 minutes with backward rotation.   Patient returned to room and left sitting in recliner with all need met.    Therapy Documentation Precautions:  Precautions Precautions: Fall Restrictions Weight Bearing Restrictions: Yes LLE Weight Bearing: Non weight bearing General:   Vital Signs: Therapy Vitals Temp: 98.6 F (37 C) Temp Source: Oral Pulse Rate: 82 Resp: 16 BP: (!) 139/55 mmHg Patient Position (if appropriate): Sitting Oxygen Therapy SpO2: 97 % O2 Device: Not Delivered Pain: Pain Assessment Pain Score: 2   See Function Navigator for Current Functional Status.  Therapy/Group: Individual Therapy  ALorie Phenix7/02/2016, 6:29 PM

## 2016-02-19 NOTE — Progress Notes (Signed)
[facility] PHYSICAL MEDICINE & REHABILITATION     PROGRESS NOTE  Subjective/Complaints:  Pt working with PT.  He is very thankful for the care he has received.   ROS:  Denies CP, SOB, N/V/D.  Objective: Vital Signs: Blood pressure 152/49, pulse 81, temperature 98 F (36.7 C), temperature source Oral, resp. rate 16, weight 57.6 kg (126 lb 15.8 oz), SpO2 100 %. Dg Orthopantogram  02/18/2016  CLINICAL DATA:  Right lower loose tooth which is capped. EXAM: ORTHOPANTOGRAM/PANORAMIC COMPARISON:  None in PACs no definite acute abnormality. FINDINGS: Reportedly the capped right lateral incisor is the symptomatic region. Deep to the metallic cap normal radiodense tooth tissue is not appreciated. The remaining adjacent lower teeth appear intact. The visualized upper teeth remaining exhibit multiple caps but no definite abnormality. IMPRESSION: This is a limited study. No normal native tooth tissue is demonstrated inferior to the capped lateral incisor. This may indicate carries or abscess. Electronically Signed   By: David  Martinique M.D.   On: 02/18/2016 12:43    Recent Labs  02/18/16 0549  WBC 9.3  HGB 9.3*  HCT 30.0*  PLT 274    Recent Labs  02/18/16 0549 02/19/16 0557  NA 138 140  K 4.0 4.0  CL 111 112*  GLUCOSE 62* 70  BUN 82* 66*  CREATININE 2.05* 1.81*  CALCIUM 8.7* 8.5*   CBG (last 3)   Recent Labs  02/18/16 1642 02/18/16 2039 02/19/16 0642  GLUCAP 117* 192* 80    Wt Readings from Last 3 Encounters:  02/19/16 57.6 kg (126 lb 15.8 oz)  02/10/16 58.015 kg (127 lb 14.4 oz)  02/05/16 63.05 kg (139 lb)    Physical Exam:  BP 152/49 mmHg  Pulse 81  Temp(Src) 98 F (36.7 C) (Oral)  Resp 16  Wt 57.6 kg (126 lb 15.8 oz)  SpO2 100% Constitutional:  He appears well-developed. Frail.   HENT: Normocephalic and atraumatic. Poor dentition Eyes: Conjunctivae and EOM are normal.   Cardiovascular: Normal rate and regular rhythm.Murmur heard. Respiratory: Breath sounds  normal. No stridor. He has no wheezes.  GI: Soft. Bowel sounds are normal. He exhibits no distension. There is no tenderness.  Musculoskeletal: He exhibits no tenderness. He exhibits no edema.  Neurological: He is alert and oriented.  Motor: B/l UE, RLE 5/5 proximal to distal LLE: Hip flexion 5/5  Skin: Skin is warm and dry.  Surgical incision c/d/i, mild erythema lateral edge.   Some ischemic changes to right lower extremity  Coccyx with stage I Psychiatric: He has a normal mood and affect. His behavior is normal.    Assessment/Plan: 1. Functional deficits secondary to left AKA which require 3+ hours per day of interdisciplinary therapy in a comprehensive inpatient rehab setting. Physiatrist is providing close team supervision and 24 hour management of active medical problems listed below. Physiatrist and rehab team continue to assess barriers to discharge/monitor patient progress toward functional and medical goals.  Function:  Bathing Bathing position   Position: Wheelchair/chair at sink  Bathing parts Body parts bathed by patient: Right arm, Left arm, Chest, Abdomen, Front perineal area, Right upper leg, Right lower leg, Left upper leg Body parts bathed by helper: Buttocks, Back  Bathing assist Assist Level: Touching or steadying assistance(Pt > 75%)      Upper Body Dressing/Undressing Upper body dressing   What is the patient wearing?:  (jacket)         Button up shirt - Perfomed by patient: Thread/unthread right sleeve, Thread/unthread left sleeve,  Pull shirt around back, Button/unbutton shirt Button up shirt - Perfomed by helper: Thread/unthread right sleeve    Upper body assist Assist Level: Supervision or verbal cues      Lower Body Dressing/Undressing Lower body dressing Lower body dressing/undressing activity did not occur: N/A What is the patient wearing?: Underwear, Pants, Non-skid slipper socks Underwear - Performed by patient: Thread/unthread right  underwear leg, Thread/unthread left underwear leg Underwear - Performed by helper: Pull underwear up/down Pants- Performed by patient: Thread/unthread left pants leg, Fasten/unfasten pants, Thread/unthread right pants leg Pants- Performed by helper: Pull pants up/down   Non-skid slipper socks- Performed by helper: Don/doff right sock   Socks - Performed by helper: Don/doff right sock              Lower body assist Assist for lower body dressing: Touching or steadying assistance (Pt > 75%)      Toileting Toileting   Toileting steps completed by patient: Performs perineal hygiene Toileting steps completed by helper: Adjust clothing prior to toileting, Adjust clothing after toileting Toileting Assistive Devices: Grab bar or rail  Toileting assist Assist level: Touching or steadying assistance (Pt.75%)   Transfers Chair/bed transfer   Chair/bed transfer method: Stand pivot Chair/bed transfer assist level: Touching or steadying assistance (Pt > 75%) Chair/bed transfer assistive device: Walker, Air cabin crew     Max distance: 58ft Assist level: Touching or steadying assistance (Pt > 75%)   Wheelchair   Type: Manual Max wheelchair distance: 165ft Assist Level: Supervision or verbal cues  Cognition Comprehension Comprehension assist level: Follows complex conversation/direction with extra time/assistive device  Expression Expression assist level: Expresses complex ideas: With extra time/assistive device  Social Interaction Social Interaction assist level: Interacts appropriately with others with medication or extra time (anti-anxiety, antidepressant).  Problem Solving Problem solving assist level: Solves complex 90% of the time/cues < 10% of the time  Memory Memory assist level: Recognizes or recalls 90% of the time/requires cueing < 10% of the time    Medical Problem List and Plan: 1. Abnormality of gait secondary to left AKA.  Cont CIR therapies 2.  DVT Prophylaxis/Anticoagulation: Pharmaceutical: Lovenox 3. Pain Management: Continue oxycodone prn and robaxin tid. Added low dose Neurontin to help with neuropathy.  4. Mood: LCSW to follow for evaluation and support.  5. Neuropsych: This patient is capable of making decisions on his own behalf. 6. Skin/Wound Care:   -continue BID dressing with telfa,4x4,kerlix, ACE to keep wound dry.  Prevalon boot for right heel pain.   Pressure relief to sacrum 7. Fluids/Electrolytes/Nutrition: Monitor I/O. Added supplements as intake has been poor.  8. T2DM poor; controlled: Hgb A1c- 8.0. Monitor BS ac/hs.   Continue glucotrol bid   Levemir decreased to 15U qhs on 6/29, decreased at to 10mg  on qhs on 7/3, decreased to 5U on 7/6.  Levamir 5U daily on 7/3, d/ced on 7/6  Novolog 2U TID started on 7/6   Will cont to monitor and consider further adjustments tomorrow 9. CAD: Monitor for symptoms with activity. On Imdur, ASA and Lipitor.  10. Leucocytosis:   Afebrile  WBCs 9.3 on 7/6, stable  Monitor for signs of infection.   Encourage IS.  11. Acute on chronic renal failure:   Cr. 1.81 on 7/7  Encourage fluids, IVF ordered qhs x2 more  Improving 12. Acute on chronic anemia: Continue iron supplement.   Hb 9.3 on 7/6, likely dilutional 13. GERD: Symptoms controlled on protonix.  14. HTN: Monitor BP.  Continue Norvasc  Cont Furosemide  Due to AM elevations, Hydralazine increased to 75mg  BID on 6/30 increased to 75mg  TID on 7/3, increased to 100 on 7/7.  15. Poor dentition  Pt with loose tooth PTA with plan to watch.  Now even looser, will follow up with dental regarding extraction due to possible risk of aspiration   LOS (Days) 9 A FACE TO FACE EVALUATION WAS PERFORMED  Kista Robb Lorie Phenix 02/19/2016 10:04 AM

## 2016-02-19 NOTE — Discharge Summary (Signed)
[facility] Discharge Summary   Patient ID:  Collin Pearson MRN: [id number] DOB/AGE: October 29, 1926 82 y.o.  Admit date: 02/08/2016 Discharge date: 02/10/2016 Date of Surgery: 02/08/2016 Surgeon: Surgeon(s): Rosetta Posner, MD  Admission Diagnosis: Peripheral vascular disease with left lower extremity ulcer I70.249  Discharge Diagnoses:  Peripheral vascular disease with left lower extremity ulcer I70.249  Secondary Diagnoses: Past Medical History  Diagnosis Date  . Clotting disorder ([place])   . Coronary artery disease   . AS (aortic stenosis)     status post aortic valve replacement with an [employer] model 57 T. fracture bioprosthesis  . Anginal pain ([place])   . Hypertension   . Diabetes mellitus   . Peripheral vascular disease ([place])   . GERD (gastroesophageal reflux disease)   . CKD (chronic kidney disease) stage 4, GFR 15-29 ml/min (H[facility])   . PVD (peripheral vascular disease), with hsx of bilateral carotid endarterectomies at [facility], and bilateral carotid stenting 05/18/2012  . Heart murmur   . PAF (paroxysmal atrial fibrillation), s/p cabg 06/18/2012  . Critical lower limb ischemia   . Arthritis   . Anxiety     Procedure(s): AMPUTATION ABOVE KNEE  Discharged Condition: good  HPI: Collin Pearson is a 82 y.o. male, who is seen today for urgent evaluation regarding gangrenous changes in his left foot. He is here today with his wife and 2 daughters. He and they report that up until one month ago he was ambulatory. He is very frail and has a multitude of medical problems including critical coronary disease and stage IV kidney disease. The last month he has had a rapidly progressive gangrenous changes of his left foot. He was seen in the [place] wound center and was referred to [name]. Does have a history of prior aortic valve replacement with a tissue valve and is not on anticoagulation. He now has developed gangrene of all the toes of his left foot with  severe erythema and also breakdown of the pretibial tissue above his ankle on the left. Right leg does have significant swelling but no skin breakdown.  Past history is significant for remote history of bilateral carotid endarterectomy at [facility]. Also subsequently had recurrences in bilateral carotid stenting in [facility].   Hospital Course:  Collin Pearson is a 82 y.o. male is S/P Left Procedure(s): AMPUTATION ABOVE KNEE Left BKA site healing well Patient motivated and discharge to CIR POD# 2 Disposition stable   Significant Diagnostic Studies: CBC Lab Results  Component Value Date   WBC 9.3 02/18/2016   HGB 9.3* 02/18/2016   HCT 30.0* 02/18/2016   MCV 96.5 02/18/2016   PLT 274 02/18/2016    BMET    Component Value Date/Time   NA 140 02/19/2016 0557   K 4.0 02/19/2016 0557   CL 112* 02/19/2016 0557   CO2 22 02/19/2016 0557   GLUCOSE 70 02/19/2016 0557   BUN 66* 02/19/2016 0557   CREATININE 1.81* 02/19/2016 0557   CREATININE 2.51* 06/02/2015 1537   CALCIUM 8.5* 02/19/2016 0557   GFRNONAA 32* 02/19/2016 0557   GFRAA 37* 02/19/2016 0557   COAG Lab Results  Component Value Date   INR 1.22 02/06/2015   INR 1.55* 06/13/2012   INR 1.06 06/12/2012     Disposition:  Discharge to :Rehab Discharge Instructions    Activity as tolerated - No restrictions    Complete by:  As directed      Call MD for:  redness, tenderness, or signs  of infection (pain, swelling, bleeding, redness, odor or green/yellow discharge around incision site)    Complete by:  As directed      Call MD for:  severe or increased pain, loss or decreased feeling  in affected limb(s)    Complete by:  As directed      Call MD for:  temperature >100.5    Complete by:  As directed      Discharge instructions    Complete by:  As directed   Stable AKA may shower daily.     Resume previous diet    Complete by:  As directed             Medication List    TAKE these  medications        amLODipine 10 MG tablet  Commonly known as:  NORVASC  TAKE 1 TABLET DAILY     aspirin EC 81 MG tablet  Take 81 mg by mouth 2 (two) times daily.     atorvastatin 40 MG tablet  Commonly known as:  LIPITOR  TAKE 1 TABLET DAILY AT BEDTIME     BACTRIM DS 800-160 MG tablet  Generic drug:  sulfamethoxazole-trimethoprim  Take 1 tablet by mouth 2 (two) times daily.     BD INSULIN SYRINGE ULTRAFINE 31G X 5/16" 1 ML Misc  Generic drug:  Insulin Syringe-Needle U-100  Reported on 02/02/2016     clotrimazole-betamethasone cream  Commonly known as:  LOTRISONE  Apply 1 application topically daily as needed (dry skin, itching). Reported on 02/02/2016     fish oil-omega-3 fatty acids 1000 MG capsule  Take 1 g by mouth 3 (three) times daily. Reported on 02/02/2016     furosemide 80 MG tablet  Commonly known as:  LASIX  Take 1 tablet (80 mg total) by mouth daily. Take additional 40mg  every other day in the evening if weight goes above 134lbs     glipiZIDE 10 MG tablet  Commonly known as:  GLUCOTROL  Take 10 mg by mouth 2 (two) times daily. Reported on 02/02/2016     hydrALAZINE 25 MG tablet  Commonly known as:  APRESOLINE  Take 3 tablets (75 mg total) by mouth 3 (three) times daily.     Iron Tabs  Take 160 mg by mouth 2 (two) times daily.     isosorbide mononitrate 60 MG 24 hr tablet  Commonly known as:  IMDUR  Take 1 tablet (60 mg total) by mouth daily.     LEVEMIR 100 UNIT/ML injection  Generic drug:  insulin detemir  Inject 20 Units into the skin at bedtime.     multivitamin with minerals Tabs tablet  Take 1 tablet by mouth daily.     ONE TOUCH ULTRA TEST test strip  Generic drug:  glucose blood  Reported on 02/02/2016     oxyCODONE-acetaminophen 10-325 MG tablet  Commonly known as:  PERCOCET  Take 1 tablet by mouth every 4 (four) hours as needed for pain.     pantoprazole 40 MG tablet  Commonly known as:  PROTONIX  Take 40 mg by mouth daily.     PROCRIT  IJ  Inject 1 Dose as directed as needed. Reported on 02/02/2016     silver sulfADIAZINE 1 % cream  Commonly known as:  SILVADENE  Apply 1 application topically as needed (dry skin). Reported on 02/02/2016     STOOL SOFTENER 100 MG capsule  Generic drug:  docusate sodium  Take 100 mg by mouth 3 (three) times  a week. Reported on 02/02/2016     tamsulosin 0.4 MG Caps capsule  Commonly known as:  FLOMAX  Take 0.4 mg by mouth. One tablet in the morning and 1/2 tablet in the evening     Vitamin D (Ergocalciferol) 50000 units Caps capsule  Commonly known as:  DRISDOL  Take 50,000 Units by mouth every 7 (seven) days. Reported on 02/02/2016     vitamin E 400 UNIT capsule  Generic drug:  vitamin E  Take 400 Units by mouth daily. Reported on 02/02/2016       Verbal and written Discharge instructions given to the patient. Wound care per Discharge AVS     Follow-up Information    Follow up with Curt Jews, MD In 4 weeks.   Specialties:  Vascular Surgery, Cardiology   Why:  Office will call you to arrange your appt (sent)   Contact information:   [place] [address] [phone]       Signed: Laurence Slate [facility] 02/19/2016, 11:29 AM

## 2016-02-20 ENCOUNTER — Inpatient Hospital Stay (HOSPITAL_COMMUNITY): Payer: BLUE CROSS/BLUE SHIELD

## 2016-02-20 LAB — GLUCOSE, CAPILLARY
GLUCOSE-CAPILLARY: 127 mg/dL — AB (ref 65–99)
GLUCOSE-CAPILLARY: 119 mg/dL — AB (ref 65–99)
Glucose-Capillary: 173 mg/dL — ABNORMAL HIGH (ref 65–99)
Glucose-Capillary: 152 mg/dL — ABNORMAL HIGH (ref 65–99)

## 2016-02-20 MED ORDER — INSULIN ASPART 100 UNIT/ML ~~LOC~~ SOLN
4.0000 [IU] | Freq: Three times a day (TID) | SUBCUTANEOUS | Status: DC
  Administered 2016-02-20 – 2016-02-22 (×4): 4 [IU] via SUBCUTANEOUS

## 2016-02-20 NOTE — Progress Notes (Signed)
[place] [facility] & RE[facility]ABILITATION     PROGRESS NOTE  Subjective/Complaints:  Pt seen laying in bed.  He is sleepy this AM.  He requests the urinal.    ROS:  Denies CP, SOB, N/V/D.  Objective: Vital Signs: Blood pressure 168/54, pulse 82, temperature 98.3 F (36.8 C), temperature source Oral, resp. rate 16, weight 58.9 kg (129 lb 13.6 oz), SpO2 98 %. Dg Orthopantogram  02/18/2016  CLINICAL DATA:  Right lower loose tooth which is capped. EXAM: ORTHOPANTOGRAM/PANORAMIC COMPARISON:  None in PACs no definite acute abnormality. FINDINGS: Reportedly the capped right lateral incisor is the symptomatic region. Deep to the metallic cap normal radiodense tooth tissue is not appreciated. The remaining adjacent lower teeth appear intact. The visualized upper teeth remaining exhibit multiple caps but no definite abnormality. IMPRESSION: This is a limited study. No normal native tooth tissue is demonstrated inferior to the capped lateral incisor. This may indicate carries or abscess. Electronically Signed   By: David  Martinique M.D.   On: 02/18/2016 12:43    Recent Labs  02/18/16 0549  WBC 9.3  HGB 9.3*  HCT 30.0*  PLT 274    Recent Labs  02/18/16 0549 02/19/16 0557  NA 138 140  K 4.0 4.0  CL 111 112*  GLUCOSE 62* 70  BUN 82* 66*  CREATININE 2.05* 1.81*  CALCIUM 8.7* 8.5*   CBG (last 3)   Recent Labs  02/19/16 1637 02/19/16 2102 02/20/16 0637  GLUCAP 132* 171* 127*    Wt Readings from Last 3 Encounters:  02/20/16 58.9 kg (129 lb 13.6 oz)  02/10/16 58.015 kg (127 lb 14.4 oz)  02/05/16 63.05 kg (139 lb)    Physical Exam:  BP 168/54 mmHg  Pulse 82  Temp(Src) 98.3 F (36.8 C) (Oral)  Resp 16  Wt 58.9 kg (129 lb 13.6 oz)  SpO2 98% Constitutional:  He appears well-developed. Frail.   HENT: Normocephalic and atraumatic. Poor dentition, loose tooth Eyes: Conjunctivae and EOM are normal.   Cardiovascular: Normal rate and regular rhythm.Murmur heard. Respiratory:  Breath sounds normal. No stridor. He has no wheezes.  GI: Soft. Bowel sounds are normal. He exhibits no distension. There is no tenderness.  Musculoskeletal: He exhibits no tenderness. He exhibits no edema.  Neurological: He is alert and oriented.  Motor: B/l UE, RLE 5/5 proximal to distal LLE: Hip flexion 5/5  Skin: Skin is warm and dry.  Surgical incision c/d/i, mild erythema lateral edge.   Some ischemic changes to right lower extremity  Coccyx with stage I Psychiatric: He has a normal mood and affect. His behavior is normal.    Assessment/Plan: 1. Functional deficits secondary to left AKA which require 3+ hours per day of interdisciplinary therapy in a comprehensive inpatient rehab setting. Physiatrist is providing close team supervision and 24 hour management of active medical problems listed below. Physiatrist and rehab team continue to assess barriers to discharge/monitor patient progress toward functional and medical goals.  Function:  Bathing Bathing position   Position: Wheelchair/chair at sink  Bathing parts Body parts bathed by patient: Right arm, Left arm, Chest, Abdomen, Front perineal area, Right upper leg, Right lower leg, Left upper leg Body parts bathed by helper: Buttocks, Back  Bathing assist Assist Level: Touching or steadying assistance(Pt > 75%)      Upper Body Dressing/Undressing Upper body dressing   What is the patient wearing?: Button up shirt         Button up shirt - Perfomed by patient: Thread/unthread  right sleeve, Thread/unthread left sleeve, Button/unbutton shirt Button up shirt - Perfomed by helper: Pull shirt around back    Upper body assist Assist Level: Touching or steadying assistance(Pt > 75%)      Lower Body Dressing/Undressing Lower body dressing Lower body dressing/undressing activity did not occur: N/A What is the patient wearing?: Underwear, Pants, Socks Underwear - Performed by patient: Thread/unthread right underwear leg,  Thread/unthread left underwear leg Underwear - Performed by helper: Pull underwear up/down Pants- Performed by patient: Thread/unthread right pants leg, Thread/unthread left pants leg Pants- Performed by helper: Pull pants up/down   Non-skid slipper socks- Performed by helper: Don/doff right sock (due to time constraints)   Socks - Performed by helper: Don/doff right sock              Lower body assist Assist for lower body dressing: Touching or steadying assistance (Pt > 75%)      Toileting Toileting   Toileting steps completed by patient: Adjust clothing prior to toileting, Performs perineal hygiene Toileting steps completed by helper: Adjust clothing after toileting Toileting Assistive Devices: Grab bar or rail  Toileting assist Assist level: Touching or steadying assistance (Pt.75%)   Transfers Chair/bed transfer   Chair/bed transfer method: Squat pivot Chair/bed transfer assist level: Supervision or verbal cues Chair/bed transfer assistive device: Armrests     Locomotion Ambulation     Max distance: 9ft Assist level: Touching or steadying assistance (Pt > 75%)   Wheelchair   Type: Manual Max wheelchair distance: 150 Assist Level: No help, No cues, assistive device, takes more than reasonable amount of time  Cognition Comprehension Comprehension assist level: Follows complex conversation/direction with extra time/assistive device  Expression Expression assist level: Expresses complex ideas: With extra time/assistive device  Social Interaction Social Interaction assist level: Interacts appropriately with others with medication or extra time (anti-anxiety, antidepressant).  Problem Solving Problem solving assist level: Solves complex 90% of the time/cues < 10% of the time  Memory Memory assist level: Recognizes or recalls 90% of the time/requires cueing < 10% of the time    Medical Problem List and Plan: 1. Abnormality of gait secondary to left AKA.  Cont CIR  therapies 2. DVT Prophylaxis/Anticoagulation: Pharmaceutical: Lovenox 3. Pain Management: Continue oxycodone prn and robaxin tid. Added low dose Neurontin to help with neuropathy.  4. Mood: LCSW to follow for evaluation and support.  5. Neuropsych: This patient is capable of making decisions on his own behalf. 6. Skin/Wound Care:   -continue BID dressing with telfa,4x4,kerlix, ACE to keep wound dry.  Prevalon boot for right heel pain.   Pressure relief to sacrum 7. Fluids/Electrolytes/Nutrition: Monitor I/O. Added supplements as intake has been poor.  8. T2DM poor; controlled: Hgb A1c- 8.0. Monitor BS ac/hs.   Continue glucotrol bid   Levemir decreased to 15U qhs on 6/29, decreased at to 10mg  on qhs on 7/3, decreased to 5U on 7/6.  Levamir 5U daily on 7/3, d/ced on 7/6  Novolog 2U TID started on 7/6, increased to 4U on 7/7   Will cont to monitor and consider further adjustments  9. CAD: Monitor for symptoms with activity. On Imdur, ASA and Lipitor.  10. Leucocytosis:   Afebrile  WBCs 9.3 on 7/6, stable  Monitor for signs of infection.   Encourage IS.  11. Acute on chronic renal failure:   Cr. 1.81 on 7/7  Encourage fluids, IVF ordered qhs x2 more  Improving 12. Acute on chronic anemia: Continue iron supplement.   Hb 9.3 on 7/6,  likely dilutional 13. GERD: Symptoms controlled on protonix.  14. HTN: Monitor BP.   Continue Norvasc  Cont Furosemide  Due to AM elevations, Hydralazine increased to 75mg  BID on 6/30 increased to 75mg  TID on 7/3, increased to 100 on 7/7.   Will cont to monitor 15. Poor dentition with loose tooth  Pt seen by dentistry.  Plan for tooth extraction on 7/11.   LOS (Days) 10 A FACE TO FACE EVALUATION WAS PERFORMED  Jamison Soward Lorie Phenix 02/20/2016 8:08 AM

## 2016-02-20 NOTE — Progress Notes (Signed)
Occupational Therapy Session Note  Patient Details  Name: Collin Pearson MRN: [id number] Date of Birth: Nov 28, 1926  Today's Date: 02/20/2016 OT Individual Time: 0902-1002 OT Individual Time Calculation (min): 60 min    Short Term Goals: Week 2:     Skilled Therapeutic Interventions/Progress Updates:    OT session focused on ADL retraining, standing balance, and functional transfers. Pt completed bathing and dressing sit<>stand from w/c at sink with overall min A. Pt required min A sit<>stand 5x with increased time and verbal and tactile cues for upright posture. Pt demonstrates fear of falling, requiring increased time for each transfer. At end of session, pt completed squat pivot transfer w/c>bed with min A.  Therapy Documentation Precautions:  Precautions Precautions: Fall Restrictions Weight Bearing Restrictions: Yes LLE Weight Bearing: Non weight bearing General:   Vital Signs:  Pain: Pain Assessment Pain Assessment: 0-10 Pain Score: 3  Pain Type: Acute pain Pain Location: Hip Pain Orientation: Right;Left Pain Descriptors / Indicators: Aching Pain Frequency: Intermittent Pain Onset: On-going Pain Intervention(s): Medication (See eMAR) ADL: ADL Eating: Modified independent Where Assessed-Eating: Wheelchair Grooming: Setup Where Assessed-Grooming: Wheelchair Upper Body Bathing: Minimal assistance Where Assessed-Upper Body Bathing: Wheelchair Lower Body Bathing: Moderate assistance Where Assessed-Lower Body Bathing: Wheelchair Upper Body Dressing: Moderate assistance Where Assessed-Upper Body Dressing: Wheelchair Lower Body Dressing: Other (Comment) (2 helpers) Where Assessed-Lower Body Dressing: Wheelchair Toileting: Not assessed Toilet Transfer: Not assessed Toilet Transfer Method: Unable to assess Tub/Shower Transfer: Not assessed  See Function Navigator for Current Functional Status.   Therapy/Group: Individual Therapy  Narya Beavin, Quillian Quince 02/20/2016,  10:05 AM

## 2016-02-21 ENCOUNTER — Inpatient Hospital Stay (HOSPITAL_COMMUNITY): Payer: BLUE CROSS/BLUE SHIELD | Admitting: Occupational Therapy

## 2016-02-21 LAB — GLUCOSE, CAPILLARY
GLUCOSE-CAPILLARY: 65 mg/dL (ref 65–99)
GLUCOSE-CAPILLARY: 159 mg/dL — AB (ref 65–99)
Glucose-Capillary: 225 mg/dL — ABNORMAL HIGH (ref 65–99)
Glucose-Capillary: 197 mg/dL — ABNORMAL HIGH (ref 65–99)

## 2016-02-21 NOTE — Progress Notes (Signed)
[place] [facility] MEDIC[facility] & REHABILITATION     PROGRESS NOTE  Subjective/Complaints:  Pt laying in bed.  He states he will do exercises in bed on his day off.   ROS:  Denies CP, SOB, N/V/D.  Objective: Vital Signs: Blood pressure 149/49, pulse 87, temperature 99 F (37.2 C), temperature source Oral, resp. rate 16, weight 61.4 kg (135 lb 5.8 oz), SpO2 96 %. No results found. No results for input(s): WBC, HGB, HCT, PLT in the last 72 hours.  Recent Labs  02/19/16 0557  NA 140  K 4.0  CL 112*  GLUCOSE 70  BUN 66*  CREATININE 1.81*  CALCIUM 8.5*   CBG (last 3)   Recent Labs  02/20/16 1636 02/20/16 2144 02/21/16 0642  GLUCAP 119* 152* 65    Wt Readings from Last 3 Encounters:  02/21/16 61.4 kg (135 lb 5.8 oz)  02/10/16 58.015 kg (127 lb 14.4 oz)  02/05/16 63.05 kg (139 lb)    Physical Exam:  BP 149/49 mmHg  Pulse 87  Temp(Src) 99 F (37.2 C) (Oral)  Resp 16  Wt 61.4 kg (135 lb 5.8 oz)  SpO2 96% Constitutional:  He appears well-developed. Frail.   HENT: Normocephalic and atraumatic. Poor dentition, loose tooth Eyes: Conjunctivae and EOM are normal.   Cardiovascular: Normal rate and regular rhythm.Murmur heard. Respiratory: Breath sounds normal. No stridor. He has no wheezes.  GI: Soft. Bowel sounds are normal. He exhibits no distension. There is no tenderness.  Musculoskeletal: He exhibits no tenderness. He exhibits no edema.  Neurological: He is alert and oriented.  Motor: B/l UE, RLE 5/5 proximal to distal LLE: Hip flexion 5/5  Skin: Skin is warm and dry.  Surgical incision c/d/i, mild erythema lateral edge.   Some ischemic changes to right lower extremity  Coccyx with stage I (stable) Psychiatric: He has a normal mood and affect. His behavior is normal.    Assessment/Plan: 1. Functional deficits secondary to left AKA which require 3+ hours per day of interdisciplinary therapy in a comprehensive inpatient rehab setting. Physiatrist is  providing close team supervision and 24 hour management of active medical problems listed below. Physiatrist and rehab team continue to assess barriers to discharge/monitor patient progress toward functional and medical goals.  Function:  Bathing Bathing position   Position: Wheelchair/chair at sink  Bathing parts Body parts bathed by patient: Right arm, Left arm, Chest, Abdomen, Front perineal area, Right upper leg, Right lower leg, Left upper leg Body parts bathed by helper: Buttocks, Back  Bathing assist Assist Level: Touching or steadying assistance(Pt > 75%)      Upper Body Dressing/Undressing Upper body dressing   What is the patient wearing?: Button up shirt         Button up shirt - Perfomed by patient: Thread/unthread right sleeve, Thread/unthread left sleeve, Button/unbutton shirt Button up shirt - Perfomed by helper: Pull shirt around back    Upper body assist Assist Level: Touching or steadying assistance(Pt > 75%)      Lower Body Dressing/Undressing Lower body dressing Lower body dressing/undressing activity did not occur: N/A What is the patient wearing?: Underwear, Pants, Socks Underwear - Performed by patient: Thread/unthread right underwear leg, Thread/unthread left underwear leg Underwear - Performed by helper: Pull underwear up/down Pants- Performed by patient: Thread/unthread right pants leg, Thread/unthread left pants leg Pants- Performed by helper: Pull pants up/down   Non-skid slipper socks- Performed by helper: Don/doff right sock (due to time constraints)   Socks - Performed by helper:  Don/doff right sock              Lower body assist Assist for lower body dressing: Touching or steadying assistance (Pt > 75%)      Toileting Toileting   Toileting steps completed by patient: Adjust clothing prior to toileting, Performs perineal hygiene Toileting steps completed by helper: Adjust clothing prior to toileting, Performs perineal hygiene, Adjust  clothing after toileting Toileting Assistive Devices: Grab bar or rail  Toileting assist Assist level: Touching or steadying assistance (Pt.75%)   Transfers Chair/bed transfer   Chair/bed transfer method: Squat pivot Chair/bed transfer assist level: Touching or steadying assistance (Pt > 75%) Chair/bed transfer assistive device: Armrests     Locomotion Ambulation     Max distance: 45ft Assist level: Touching or steadying assistance (Pt > 75%)   Wheelchair   Type: Manual Max wheelchair distance: 150 Assist Level: No help, No cues, assistive device, takes more than reasonable amount of time  Cognition Comprehension Comprehension assist level: Follows complex conversation/direction with extra time/assistive device  Expression Expression assist level: Expresses complex ideas: With extra time/assistive device  Social Interaction Social Interaction assist level: Interacts appropriately with others with medication or extra time (anti-anxiety, antidepressant).  Problem Solving Problem solving assist level: Solves complex 90% of the time/cues < 10% of the time  Memory Memory assist level: Recognizes or recalls 90% of the time/requires cueing < 10% of the time    Medical Problem List and Plan: 1. Abnormality of gait secondary to left AKA.  Cont CIR therapies 2. DVT Prophylaxis/Anticoagulation: Pharmaceutical: Lovenox 3. Pain Management: Continue oxycodone prn and robaxin tid. Added low dose Neurontin to help with neuropathy.  4. Mood: LCSW to follow for evaluation and support.  5. Neuropsych: This patient is capable of making decisions on his own behalf. 6. Skin/Wound Care:   -continue BID dressing with telfa,4x4,kerlix, ACE to keep wound dry.  Prevalon boot for right heel pain.   Pressure relief to sacrum 7. Fluids/Electrolytes/Nutrition: Monitor I/O. Added supplements as intake has been poor.  8. T2DM poor; controlled: Hgb A1c- 8.0. Monitor BS ac/hs.   Glucotrol bid d/ced on  7/9  Levemir decreased to 15U qhs on 6/29, decreased at to 10mg  on qhs on 7/3, decreased to 5U on 7/6.  Levamir 5U daily on 7/3, d/ced on 7/6  Novolog 2U TID started on 7/6, increased to 4U on 7/7   Will cont to monitor and consider further adjustments  9. CAD: Monitor for symptoms with activity. On Imdur, ASA and Lipitor.  10. Leucocytosis: Resolved.  Afebrile  WBCs 9.3 on 7/6  Monitor for signs of infection.   Encourage IS.  11. Acute on chronic renal failure:   Cr. 1.81 on 7/7  Encourage fluids, IVF ordered qhs x1 more day  Improving 12. Acute on chronic anemia: Continue iron supplement.   Hb 9.3 on 7/6, likely dilutional 13. GERD: Symptoms controlled on protonix.  14. HTN: Monitor BP.   Continue Norvasc  Cont Furosemide  Due to AM elevations, Hydralazine increased to 75mg  BID on 6/30 increased to 75mg  TID on 7/3, increased to 100 on 7/7.   Overall improved   Will cont to monitor 15. Poor dentition with loose tooth  Pt seen by dentistry.  Plan for tooth extraction on 7/11.   LOS (Days) 11 A FACE TO FACE EVALUATION WAS PERFORMED  Shaylea Ucci Lorie Phenix 02/21/2016 7:11 AM

## 2016-02-21 NOTE — Progress Notes (Signed)
Called by RN for patient lethargic and different than his normal self.  Upon my arrival to patients room, Rn at bedside.  Patient lying in bed sleeping.  Patient is easily aroused, states he just does not feel right.  He states he woke up fine but had some vomiting and now just does not feel good.  C/O of left phantom foot pain.  Denies pain.  VS ok, no fever noted.  Patient is alert and oriented.  Hilary, RN states this is not patients norm.  Advised Rn to monitor patient and update MD on new findings.  Rn to call if assistance needed

## 2016-02-21 NOTE — Progress Notes (Signed)
Patient vomited. Michela Pitcher it came on "in ten seconds." Expressed relief after vomiting, denied further nausea. Vitals taken and within normal. Patient got 650 of tylenol for mild pain, temp was 98.6 so could be affected by tylenol. Am vitals temp was 99. Patient requested to return to bed.  Patient napped until lunch. Difficult to awaken, lethargic. Vitals within normal. Called Rapid Response nurse for evaluation. Changed AKA bandage and no changes to incision site, clean, dry and intact. No redness. Patient complained of stomach pain and mild nausea. Requested trip to the bathroom. Had bowel movement and feels better. Will continue to monitor and pass to oncoming RN.

## 2016-02-21 NOTE — Progress Notes (Signed)
Occupational Therapy Session Note  Patient Details  Name: Collin Pearson MRN: [id number] Date of Birth: 01-16-1927  Today's Date: 02/21/2016 OT Individual Time: 1345-1435 OT Individual Time Calculation (min): 50 min    Skilled Therapeutic Interventions/Progress Updates: patient participation in skilled OT as follows: finishing self feeding with setup; bed to w/c stand piviot transfer=Min A and slow, careful time; washed face, oral care and electric shaved face with supervision,    Don pants sit to stand in w/c at sink with CGA  Wife and dtr present for session     Therapy Documentation Precautions:  Precautions Precautions: Fall Restrictions Weight Bearing Restrictions: Yes LLE Weight Bearing: Non weight bearing General:    Pain:denied      Therapy/Group: Individual Therapy  Alfredia Ferguson [facility] 02/21/2016, 4:46 PM

## 2016-02-22 ENCOUNTER — Inpatient Hospital Stay (HOSPITAL_COMMUNITY): Payer: BLUE CROSS/BLUE SHIELD | Admitting: Physical Therapy

## 2016-02-22 ENCOUNTER — Inpatient Hospital Stay (HOSPITAL_COMMUNITY): Payer: BLUE CROSS/BLUE SHIELD | Admitting: Occupational Therapy

## 2016-02-22 LAB — GLUCOSE, CAPILLARY
GLUCOSE-CAPILLARY: 191 mg/dL — AB (ref 65–99)
GLUCOSE-CAPILLARY: 138 mg/dL — AB (ref 65–99)
Glucose-Capillary: 104 mg/dL — ABNORMAL HIGH (ref 65–99)
Glucose-Capillary: 177 mg/dL — ABNORMAL HIGH (ref 65–99)

## 2016-02-22 MED ORDER — CEFAZOLIN SODIUM-DEXTROSE 2-4 GM/100ML-% IV SOLN
2.0000 g | Freq: Once | INTRAVENOUS | Status: AC
  Administered 2016-02-23: 2 g via INTRAVENOUS
  Filled 2016-02-22: qty 100

## 2016-02-22 MED ORDER — INSULIN ASPART 100 UNIT/ML ~~LOC~~ SOLN
7.0000 [IU] | Freq: Three times a day (TID) | SUBCUTANEOUS | Status: DC
  Administered 2016-02-22 – 2016-02-25 (×7): 7 [IU] via SUBCUTANEOUS

## 2016-02-22 NOTE — Plan of Care (Signed)
Problem: RH Balance Goal: LTG Patient will maintain dynamic standing balance (PT) LTG: Patient will maintain dynamic standing balance with assistance during mobility activities (PT)  With LRAD; downgrade 2/2 slow progress  Problem: RH Ambulation Goal: LTG Patient will ambulate in controlled environment (PT) LTG: Patient will ambulate in a controlled environment, # of feet with assistance (PT).  Outcome: Not Applicable Date Met:  85/27/78 Downgrade to w/c level goals Goal: LTG Patient will ambulate in home environment (PT) LTG: Patient will ambulate in home environment, # of feet with assistance (PT).  Outcome: Not Applicable Date Met:  24/23/53 Downgrade to w/c level goals

## 2016-02-22 NOTE — Progress Notes (Signed)
Physical Therapy Session Note  Patient Details  Name: Collin Pearson MRN: [id number] Date of Birth: 1927-01-04  Today's Date: 02/22/2016  PT Individual Time: 0900-1002 and 1449-1551 PT Individual Time Calculation (min): 62 min and 62 min  Short Term Goals: Week 2:  PT Short Term Goal 1 (Week 2): LTG =STG due to anticipated D/C date.   Skilled Therapeutic Interventions/Progress Updates:    Treatment 1: Pt received in w/c & agreeable to PT, noting 4/10 L residual limb pain & RN notified. [name] residual limb unwrapped and unable to achieve optimal position for re-wrapping with pt sitting in w/c therefore pt performed squat pivot transfer w/c>bed with Mod A with significantly extra time and manual facilitation for weight shifting/pivoting. Pt able to transfer sit<>supine with supervision & bed features. PT provided total A for re-wrapping L residual limb then pt returned to w/c in manner noted above. Pt utilized urinal, (+) void, while sitting in w/c. Provided pt with 16x16 w/c & pt reported improved comfort & fit with new chair. Pt has home measurements & based on those a 16x16 w/c will fit in house. Discussed need for pt to d/c home at w/c level as he is unsafe to ambulate at this time & pt in agreement. Pt propelled w/c room>gym with supervision and PT educated pt on car transfer with RW. [name] daughter arrived & was present for remainder of session. Pt performed car transfer with RW & Min A for stand pivot & cuing to push up with BUE to advance RLE. Discussed with pt & daughter pt's need to d/c home at w/c level & therapist's continued recommendation of HHPT following d/c. [name] daughter reports she does not know if ramp will be installed at pt's house soon & PT reinforced need for ramp as pt is not safe to negotiate stairs at this time. At end of session pt left in w/c in room with daughter present to supervise. Educated daughter to notify RN when she leaves & she voiced understanding; RN made aware  also.  Treatment 2: Pt received in w/c in handoff from RN; pt agreeable to treatment noting 4/10 LLE pain & RN administered medication. Pt reported need to use restroom & PT transported pt into bathroom total A for time management. Pt able to complete squat pivot w/c>elevated toilet with min A & grab bars, but maximum verbal cuing for sequence & technique. PT provided total A for pulling underwear & pants down/up, (+) void. Pt returned to w/c in same manner as noted above & PT educated pt on management of w/c leg rests with pt able to attach leg rest with supervision. Remainder of session focused on w/c mobility & pt/family education. Pt attempted to weave between cones but continued to hit objects; pt propelled to day room & attempted activity again with pt weaving between chairs to simulate "real life" task. Pt able to propel w/c >300 ft with BUE & supervision & ascending a ramp with supervision throughout a controlled environment. Discussed d/c plans with pt & family as pt does not wish to d/c to a SNF. Educated pt & daughter that this therapist is recommending pt d/c home with 24 hr supervision & HHPT; also educated them on renting a ramp if one is not able to be installed prior to d/c. Educated daughter on need for family to be present during therapy sessions through remainder of pt's stay for them to receive hands on training. At end of session pt returned to room where he became  tearful & upset because he feels he is very dependent upon others now. PT provided therapeutic listening & support. Pt left in w/c with daughter present to supervise & all needs within reach.   Therapy Documentation Precautions:  Precautions Precautions: Fall Restrictions Weight Bearing Restrictions: Yes LLE Weight Bearing: Non weight bearing  Pain: Pain Assessment Pain Assessment: 0-10 Pain Score: 4  Pain Location:  (Residual limb) Pain Orientation: Left Pain Intervention(s): RN made aware   See Function  Navigator for Current Functional Status.   Therapy/Group: Individual Therapy  Collin Pearson 02/22/2016, 7:55 AM

## 2016-02-22 NOTE — Progress Notes (Signed)
Occupational Therapy Session Note  Patient Details  Name: Collin Pearson MRN: [id number] Date of Birth: 01-28-1927  Today's Date: 02/22/2016 OT Individual Time: 1055-1210 OT Individual Time Calculation (min): 75 min    Short Term Goals: Week 1:  OT Short Term Goal 1 (Week 1): Pt will complete UB dressing with Min A OT Short Term Goal 2 (Week 1): Pt will complete LB dressing with Mod A OT Short Term Goal 3 (Week 1): Pt will complete UB bathing with supervision  OT Short Term Goal 4 (Week 1): Pt will complete LB bathing with Mod A  OT Short Term Goal 5 (Week 1): Pt will identify 1 desentization technique for managing pain in L LE  Skilled Therapeutic Interventions/Progress Updates:    Pt seen for skilled OT to facilitate ADL skills, sit to stand and standing balance. [name] Limb wrapping had fallen off. Per MD, ok to only use ACE and sock to focus on compression.  Redonned after bathing.  Velcro placed attachment in front for increased ease of use.  Pt did well today demonstrating improved activity tolerance of standing for 2-4 minutes at a time with min A and 1 hand support. Pt was able to get underwear up but needed A for pants as they were larger.  Discussed at length d/c plans with dtr, they are having difficulty getting a ramp built.  She will photograph bathroom for therapists to evaluate set up.  Pt in room with daughter and all needs met.  Therapy Documentation Precautions:  Precautions Precautions: Fall Restrictions Weight Bearing Restrictions: Yes LLE Weight Bearing: Non weight bearing    Pain:   no c/o pain   ADL: See Function Navigator for Current Functional Status.   Therapy/Group: Individual Therapy  [place] 02/22/2016, 12:52 PM

## 2016-02-22 NOTE — Progress Notes (Signed)
Social Work Patient ID: Collin Pearson, male   DOB: April 29, 1927, 82 y.o.   MRN: [id number] Have yet to hear from [facility] regarding coverage for NHP. Team feels will need until Thursday to get education complete and for family to work on ramp and caregivers. Will check with MD and see if ok and work On insurance coverage for this. Work on discharge needs.

## 2016-02-22 NOTE — Progress Notes (Signed)
Social Work Patient ID: Collin Pearson, male   DOB: 04-19-1927, 82 y.o.   MRN: [id number] Spoke with wife to discuss ramp not completed and will not be by discharge and she is wondering if he can stay to get more rehab. Will check with MD and therapy team, looks like downgraded goals for Safety at home. Probably not likely extend stay here. Will also check coverage in NH but [employer] has not covered NH once pt has come to rehab. Will contact them and check on this and get back with wife.

## 2016-02-22 NOTE — Progress Notes (Signed)
Patient with no further incidents or complaints of nausea and vomiting this shift.  Denies discomfort.  Slept quietly throughout the night.    Fredna Dow M

## 2016-02-22 NOTE — Progress Notes (Signed)
[place] PHYSICAL MEDICINE & REHABILITATION     PROGRESS NOTE  Subjective/Complaints:  Pt sitting up in his chair eating breakfast.  He is doing well.  Stump dressing had come off.     ROS:  Denies CP, SOB, N/V/D.  Objective: Vital Signs: Blood pressure 145/51, pulse 81, temperature 98.7 F (37.1 C), temperature source Oral, resp. rate 18, weight 61.236 kg (135 lb), SpO2 96 %. No results found. No results for input(s): WBC, HGB, HCT, PLT in the last 72 hours. No results for input(s): NA, K, CL, GLUCOSE, BUN, CREATININE, CALCIUM in the last 72 hours.  Invalid input(s): CO CBG (last 3)   Recent Labs  02/21/16 1640 02/21/16 2124 02/22/16 0644  GLUCAP 159* 197* 104*    Wt Readings from Last 3 Encounters:  02/22/16 61.236 kg (135 lb)  02/10/16 58.015 kg (127 lb 14.4 oz)  02/05/16 63.05 kg (139 lb)    Physical Exam:  BP 145/51 mmHg  Pulse 81  Temp(Src) 98.7 F (37.1 C) (Oral)  Resp 18  Wt 61.236 kg (135 lb)  SpO2 96% Constitutional:  He appears well-developed. Frail.   HENT: Normocephalic and atraumatic. Poor dentition, loose tooth Eyes: Conjunctivae and EOM are normal.   Cardiovascular: Normal rate and regular rhythm.Murmur heard. Respiratory: Breath sounds normal. No stridor. He has no wheezes.  GI: Soft. Bowel sounds are normal. He exhibits no distension. There is no tenderness.  Musculoskeletal: He exhibits no tenderness. He exhibits no edema.  Neurological: He is alert and oriented.  Motor: B/l UE, RLE 5/5 proximal to distal LLE: Hip flexion 5/5  Skin: Skin is warm and dry.  Surgical incision c/d/i, mild erythema lateral edge (stable).   Some ischemic changes to right lower extremity  Coccyx with stage I (stable) Psychiatric: He has a normal mood and affect. His behavior is normal.    Assessment/Plan: 1. Functional deficits secondary to left AKA which require 3+ hours per day of interdisciplinary therapy in a comprehensive inpatient rehab  setting. Physiatrist is providing close team supervision and 24 hour management of active medical problems listed below. Physiatrist and rehab team continue to assess barriers to discharge/monitor patient progress toward functional and medical goals.  Function:  Bathing Bathing position   Position: Wheelchair/chair at sink  Bathing parts Body parts bathed by patient: Right arm, Left arm, Chest, Abdomen, Front perineal area, Right upper leg, Right lower leg, Left upper leg Body parts bathed by helper: Buttocks, Back  Bathing assist Assist Level: Touching or steadying assistance(Pt > 75%)      Upper Body Dressing/Undressing Upper body dressing   What is the patient wearing?: Button up shirt         Button up shirt - Perfomed by patient: Thread/unthread right sleeve, Thread/unthread left sleeve, Button/unbutton shirt Button up shirt - Perfomed by helper: Pull shirt around back    Upper body assist Assist Level: Touching or steadying assistance(Pt > 75%)      Lower Body Dressing/Undressing Lower body dressing Lower body dressing/undressing activity did not occur: N/A What is the patient wearing?: Underwear, Pants, Socks Underwear - Performed by patient: Thread/unthread right underwear leg, Thread/unthread left underwear leg Underwear - Performed by helper: Pull underwear up/down Pants- Performed by patient: Thread/unthread right pants leg, Thread/unthread left pants leg Pants- Performed by helper: Pull pants up/down   Non-skid slipper socks- Performed by helper: Don/doff right sock (due to time constraints)   Socks - Performed by helper: Don/doff right sock  Lower body assist Assist for lower body dressing: Touching or steadying assistance (Pt > 75%)      Toileting Toileting   Toileting steps completed by patient: Adjust clothing prior to toileting, Performs perineal hygiene Toileting steps completed by helper: Adjust clothing prior to toileting, Performs  perineal hygiene, Adjust clothing after toileting Toileting Assistive Devices: Grab bar or rail  Toileting assist Assist level: Touching or steadying assistance (Pt.75%)   Transfers Chair/bed transfer   Chair/bed transfer method: Squat pivot Chair/bed transfer assist level: Touching or steadying assistance (Pt > 75%) Chair/bed transfer assistive device: Armrests     Locomotion Ambulation     Max distance: 73ft Assist level: Touching or steadying assistance (Pt > 75%)   Wheelchair   Type: Manual Max wheelchair distance: 150 Assist Level: No help, No cues, assistive device, takes more than reasonable amount of time  Cognition Comprehension Comprehension assist level: Follows complex conversation/direction with extra time/assistive device  Expression Expression assist level: Expresses complex ideas: With extra time/assistive device  Social Interaction Social Interaction assist level: Interacts appropriately with others with medication or extra time (anti-anxiety, antidepressant).  Problem Solving Problem solving assist level: Solves complex 90% of the time/cues < 10% of the time  Memory Memory assist level: Recognizes or recalls 90% of the time/requires cueing < 10% of the time    Medical Problem List and Plan: 1. Abnormality of gait secondary to left AKA.  Cont CIR therapies 2. DVT Prophylaxis/Anticoagulation: Pharmaceutical: Lovenox 3. Pain Management: Continue oxycodone prn and robaxin tid. Added low dose Neurontin to help with neuropathy.  4. Mood: LCSW to follow for evaluation and support.  5. Neuropsych: This patient is capable of making decisions on his own behalf. 6. Skin/Wound Care:   -continue BID dressing with telfa,4x4,kerlix, ACE to keep wound dry.  Prevalon boot for right heel pain.   Pressure relief to sacrum 7. Fluids/Electrolytes/Nutrition: Monitor I/O. Added supplements as intake has been poor.  8. T2DM poor; controlled: Hgb A1c- 8.0. Monitor BS ac/hs.    Glucotrol bid d/ced on 7/9  Levemir decreased to 15U qhs on 6/29, decreased at to 10mg  on qhs on 7/3, decreased to 5U on 7/6, d/ced on 7/10.  Levamir 5U daily on 7/3, d/ced on 7/6  Novolog 2U TID started on 7/6, increased to 4U on 7/7, increased to 7U on 7/10   Will cont to monitor and consider further adjustments  9. CAD: Monitor for symptoms with activity. On Imdur, ASA and Lipitor.  10. Leucocytosis: Resolved.  Afebrile  WBCs 9.3 on 7/6  Monitor for signs of infection.   Encourage IS.  11. Acute on chronic renal failure:   Cr. 1.81 on 7/7  Encourage fluids, IVF ordered qhs x1 more day  Improving 12. Acute on chronic anemia: Continue iron supplement.   Hb 9.3 on 7/6, likely dilutional  Labs ordered for tomorrow 13. GERD: Symptoms controlled on protonix.  14. HTN: Monitor BP.   Continue Norvasc  Cont Furosemide  Due to AM elevations, Hydralazine increased to 75mg  BID on 6/30 increased to 75mg  TID on 7/3, increased to 100 on 7/7.   Overall improved   Will cont to monitor 15. Poor dentition with loose tooth  Pt seen by dentistry.  Plan for tooth extraction on 7/11.   LOS (Days) 12 A FACE TO FACE EVALUATION WAS PERFORMED  Ankit Lorie Phenix 02/22/2016 8:21 AM

## 2016-02-23 ENCOUNTER — Encounter (HOSPITAL_COMMUNITY): Payer: Self-pay | Admitting: Anesthesiology

## 2016-02-23 ENCOUNTER — Encounter (HOSPITAL_COMMUNITY)
Admission: RE | Disposition: A | Discharge status: Home Health | Payer: Self-pay | Source: Intra-hospital | Attending: Physical Medicine & Rehabilitation

## 2016-02-23 ENCOUNTER — Inpatient Hospital Stay (HOSPITAL_COMMUNITY): Payer: BLUE CROSS/BLUE SHIELD | Admitting: Anesthesiology

## 2016-02-23 ENCOUNTER — Inpatient Hospital Stay (HOSPITAL_COMMUNITY): Payer: BLUE CROSS/BLUE SHIELD | Admitting: Physical Therapy

## 2016-02-23 ENCOUNTER — Inpatient Hospital Stay (HOSPITAL_COMMUNITY): Admission: RE | Admit: 2016-02-23 | Payer: BLUE CROSS/BLUE SHIELD | Source: Ambulatory Visit | Admitting: Dentistry

## 2016-02-23 ENCOUNTER — Inpatient Hospital Stay (HOSPITAL_COMMUNITY): Payer: BLUE CROSS/BLUE SHIELD | Admitting: Occupational Therapy

## 2016-02-23 DIAGNOSIS — K036 Deposits [accretions] on teeth: Secondary | ICD-10-CM

## 2016-02-23 DIAGNOSIS — K083 Retained dental root: Secondary | ICD-10-CM | POA: Diagnosis present

## 2016-02-23 DIAGNOSIS — K0889 Other specified disorders of teeth and supporting structures: Secondary | ICD-10-CM

## 2016-02-23 DIAGNOSIS — K0530 Chronic periodontitis, unspecified: Secondary | ICD-10-CM

## 2016-02-23 HISTORY — PX: MULTIPLE EXTRACTIONS WITH ALVEOLOPLASTY: SHX5342

## 2016-02-23 LAB — BASIC METABOLIC PANEL
ANION GAP: 9 (ref 5–15)
BUN: 38 mg/dL — ABNORMAL HIGH (ref 6–20)
CALCIUM: 8.9 mg/dL (ref 8.9–10.3)
CO2: 25 mmol/L (ref 22–32)
Chloride: 105 mmol/L (ref 101–111)
Creatinine, Ser: 1.72 mg/dL — ABNORMAL HIGH (ref 0.61–1.24)
GFR, EST AFRICAN AMERICAN: 39 mL/min — AB (ref >60)
GFR, EST NON AFRICAN AMERICAN: 34 mL/min — AB (ref >60)
Glucose, Bld: 127 mg/dL — ABNORMAL HIGH (ref 65–99)
POTASSIUM: 3.8 mmol/L (ref 3.5–5.1)
SODIUM: 139 mmol/L (ref 135–145)

## 2016-02-23 LAB — CBC WITH DIFFERENTIAL/PLATELET
BASOS ABS: 0.1 10*3/uL (ref 0.0–0.1)
BASOS PCT: 1 %
Eosinophils Absolute: 0.7 10*3/uL (ref 0.0–0.7)
Eosinophils Relative: 8 %
HCT: 26.9 % — ABNORMAL LOW (ref 39.0–52.0)
Hemoglobin: 8.3 g/dL — ABNORMAL LOW (ref 13.0–17.0)
LYMPHS PCT: 14 %
Lymphs Abs: 1.3 10*3/uL (ref 0.7–4.0)
MCH: 29.5 pg (ref 26.0–34.0)
MCHC: 30.9 g/dL (ref 30.0–36.0)
MCV: 95.7 fL (ref 78.0–100.0)
MONO ABS: 0.8 10*3/uL (ref 0.1–1.0)
Monocytes Relative: 9 %
NEUTROS ABS: 6.3 10*3/uL (ref 1.7–7.7)
NEUTROS PCT: 68 %
PLATELETS: 241 10*3/uL (ref 150–400)
RBC: 2.81 MIL/uL — ABNORMAL LOW (ref 4.22–5.81)
RDW: 15.1 % (ref 11.5–15.5)
WBC: 9.3 10*3/uL (ref 4.0–10.5)

## 2016-02-23 LAB — GLUCOSE, CAPILLARY
GLUCOSE-CAPILLARY: 202 mg/dL — AB (ref 65–99)
GLUCOSE-CAPILLARY: 258 mg/dL — AB (ref 65–99)
GLUCOSE-CAPILLARY: 119 mg/dL — AB (ref 65–99)
Glucose-Capillary: 159 mg/dL — ABNORMAL HIGH (ref 65–99)

## 2016-02-23 SURGERY — MULTIPLE EXTRACTION WITH ALVEOLOPLASTY
Anesthesia: General | Site: Mouth

## 2016-02-23 MED ORDER — OXYMETAZOLINE HCL 0.05 % NA SOLN
NASAL | Status: AC
  Filled 2016-02-23: qty 15

## 2016-02-23 MED ORDER — SODIUM CHLORIDE 0.9 % IR SOLN
200.0000 mL | Status: DC
  Administered 2016-02-24: 200 mL

## 2016-02-23 MED ORDER — GLUCERNA SHAKE PO LIQD
237.0000 mL | Freq: Three times a day (TID) | ORAL | Status: DC
  Administered 2016-02-24 – 2016-02-25 (×2): 237 mL via ORAL

## 2016-02-23 MED ORDER — OXYCODONE HCL 5 MG/5ML PO SOLN: 5.0000 mg | Freq: Once | ORAL | Status: DC | PRN

## 2016-02-23 MED ORDER — OXYCODONE-ACETAMINOPHEN 10-325 MG PO TABS: 0.5000 | ORAL_TABLET | Freq: Four times a day (QID) | ORAL | Status: DC | PRN

## 2016-02-23 MED ORDER — PROPOFOL 500 MG/50ML IV EMUL
INTRAVENOUS | Status: DC | PRN
  Administered 2016-02-23: 30 ug/kg/min via INTRAVENOUS

## 2016-02-23 MED ORDER — FENTANYL CITRATE (PF) 250 MCG/5ML IJ SOLN
INTRAMUSCULAR | Status: AC
  Filled 2016-02-23: qty 5

## 2016-02-23 MED ORDER — LACTATED RINGERS IV SOLN: INTRAVENOUS | Status: DC

## 2016-02-23 MED ORDER — BUPIVACAINE-EPINEPHRINE 0.5% -1:200000 IJ SOLN
INTRAMUSCULAR | Status: DC | PRN
  Administered 2016-02-23: 1.8 mL

## 2016-02-23 MED ORDER — HEMOSTATIC AGENTS (NO CHARGE) OPTIME
TOPICAL | Status: DC | PRN
  Administered 2016-02-23: 1 via TOPICAL

## 2016-02-23 MED ORDER — LACTATED RINGERS IV SOLN
INTRAVENOUS | Status: DC | PRN
  Administered 2016-02-23: 07:00:00 via INTRAVENOUS

## 2016-02-23 MED ORDER — FENTANYL CITRATE (PF) 100 MCG/2ML IJ SOLN: 25.0000 ug | INTRAMUSCULAR | Status: DC | PRN

## 2016-02-23 MED ORDER — LIDOCAINE-EPINEPHRINE 2 %-1:100000 IJ SOLN
INTRAMUSCULAR | Status: AC
  Filled 2016-02-23: qty 10.2

## 2016-02-23 MED ORDER — STERILE WATER FOR IRRIGATION IR SOLN
Status: DC | PRN
  Administered 2016-02-23: 1000 mL

## 2016-02-23 MED ORDER — ONDANSETRON HCL 4 MG/2ML IJ SOLN: 4.0000 mg | Freq: Four times a day (QID) | INTRAMUSCULAR | Status: DC | PRN

## 2016-02-23 MED ORDER — 0.9 % SODIUM CHLORIDE (POUR BTL) OPTIME
TOPICAL | Status: DC | PRN
  Administered 2016-02-23: 1000 mL

## 2016-02-23 MED ORDER — FENTANYL CITRATE (PF) 100 MCG/2ML IJ SOLN
INTRAMUSCULAR | Status: DC | PRN
  Administered 2016-02-23 (×2): 25 ug via INTRAVENOUS

## 2016-02-23 MED ORDER — DARBEPOETIN ALFA 60 MCG/0.3ML IJ SOSY
60.0000 ug | PREFILLED_SYRINGE | Freq: Once | INTRAMUSCULAR | Status: AC
  Administered 2016-02-23: 60 ug via SUBCUTANEOUS
  Filled 2016-02-23: qty 0.3

## 2016-02-23 MED ORDER — LIDOCAINE-EPINEPHRINE 2 %-1:100000 IJ SOLN
INTRAMUSCULAR | Status: DC | PRN
  Administered 2016-02-23: 1.7 mL via INTRADERMAL

## 2016-02-23 MED ORDER — BUPIVACAINE-EPINEPHRINE (PF) 0.5% -1:200000 IJ SOLN
INTRAMUSCULAR | Status: AC
  Filled 2016-02-23: qty 3.6

## 2016-02-23 MED ORDER — OXYCODONE HCL 5 MG PO TABS: 5.0000 mg | ORAL_TABLET | Freq: Once | ORAL | Status: DC | PRN

## 2016-02-23 MED ORDER — HYDROCODONE-ACETAMINOPHEN 7.5-325 MG/15ML PO SOLN
10.0000 mL | Freq: Four times a day (QID) | ORAL | Status: DC | PRN
  Administered 2016-02-23 – 2016-02-24 (×2): 15 mL via ORAL
  Filled 2016-02-23 (×2): qty 15

## 2016-02-23 SURGICAL SUPPLY — 36 items
ALCOHOL 70% 16 OZ (MISCELLANEOUS) ×3 IMPLANT
ATTRACTOMAT 16X20 MAGNETIC DRP (DRAPES) ×3 IMPLANT
BLADE SURG 15 STRL LF DISP TIS (BLADE) ×2 IMPLANT
BLADE SURG 15 STRL SS (BLADE) ×4
COVER SURGICAL LIGHT HANDLE (MISCELLANEOUS) ×3 IMPLANT
GAUZE PACKING FOLDED 2  STR (GAUZE/BANDAGES/DRESSINGS) ×2
GAUZE PACKING FOLDED 2 STR (GAUZE/BANDAGES/DRESSINGS) ×1 IMPLANT
GAUZE SPONGE 4X4 16PLY XRAY LF (GAUZE/BANDAGES/DRESSINGS) ×3 IMPLANT
GLOVE BIOGEL PI IND STRL 6 (GLOVE) ×1 IMPLANT
GLOVE BIOGEL PI INDICATOR 6 (GLOVE) ×2
GLOVE SURG ORTHO 8.0 STRL STRW (GLOVE) ×3 IMPLANT
GLOVE SURG SS PI 6.0 STRL IVOR (GLOVE) ×3 IMPLANT
GOWN STRL REUS W/ TWL LRG LVL3 (GOWN DISPOSABLE) ×1 IMPLANT
GOWN STRL REUS W/TWL 2XL LVL3 (GOWN DISPOSABLE) ×3 IMPLANT
GOWN STRL REUS W/TWL LRG LVL3 (GOWN DISPOSABLE) ×2
HEMOSTAT SURGICEL 2X14 (HEMOSTASIS) ×3 IMPLANT
KIT BASIN OR (CUSTOM PROCEDURE TRAY) ×3 IMPLANT
KIT ROOM TURNOVER OR (KITS) ×3 IMPLANT
MANIFOLD NEPTUNE WASTE (CANNULA) ×3 IMPLANT
NEEDLE BLUNT 16X1.5 OR ONLY (NEEDLE) ×3 IMPLANT
NS IRRIG 1000ML POUR BTL (IV SOLUTION) ×3 IMPLANT
PACK EENT II TURBAN DRAPE (CUSTOM PROCEDURE TRAY) ×3 IMPLANT
PAD ARMBOARD 7.5X6 YLW CONV (MISCELLANEOUS) ×3 IMPLANT
SPONGE SURGIFOAM ABS GEL 100 (HEMOSTASIS) IMPLANT
SPONGE SURGIFOAM ABS GEL 12-7 (HEMOSTASIS) IMPLANT
SPONGE SURGIFOAM ABS GEL SZ50 (HEMOSTASIS) IMPLANT
SUCTION FRAZIER HANDLE 10FR (MISCELLANEOUS) ×2
SUCTION TUBE FRAZIER 10FR DISP (MISCELLANEOUS) ×1 IMPLANT
SUT CHROMIC 3 0 PS 2 (SUTURE) ×3 IMPLANT
SUT CHROMIC 4 0 P 3 18 (SUTURE) IMPLANT
SYR 50ML SLIP (SYRINGE) ×3 IMPLANT
TOWEL OR 17X26 10 PK STRL BLUE (TOWEL DISPOSABLE) ×3 IMPLANT
TUBE CONNECTING 12'X1/4 (SUCTIONS) ×1
TUBE CONNECTING 12X1/4 (SUCTIONS) ×2 IMPLANT
WATER TABLETS ICX (MISCELLANEOUS) ×3 IMPLANT
YANKAUER SUCT BULB TIP NO VENT (SUCTIONS) ×3 IMPLANT

## 2016-02-23 NOTE — Progress Notes (Signed)
Patient gets procrit every 2+ weeks prn hgb <11.0 per wife. Last dose was about a week prior to surgery. With consult pharm for aranesp.

## 2016-02-23 NOTE — Progress Notes (Signed)
Physical Therapy Session Note  Patient Details  Name: Collin Pearson MRN: [id number] Date of Birth: August 31, 1926  Today's Date: 02/23/2016 PT Individual Time: [id number] and [id number] PT Individual Time Calculation (min): 25 min and 71 min  PT time missed: 35 minutes (dental procedure)  Short Term Goals: Week 2:  PT Short Term Goal 1 (Week 2): LTG =STG due to anticipated D/C date.   Skilled Therapeutic Interventions/Progress Updates:    Treatment 1:Pt received in bed & had just returned from OR for tooth extraction but RN cleared pt for PT. Pt reported 3/10 L residual limb & mouth pain. Pt denied getting out of bed but agreeable to exercises in bed. Pt performed BLE hip abduction, RLE straight leg raises & short arc quads. Discussed home set up with wife who reports she has a temporary ramp installed until friend is able to install a permanent one. Discussed need for pt to have 24 hr supervision following d/c home & pt's wife reports his daughter can stay until the end of the month; also educated pt that PT is recommending HHPT. Educated pt & wife on pt's need to lie prone multiple times each day to maintain L hip extension and prevent contracture. At end of session pt left in bed with all needs within reach & wife present to supervise.   Treatment 2: Pt received asleep in bed but was awakened with multimodal stimulation. Pt noted 6/10 tooth/jaw & LLE pain; RN notified. Pt able to transfer supine>sit with use of bed features & supervision A. Pt performed bed<>w/c transfer with RW & Min A; PT educated pt & family on hand placement, positioning of pt & caregiver & technique for transfer. Pt, daughter & pt's wife able to return demonstrate activity. Pt propelled w/c x 100 ft to ortho gym with supervision & completed stand pivot transfer w/c<>mat table with RW & MIn A from family member. Pt then performed car transfer (seat height = 28 inches) with RW & min A from family member. [name] wife requires  continued cuing for positioning by pt and sequencing of placement of AD's. Educated pt & family on how to fold up RW & w/c. At end of session pt propelled 100 ft back to room with supervision and completed squat pivot transfer w/c>elevated toilet with grab bars, armrests & min A. At end of session pt left on toilet with call bell within reach & education to call for nursing staff when finished; family & RN aware.  Therapy Documentation Precautions:  Precautions Precautions: Fall Restrictions Weight Bearing Restrictions: Yes (L AKA) LLE Weight Bearing: Non weight bearing  Pain: Pain Assessment Pain Assessment: 0-10 Pain Score: 3  Pain Type: Surgical pain Pain Location:  (residual limb & mouth) Pain Orientation: Left Pain Intervention(s): Cold applied   See Function Navigator for Current Functional Status.   Therapy/Group: Individual Therapy  [name] 02/23/2016, 9:35 AM

## 2016-02-23 NOTE — Discharge Instructions (Addendum)
Inpatient Rehab Discharge Instructions  Collin Pearson Discharge date and time:  02/24/16  Activities/Precautions/ Functional Status: Activity: no lifting, driving, or strenuous exercise till cleared by MD.  Diet: diabetic diet No Straws.  Wound Care: Wash with soap and water. Pat dry--keep wound clean and dry. Contact MD if you develop any problems with your incision/wound--redness, swelling, increase in pain, drainage or if you develop fever or chills.   Functional status:  ___ No restrictions     ___ Walk up steps independently _X__ 24/7 supervision/assistance   ___ Walk up steps with assistance ___ Intermittent supervision/assistance  ___ Bathe/dress independently ___ Walk with walker     _X__ Bathe/dress with assistance ___ Walk Independently    ___ Shower independently ___ Walk with assistance    ___ Shower with assistance _X__ No alcohol     ___ Return to work/school ________  Special Instructions: 1. Check blood sugars 2- 3 times a day before meals. Use glucerna for supplement if intake is poor.  2.  Wear shoe when out of bed and protective boot on right foot when in bed 3. Do not use hydrocodone at this time. Bring pain medications bottles for follow up with MD.    COMMUNITY REFERRALS UPON DISCHARGE:    Home Health:   PT, OT, RN  [place]   Date of last service:02/25/2016  Medical Equipment/Items Ordered:WHEELCHAIR, [place]    [phone]   GENERAL COMMUNITY RESOURCES FOR PATIENT/FAMILY: Support Groups:[facility] SUPPORT GROUP EVERY SECOND Tuesday @ 7:00-8:30 PM AT [place] [id number]  My questions have been answered and I understand these instructions. I will adhere to these goals and the provided educational materials after my discharge from the hospital.  Patient/Caregiver Signature _______________________________ Date  __________  Clinician Signature _______________________________________ Date __________  Please bring this form and your medication list with you to all your follow-up doctor's appointments.      MOUTH CARE AFTER SURGERY  FACTS:  Ice used in ice bag helps keep the swelling down, and can help lessen the pain.  It is easier to treat pain BEFORE it happens.  Spitting disturbs the clot and may cause bleeding to start again, or to get worse.  Smoking delays healing and can cause complications.  Sharing prescriptions can be dangerous.  Do not take medications not recently prescribed for you.  Antibiotics may stop birth control pills from working.  Use other means of birth control while on antibiotics.  Warm salt water rinses after the first 24 hours will help lessen the swelling:  Use 1/2 teaspoonful of table salt per oz.of water.  DO NOT:  Do not spit.  Do not drink through a straw.  Strongly advised not to smoke, dip snuff or chew tobacco at least for 3 days.  Do not eat sharp or crunchy foods.  Avoid the area of surgery when chewing.  Do not stop your antibiotics before your instructions say to do so.  Do not eat hot foods until bleeding has stopped.  If you need to, let your food cool down to room temperature.  EXPECT:  Some swelling, especially first 2-3 days.  Soreness or discomfort in varying degrees.  Follow your dentist's instructions about how to handle pain before it starts.  Pinkish saliva or light blood in saliva, or on your pillow in the morning.  This can last around 24 hours.  Bruising inside or outside the mouth.  This  may not show up until 2-3 days after surgery.  Don't worry, it will go away in time.  Pieces of "bone" may work themselves loose.  It's OK.  If they bother you, let us know.  WHAT TO DO IMMEDIATELY AFTER SURGERY:  Bite on the gauze with steady pressure for 1-2 hours.  Don't chew on the gauze.  Do not lie down flat.  Raise your head  support especially for the first 24 hours.  Apply ice to your face on the side of the surgery.  You may apply it 20 minutes on and a few minutes off.  Ice for 8-12 hours.  You may use ice up to 24 hours.  Before the numbness wears off, take a pain pill as instructed.  Prescription pain medication is not always required.  SWELLING:  Expect swelling for the first couple of days.  It should get better after that.  If swelling increases 3 days or so after surgery; let us know as soon as possible.  FEVER:  Take Tylenol every 4 hours if needed to lower your temperature, especially if it is at 100F or higher.  Drink lots of fluids.  If the fever does not go away, let us know.  BREATHING TROUBLE:  Any unusual difficulty breathing means you have to have someone bring you to the emergency room ASAP  BLEEDING:  Light oozing is expected for 24 hours or so.  Prop head up with pillows  Avoid spitting  Do not confuse bright red fresh flowing blood with lots of saliva colored with a little bit of blood.  If you notice some bleeding, place gauze or a tea bag where it is bleeding and apply CONSTANT pressure by biting down for 1 hour.  Avoid talking during this time.  Do not remove the gauze or tea bag during this hour to "check" the bleeding.  If you notice bright RED bleeding FLOWING out of particular area, and filling the floor of your mouth, put a wad of gauze on that area, bite down firmly and constantly.  Call us immediately.  If we're closed, have someone bring you to the emergency room.  ORAL HYGIENE:  Brush your teeth as usual after meals and before bedtime.  Use a soft toothbrush around the area of surgery.  DO NOT AVOID BRUSHING.  Otherwise bacteria(germs) will grow and may delay healing or encourage infection.  Since you cannot spit, just gently rinse and let the water flow out of your mouth.  DO NOT SWISH HARD.  EATING:  Cool liquids are a good point to start.   Increase to soft foods as tolerated.  PRESCRIPTIONS:  Follow the directions for your prescriptions exactly as written.  If Dr. Enrique Sack gave you a narcotic pain medication, do not drive, operate machinery or drink alcohol when on that medication.  QUESTIONS:  Call our office during office hours [phone] or call the Emergency Room at [phone].

## 2016-02-23 NOTE — Op Note (Signed)
OPERATIVE REPORT  Patient:            Collin Pearson Date of Birth:  09/22/1926 MRN:                [id number]   DATE OF PROCEDURE:  02/23/2016  PREOPERATIVE DIAGNOSES: 1. Status post above-the-knee amputation 2. History of loose tooth 3. Retained root segment 4  Chronic periodontitis 5. Accretions  POSTOPERATIVE DIAGNOSES: 1. Status post above-the-knee amputation 2. History of loose tooth 3. Retained root segment 4  Chronic periodontitis 5. Accretions  OPERATIONS: 1. Extraction of retained root #27  2. Lower right quadrant alveoloplasty to achieve primary closure 3. Gross debridement of remaining dentition   SURGEON: Lenn Cal, DDS  ASSISTANT: Camie Patience, (dental assistant)  ANESTHESIA: Monitored anesthesia care per the anesthesia team   MEDICATIONS: 1. Ancef 2 g IV prior to invasive dental procedures. 2. Local anesthesia with a total utilization of 1 carpule each containing 34 mg of lidocaine with 0.017 mg of epinephrine as well as 1 carpule each containing 9 mg of bupivacaine with 0.009 mg of epinephrine.  SPECIMENS: There was one tooth that was discarded.  DRAINS: None  CULTURES: None  COMPLICATIONS: None   ESTIMATED BLOOD LOSS: Less than 10 ML's.  INTRAVENOUS FLUIDS: 350 mLs of Lactated ringers solution.  INDICATIONS: The patient recently had an above-knee amputation with Dr. Donnetta Hutching. The patient was then transferred to inpatient rehabilitation.  A dental consultation was then requested to evaluate a loose tooth.  The patient removed the coronal portion of tooth #27 by himself.  The patient was examined and treatment planned for extraction of retained root segment #27 and other indicated teeth with alveoloplasty as needed along with gross debridement of remaining dentition in the operating room with monitored anesthesia care.  This treatment plan was formulated to decrease the risks and complications associated with dental infection from affecting  the patient's systemic health.  OPERATIVE FINDINGS: Patient was examined operating room number 8.  Retained root #27 were identified for extraction. No other teeth were felt to need extraction at this time. The patient was noted to be affected by a retained root segment #27, chronic periodontitis, and accretions.  DESCRIPTION OF PROCEDURE: Patient was brought to the main operating room number 8. Patient was then placed in the supine position on the operating table. Monitored anesthesia care was then induced per the anesthesia team. The patient was then prepped and draped in the usual manner for dental medicine procedure. A timeout was performed. The patient was identified and procedures were verified. The oral cavity was then thoroughly examined with the findings noted above. The patient was then ready for dental medicine procedure as follows:  Local anesthesia was then administered sequentially with a total utilization of 1 carpules each containing 34 mg of lidocaine with 0.017 mg of epinephrine as well as 1 carpules  each containing 9 mg bupivacaine with 0.009 mg of epinephrine.  At this point time, the mandibular quadrants were approached. The patient was given a mandibular right inferior alveolar nerve block and long buccal nerve blocks utilizing the bupivacaine with epinephrine. Further infiltration was then achieved utilizing the lidocaine with epinephrine. A 15 blade incision was then made from the distal of number 29 and extended to the mesial of #27.  A surgical flap was then carefully reflected. Retained root segment #27 was then subluxated with a series of straight elevators.  Tooth #27 was then removed with a 151 forceps without complications.  Alveoloplasty was  then performed in the area of # 27-29 utilizing a rongeurs and bone file to achieve primary closure. The tissues were approximated and trimmed appropriately. The surgical sites were then irrigated with copious amounts of sterile saline.  A piece of Surgifoam was then placed in extraction socket appropriately. The mandibular right surgical site was then closed from the distal of  29 and extended to the distal of #26 utilizing 3-0 chromic gut suture in a continuous interrupted suture technique 1.   At this point time, the remaining dentition was approached. A gross debridement procedure was then performed utilizing a sonic scaler. A series of hand curettes were then utilized to further remove accretions. A sonic scaler was then again used to further refine removal of accretions.  At this point time, the entire mouth was irrigated with copious amounts of sterile saline. The patient was examined for complications, seeing none, the dental medicine procedure was deemed to be complete. A series of 4 x 4 gauze were placed in the mouth to aid hemostasis. The patient was then handed over to the anesthesia team for final disposition. After an appropriate amount of time, the patient was taken to the postanesthsia care unit in good condition. All counts were correct for the dental medicine procedure. The patient will be admitted back to the rehabilitation unit after an appropriate time and discharge from the postanesthesia care unit.   Lenn Cal, DDS.

## 2016-02-23 NOTE — Discharge Summary (Signed)
Physician Discharge Summary    Patient ID: Collin Pearson MRN: [id number] DOB/AGE: 1927-07-21 82 y.o.  Admit date: 02/10/2016 Discharge date: 02/25/2016  Discharge Diagnoses:  Principal Problem:   Unilateral AKA ([place]) Active Problems:   HTN (hypertension)   Acute on chronic renal failure (HCC)   Diabetes mellitus type 2 in nonobese (HCC)   Anemia of chronic disease   CKD (chronic kidney disease)   Poor dentition   Loosening of tooth   Discharged Condition:  Stable   Significant Diagnostic Studies: Dg Orthopantogram  02/18/2016  CLINICAL DATA:  Right lower loose tooth which is capped. EXAM: ORTHOPANTOGRAM/PANORAMIC COMPARISON:  None in PACs no definite acute abnormality. FINDINGS: Reportedly the capped right lateral incisor is the symptomatic region. Deep to the metallic cap normal radiodense tooth tissue is not appreciated. The remaining adjacent lower teeth appear intact. The visualized upper teeth remaining exhibit multiple caps but no definite abnormality. IMPRESSION: This is a limited study. No normal native tooth tissue is demonstrated inferior to the capped lateral incisor. This may indicate carries or abscess. Electronically Signed   By: David  Martinique M.D.   On: 02/18/2016 12:43    Labs:  Basic Metabolic Panel:  Recent Labs Lab 02/19/16 0557 02/23/16 0403  NA 140 139  K 4.0 3.8  CL 112* 105  CO2 22 25  GLUCOSE 70 127*  BUN 66* 38*  CREATININE 1.81* 1.72*  CALCIUM 8.5* 8.9    CBC:  Recent Labs Lab 02/23/16 0403 02/24/16 0545  WBC 9.3 10.3  NEUTROABS 6.3  --   HGB 8.3* 8.5*  HCT 26.9* 26.9*  MCV 95.7 95.1  PLT 241 235    CBG:  Recent Labs Lab 02/24/16 0701 02/24/16 1201 02/24/16 1616 02/24/16 2010 02/25/16 0643  GLUCAP 209* 146* 174* 175* 196*    Brief HPI:    TRAVIOUS Pearson is a 82 y.o. Male with history of HTN, DM, PAF, CAD with AS s/p CABG with AVR, CKD stage IV, PAD s/p B-carotid stents and history of calcified vessels BLE. He  developed gangrenous changes left foot with severe erythema and skin breakdown of pretibial tissue with severe pain X 1 month and AKA recommended due to inability to salvage limb. He was admitted on 06/26 for L-AKA by Dr. Donnetta Hutching. Post op with phantom pain as well as muscle spasms. Therapy ongoing and CIR recommended for follow up therapy.    Hospital Course: Collin Pearson was admitted to rehab 02/10/2016 for inpatient therapies to consist of PT and OT at least three hours five days a week. Past admission physiatrist, therapy team and rehab RN have worked together to provide customized collaborative inpatient rehab. He continued to have poor po intake and nutritional supplements were offered with meals and protein supplement added to help promote wound healing. He did have acute on chronic renal failure with rise in BUN/Cr to 96/2.31 and was treated with IVF for hydration X 24 hours.  Rehab RN has encouraged fluid intake and BUN/Cr have improved to 38/1.72. Blood pressures were monitored on bid basis and were noted to be poorly controlled. Hydralazine was titrated upwards to 100 mg tid with improved control.  Diabetes was monitored on ac/hs basis and BS have fluctuated greatly due to variable po intake.   He did report difficulty eating due to loosing of lower right tooth and Dr. Enrique Sack was consulted for input.  Crown on tooth #27 did come off and patient was taken to OR for extraction of retained tooth #  27 with lower right alveoplasty and gross debridement of remaining dentition.  ABLA has been monitored and H/H has been on downward trend. Patient on procrit every 2 weeks with last dose 06/14 per RH. He received aranesp 60 mcg on 07/11 and is to resume his regular regimen after discharge.  PRAFO was ordered due to reports of right heel pain and he has been educated on pressure relief measures.  Left AKA incision is healing well without erythema or drainage. Staples remain intact and he is to follow up  with Dr. Donnetta Hutching on 07/25 for staple removal.  His endurance and activity tolerance have greatly improved and he is at min assist to supervision level at discharge. He will continue to receive follow up HHPT and [place] by nd   Rehab course: During patient's stay in rehab weekly team conferences were held to monitor patient's progress, set goals and discuss barriers to discharge. At admission, patient required max assist with mobility and moderate to max assist for ADL tasks. He has had improvement in activity tolerance, balance, postural control, as well as ability to compensate for deficits. He is able to complete ADL tasks with min assist. He requires steady/min assist for squat pivot transfers and is able to propel his wheelchair independently. He is able to ambulate 20' with min assist and rolling walker. Family has been educated on HEP as well as all aspects of care.      Disposition: 01-Home or Self Care  Diet:  Diabetic diet--soft foods. No straws.   Special Instructions: 1. Check blood sugars 2-3 times a day. 2. Contact MD if you develop any problems with your incision/wound--redness, swelling, increase in pain, drainage or if you develop fever or chills.  3. Wash incision with soap and water. Pat dry and apply compressive dressing daily.  4. Contact PMD if blood sugars start running over 150.        Discharge Instructions    Ambulatory referral to Physical Medicine Rehab    Complete by:  As directed   1-2 week follow up/L-BKA             Medication List    STOP taking these medications        BACTRIM DS 800-160 MG tablet  Generic drug:  sulfamethoxazole-trimethoprim     glipiZIDE 10 MG tablet  Commonly known as:  GLUCOTROL     oxyCODONE-acetaminophen 10-325 MG tablet  Commonly known as:  PERCOCET     silver sulfADIAZINE 1 % cream  Commonly known as:  SILVADENE      TAKE these medications        amLODipine 10 MG tablet  Commonly known as:  NORVASC  TAKE 1 TABLET  DAILY     aspirin EC 81 MG tablet  Take 81 mg by mouth 2 (two) times daily.     atorvastatin 40 MG tablet  Commonly known as:  LIPITOR  TAKE 1 TABLET DAILY AT BEDTIME     BD INSULIN SYRINGE ULTRAFINE 31G X 5/16" 1 ML Misc  Generic drug:  Insulin Syringe-Needle U-100  Reported on 02/02/2016     clotrimazole-betamethasone cream  Commonly known as:  LOTRISONE  Apply 1 application topically daily as needed (dry skin, itching). Reported on 02/02/2016     feeding supplement (GLUCERNA SHAKE) Liqd  Take 237 mLs by mouth 3 (three) times daily with meals.  Notes to Patient:  Needs to take a supplement if he eats less than 50% of the meal  fish oil-omega-3 fatty acids 1000 MG capsule  Take 1 g by mouth 3 (three) times daily. Reported on 02/02/2016     furosemide 80 MG tablet  Commonly known as:  LASIX  Take 1 tablet (80 mg total) by mouth daily. Take additional 40mg  every other day in the evening if weight goes above 134lbs     gabapentin 100 MG capsule  Commonly known as:  NEURONTIN  Take 1 capsule (100 mg total) by mouth at bedtime.     hydrALAZINE 100 MG tablet  Commonly known as:  APRESOLINE  Take 1 tablet (100 mg total) by mouth every 8 (eight) hours.  Notes to Patient:  Note increase in medication.      insulin detemir 100 UNIT/ML injection  Commonly known as:  LEVEMIR  Inject 0.07 mLs (7 Units total) into the skin at bedtime.  Notes to Patient:  Note decrease in insulin.  Contact MD if blood sugars trend over 150 consistently.      Iron Tabs  Take 160 mg by mouth 2 (two) times daily.     isosorbide mononitrate 60 MG 24 hr tablet  Commonly known as:  IMDUR  Take 1 tablet (60 mg total) by mouth daily.     multivitamin with minerals Tabs tablet  Take 1 tablet by mouth daily.     ONE TOUCH ULTRA TEST test strip  Generic drug:  glucose blood  Reported on 02/02/2016     oxyCODONE 5 MG immediate release tablet--Rx # 14 pills   Commonly known as:  Oxy IR/ROXICODONE  Take  1 tablet (5 mg total) by mouth every 6 (six) hours as needed for severe pain.  Notes to Patient:  Adjust medication to 1/2- 1 pill every 6-8 hours as needed. Can use tylenol additionally in between.      pantoprazole 40 MG tablet  Commonly known as:  PROTONIX  Take 40 mg by mouth daily.     PROCRIT IJ  Inject 1 Dose as directed as needed. Reported on 02/02/2016     senna-docusate 8.6-50 MG tablet  Commonly known as:  Senokot-S  Take 1 tablet by mouth at bedtime.     STOOL SOFTENER 100 MG capsule  Generic drug:  docusate sodium  Take 100 mg by mouth 3 (three) times a week. Reported on 02/02/2016     tamsulosin 0.4 MG Caps capsule  Commonly known as:  FLOMAX  Take 0.4 mg by mouth. One tablet in the morning and 1/2 tablet in the evening     Vitamin D (Ergocalciferol) 50000 units Caps capsule  Commonly known as:  DRISDOL  Take 50,000 Units by mouth every 7 (seven) days. Reported on 02/02/2016     vitamin E 400 UNIT capsule  Generic drug:  vitamin E  Take 400 Units by mouth daily. Reported on 02/02/2016       Follow-up Information    Follow up with Teena Dunk, DDS. Schedule an appointment as soon as possible for a visit on 03/07/2016.   Specialty:  Dentistry   Why:  For suture removal   Contact information:   [place] [address] [phone]       Follow up with Curt Jews, MD On 03/08/2016.   Specialties:  Vascular Surgery, Cardiology   Why:  appointment at 8:45 am   Contact information:   [place] [address] [phone]       Follow up with Helen Hashimoto., MD On 03/09/2016.   Specialty:  Internal Medicine   Why:  appt @ 3:00 pm   Contact information:   [place] [address] [phone]       Follow up with Ulla Potash., MD. Call today.   Specialty:  Nephrology   Why:  for follow up appointment/Blood pressure and anemia   Contact information:   [place]  [address] [phone]       Follow up with  Delice Lesch MD.   Why:  Office will call you for follow up appointment.    Contact information:   [id number] [place], [place] [address] [phone]      Signed: Bary Leriche 02/25/2016, 5:27 PM

## 2016-02-23 NOTE — Anesthesia Postprocedure Evaluation (Signed)
Anesthesia Post Note  Patient: MIKOL HADLOCK  Procedure(s) Performed: Procedure(s) (LRB):  EXTRACTION OF TOOTH #27 WITH ALVEOLOPLASTY AND GROSS dERIDEMENT OF REMAINING TEETH (N/A)  Patient location during evaluation: PACU Anesthesia Type: MAC Level of consciousness: awake and alert Pain management: pain level controlled Vital Signs Assessment: post-procedure vital signs reviewed and stable Respiratory status: spontaneous breathing, nonlabored ventilation, respiratory function stable and patient connected to nasal cannula oxygen Cardiovascular status: stable and blood pressure returned to baseline Anesthetic complications: no    Last Vitals:  Filed Vitals:   02/23/16 0915 02/23/16 0937  BP: 132/66 159/49  Pulse: 86 93  Temp: 36.6 C 37.1 C  Resp: 16 14    Last Pain:  Filed Vitals:   02/23/16 0938  PainSc: 3                  Srihan Brutus S

## 2016-02-23 NOTE — Progress Notes (Signed)
Occupational Therapy Session Note  Patient Details  Name: Collin Pearson MRN: [id number] Date of Birth: 1926/12/22  Today's Date: 02/23/2016 OT Individual Time: [id number] OT Individual Time Calculation (min): 64 min    Short Term Goals: Week 1:  OT Short Term Goal 1 (Week 1): Pt will complete UB dressing with Min A OT Short Term Goal 2 (Week 1): Pt will complete LB dressing with Mod A OT Short Term Goal 3 (Week 1): Pt will complete UB bathing with supervision  OT Short Term Goal 4 (Week 1): Pt will complete LB bathing with Mod A  OT Short Term Goal 5 (Week 1): Pt will identify 1 desentization technique for managing pain in L LE     Skilled Therapeutic Interventions/Progress Updates:    Pt seen for family education with wife and daughter with a focus on safe handling when pt is standing during dressing and during squat pivot transfers. Demonstrated to spouse, pt transferring bed to w/c with slight touching A. He then B/D at sink level, demonstrating safe sit to stand techniques and how to stabilize him in standing.  Reviewed measurements and photos of home bathroom.  Bathroom set up to simulate home bathroom and pt practiced transfers with slight steadying A. Reviewed at length tub bench set up , use of curtain with bench to prevent water spills, lateral leans for washing bottom and how to wrap limb.  Family agreed that a tub bench would be the best method.  Pt tolerated therapy well despite have dental surgery this morning.  Therapy Documentation Precautions:  Precautions Precautions: Fall Restrictions Weight Bearing Restrictions: Yes (L AKA) LLE Weight Bearing: Non weight bearing  Vital Signs: Therapy Vitals Temp: 98.7 F (37.1 C) Pulse Rate: 93 Resp: 14 BP: (!) 159/49 mmHg Patient Position (if appropriate): Lying Oxygen Therapy SpO2: 96 % O2 Device: Not Delivered Pulse Oximetry Type: Continuous Pain: Pain Assessment Pain Assessment: 0-10 Pain Score: 3  Pain Type:  Surgical pain Pain Location:  (residual limb & mouth) Pain Orientation: Left Pain Intervention(s): Medication (See eMAR) ADL:   See Function Navigator for Current Functional Status.   Therapy/Group: Individual Therapy  Mayeli Bornhorst 02/23/2016, 12:58 PM

## 2016-02-23 NOTE — Patient Care Conference (Signed)
Inpatient RehabilitationTeam Conference and Plan of Care Update Date: 02/23/2016   Time: 10:55 AM    Patient Name: Collin Pearson      Medical Record Number: [id number]  Date of Birth: 1927/04/22 Sex: Male         Room/Bed: 4M03C/4M03C-01 Payor Info: Payor: MEDICARE / Plan: MEDICARE PART A / Product Type: *No Product type* /    Admitting Diagnosis: L AKA unilateral knee amputation retained root segment  Admit Date/Time:  02/10/2016  4:18 PM Admission Comments: No comment available   Primary Diagnosis:  Unilateral AKA ([place]) Principal Problem: Unilateral AKA ([place])  Patient Active Problem List   Diagnosis Date Noted  . Loosening of tooth 02/23/2016  . Poor dentition   . CKD (chronic kidney disease)   . Unilateral AKA ([place]) 02/10/2016  . Gastroesophageal reflux disease with esophagitis   . Anemia of chronic disease   . AKI (acute kidney injury) ([place])   . Status post below knee amputation of left lower extremity ([place])   . Coronary artery disease involving coronary bypass graft of native heart without angina pectoris   . S/P AVR   . Benign essential HTN   . Diabetes mellitus type 2 in nonobese (HCC)   . S/p bilateral carotid endarterectomy   . Chronic kidney disease (CKD), stage IV (severe) ([place])   . Acute blood loss anemia   . Post-operative pain   . Leukocytosis   . PAD (peripheral artery disease) ([place]) 02/08/2016  . Acute on chronic diastolic CHF (congestive heart failure), NYHA class 1 ([place]) 02/09/2015  . Physical deconditioning   . Acute on chronic renal failure ([place]) 02/07/2015  . Atrial fibrillation ([place]) 02/07/2015  . Bradycardia 02/04/2015  . Encounter for nasogastric tube placement   . Endotracheally intubated   . Hypoxia   . Pulmonary edema   . Peripheral arterial disease ([place]) 08/22/2014  . Carotid artery stenosis,Last dopplers 09/20/12 05/29/2013  . Ventral hernia 10/04/2012  . PAF (paroxysmal atrial fibrillation), s/p cabg 06/18/2012  . S/P CABG x 1 with  tissue AVR 06/13/12 06/14/2012  . Rib fractures, several, left side  06/11/2012  . CKD (chronic kidney disease) stage 4, GFR 15-29 ml/min (HCC) 06/11/2012  . HTN (hypertension) 06/06/2012  . Dyslipidemia 06/06/2012  . Diabetes mellitus ([place]) 06/06/2012  . NSTEMI (non-ST elevated myocardial infarction) ([place]) 06/06/2012  . Chronic anemia 06/06/2012  . Acute pulmonary edema ([place]) 06/06/2012  . PVD, with hx of bilateral CEA at [facility], and previous bilateral carotid stenting, with high grade ISR of Rt. carotid 05/18/12 05/18/2012  . Angina effort ([place]) 05/18/2012  . Clotting disorder ([place])   . CAD, patent stent to LAD and mid RCA with occluded OM2 branch     . AS (aortic stenosis), critical stenosis     Expected Discharge Date: Expected Discharge Date: 02/25/16  Team Members Present: Physician leading conference: Dr. Delice Lesch Social Worker Present: Ovidio Kin, LCSW Nurse Present: Heather Roberts, RN PT Present: Lavone Nian, PT OT Present: Meriel Pica, OT     Current Status/Progress Goal Weekly Team Focus  Medical   Abnormality of gait secondary to left AKA s/p tooth extraction  Improve safety, mobility, education  see above   Bowel/Bladder   Continent of Bowel/Bladder. LBM 02/23/16  To remain continent of bowel/bladder with min assist  monitor bowel/bladder and timed toilet patient qh2rs   Swallow/Nutrition/ Hydration             ADL's   min A with transfers, min A with  LB dressing and mod A toileting  min A with basic self care and standing balance.  Pt and family education, ADL retraining, functional mobility training   Mobility   min/mod Afor stand & squat pivot trnasfers, supervision for w/c mobility & bed mobility, moves very slowly, needs assistance for w/c management  min Afor car transfers, supervision & mod I w/c level, min A for standing balance, supervision for bed mobility  transfer training, w/c mobility, pt & family education, endurance training    Communication             Safety/Cognition/ Behavioral Observations            Pain   No complain of pain  <3  Assess and treat pain q shift and as needed   Skin   Left AKA incision with staples. Bottom red barrier cream applied  No new skin breakdown/infection  Assess skin q shift and as needed      *See Care Plan and progress notes for long and short-term goals.  Barriers to Discharge: DM, Acute on chronic anemia, HTN, poor dentition    Possible Resolutions to Barriers:  Follow labs, optimize BP and DM meds, tooth extracted    Discharge Planning/Teaching Needs:  Wife and daughter here today to participate in therapies and learn his care-tooth extracted this am and not able to do am therapies. NH request denied, so plan for home      Team Discussion:  Downgraded goals to supervision wheelchair level due to safety and slowness. Family here for education but tooth extracted this am and still sleepy. MD adjusting diabetes meds. Will need 24 hr supervision at discharge family aware. Will need one more day for education.  Revisions to Treatment Plan:  Downgraded goals to supervision wheelchair level   Continued Need for Acute Rehabilitation Level of Care: The patient requires daily medical management by a physician with specialized training in physical medicine and rehabilitation for the following conditions: Daily direction of a multidisciplinary physical rehabilitation program to ensure safe treatment while eliciting the highest outcome that is of practical value to the patient.: Yes Daily medical management of patient stability for increased activity during participation in an intensive rehabilitation regime.: Yes Daily analysis of laboratory values and/or radiology reports with any subsequent need for medication adjustment of medical intervention for : Wound care problems;Post surgical problems;Renal problems;Blood pressure problems  Elease Hashimoto 02/24/2016, 12:05 PM

## 2016-02-23 NOTE — Progress Notes (Signed)
Collin Pearson PHYSICAL MEDICINE & REHABILITATION     PROGRESS NOTE  Subjective/Complaints:  Pt seen sleeping this AM after procedure.  He is difficult to keep aroused.   ROS:  Denies CP, SOB, N/V/D.  Objective: Vital Signs: Blood pressure 159/49, pulse 93, temperature 98.7 F (37.1 C), temperature source Oral, resp. rate 14, weight 60.464 kg (133 lb 4.8 oz), SpO2 96 %. No results found.  Recent Labs  02/23/16 0403  WBC 9.3  HGB 8.3*  HCT 26.9*  PLT 241    Recent Labs  02/23/16 0403  NA 139  K 3.8  CL 105  GLUCOSE 127*  BUN 38*  CREATININE 1.72*  CALCIUM 8.9   CBG (last 3)   Recent Labs  02/22/16 1634 02/22/16 2058 02/23/16 0820  GLUCAP 191* 138* 202*    Wt Readings from Last 3 Encounters:  02/23/16 60.464 kg (133 lb 4.8 oz)  02/10/16 58.015 kg (127 lb 14.4 oz)  02/05/16 63.05 kg (139 lb)    Physical Exam:  BP 159/49 mmHg  Pulse 93  Temp(Src) 98.7 F (37.1 C) (Oral)  Resp 14  Wt 60.464 kg (133 lb 4.8 oz)  SpO2 96% Constitutional:  He appears well-developed. Frail.   HENT: Normocephalic and atraumatic.  Eyes: Conjunctivae and EOM are normal.   Cardiovascular: Normal rate and regular rhythm.Murmur heard. Respiratory: Breath sounds normal. No stridor. He has no wheezes.  GI: Soft. Bowel sounds are normal. He exhibits no distension. There is no tenderness.  Musculoskeletal: He exhibits no tenderness. He exhibits no edema.  Neurological: Somnolent this AM.  Motor: B/l UE, RLE 5/5 proximal to distal LLE: Hip flexion 5/5  Skin: Skin is warm and dry.  Surgical incision c/d/i, mild erythema lateral edge (stable).   Some ischemic changes to right lower extremity  Coccyx with stage I (stable) Psychiatric: He has a normal mood and affect. His behavior is normal.    Assessment/Plan: 1. Functional deficits secondary to left AKA which require 3+ hours per day of interdisciplinary therapy in a comprehensive inpatient rehab setting. Physiatrist is  providing close team supervision and 24 hour management of active medical problems listed below. Physiatrist and rehab team continue to assess barriers to discharge/monitor patient progress toward functional and medical goals.  Function:  Bathing Bathing position   Position: Wheelchair/chair at sink  Bathing parts Body parts bathed by patient: Right arm, Left arm, Chest, Abdomen, Front perineal area, Right upper leg, Right lower leg, Left upper leg, Buttocks Body parts bathed by helper: Back  Bathing assist Assist Level: Touching or steadying assistance(Pt > 75%)      Upper Body Dressing/Undressing Upper body dressing   What is the patient wearing?: Button up shirt         Button up shirt - Perfomed by patient: Thread/unthread right sleeve, Thread/unthread left sleeve, Button/unbutton shirt Button up shirt - Perfomed by helper: Pull shirt around back    Upper body assist Assist Level: Touching or steadying assistance(Pt > 75%)      Lower Body Dressing/Undressing Lower body dressing Lower body dressing/undressing activity did not occur: N/A What is the patient wearing?: Underwear, Pants, Socks Underwear - Performed by patient: Thread/unthread right underwear leg, Thread/unthread left underwear leg, Pull underwear up/down Underwear - Performed by helper: Pull underwear up/down Pants- Performed by patient: Thread/unthread right pants leg, Thread/unthread left pants leg Pants- Performed by helper: Pull pants up/down   Non-skid slipper socks- Performed by helper: Don/doff right sock   Socks - Performed by helper: Don/doff  right sock              Lower body assist Assist for lower body dressing: Touching or steadying assistance (Pt > 75%)      Toileting Toileting   Toileting steps completed by patient: Adjust clothing prior to toileting, Performs perineal hygiene Toileting steps completed by helper: Adjust clothing prior to toileting, Performs perineal hygiene, Adjust  clothing after toileting Toileting Assistive Devices: Grab bar or rail  Toileting assist Assist level: Touching or steadying assistance (Pt.75%)   Transfers Chair/bed transfer   Chair/bed transfer method: Squat pivot Chair/bed transfer assist level: Moderate assist (Pt 50 - 74%/lift or lower) Chair/bed transfer assistive device: Armrests     Locomotion Ambulation     Max distance: 39ft Assist level: Touching or steadying assistance (Pt > 75%)   Wheelchair   Type: Manual Max wheelchair distance: 300 ft Assist Level: Supervision or verbal cues  Cognition Comprehension Comprehension assist level: Follows basic conversation/direction with no assist  Expression Expression assist level: Expresses basic needs/ideas: With no assist  Social Interaction Social Interaction assist level: Interacts appropriately 90% of the time - Needs monitoring or encouragement for participation or interaction.  Problem Solving Problem solving assist level: Solves complex 90% of the time/cues < 10% of the time  Memory Memory assist level: Recognizes or recalls 90% of the time/requires cueing < 10% of the time    Medical Problem List and Plan: 1. Abnormality of gait secondary to left AKA.  Cont CIR therapies 2. DVT Prophylaxis/Anticoagulation: Pharmaceutical: Lovenox 3. Pain Management: Continue oxycodone prn and robaxin tid. Added low dose Neurontin to help with neuropathy.  4. Mood: LCSW to follow for evaluation and support.  5. Neuropsych: This patient is capable of making decisions on his own behalf. 6. Skin/Wound Care:   -continue BID dressing with telfa,4x4,kerlix, ACE to keep wound dry.  Prevalon boot for right heel pain.   Pressure relief to sacrum 7. Fluids/Electrolytes/Nutrition: Monitor I/O. Added supplements as intake has been poor.  8. T2DM poor; controlled: Hgb A1c- 8.0.Monitor BS ac/hs.   Glucotrol bid d/ced on 7/9  Levemir decreased to 15U qhs on 6/29, decreased at to 10mg  on qhs  on 7/3, decreased to 5U on 7/6, d/ced on 7/10.  Levamir 5U daily on 7/3, d/ced on 7/6  Novolog 2U TID started on 7/6, increased to 4U on 7/7, increased to 7U on 7/10   Will cont to monitor and consider further adjustments  9. CAD: Monitor for symptoms with activity. On Imdur, ASA and Lipitor.  10. Leucocytosis: Resolved.  Afebrile  WBCs 9.3 on 7/11  Monitor for signs of infection.   Encourage IS.  11. Acute on chronic renal failure:   Cr. 1.72 on 7/11  Encourage fluids, IVF completed 12. Acute on chronic anemia: Continue iron supplement.   Hb 8.3 on 7/11  Hemocult stools 13. GERD: Symptoms controlled on protonix.  14. HTN: Monitor BP.   Continue Norvasc  Cont Furosemide  Due to AM elevations, Hydralazine increased to 75mg  BID on 6/30 increased to 75mg  TID on 7/3, increased to 100 on 7/7.   Overall improved   Will cont to monitor 15. Poor dentition with loose tooth  Pt seen by dentistry.  Tooth extraction and debridement on 7/11.   LOS (Days) 13 A FACE TO FACE EVALUATION WAS PERFORMED  Ankit Lorie Phenix 02/23/2016 10:43 AM

## 2016-02-23 NOTE — Anesthesia Preprocedure Evaluation (Addendum)
Anesthesia Evaluation  Patient identified by MRN, date of birth, ID band Patient awake    Reviewed: Allergy & Precautions, H&P , NPO status , Patient's Chart, lab work & pertinent test results  Airway Mallampati: II  TM Distance: >3 FB Neck ROM: full    Dental  (+) Poor Dentition   Pulmonary neg pulmonary ROS,    breath sounds clear to auscultation       Cardiovascular hypertension, Pt. on medications + angina + CAD, + Past MI, + CABG, + Peripheral Vascular Disease and +CHF  + Valvular Problems/Murmurs AS  Rhythm:regular Rate:Normal  S/p AVR   Neuro/Psych Anxiety    GI/Hepatic GERD  Medicated and Controlled,  Endo/Other  diabetes, Type 2  Renal/GU Renal InsufficiencyRenal disease     Musculoskeletal  (+) Arthritis ,   Abdominal   Peds  Hematology   Anesthesia Other Findings   Reproductive/Obstetrics                            Anesthesia Physical Anesthesia Plan  ASA: III  Anesthesia Plan: General   Post-op Pain Management:    Induction: Intravenous  Airway Management Planned: Oral ETT  Additional Equipment:   Intra-op Plan:   Post-operative Plan: Extubation in OR  Informed Consent: I have reviewed the patients History and Physical, chart, labs and discussed the procedure including the risks, benefits and alternatives for the proposed anesthesia with the patient or authorized representative who has indicated his/her understanding and acceptance.     Plan Discussed with: CRNA, Anesthesiologist and Surgeon  Anesthesia Plan Comments:         Anesthesia Quick Evaluation

## 2016-02-23 NOTE — Progress Notes (Addendum)
MEDICATION RELATED CONSULT NOTE - INITIAL   Pharmacy Consult for Aranesp Indication: anemia of CKD  No Known Allergies  Patient Measurements: Weight: 133 lb 4.8 oz (60.464 kg)  Vital Signs: Temp: 98.8 F (37.1 C) (07/11 1538) Temp Source: Oral (07/11 1538) BP: 131/46 mmHg (07/11 1538) Pulse Rate: 85 (07/11 1538) Intake/Output from previous day: 07/10 0701 - 07/11 0700 In: 717 [P.O.:717] Out: 300 [Urine:300] Intake/Output from this shift: Total I/O In: 590 [P.O.:240; I.V.:350] Out: 590 [Urine:575; Blood:15]  Labs:  Recent Labs  02/23/16 0403  WBC 9.3  HGB 8.3*  HCT 26.9*  PLT 241  CREATININE 1.72*   Estimated Creatinine Clearance: 25.9 mL/min (by C-G formula based on Cr of 1.72).   Microbiology: No results found for this or any previous visit (from the past 720 hour(s)).  Medical History: Past Medical History  Diagnosis Date  . Clotting disorder ([place])   . Coronary artery disease   . AS (aortic stenosis)     status post aortic valve replacement with an [employer] model 62 T. fracture bioprosthesis  . Anginal pain ([place])   . Hypertension   . Diabetes mellitus   . Peripheral vascular disease ([place])   . GERD (gastroesophageal reflux disease)   . CKD (chronic kidney disease) stage 4, GFR 15-29 ml/min ([facility])   . PVD (peripheral vascular disease), with hsx of bilateral carotid endarterectomies at [facility], and bilateral carotid stenting 05/18/2012  . Heart murmur   . PAF (paroxysmal atrial fibrillation), s/p cabg 06/18/2012  . Critical lower limb ischemia   . Arthritis   . Anxiety     Medications:  Prescriptions prior to admission  Medication Sig Dispense Refill Last Dose  . amLODipine (NORVASC) 10 MG tablet TAKE 1 TABLET DAILY 90 tablet 2 02/08/2016 at 0415  . aspirin EC 81 MG tablet Take 81 mg by mouth 2 (two) times daily.   Past Week at Unknown time  . atorvastatin (LIPITOR) 40 MG tablet TAKE 1 TABLET DAILY AT BEDTIME 90 tablet 1 02/07/2016 at  Unknown time  . BD INSULIN SYRINGE ULTRAFINE 31G X 5/16" 1 ML MISC Reported on 02/02/2016   02/07/2016 at Unknown time  . clotrimazole-betamethasone (LOTRISONE) cream Apply 1 application topically daily as needed (dry skin, itching). Reported on 02/02/2016   Past Week at Unknown time  . docusate sodium (STOOL SOFTENER) 100 MG capsule Take 100 mg by mouth 3 (three) times a week. Reported on 02/02/2016   02/07/2016 at Unknown time  . Epoetin Alfa (PROCRIT IJ) Inject 1 Dose as directed as needed. Reported on 02/02/2016   10 days ago  . fish oil-omega-3 fatty acids 1000 MG capsule Take 1 g by mouth 3 (three) times daily. Reported on 02/02/2016   02/07/2016 at Unknown time  . furosemide (LASIX) 80 MG tablet Take 1 tablet (80 mg total) by mouth daily. Take additional 40mg  every other day in the evening if weight goes above 134lbs 135 tablet 3 02/07/2016 at Unknown time  . glipiZIDE (GLUCOTROL) 10 MG tablet Take 10 mg by mouth 2 (two) times daily. Reported on 02/02/2016   02/07/2016 at Unknown time  . hydrALAZINE (APRESOLINE) 25 MG tablet Take 3 tablets (75 mg total) by mouth 3 (three) times daily. (Patient taking differently: Take 75 mg by mouth daily. ) 60 tablet 3 02/08/2016 at 0415  . Iron TABS Take 160 mg by mouth 2 (two) times daily.    02/07/2016 at Unknown time  . isosorbide mononitrate (IMDUR) 60 MG 24 hr tablet  Take 1 tablet (60 mg total) by mouth daily. 30 tablet 1 02/08/2016 at 0415  . LEVEMIR 100 UNIT/ML injection Inject 20 Units into the skin at bedtime.   02/07/2016 at Unknown time  . Multiple Vitamin (MULTIVITAMIN WITH MINERALS) TABS Take 1 tablet by mouth daily.   02/07/2016 at Unknown time  . ONE TOUCH ULTRA TEST test strip Reported on 02/02/2016   02/07/2016 at Unknown time  . pantoprazole (PROTONIX) 40 MG tablet Take 40 mg by mouth daily.   02/08/2016 at 0415  . silver sulfADIAZINE (SILVADENE) 1 % cream Apply 1 application topically as needed (dry skin). Reported on 02/02/2016   Past Week at Unknown time  .  sulfamethoxazole-trimethoprim (BACTRIM DS) 800-160 MG tablet Take 1 tablet by mouth 2 (two) times daily.   Past Week at Unknown time  . Tamsulosin HCl (FLOMAX) 0.4 MG CAPS Take 0.4 mg by mouth. One tablet in the morning and 1/2 tablet in the evening   02/08/2016 at 0415  . Vitamin D, Ergocalciferol, (DRISDOL) 50000 units CAPS capsule Take 50,000 Units by mouth every 7 (seven) days. Reported on 02/02/2016   Past Week at Unknown time  . vitamin E (VITAMIN E) 400 UNIT capsule Take 400 Units by mouth daily. Reported on 02/02/2016   Past Week at Unknown time  . [DISCONTINUED] oxyCODONE-acetaminophen (PERCOCET) 10-325 MG tablet Take 1 tablet by mouth every 4 (four) hours as needed for pain.   02/07/2016 at Unknown time    Assessment: 82 y/o male with CKD stage IV admitted to CIR after left AKA on 6/26. Pharmacy consulted for Aranesp dosing. Spoke with Reesa Chew who confirmed patient receives outpatient epoetin at [facility]; provider is Dr. Elmarie Shiley.  I called [facility] and patient has order for 40,000 units SQ every 2 weeks, last given 01/27/16.  Epoetin 20,000 units/week = Aranesp 60 mcg/week  Hgb is low at 8.3. He is on ferrous sulfate bid.   Plan:  Aranesp 60 mcg SQ once Plan is for discharge in the new few days per [place] will sign off for now Please re-order Aranesp weekly if patient is here longer   Renold Genta, PharmD, BCPS Clinical Pharmacist Pager: [phone] 02/23/2016 4:17 PM

## 2016-02-23 NOTE — Progress Notes (Signed)
Pt arrived to pacu with iv infusing from or 

## 2016-02-23 NOTE — Progress Notes (Signed)
Physical Therapy Session Note  Patient Details  Name: SHANTE DENTE MRN: [id number] Date of Birth: 09-10-1926  Today's Date: 02/23/2016 PT Individual Time: 1630-1700 PT Individual Time Calculation (min): 30 min   Short Term Goals: Week 2:  PT Short Term Goal 1 (Week 2): LTG =STG due to anticipated D/C date.  :     Skilled Therapeutic Interventions/Progress Updates:  Tx focused on family training for transfers bed<>WC. Pt/family educated on skin protection, weight shifting in [place], safe guarding principles, sequence of transfers. Pt resting in bed following procedure today, but agreeable to PT.  Pt performed supine>sit with S and increased time required.  Stand-pivot transfer x3 with Min A and RW. Pt needed steadying during turn due to instability single limb. Pt had no LOB, but was only able to minimally clear foot during turn. Family was able to return demonstrate safe technique.  Pt left up in [facility] for dinner. They have no further questions at this time.       Therapy Documentation Precautions:  Precautions Precautions: Fall Restrictions Weight Bearing Restrictions: Yes (L AKA) LLE Weight Bearing: Non weight bearing General:   Vital Signs: Therapy Vitals Temp: 98.8 F (37.1 C) Temp Source: Oral Pulse Rate: 85 Resp: 16 BP: (!) 131/46 mmHg Patient Position (if appropriate): Lying Oxygen Therapy SpO2: 97 % O2 Device: Not Delivered Pain: none   See Function Navigator for Current Functional Status.   Therapy/Group: Individual Therapy  Kennieth Rad, PT, DPT   02/23/2016, 5:11 PM

## 2016-02-23 NOTE — Progress Notes (Signed)
Social Work Patient ID: Collin Pearson, male   DOB: 09-09-1926, 82 y.o.   MRN: [id number] Lorella Nimrod has denied coverage for NH due to feel pt is custodial care. MD has approved him to stay until 7/13 and now tooth removed this am and missing am therapies due to pain and sleepy from surgery. Wife and daughter here to participate in therapies with pt today. Hopefully by 11 will feel up to therapies and has another PT session at 2:00. Will work toward discharge Thursday.

## 2016-02-23 NOTE — Progress Notes (Signed)
PRE-OPERATIVE NOTE:  02/23/2016 Collin Pearson [id number]  VITALS: BP 135/53 mmHg  Pulse 84  Temp(Src) 97.5 F (36.4 C) (Oral)  Resp 16  Wt 133 lb 4.8 oz (60.464 kg)  SpO2 97%  Lab Results  Component Value Date   WBC 9.3 02/23/2016   HGB 8.3* 02/23/2016   HCT 26.9* 02/23/2016   MCV 95.7 02/23/2016   PLT 241 02/23/2016   BMET    Component Value Date/Time   NA 139 02/23/2016 0403   K 3.8 02/23/2016 0403   CL 105 02/23/2016 0403   CO2 25 02/23/2016 0403   GLUCOSE 127* 02/23/2016 0403   BUN 38* 02/23/2016 0403   CREATININE 1.72* 02/23/2016 0403   CREATININE 2.51* 06/02/2015 1537   CALCIUM 8.9 02/23/2016 0403   GFRNONAA 34* 02/23/2016 0403   GFRAA 39* 02/23/2016 0403    Lab Results  Component Value Date   INR 1.22 02/06/2015   INR 1.55* 06/13/2012   INR 1.06 06/12/2012   No results found for: PTT   Collin Pearson presents for extraction of indicated teeth with alveoloplasty and periodontal therapy in the operating room. Will proceed with extraction of retained root #27 and other teeth if needed after further evaluation in the OR. Will proceed with periodontal therapy as patient condition allows.   SUBJECTIVE: The patient denies any acute medical or dental changes and agrees to proceed with treatment as planned.  EXAM: No sign of acute dental changes.  ASSESSMENT: Patient is affected by retained root segment, chronic periodontitis, and accretions  PLAN: Patient agrees to proceed with treatment as planned in the operating room as previously discussed and accepts the risks, benefits, and complications of the proposed treatment. Patient is aware of the risk for bleeding, bruising, swelling, infection, pain, nerve damage, soft tissue damage, and damage to adjacent teeth, sinus involvement, root tip fracture, mandible fracture, and the risks of complications associated with the anesthesia. Patient also is aware of the potential for other complications not  mentioned above.   Lenn Cal, DDS

## 2016-02-23 NOTE — Transfer of Care (Signed)
Immediate Anesthesia Transfer of Care Note  Patient: Collin Pearson  Procedure(s) Performed: Procedure(s):  EXTRACTION OF TOOTH #27 WITH ALVEOLOPLASTY AND GROSS dERIDEMENT OF REMAINING TEETH (N/A)  Patient Location: PACU  Anesthesia Type:MAC  Level of Consciousness: awake and patient cooperative  Airway & Oxygen Therapy: Patient Spontanous Breathing and Patient connected to nasal cannula oxygen  Post-op Assessment: Report given to RN and Post -op Vital signs reviewed and stable  Post vital signs: Reviewed and stable  Last Vitals:  Filed Vitals:   02/22/16 2138 02/23/16 0534  BP: 184/57 135/53  Pulse:  84  Temp:  36.4 C  Resp:  16    Last Pain:  Filed Vitals:   02/23/16 0540  PainSc: Asleep      Patients Stated Pain Goal: 2 ([id number])  Complications: No apparent anesthesia complications

## 2016-02-23 NOTE — Progress Notes (Signed)
Orthopedic Tech Progress Note Patient Details:  Collin Pearson 01-16-1927 [id number]  Patient ID: Collin Pearson, male   DOB: 07-Jun-1927, 82 y.o.   MRN: [id number]   [place] for Stump shrinker.  02/23/2016, 11:19 AM

## 2016-02-24 ENCOUNTER — Inpatient Hospital Stay (HOSPITAL_COMMUNITY): Payer: BLUE CROSS/BLUE SHIELD | Admitting: Physical Therapy

## 2016-02-24 ENCOUNTER — Encounter (HOSPITAL_COMMUNITY): Payer: Self-pay | Admitting: Dentistry

## 2016-02-24 ENCOUNTER — Inpatient Hospital Stay (HOSPITAL_COMMUNITY): Payer: BLUE CROSS/BLUE SHIELD | Admitting: Occupational Therapy

## 2016-02-24 DIAGNOSIS — N189 Chronic kidney disease, unspecified: Secondary | ICD-10-CM

## 2016-02-24 LAB — GLUCOSE, CAPILLARY
GLUCOSE-CAPILLARY: 209 mg/dL — AB (ref 65–99)
GLUCOSE-CAPILLARY: 146 mg/dL — AB (ref 65–99)
Glucose-Capillary: 174 mg/dL — ABNORMAL HIGH (ref 65–99)
Glucose-Capillary: 175 mg/dL — ABNORMAL HIGH (ref 65–99)

## 2016-02-24 LAB — CBC
HCT: 26.9 % — ABNORMAL LOW (ref 39.0–52.0)
HEMOGLOBIN: 8.5 g/dL — AB (ref 13.0–17.0)
MCH: 30 pg (ref 26.0–34.0)
MCHC: 31.6 g/dL (ref 30.0–36.0)
MCV: 95.1 fL (ref 78.0–100.0)
Platelets: 235 10*3/uL (ref 150–400)
RBC: 2.83 MIL/uL — ABNORMAL LOW (ref 4.22–5.81)
RDW: 14.9 % (ref 11.5–15.5)
WBC: 10.3 10*3/uL (ref 4.0–10.5)

## 2016-02-24 MED ORDER — ENOXAPARIN SODIUM 30 MG/0.3ML ~~LOC~~ SOLN
30.0000 mg | SUBCUTANEOUS | Status: DC
  Administered 2016-02-24: 30 mg via SUBCUTANEOUS
  Filled 2016-02-24: qty 0.3

## 2016-02-24 NOTE — Progress Notes (Signed)
[facility]ILITATION     PROGRESS NOTE  Subjective/Complaints:  Pt laying in bed this AM.  He slept well overnight.  He notes his lips are still numb from the procedure yesterday.  ROS:  Denies CP, SOB, N/V/D.  Objective: Vital Signs: Blood pressure 137/50, pulse 83, temperature 99 F (37.2 C), temperature source Oral, resp. rate 16, weight 59.739 kg (131 lb 11.2 oz), SpO2 98 %. No results found.  Recent Labs  02/23/16 0403 02/24/16 0545  WBC 9.3 10.3  HGB 8.3* 8.5*  HCT 26.9* 26.9*  PLT 241 235    Recent Labs  02/23/16 0403  NA 139  K 3.8  CL 105  GLUCOSE 127*  BUN 38*  CREATININE 1.72*  CALCIUM 8.9   CBG (last 3)   Recent Labs  02/23/16 1632 02/23/16 2103 02/24/16 0701  GLUCAP 258* 119* 209*    Wt Readings from Last 3 Encounters:  02/24/16 59.739 kg (131 lb 11.2 oz)  02/10/16 58.015 kg (127 lb 14.4 oz)  02/05/16 63.05 kg (139 lb)    Physical Exam:  BP 137/50 mmHg  Pulse 83  Temp(Src) 99 F (37.2 C) (Oral)  Resp 16  Wt 59.739 kg (131 lb 11.2 oz)  SpO2 98% Constitutional:  He appears well-developed. Frail.   HENT: Normocephalic and atraumatic.  Eyes: Conjunctivae and EOM are normal.   Cardiovascular: Normal rate and regular rhythm.Murmur heard. Respiratory: Breath sounds normal. No stridor. He has no wheezes.  GI: Soft. Bowel sounds are normal. He exhibits no distension. There is no tenderness.  Musculoskeletal: He exhibits no tenderness. He exhibits no edema.  Neurological: Alert and oriented.  Motor: B/l UE, RLE 5/5 proximal to distal LLE: Hip flexion 5/5  Skin: Skin is warm and dry.  Surgical incision c/d/i, mild erythema lateral edge (improving).   Some ischemic changes to right lower extremity  Coccyx with stage I (stable) Psychiatric: He has a normal mood and affect. His behavior is normal.    Assessment/Plan: 1. Functional deficits secondary to left AKA which require 3+ hours per day of  interdisciplinary therapy in a comprehensive inpatient rehab setting. Physiatrist is providing close team supervision and 24 hour management of active medical problems listed below. Physiatrist and rehab team continue to assess barriers to discharge/monitor patient progress toward functional and medical goals.  Function:  Bathing Bathing position   Position: Wheelchair/chair at sink  Bathing parts Body parts bathed by patient: Right arm, Left arm, Chest, Abdomen, Front perineal area, Right upper leg, Right lower leg, Left upper leg, Buttocks Body parts bathed by helper: Back  Bathing assist Assist Level: Touching or steadying assistance(Pt > 75%)      Upper Body Dressing/Undressing Upper body dressing   What is the patient wearing?: Button up shirt         Button up shirt - Perfomed by patient: Thread/unthread right sleeve, Thread/unthread left sleeve, Button/unbutton shirt Button up shirt - Perfomed by helper: Pull shirt around back    Upper body assist Assist Level: Touching or steadying assistance(Pt > 75%)      Lower Body Dressing/Undressing Lower body dressing Lower body dressing/undressing activity did not occur: N/A What is the patient wearing?: Underwear, Pants Underwear - Performed by patient: Thread/unthread right underwear leg, Thread/unthread left underwear leg, Pull underwear up/down Underwear - Performed by helper: Pull underwear up/down Pants- Performed by patient: Thread/unthread right pants leg, Thread/unthread left pants leg Pants- Performed by helper: Pull pants up/down   Non-skid slipper socks- Performed  by helper: Don/doff right sock   Socks - Performed by helper: Don/doff right sock              Lower body assist Assist for lower body dressing: Touching or steadying assistance (Pt > 75%)      Toileting Toileting   Toileting steps completed by patient: Adjust clothing prior to toileting, Performs perineal hygiene Toileting steps completed by  helper: Adjust clothing prior to toileting, Performs perineal hygiene, Adjust clothing after toileting Toileting Assistive Devices: Grab bar or rail  Toileting assist Assist level: Touching or steadying assistance (Pt.75%)   Transfers Chair/bed transfer   Chair/bed transfer method: Stand pivot Chair/bed transfer assist level: Touching or steadying assistance (Pt > 75%) Chair/bed transfer assistive device: Medical sales representative     Max distance: 73ft Assist level: Touching or steadying assistance (Pt > 75%)   Wheelchair   Type: Manual Max wheelchair distance: 100 ft Assist Level: Supervision or verbal cues  Cognition Comprehension Comprehension assist level: Understands basic 75 - 89% of the time/ requires cueing 10 - 24% of the time  Expression Expression assist level: Expresses basic 75 - 89% of the time/requires cueing 10 - 24% of the time. Needs helper to occlude trach/needs to repeat words.  Social Interaction Social Interaction assist level: Interacts appropriately 75 - 89% of the time - Needs redirection for appropriate language or to initiate interaction.  Problem Solving Problem solving assist level: Solves basic 75 - 89% of the time/requires cueing 10 - 24% of the time  Memory Memory assist level: Recognizes or recalls 75 - 89% of the time/requires cueing 10 - 24% of the time    Medical Problem List and Plan: 1. Abnormality of gait secondary to left AKA.  Cont CIR therapies 2. DVT Prophylaxis/Anticoagulation: Pharmaceutical: Lovenox 3. Pain Management: Continue oxycodone prn and robaxin tid. Added low dose Neurontin to help with neuropathy.  4. Mood: LCSW to follow for evaluation and support.  5. Neuropsych: This patient is capable of making decisions on his own behalf. 6. Skin/Wound Care:   -continue BID dressing with telfa,4x4,kerlix, ACE to keep wound dry.  Prevalon boot for right heel pain.   Pressure relief to sacrum 7.  Fluids/Electrolytes/Nutrition: Monitor I/O. Added supplements as intake has been poor.  8. T2DM poor; controlled: Hgb A1c- 8.0.Monitor BS ac/hs.   Glucotrol bid d/ced on 7/9  Levemir decreased to 15U qhs on 6/29, decreased at to 10mg  on qhs on 7/3, decreased to 5U on 7/6, d/ced on 7/10.  Levamir 5U daily on 7/3, d/ced on 7/6  Novolog 2U TID started on 7/6, increased to 4U on 7/7, increased to 7U on 7/10  Overall improved, labile values post-procedure   Will cont to monitor and consider further adjustments  9. CAD: Monitor for symptoms with activity. On Imdur, ASA and Lipitor.  10. Leucocytosis: Resolved.  Afebrile  WBCs 10.3 on 7/11  Monitor for signs of infection.   Encourage IS.  11. Acute on chronic renal failure:   Cr. 1.72 on 7/11  Encourage fluids, IVF completed 12. Acute on chronic anemia: Continue iron supplement.   Hb 8.5 on 7/12  Hemocult stools 13. GERD: Symptoms controlled on protonix.  14. HTN: Monitor BP.   Continue Norvasc  Cont Furosemide  Due to AM elevations, Hydralazine increased to 75mg  BID on 6/30 increased to 75mg  TID on 7/3, increased to 100 on 7/7.   Overall improved   Will cont to monitor 15. Poor dentition with loose  tooth  Pt seen by dentistry.  Tooth extraction and debridement on 7/11.   LOS (Days) 14 A FACE TO FACE EVALUATION WAS PERFORMED  Ankit Lorie Phenix 02/24/2016 8:12 AM

## 2016-02-24 NOTE — Plan of Care (Signed)
Problem: RH Balance Goal: LTG Patient will maintain dynamic standing balance (PT) LTG: Patient will maintain dynamic standing balance with assistance during mobility activities (PT)  Outcome: Completed/Met Date Met:  02/24/16 With RW  Problem: RH Bed to Chair Transfers Goal: LTG Patient will perform bed/chair transfers w/assist (PT) LTG: Patient will perform bed/chair transfers with assistance, with/without cues (PT).  Outcome: Not Met (add Reason) Requires min A for safety with RW  Problem: RH Car Transfers Goal: LTG Patient will perform car transfers with assist (PT) LTG: Patient will perform car transfers with assistance (PT).  Outcome: Completed/Met Date Met:  02/24/16 With RW  Problem: RH Wheelchair Mobility Goal: LTG Patient will propel w/c in controlled environment (PT) LTG: Patient will propel wheelchair in controlled environment, # of feet with assist (PT)  Outcome: Completed/Met Date Met:  02/24/16 150 ft Goal: LTG Patient will propel w/c in home environment (PT) LTG: Patient will propel wheelchair in home environment, # of feet with assistance (PT).  Outcome: Completed/Met Date Met:  02/24/16 50 ft  Problem: RH Stairs Goal: LTG Patient will ambulate up and down stairs w/assist (PT) LTG: Patient will ambulate up and down # of stairs with assistance (PT)  Outcome: Completed/Met Date Met:  02/24/16 8 steps (3 inches) with B rails

## 2016-02-24 NOTE — Progress Notes (Signed)
Occupational Therapy Session Note  Patient Details  Name: Collin Pearson MRN: [id number] Date of Birth: 1927-02-23  Today's Date: 02/24/2016 OT Individual Time: [id number] OT Individual Time Calculation (min): 31 min    Skilled Therapeutic Interventions/Progress Updates:    Pt today was sleeping in bed with daughter present upon skilled OT arrival. Per daughter report, all necessary DME has been purchased and in her car. Skilled OT inquired if there were any remaining needs at time of discharge. Daughter reported wanting a HEP for pt to continue with UB strengthening at home. Pt was provided with individualized HEP with orange theraband today in tx. Pt sat at EOB, and completed UB exercises with Min A for [facility] instruction on proper technique and daughter educated as needed. Pt and daughter were provided education on goal attainment with verbalized feelings of pride. At end of session, pt was returned to bed with bed alarm set and all needs within reach/daughter present.  Therapy Documentation Precautions:  Precautions Precautions: Fall Precaution Comments: L AKA Restrictions Weight Bearing Restrictions: Yes LLE Weight Bearing: Non weight bearing  Pain: No c/o pain   ADL: ADL Eating: Modified independent Where Assessed-Eating: Wheelchair Grooming: Setup Where Assessed-Grooming: Wheelchair Upper Body Bathing: Minimal assistance Where Assessed-Upper Body Bathing: Wheelchair Lower Body Bathing: Moderate assistance Where Assessed-Lower Body Bathing: Wheelchair Upper Body Dressing: Moderate assistance Where Assessed-Upper Body Dressing: Wheelchair Lower Body Dressing: Other (Comment) (2 helpers) Where Assessed-Lower Body Dressing: Wheelchair Toileting: Not assessed Toilet Transfer: Not assessed Toilet Transfer Method: Unable to assess Tub/Shower Transfer: Not assessed ADL Comments: min A overall Exercises:  Orange theraband: Tricep extension; bicep curls; horizontal abduction;  deltoid flexion , shoulder retraction x10 reps, 1 set Other Treatments: warmup stretches: shoulder rolls, shoulder shrugs, shoulder retraction/protraction x10 reps 1 set See Function Navigator for Current Functional Status.   Therapy/Group: Individual Therapy  Lateisha Thurlow A Andrey Hoobler 02/24/2016, 6:20 PM

## 2016-02-24 NOTE — Progress Notes (Signed)
Physical Therapy Session Note  Patient Details  Name: Collin Pearson MRN: [id number] Date of Birth: 12-08-1926  Today's Date: 02/24/2016 PT Individual Time: 9539-6728 PT Individual Time Calculation (min): 44 min   Short Term Goals: Week 1:  PT Short Term Goal 1 (Week 1): Pt will roll R/L with Min A for skin protection in bed PT Short Term Goal 1 - Progress (Week 1): Met PT Short Term Goal 2 (Week 1): Pt will move supine<>sit with Min A PT Short Term Goal 2 - Progress (Week 1): Met PT Short Term Goal 3 (Week 1): Pt will perform basic bed<>transfer with consistent Mod A PT Short Term Goal 3 - Progress (Week 1): Met PT Short Term Goal 4 (Week 1): Pt will ambulate 60' with Min A and RW PT Short Term Goal 4 - Progress (Week 1): Progressing toward goal PT Short Term Goal 5 (Week 1): Pt will initiate stair training PT Short Term Goal 5 - Progress (Week 1): Met Week 2:  PT Short Term Goal 1 (Week 2): LTG =STG due to anticipated D/C date.   Skilled Therapeutic Interventions/Progress Updates:    Patient received asleep in recliner. Aroused easily.  Squat pivot to [facility] with min A from PT as well as heavy cues for proper UE positioning. Patient performed additional squat pivot transers x 3 throughout treatment session with min a from PT and mod cues to improve safety of transfer.  Gait training over uneven surface for 60f with mod A from PT and mod cues for Ad management and proper step length to prevent LOB.   Standing therex. BUE support with RW  LLE hip extension x 12  LLE hip abduction x 12 PT provided mod multimodal cues for improved technique increased decreased compensation from the trunk and improve speed of movement and ROM.   Patient performed sit<>supine and roll to stomach with supervision A from PT with min cues for UE placement to improve success on transfer.  Prone hip extension x 10.   Patient returned to room and left supine in bed with daughter present and call bell within  reahc.    Therapy Documentation Precautions:  Precautions Precautions: Fall Precaution Comments: L AKA Restrictions Weight Bearing Restrictions: Yes LLE Weight Bearing: Non weight bearing General:   Vital Signs: Therapy Vitals Temp: 98.2 F (36.8 C) Temp Source: Rectal Pulse Rate: 81 Resp: 16 BP: (!) 137/48 mmHg Patient Position (if appropriate): Lying Oxygen Therapy SpO2: 96 % O2 Device: Not Delivered Pain: Pain Assessment Pain Score: 0-No pain   See Function Navigator for Current Functional Status.   Therapy/Group: Individual Therapy  ALorie Phenix7/07/2016, 4:30 PM

## 2016-02-24 NOTE — Progress Notes (Signed)
Occupational Therapy Session Note  Patient Details  Name: BRANDT CHANEY MRN: [id number] Date of Birth: [birth year]  Today's Date: 02/24/2016 OT Individual Time: 1045-1200 OT Individual Time Calculation (min): 75 min    Short Term Goals: Week 1:  OT Short Term Goal 1 (Week 1): Pt will complete UB dressing with Min A OT Short Term Goal 2 (Week 1): Pt will complete LB dressing with Mod A OT Short Term Goal 3 (Week 1): Pt will complete UB bathing with supervision  OT Short Term Goal 4 (Week 1): Pt will complete LB bathing with Mod A  OT Short Term Goal 5 (Week 1): Pt will identify 1 desentization technique for managing pain in L LE  Skilled Therapeutic Interventions/Progress Updates:    Pt seen for skilled OT to facilitate functional mobility and activity tolerance with ADL retraining and family education. [name] daughter present for education. Pt asleep in recliner but woke easily. Used squat pivot transfers the entire session with only touching A. His daughter was able to assist her father without difficulty with transfers. Used ADL apt to instruct dtr in setting up bench, shower curtain to avoid water spillage. Education on limb wrapping with plastic for shower. Pt completed transfers easily and used lateral leans for washing bottom. To increase safety with LB dressing, avoided standing with RW and pt used w/c pushups to elevate his hips to allow his dtr to pull his pants over his hips. Pt was able to do underwear but has more difficulty with dress pants. Reviewed this technique for clothing management with toileting using BSC over toilet.  Pt tolerated session well and stated he felt great. Daughter stated she felt confident with the techniques.  Pt taken back to room with all needs met.   Therapy Documentation Precautions:  Precautions Precautions: Fall Precaution Comments: L AKA Restrictions Weight Bearing Restrictions: Yes LLE Weight Bearing: Non weight bearing  Pain:  no c/o pain    ADL: See Function Navigator for Current Functional Status.   Therapy/Group: Individual Therapy  SAGUIER,JULIA 02/24/2016, 9:09 AM

## 2016-02-24 NOTE — Progress Notes (Signed)
Orthopedic Tech Progress Note Patient Details:  Collin Pearson 10/16/1926 [id number]  Patient ID: [name], male   DOB: 1927/06/04, 82 y.o.   MRN: [id number] Called in bio-tech brace order; spoke with Dolores Lory, Taji Barretto 02/24/2016, 3:02 PM

## 2016-02-24 NOTE — Progress Notes (Signed)
Physical Therapy Discharge Summary  Patient Details  Name: Collin Pearson MRN: [id number] Date of Birth: 1926/11/08  Today's Date: 02/24/2016 PT Individual Time: 8144-8185 PT Individual Time Calculation (min): 57 min    Patient has met 7 of 8 long term goals due to improved activity tolerance, improved balance, increased strength, decreased pain, ability to compensate for deficits and improved awareness.  Patient to discharge at a wheelchair level Supervision.   Patient's care partner is independent to provide the necessary physical and cognitive assistance at discharge.  Reasons goals not met: Pt requires min A for transfers 2/2 decreased balance & safety awareness.  Recommendation:  Patient will benefit from ongoing skilled PT services in home health setting to continue to advance safe functional mobility, address ongoing impairments in decreased LLE ROM, decreased standing balance, decreased endurance, decreased safety with ambulation, and minimize fall risk.  Equipment: 16x16 w/c, 3-in-1 BSC  Reasons for discharge: treatment goals met  Patient/family agrees with progress made and goals achieved: Yes  Skilled PT treatment: Pt received in w/c & agreeable to PT, noting 2/10 pain in LLE. Pt able to complete car transfer with Min A via stand pivot with RW & cuing for sequencing, with car set at 28 inch seat height. Pt and daughter performed stand pivot transfer w/c<>bed in same manner as noted prior, with cuing from PT for w/c positioning & safety with task. Pt able to roll in bed & transfer supine<>sit with supervision. Gait training x 20 ft completed with pt able to push up with BUE to unload RLE but minimal RLE foot clearance. Stair training completed on 3" steps with pt able to negotiate 8 steps with B rails & min A. PT provided cuing for pt to place RLE completely on next step. Pt able to propel w/c throughout controlled environment with Mod I but with supervision in home environment.  Pt continues to require supervision for w/c parts management. PT performed manual testing; please see below for further details. Pt reports he has no concerns regarding d/c home. At end of session pt left sitting in w/c in room with daughter present.   PT Discharge Precautions/Restrictions Precautions Precautions: Fall Precaution Comments: L AKA Restrictions Weight Bearing Restrictions: Yes LLE Weight Bearing: Non weight bearing  Pain Pain Assessment Pain Assessment: 0-10 Pain Score: 2  Pain Type: Surgical pain Pain Location:  (residual limb) Pain Orientation: Left Pain Descriptors / Indicators: Sore Pain Intervention(s): Ambulation/increased activity  Vision/Perception  Pt reports wearing glasses at all times at baseline; pt denies any changes in vision since admission to CIR.  Cognition Overall Cognitive Status: Within Functional Limits for tasks assessed Orientation Level: Oriented to place;Oriented to situation;Oriented to time;Oriented to person;Oriented X4 Awareness: Impaired Safety/Judgment: Impaired  Sensation Sensation Light Touch: Appears Intact (BLE equal ) Proprioception: Appears Intact (RLE) Coordination Gross Motor Movements are Fluid and Coordinated: Yes  Mobility Bed Mobility Bed Mobility: Rolling Right;Rolling Left;Supine to Sit;Sit to Supine (apartment bed) Rolling Right: 5: Supervision Rolling Left: 5: Supervision Supine to Sit: 5: Supervision Sit to Supine: 5: Supervision Transfers Transfers: Yes Sit to Stand: 4: Min assist;4: Min guard Stand to Sit: 4: Min guard;4: Min Gaffer Transfers: 4: Min assist (with RW)  Locomotion  Ambulation Ambulation: Yes Ambulation/Gait Assistance: 4: Min assist Ambulation Distance (Feet): 20 Feet Assistive device: Rolling walker Gait Gait: Yes Gait Pattern: Decreased step length - left (step-to gait pattern, decreased gait speed) Stairs / Additional Locomotion Stairs: Yes Stairs Assistance: 3:  Mod assist Stairs  Assistance Details: Verbal cues for technique Stair Management Technique: Two rails Number of Stairs: 8 Height of Stairs: 3 (inches) Wheelchair Mobility Wheelchair Mobility: Yes Wheelchair Assistance: 5: Supervision;6: Modified independent (Device/Increase time) (supervision in home environment; mod I in controlled environment) Environmental health practitioner: Both upper extremities Wheelchair Parts Management: Supervision/cueing Distance: 300 ft   Balance Balance Balance Assessed: Yes Theatre stage manager Standing - Balance Support: Bilateral upper extremity supported Static Standing - Level of Assistance: 4: Min assist  Extremity Assessment  RLE Assessment RLE Assessment: Exceptions to [facility] RLE AROM (degrees) Overall AROM Right Lower Extremity: Within functional limits for tasks assessed RLE Strength Right Hip Flexion: 4/5 Right Knee Flexion: 4+/5 Right Knee Extension: 4+/5 Right Ankle Dorsiflexion: 3+/5 LLE Assessment LLE Assessment:  (difficulty achieving neutral hip extension AROM)   See Function Navigator for Current Functional Status.  [name] 02/24/2016, 9:37 AM

## 2016-02-24 NOTE — Progress Notes (Signed)
Orthopedic Tech Progress Note Patient Details:  Collin Pearson Aug 12, 1927 [id number]  Patient ID: [name], male   DOB: 07/21/1927, 82 y.o.   MRN: [id number] RN stated that pt does not need stump shrinker for rle  Hildred Priest 02/24/2016, 2:48 PM

## 2016-02-24 NOTE — Progress Notes (Signed)
Nutrition Follow-up  DOCUMENTATION CODES:   Non-severe (moderate) malnutrition in context of chronic illness  INTERVENTION:  Continue Glucerna Shake po TID, each supplement provides 220 kcal and 10 grams of protein.  Continue 30 ml Prostat po once daily, each supplement provides 100 kcal and 15 grams of protein.   Encourage adequate PO intake.   NUTRITION DIAGNOSIS:   Malnutrition related to chronic illness as evidenced by energy intake < 75% for > or equal to 1 month, moderate depletions of muscle mass; ongoing  GOAL:   Patient will meet greater than or equal to 90% of their needs; met  MONITOR:   PO intake, Supplement acceptance, Weight trends, Labs, I & O's, Skin  REASON FOR ASSESSMENT:   Consult Poor PO  ASSESSMENT:   82 y.o. Male with history of HTN, DM, PAF, CAD with AS s/p CABG with AVR, CKD stage IV, PAD s/p B-carotid stents and history of calcified vessels BLE. He developed gangrenous changes left foot with severe erythema and skin breakdown of pretibial tissue with severe pain X 1 month and AKA recommended due to inability to salvage limb. He was admitted on 06/26 for L-AKA by Dr. Donnetta Hutching. Post op with phantom pain as well as muscle spasms.  Meal completion has been 50-100%. Pt currently has Glucerna Shake and Prostat ordered and has been consuming them. RD to continue with current orders. Pt encouraged to eat his food at meals and to drink his supplements.   Labs and medications reviewed.   Diet Order:  DIET DYS 2 Room service appropriate?: Yes; Fluid consistency:: Thin  Skin:  Wound (see comment) (Stage I on sacrum)  Last BM:  7/11  Height:   Ht Readings from Last 1 Encounters:  02/05/16 5' 5.5" (1.664 m)    Weight:   Wt Readings from Last 1 Encounters:  02/24/16 131 lb 11.2 oz (59.739 kg)    Ideal Body Weight:  58 kg (adjusted for L AKA)  BMI:  Body mass index is 21.58 kg/(m^2).  Estimated Nutritional Needs:   Kcal:  1700-1850  Protein:   75-85 grams  Fluid:  1.7 - 1.8 L/day  EDUCATION NEEDS:   Education needs addressed  Corrin Parker, MS, RD, LDN Pager # [phone] After hours/ weekend pager # [phone]

## 2016-02-25 ENCOUNTER — Telehealth (HOSPITAL_COMMUNITY): Payer: Self-pay

## 2016-02-25 LAB — GLUCOSE, CAPILLARY: Glucose-Capillary: 196 mg/dL — ABNORMAL HIGH (ref 65–99)

## 2016-02-25 MED ORDER — OXYCODONE HCL 5 MG PO TABS: 5.0000 mg | ORAL_TABLET | Freq: Four times a day (QID) | ORAL | Status: DC | PRN

## 2016-02-25 MED ORDER — GLUCERNA SHAKE PO LIQD: 237.0000 mL | Freq: Three times a day (TID) | ORAL | Status: DC

## 2016-02-25 MED ORDER — INSULIN DETEMIR 100 UNIT/ML ~~LOC~~ SOLN: 7.0000 [IU] | Freq: Every day | SUBCUTANEOUS | Status: AC

## 2016-02-25 MED ORDER — SENNOSIDES-DOCUSATE SODIUM 8.6-50 MG PO TABS: 1.0000 | ORAL_TABLET | Freq: Every day | ORAL | Status: DC

## 2016-02-25 MED ORDER — HYDRALAZINE HCL 100 MG PO TABS: 100.0000 mg | ORAL_TABLET | Freq: Three times a day (TID) | ORAL | Status: AC

## 2016-02-25 MED ORDER — GABAPENTIN 100 MG PO CAPS: 100.0000 mg | ORAL_CAPSULE | Freq: Every day | ORAL | Status: DC

## 2016-02-25 NOTE — Progress Notes (Signed)
[place] PHYSICAL MEDICINE & REHABILITATION     PROGRESS NOTE  Subjective/Complaints:  Pt laying in bed this AM.  He is ready for discharge today.   ROS:  Denies CP, SOB, N/V/D.  Objective: Vital Signs: Blood pressure 138/50, pulse 85, temperature 98.2 F (36.8 C), temperature source Oral, resp. rate 18, weight 59.149 kg (130 lb 6.4 oz), SpO2 96 %. No results found.  Recent Labs  02/23/16 0403 02/24/16 0545  WBC 9.3 10.3  HGB 8.3* 8.5*  HCT 26.9* 26.9*  PLT 241 235    Recent Labs  02/23/16 0403  NA 139  K 3.8  CL 105  GLUCOSE 127*  BUN 38*  CREATININE 1.72*  CALCIUM 8.9   CBG (last 3)   Recent Labs  02/24/16 1616 02/24/16 2010 02/25/16 0643  GLUCAP 174* 175* 196*    Wt Readings from Last 3 Encounters:  02/25/16 59.149 kg (130 lb 6.4 oz)  02/10/16 58.015 kg (127 lb 14.4 oz)  02/05/16 63.05 kg (139 lb)    Physical Exam:  BP 138/50 mmHg  Pulse 85  Temp(Src) 98.2 F (36.8 C) (Oral)  Resp 18  Wt 59.149 kg (130 lb 6.4 oz)  SpO2 96% Constitutional:  He appears well-developed. Frail.   HENT: Normocephalic and atraumatic.  Eyes: Conjunctivae and EOM are normal.   Cardiovascular: Normal rate and regular rhythm.Murmur heard. Respiratory: Breath sounds normal. No stridor. He has no wheezes.  GI: Soft. Bowel sounds are normal. He exhibits no distension. There is no tenderness.  Musculoskeletal: He exhibits no tenderness. He exhibits no edema.  Neurological: Alert and oriented.  Motor: B/l UE, RLE 5/5 proximal to distal LLE: Hip flexion 5/5  Skin: Skin is warm and dry.  Surgical incision c/d/i, mild erythema lateral edge (improving).   Some ischemic changes to right lower extremity  Coccyx with stage I (stable) Psychiatric: He has a normal mood and affect. His behavior is normal.    Assessment/Plan: 1. Functional deficits secondary to left AKA which require 3+ hours per day of interdisciplinary therapy in a comprehensive inpatient rehab  setting. Physiatrist is providing close team supervision and 24 hour management of active medical problems listed below. Physiatrist and rehab team continue to assess barriers to discharge/monitor patient progress toward functional and medical goals.  Function:  Bathing Bathing position   Position: Shower  Bathing parts Body parts bathed by patient: Right arm, Left arm, Chest, Abdomen, Front perineal area, Right upper leg, Right lower leg, Left upper leg, Buttocks Body parts bathed by helper: Back  Bathing assist Assist Level: Supervision or verbal cues      Upper Body Dressing/Undressing Upper body dressing   What is the patient wearing?: Button up shirt         Button up shirt - Perfomed by patient: Thread/unthread right sleeve, Thread/unthread left sleeve, Button/unbutton shirt Button up shirt - Perfomed by helper: Pull shirt around back    Upper body assist Assist Level: Touching or steadying assistance(Pt > 75%)      Lower Body Dressing/Undressing Lower body dressing Lower body dressing/undressing activity did not occur: N/A What is the patient wearing?: Underwear, Pants, Socks, Shoes Underwear - Performed by patient: Thread/unthread right underwear leg, Thread/unthread left underwear leg, Pull underwear up/down Underwear - Performed by helper: Pull underwear up/down Pants- Performed by patient: Thread/unthread right pants leg, Thread/unthread left pants leg, Fasten/unfasten pants Pants- Performed by helper: Pull pants up/down   Non-skid slipper socks- Performed by helper: Don/doff right sock   Socks -  Performed by helper: Don/doff right sock   Shoes - Performed by helper: Don/doff right shoe, Fasten right          Lower body assist Assist for lower body dressing: Touching or steadying assistance (Pt > 75%)      Toileting Toileting   Toileting steps completed by patient: Adjust clothing prior to toileting, Performs perineal hygiene Toileting steps completed by  helper: Performs perineal hygiene, Adjust clothing after toileting Toileting Assistive Devices: Grab bar or rail  Toileting assist Assist level: Touching or steadying assistance (Pt.75%)   Transfers Chair/bed transfer   Chair/bed transfer method: Squat pivot Chair/bed transfer assist level: Touching or steadying assistance (Pt > 75%) Chair/bed transfer assistive device: Armrests     Locomotion Ambulation     Max distance: 20 ft Assist level: Touching or steadying assistance (Pt > 75%)   Wheelchair Wheelchair activity did not occur:  (mod I for controlled environment, supervision for home environment) Type: Manual Max wheelchair distance: 200 ft Assist Level: Supervision or verbal cues, No help, No cues, assistive device, takes more than reasonable amount of time  Cognition Comprehension Comprehension assist level: Follows complex conversation/direction with no assist  Expression Expression assist level: Expresses complex ideas: With no assist  Social Interaction Social Interaction assist level: Interacts appropriately with others - No medications needed.  Problem Solving Problem solving assist level: Solves complex problems: Recognizes & self-corrects  Memory Memory assist level: Complete Independence: No helper    Medical Problem List and Plan: 1. Abnormality of gait secondary to left AKA.  DC today  Will see pt in 1-2 weeks for transitional care management 2. DVT Prophylaxis/Anticoagulation: Pharmaceutical: Lovenox 3. Pain Management: Continue oxycodone prn and robaxin tid. Added low dose Neurontin to help with neuropathy.  4. Mood: LCSW to follow for evaluation and support.  5. Neuropsych: This patient is capable of making decisions on his own behalf. 6. Skin/Wound Care:   -continue BID dressing with telfa,4x4,kerlix, ACE to keep wound dry.  Prevalon boot for right heel pain.   Pressure relief to sacrum 7. Fluids/Electrolytes/Nutrition: Monitor I/O. Added supplements  as intake has been poor.  8. T2DM poor; controlled: Hgb A1c- 8.0.Monitor BS ac/hs.   Glucotrol bid d/ced on 7/9  Levemir decreased to 15U qhs on 6/29, decreased at to 10mg  on qhs on 7/3, decreased to 5U on 7/6, d/ced on 7/10.  Levamir 5U daily on 7/3, d/ced on 7/6  Novolog 2U TID started on 7/6, increased to 4U on 7/7, increased to 7U on 7/10  Overall improved  Will need further adjustments as outpt 9. CAD: Monitor for symptoms with activity. On Imdur, ASA and Lipitor.  10. Leucocytosis: Resolved.  Afebrile  WBCs 10.3 on 7/11  Monitor for signs of infection.   Encourage IS.  11. Acute on chronic renal failure:   Cr. 1.72 on 7/11  Encourage fluids, IVF completed 12. Acute on chronic anemia: Continue iron supplement.   Hb 8.5 on 7/12  Pt on procrit as outpt 13. GERD: Symptoms controlled on protonix.  14. HTN: Monitor BP.   Continue Norvasc  Cont Furosemide  Due to AM elevations, Hydralazine increased to 75mg  BID on 6/30 increased to 75mg  TID on 7/3, increased to 100 on 7/7.   Overall improved, random spike last 24 hours ?reliability   Will cont to monitor 15. Poor dentition with loose tooth  Pt seen by dentistry.  Tooth extraction and debridement on 7/11.   LOS (Days) 15 A FACE TO FACE EVALUATION WAS PERFORMED  Collin Pearson 02/25/2016 8:04 AM

## 2016-02-25 NOTE — Progress Notes (Signed)
Patient with wife and daughter at bedside given discharge information by Algis Liming PA, and all questions answered. All belongings packed by patient's wife and daughter and taken with patient. Patient assisted to car via wheelchair with nurse tech assistance. [place]

## 2016-02-25 NOTE — Telephone Encounter (Signed)
02/25/16            Called to schedule F/U appt. w/Dr. Enrique Sack after surgcial extraction.  Patient will F/U at his dentist per Lannie Fields.  If patient has any problems patient is to call Dental Medicine.  LRI

## 2016-02-25 NOTE — Progress Notes (Signed)
Social Work  Discharge Note  The overall goal for the admission was met for:   Discharge location: Yes-HOME WITH DAUGHTER TO STAY A SHORT TIME AND WIFE TO PROVIDE 24 HR CARE  Length of Stay: Yes-16 DAYS  Discharge activity level: Yes-SUPERVISION WHEELCHAIR LEVEL  Home/community participation: Yes  Services provided included: MD, RD, PT, OT, SLP, RN, CM, TR, Pharmacy and SW  Financial Services: Medicare and Private Insurance: [place]  Follow-up services arranged: Home Health: [facility] CARE-PT, OT, RN, DME: [place] and Patient/Family request agency HH: HAD BEFORE, DME: HAD BEFORE  Comments (or additional information):FAMILY IN FOR EDUCATION AWARE PT WILL REQUIRE 24 HR CARE DUE TO SAFETY AND NEEDED CARE. HAVING RAMP BUILT THIS WEEKEND. [place] DENIED NHP DUE TO ON REHAB AND PT CONSIDERED CUSTODIAL CARE  Patient/Family verbalized understanding of follow-up arrangements: Yes  Individual responsible for coordination of the follow-up plan: LINDA-WIFE & CRYSTAL-DAUGHTER  Confirmed correct DME delivered: Elease Hashimoto 02/25/2016    Elease Hashimoto

## 2016-03-01 ENCOUNTER — Telehealth: Payer: Self-pay

## 2016-03-01 NOTE — Telephone Encounter (Signed)
Daughter called and requested clearance from Vascular Surgery standpoint for pt. To have his teeth cleaned.  Discussed with Dr. Donnetta Hutching.  Advised there is no contraindication to cleaning his teeth at this time.  Notified Murray Hodgkins @ [employer].  Will fax note of clearance for dental hygiene.

## 2016-03-03 ENCOUNTER — Encounter: Payer: Self-pay | Admitting: Vascular Surgery

## 2016-03-03 ENCOUNTER — Encounter: Payer: BLUE CROSS/BLUE SHIELD | Admitting: Physical Medicine & Rehabilitation

## 2016-03-08 ENCOUNTER — Ambulatory Visit (INDEPENDENT_AMBULATORY_CARE_PROVIDER_SITE_OTHER): Payer: Medicare Other | Admitting: Vascular Surgery

## 2016-03-08 ENCOUNTER — Encounter: Payer: Self-pay | Admitting: Vascular Surgery

## 2016-03-08 VITALS — BP 125/64 | HR 75 | Temp 97.9°F | Resp 14 | Ht 65.5 in | Wt 139.0 lb

## 2016-03-08 DIAGNOSIS — I70229 Atherosclerosis of native arteries of extremities with rest pain, unspecified extremity: Secondary | ICD-10-CM

## 2016-03-08 DIAGNOSIS — I998 Other disorder of circulatory system: Secondary | ICD-10-CM

## 2016-03-08 DIAGNOSIS — Z89612 Acquired absence of left leg above knee: Secondary | ICD-10-CM

## 2016-03-08 MED ORDER — GABAPENTIN 100 MG PO CAPS: 100.0000 mg | ORAL_CAPSULE | Freq: Every day | ORAL | 8 refills | Status: DC

## 2016-03-08 NOTE — Progress Notes (Signed)
Patient name: Collin Pearson MRN: [id number] DOB: [birth year] Sex: male Left above-knee amputation REASON FOR VISIT: Ear today for follow-up of left above-knee amputation for critical limb ischemia and gangrene of his left foot on 02/08/2016. He is here today with his family.  HPI: Collin Pearson is a 82 y.o. male is essentially well after his inpatient redilatation and his outpatient PT and OT.  Current Outpatient Prescriptions  Medication Sig Dispense Refill  . amLODipine (NORVASC) 10 MG tablet TAKE 1 TABLET DAILY 90 tablet 2  . aspirin EC 81 MG tablet Take 81 mg by mouth 2 (two) times daily.    Marland Kitchen atorvastatin (LIPITOR) 40 MG tablet TAKE 1 TABLET DAILY AT BEDTIME 90 tablet 1  . BD INSULIN SYRINGE ULTRAFINE 31G X 5/16" 1 ML MISC Reported on 02/02/2016    . clotrimazole-betamethasone (LOTRISONE) cream Apply 1 application topically daily as needed (dry skin, itching). Reported on 02/02/2016    . docusate sodium (STOOL SOFTENER) 100 MG capsule Take 100 mg by mouth 3 (three) times a week. Reported on 02/02/2016    . Epoetin Alfa (PROCRIT IJ) Inject 1 Dose as directed as needed. Reported on 02/02/2016    . feeding supplement, GLUCERNA SHAKE, (GLUCERNA SHAKE) LIQD Take 237 mLs by mouth 3 (three) times daily with meals.  0  . fish oil-omega-3 fatty acids 1000 MG capsule Take 1 g by mouth 3 (three) times daily. Reported on 02/02/2016    . furosemide (LASIX) 80 MG tablet Take 1 tablet (80 mg total) by mouth daily. Take additional 40mg  every other day in the evening if weight goes above 134lbs 135 tablet 3  . gabapentin (NEURONTIN) 100 MG capsule Take 1 capsule (100 mg total) by mouth at bedtime. 90 capsule 8  . glipiZIDE (GLUCOTROL) 5 MG tablet Take 5 mg by mouth.    . hydrALAZINE (APRESOLINE) 100 MG tablet Take 1 tablet (100 mg total) by mouth every 8 (eight) hours. 90 tablet 0  . insulin detemir (LEVEMIR) 100 UNIT/ML injection Inject 0.07 mLs (7 Units total) into the  skin at bedtime. 10 mL 11  . Iron TABS Take 160 mg by mouth 2 (two) times daily.     . isosorbide mononitrate (IMDUR) 60 MG 24 hr tablet Take 1 tablet (60 mg total) by mouth daily. 30 tablet 1  . metFORMIN (GLUCOPHAGE) 500 MG tablet Take 500 mg by mouth 2 (two) times daily with a meal.    . Multiple Vitamin (MULTIVITAMIN WITH MINERALS) TABS Take 1 tablet by mouth daily.    . ONE TOUCH ULTRA TEST test strip Reported on 02/02/2016    . oxyCODONE (OXY IR/ROXICODONE) 5 MG immediate release tablet Take 1 tablet (5 mg total) by mouth every 6 (six) hours as needed for severe pain. 14 tablet 0  . pantoprazole (PROTONIX) 40 MG tablet Take 40 mg by mouth daily.    Marland Kitchen senna-docusate (SENOKOT-S) 8.6-50 MG tablet Take 1 tablet by mouth at bedtime. 30 tablet 0  . Tamsulosin HCl (FLOMAX) 0.4 MG CAPS Take 0.4 mg by mouth. One tablet in the morning and 1/2 tablet in the evening    . Vitamin D, Ergocalciferol, (DRISDOL) 50000 units CAPS capsule Take 50,000 Units by mouth every 7 (seven) days. Reported on 02/02/2016    . vitamin E (VITAMIN E) 400 UNIT capsule Take 400 Units by mouth daily. Reported on 02/02/2016     No current facility-administered medications for this visit.      PHYSICAL EXAM: Vitals:  03/08/16 0836  BP: 125/64  Pulse: 75  Resp: 14  Temp: 97.9 F (36.6 C)  TempSrc: Oral  SpO2: 98%  Weight: 139 lb (63 kg)  Height: 5' 5.5" (1.664 m)    GENERAL: The patient is a well-nourished male, in no acute distress. The vital signs are documented above. Left above-knee amputation well-healed with no evidence of skin breakdown  MEDICAL ISSUES: Stable overall. Will be discharged from our care today. Will have his staples removed. He'll call us if he has any difficulty. Is connected with the process this. I had a very open and honest discussion with the patient and his family present. At his advanced age of 42 and other medical conditions. Will be a challenge for him to walk with a prosthesis but this  certainly is not impossible. He will continue to push himself and hopefully will be able to do this. Will notify should he develop any future difficulties  Rosetta Posner, MD [facility] Tel [phone] Pager [phone]

## 2016-03-10 ENCOUNTER — Telehealth: Payer: Self-pay | Admitting: Cardiovascular Disease

## 2016-03-10 NOTE — Telephone Encounter (Signed)
New message      The daughter was calling to let Dr. Gwenlyn Found know the pt has been in hospital cause he had to have his left leg amputated above the knee. This was just a [place]

## 2016-03-10 NOTE — Telephone Encounter (Signed)
Returned call to patient. Asked if there was anything else I needed to do for him. He said he was fine he just wanted to let Dr Gwenlyn Found know. Will route to Dr Gwenlyn Found as an Juluis Rainier.

## 2016-03-11 NOTE — Telephone Encounter (Signed)
I was aware that he had his left leg amputated by Dr. early

## 2016-04-09 ENCOUNTER — Other Ambulatory Visit: Payer: Self-pay | Admitting: Cardiovascular Disease

## 2016-04-09 DIAGNOSIS — Z79899 Other long term (current) drug therapy: Secondary | ICD-10-CM

## 2016-04-11 NOTE — Telephone Encounter (Signed)
Rx request sent to pharmacy.  

## 2016-04-22 DIAGNOSIS — I2510 Atherosclerotic heart disease of native coronary artery without angina pectoris: Secondary | ICD-10-CM | POA: Diagnosis not present

## 2016-04-22 DIAGNOSIS — Z481 Encounter for planned postprocedural wound closure: Secondary | ICD-10-CM | POA: Diagnosis not present

## 2016-04-22 DIAGNOSIS — E1122 Type 2 diabetes mellitus with diabetic chronic kidney disease: Secondary | ICD-10-CM | POA: Diagnosis not present

## 2016-04-22 DIAGNOSIS — E1152 Type 2 diabetes mellitus with diabetic peripheral angiopathy with gangrene: Secondary | ICD-10-CM | POA: Diagnosis not present

## 2016-04-29 ENCOUNTER — Telehealth: Payer: Self-pay | Admitting: Cardiovascular Disease

## 2016-04-29 NOTE — Telephone Encounter (Signed)
Left detailed message on wifes cell number to have patient take an extra 1/2 of Furosemide every other day starting today If no improvement or worse call on call MD over weekend Left message on home number to call back Monday after 8:30

## 2016-04-29 NOTE — Telephone Encounter (Signed)
Spoke with patients wife regarding swelling Per wife patient has been having swelling in foot, leg, hand, and now possibly abdomen. She is unable to get daily weights secondary AKA and no prosthesis yet.  The swelling does go down some at night, has been going on for the last week.  Denies any shortness of breath Wife does try to watch sodium intake but admits he has probably had increased intake lately. Confirmed Furosemide 80 mg daily but she has not given any extra, sig: 1/2 extra every other day if weight above 134. Has appointment with Donnie Aho PA on 9/26 and Dr Gwenlyn Found on 10/6 Recommended starting the Furosemide 1/2 tablet every other day and call back Monday if no improvement or on call MD if worse over the weekend.  Wife states she is very concerned secondary to admission June 2016 and does not want him to get into that shape again.  Advised would discuss with DOD and call back Will forward to Dr Ellyn Hack DOD for review

## 2016-04-29 NOTE — Telephone Encounter (Signed)
New message  Pts wife feels that he has gained weight but she can't lift him. Pt is in a wheel chair.    Pt c/o swelling: STAT is pt has developed SOB within 24 hours  1. How long have you been experiencing swelling? yesterday  2. Where is the swelling located? Foot, leg, right hand  3.  Are you currently taking a "fluid pill"? Not sure  4.  Are you currently SOB? no  5.  Have you traveled recently? no

## 2016-04-29 NOTE — Telephone Encounter (Signed)
Agree with recommendation.  Glenetta Hew, MD

## 2016-04-29 NOTE — Telephone Encounter (Signed)
Left message to call back  

## 2016-05-10 ENCOUNTER — Ambulatory Visit: Payer: Self-pay | Admitting: Physician Assistant

## 2016-05-20 ENCOUNTER — Ambulatory Visit (INDEPENDENT_AMBULATORY_CARE_PROVIDER_SITE_OTHER): Payer: BLUE CROSS/BLUE SHIELD | Admitting: Cardiovascular Disease

## 2016-05-20 ENCOUNTER — Encounter: Payer: Self-pay | Admitting: Cardiovascular Disease

## 2016-05-20 DIAGNOSIS — I6523 Occlusion and stenosis of bilateral carotid arteries: Secondary | ICD-10-CM

## 2016-05-20 DIAGNOSIS — E785 Hyperlipidemia, unspecified: Secondary | ICD-10-CM | POA: Diagnosis not present

## 2016-05-20 DIAGNOSIS — I10 Essential (primary) hypertension: Secondary | ICD-10-CM | POA: Diagnosis not present

## 2016-05-20 DIAGNOSIS — I739 Peripheral vascular disease, unspecified: Secondary | ICD-10-CM | POA: Diagnosis not present

## 2016-05-20 DIAGNOSIS — Z952 Presence of prosthetic heart valve: Secondary | ICD-10-CM

## 2016-05-20 DIAGNOSIS — Z951 Presence of aortocoronary bypass graft: Secondary | ICD-10-CM

## 2016-05-20 NOTE — Patient Instructions (Signed)
Medication Instructions:  NO CHANGES.   Follow-Up: Your physician wants you to follow-up in: 6 MONTHS WITH DR BERRY. You will receive a reminder letter in the mail two months in advance. If you don't receive a letter, please call our office to schedule the follow-up appointment.  If you need a refill on your cardiac medications before your next appointment, please call your pharmacy.     

## 2016-05-20 NOTE — Progress Notes (Signed)
05/20/2016 Collin Pearson   08/19/1926  [id number]  Primary Physician Helen Hashimoto., MD Primary Cardiologist: Lorretta Harp MD [name]  HPI:  The patient is a delightful 82 year old thin appearing married Caucasian male who I last saw in the office 06/02/15. He has a history of CAD status post stenting of his LAD, RCA, and OM branches in [place], [place]. He has had bilateral carotid endarterectomies at [facility] in [place] and bilateral carotid stenting in [place], [place]. I last saw him in the office 11/20/15. His other problems include hypertension, hyperlipidemia, diabetes and chronic renal insufficiency. He had critical aortic stenosis with chest pain and dyspnea. Because of worsening carotid Dopplers I angiogrammed him May 18, 2012 revealing moderate in-stent restenosis within the right carotid stent. He ultimately underwent aortic valve replacement with an [place] TFX bioprosthesis as well as SVG to RCA by Dr. Ceasar Mons June 14, 2012. His followup 2D echo was normal. Tomorrow is his last day of cardiac rehab and he has recuperated nicely. His creatinines have remained stable. His other problems include hypertension, hyperlipidemia, and non-insulin-requiring diabetes. His most recent carotid Doppler performed in February revealed stable velocities on the right correlating to 50-60% in-stent restenosis. Since I saw him 12 months ago he he remains completely stable. He denies chest pain or shortness of breath.he had a recent 2-D echo showed normal systolic function with a well functioning aortic bioprosthesis. Recent carotid Doppler showed progression of disease in his right carotid stent probably in the 80-90% range. He is on aspirin Plavix and remains neurologically asymptomatic. His major complaint is of pain in his right second toe with a nonhealing ulcer probably related to PAD. His serum creatinines are  elevated making intervention somewhat problematic.he has been seeing the wound care center has been getting hyperbaric therapy. His wound is slowly healing. He also has had some bursitis and cellulitis in the right leg and is getting physical therapy.Collin Pearson was admitted to [facility] 02/03/15 for approximate one week because of profound bradycardia and hypotension. This was thought to be secondary to amiodarone and beta blocker therapy both which were just discontinued. He did have worsening of his chronic renal insufficiency which improved over time back to her baseline of approximately 2. Since I saw him in the office a month ago he's gained 3-4 pounds. He does admit to dietary indiscretion regarding salt. We talked about the importance of salt restriction. Since I saw him in the office 11/20/15 he had developed critical limb ischemia in his left leg and had a left above-the-knee amputation performed by Dr. Donnetta Hutching 02/08/16. His stump has since healed. He did have physical therapy postoperatively. He has been fitted with a prosthesis.    Current Outpatient Prescriptions  Medication Sig Dispense Refill  . amLODipine (NORVASC) 10 MG tablet TAKE 1 TABLET DAILY 90 tablet 2  . aspirin EC 81 MG tablet Take 81 mg by mouth 2 (two) times daily.    Marland Kitchen atorvastatin (LIPITOR) 40 MG tablet TAKE 1 TABLET DAILY AT BEDTIME 90 tablet 1  . BD INSULIN SYRINGE ULTRAFINE 31G X 5/16" 1 ML MISC Reported on 02/02/2016    . clotrimazole-betamethasone (LOTRISONE) cream Apply 1 application topically daily as needed (dry skin, itching). Reported on 02/02/2016    . fish oil-omega-3 fatty acids 1000 MG capsule Take 1 g by mouth 3 (three) times daily. Reported on 02/02/2016    . furosemide (LASIX) 80 MG tablet TAKE ONE  TABLET DAILY. TAKE AN ADDITIONAL ONE-HALF (1/2) TABLET EVERY OTHER DAY IN THE EVENING IF WEIGHT GOES ABOVE 134 POUNDS 135 tablet 3  . gabapentin (NEURONTIN) 100 MG capsule Take 1 capsule (100 mg total) by mouth  at bedtime. 90 capsule 8  . glipiZIDE (GLUCOTROL) 5 MG tablet Take 5 mg by mouth.    . hydrALAZINE (APRESOLINE) 100 MG tablet Take 1 tablet (100 mg total) by mouth every 8 (eight) hours. 90 tablet 0  . insulin detemir (LEVEMIR) 100 UNIT/ML injection Inject 0.07 mLs (7 Units total) into the skin at bedtime. 10 mL 11  . Iron TABS Take 160 mg by mouth 2 (two) times daily.     . isosorbide mononitrate (IMDUR) 60 MG 24 hr tablet Take 1 tablet (60 mg total) by mouth daily. 30 tablet 1  . Multiple Vitamin (MULTIVITAMIN WITH MINERALS) TABS Take 1 tablet by mouth daily.    . ONE TOUCH ULTRA TEST test strip Reported on 02/02/2016    . oxyCODONE (OXY IR/ROXICODONE) 5 MG immediate release tablet Take 1 tablet (5 mg total) by mouth every 6 (six) hours as needed for severe pain. 14 tablet 0  . pantoprazole (PROTONIX) 40 MG tablet Take 40 mg by mouth daily.    Marland Kitchen senna-docusate (SENOKOT-S) 8.6-50 MG tablet Take 1 tablet by mouth at bedtime. 30 tablet 0  . Tamsulosin HCl (FLOMAX) 0.4 MG CAPS Take 0.4 mg by mouth. One tablet in the morning and 1/2 tablet in the evening    . Vitamin D, Ergocalciferol, (DRISDOL) 50000 units CAPS capsule Take 50,000 Units by mouth every 7 (seven) days. Reported on 02/02/2016    . vitamin E (VITAMIN E) 400 UNIT capsule Take 400 Units by mouth daily. Reported on 02/02/2016     No current facility-administered medications for this visit.     No Known Allergies  Social History   Social History  . Marital status: Married    Spouse name: N/A  . Number of children: N/A  . Years of education: N/A   Occupational History  . Not on file.   Social History Main Topics  . Smoking status: Never Smoker  . Smokeless tobacco: Never Used  . Alcohol use No  . Drug use: No  . Sexual activity: Not Currently   Other Topics Concern  . Not on file   Social History Narrative  . No narrative on file     Review of Systems: General: negative for chills, fever, night sweats or weight  changes.  Cardiovascular: negative for chest pain, dyspnea on exertion, edema, orthopnea, palpitations, paroxysmal nocturnal dyspnea or shortness of breath Dermatological: negative for rash Respiratory: negative for cough or wheezing Urologic: negative for hematuria Abdominal: negative for nausea, vomiting, diarrhea, bright red blood per rectum, melena, or hematemesis Neurologic: negative for visual changes, syncope, or dizziness All other systems reviewed and are otherwise negative except as noted above.    Blood pressure (!) 132/54, pulse 70, height 5' 5.5" (1.664 m), weight 136 lb (61.7 kg).  General appearance: alert and no distress Neck: no adenopathy, no JVD, supple, symmetrical, trachea midline, thyroid not enlarged, symmetric, no tenderness/mass/nodules and Loud bilateral carotid bruits Lungs: clear to auscultation bilaterally Heart: regular rate and rhythm, S1, S2 normal, no murmur, click, rub or gallop Extremities: extremities normal, atraumatic, no cyanosis or edema and Left above-the-knee amputation  EKG sinus rhythm at 70 with low limb voltage and right bundle branch block. I personally reviewed this EKG  ASSESSMENT AND PLAN:   PVD,  with hx of bilateral CEA at [facility], and previous bilateral carotid stenting, with high grade ISR of Rt. carotid 05/18/12 History of bilateral carotid endarterectomies and bilateral carotid stenting. Most recent carotid Dopplers performed 12/04/15 showed moderately severe right ICA stenosis with moderate left ICA stenosis suggesting "in-stent restenosis.  HTN (hypertension) History of hypertension blood pressure measures 125/64. He is on hydralazine and amlodipine. Continue current meds. Doesn't  Dyslipidemia History of dyslipidemia on atorvastatin followed by his PCP  S/P CABG x 1 with tissue AVR 06/13/12 History of coronary artery disease status post bypass grafting X 1 with a vein graft to the RCA at the time of aortic valve replacement with  [employer] bioprosthesis performed by Dr. Servando Snare 06/14/12. He denies chest pain or shortness of breath.  S/P AVR History of aVR for critical aortic stenosis performed by Dr. Pia Mau at the time of bypass grafting 06/14/12 with an [place] 3300 TFX bioprosthesis. The rest of the echo performed 02/04/15 revealed normal LV function with a well functioning aortic bioprosthesis.  PAD (peripheral artery disease) (HCC) History of left lower extremity critical limb ischemia status post left above-the-knee amputation by Dr. Donnetta Hutching 02/08/16. The stump healed nicely. Did have OT and PT afterwards and has been fitted with a prosthesis.      Lorretta Harp MD FACP,FACC,FAHA, [facility] 05/20/2016 3:46 PM

## 2016-05-20 NOTE — Assessment & Plan Note (Signed)
History of left lower extremity critical limb ischemia status post left above-the-knee amputation by Dr. Donnetta Hutching 02/08/16. The stump healed nicely. Did have OT and PT afterwards and has been fitted with a prosthesis.

## 2016-05-20 NOTE — Assessment & Plan Note (Signed)
History of bilateral carotid endarterectomies and bilateral carotid stenting. Most recent carotid Dopplers performed 12/04/15 showed moderately severe right ICA stenosis with moderate left ICA stenosis suggesting "in-stent restenosis.

## 2016-05-20 NOTE — Assessment & Plan Note (Signed)
History of coronary artery disease status post bypass grafting X 1 with a vein graft to the RCA at the time of aortic valve replacement with [employer] bioprosthesis performed by Dr. Servando Snare 06/14/12. He denies chest pain or shortness of breath.

## 2016-05-20 NOTE — Assessment & Plan Note (Signed)
History of dyslipidemia on atorvastatin followed by his PCP

## 2016-05-20 NOTE — Assessment & Plan Note (Signed)
History of aVR for critical aortic stenosis performed by Dr. Pia Mau at the time of bypass grafting 06/14/12 with an [place] 3300 TFX bioprosthesis. The rest of the echo performed 02/04/15 revealed normal LV function with a well functioning aortic bioprosthesis.

## 2016-05-20 NOTE — Assessment & Plan Note (Signed)
History of hypertension blood pressure measures 125/64. He is on hydralazine and amlodipine. Continue current meds. Doesn't

## 2016-07-27 ENCOUNTER — Other Ambulatory Visit: Payer: Self-pay | Admitting: Cardiovascular Disease

## 2016-09-06 ENCOUNTER — Other Ambulatory Visit: Payer: Self-pay | Admitting: Cardiovascular Disease

## 2016-09-06 NOTE — Telephone Encounter (Signed)
REFILL 

## 2016-10-07 ENCOUNTER — Telehealth: Payer: Self-pay | Admitting: Cardiovascular Disease

## 2016-10-07 MED ORDER — ISOSORBIDE MONONITRATE ER 60 MG PO TB24: 60.0000 mg | ORAL_TABLET | Freq: Every day | ORAL | 1 refills | Status: DC

## 2016-10-07 NOTE — Telephone Encounter (Signed)
New Message   *STAT* If patient is at the pharmacy, call can be transferred to refill team.   1. Which medications need to be refilled? (please list name of each medication and dose if known) Isosorbide 16.mg   2. Which pharmacy/location (including street and city if local pharmacy) is medication to be sent to? [facility] [place]  3. Do they need a 30 day or 90 day supply?

## 2016-10-11 ENCOUNTER — Encounter: Payer: Self-pay | Admitting: Vascular Surgery

## 2016-10-17 ENCOUNTER — Encounter: Payer: Self-pay | Admitting: Vascular Surgery

## 2016-10-18 ENCOUNTER — Ambulatory Visit (INDEPENDENT_AMBULATORY_CARE_PROVIDER_SITE_OTHER): Payer: BLUE CROSS/BLUE SHIELD | Admitting: Vascular Surgery

## 2016-10-18 ENCOUNTER — Encounter: Payer: Self-pay | Admitting: Vascular Surgery

## 2016-10-18 VITALS — BP 136/65 | HR 66 | Temp 97.8°F | Resp 16 | Ht 65.5 in | Wt 130.0 lb

## 2016-10-18 DIAGNOSIS — I998 Other disorder of circulatory system: Secondary | ICD-10-CM

## 2016-10-18 DIAGNOSIS — I70229 Atherosclerosis of native arteries of extremities with rest pain, unspecified extremity: Secondary | ICD-10-CM

## 2016-10-18 NOTE — Progress Notes (Signed)
[facility] [place]  Patient name: Collin Pearson MRN: [id number] DOB: [birth year] Sex: male  REASON FOR VISIT: Evaluation of right foot for possible critical ischemia  HPI: Collin Pearson is a 83 y.o. male known to me from prior left leg amputation. He had presented proximal 9 months ago with nonviable left foot critical limb ischemia. He underwent uneventful above-knee amputation. He is here today with his family. There was concern regarding his right foot. He has some hemosiderin deposits on the dorsum of his foot but no open ulceration. Family is obviously concerned regarding the outcome of his left foot. He is in a wheelchair today. He reports that he has developed a contracture in his right knee while waiting on his prosthesis for his left leg. Is continuing to occasion work with physical therapy. He reports that he is able to transfer independently from his bed to wheelchair. Is able to stand with some assistance despite the contracture  Past Medical History:  Diagnosis Date  . Anginal pain ([place])   . Anxiety   . Arthritis   . AS (aortic stenosis)    status post aortic valve replacement with an [employer] model 24 T. fracture bioprosthesis  . CKD (chronic kidney disease) stage 4, GFR 15-29 ml/min (HCC)   . Clotting disorder ([place])   . Coronary artery disease   . Critical lower limb ischemia   . Diabetes mellitus   . GERD (gastroesophageal reflux disease)   . Heart murmur   . Hypertension   . PAF (paroxysmal atrial fibrillation), s/p cabg 06/18/2012  . Peripheral vascular disease ([place])   . PVD (peripheral vascular disease), with hsx of bilateral carotid endarterectomies at [facility], and bilateral carotid stenting 05/18/2012    Family History  Problem Relation Age of Onset  . Heart disease Mother 52  . Heart disease Father 29  . CAD Brother   . Heart disease Maternal Uncle   . Heart disease Paternal Uncle    . Heart disease Maternal Grandfather 68    SOCIAL HISTORY: Social History  Substance Use Topics  . Smoking status: Never Smoker  . Smokeless tobacco: Never Used  . Alcohol use No    No Known Allergies  Current Outpatient Prescriptions  Medication Sig Dispense Refill  . amLODipine (NORVASC) 10 MG tablet TAKE 1 TABLET DAILY 90 tablet 1  . aspirin EC 81 MG tablet Take 81 mg by mouth 2 (two) times daily.    Marland Kitchen atorvastatin (LIPITOR) 40 MG tablet TAKE 1 TABLET DAILY AT BEDTIME 90 tablet 1  . BD INSULIN SYRINGE ULTRAFINE 31G X 5/16" 1 ML MISC Reported on 02/02/2016    . clotrimazole-betamethasone (LOTRISONE) cream Apply 1 application topically daily as needed (dry skin, itching). Reported on 02/02/2016    . fish oil-omega-3 fatty acids 1000 MG capsule Take 1 g by mouth 3 (three) times daily. Reported on 02/02/2016    . furosemide (LASIX) 80 MG tablet TAKE ONE TABLET DAILY. TAKE AN ADDITIONAL ONE-HALF (1/2) TABLET EVERY OTHER DAY IN THE EVENING IF WEIGHT GOES ABOVE 134 POUNDS 135 tablet 3  . gabapentin (NEURONTIN) 100 MG capsule Take 1 capsule (100 mg total) by mouth at bedtime. 90 capsule 8  . glipiZIDE (GLUCOTROL) 5 MG tablet Take 5 mg by mouth.    . hydrALAZINE (APRESOLINE) 100 MG tablet Take 1 tablet (100 mg total) by mouth every 8 (eight) hours. 90 tablet 0  . insulin detemir (LEVEMIR) 100 UNIT/ML injection Inject 0.07 mLs (  7 Units total) into the skin at bedtime. 10 mL 11  . Iron TABS Take 160 mg by mouth 2 (two) times daily.     . isosorbide mononitrate (IMDUR) 60 MG 24 hr tablet Take 1 tablet (60 mg total) by mouth daily. 30 tablet 1  . Multiple Vitamin (MULTIVITAMIN WITH MINERALS) TABS Take 1 tablet by mouth daily.    . ONE TOUCH ULTRA TEST test strip Reported on 02/02/2016    . oxyCODONE (OXY IR/ROXICODONE) 5 MG immediate release tablet Take 1 tablet (5 mg total) by mouth every 6 (six) hours as needed for severe pain. 14 tablet 0  . pantoprazole (PROTONIX) 40 MG tablet Take 40 mg by  mouth daily.    Marland Kitchen senna-docusate (SENOKOT-S) 8.6-50 MG tablet Take 1 tablet by mouth at bedtime. 30 tablet 0  . Tamsulosin HCl (FLOMAX) 0.4 MG CAPS Take 0.4 mg by mouth. One tablet in the morning and 1/2 tablet in the evening    . Vitamin D, Ergocalciferol, (DRISDOL) 50000 units CAPS capsule Take 50,000 Units by mouth every 7 (seven) days. Reported on 02/02/2016    . vitamin E (VITAMIN E) 400 UNIT capsule Take 400 Units by mouth daily. Reported on 02/02/2016     No current facility-administered medications for this visit.     REVIEW OF SYSTEMS:  [X]  denotes positive finding, [ ]  denotes negative finding Cardiac  Comments:  Chest pain or chest pressure:    Shortness of breath upon exertion:    Short of breath when lying flat:    Irregular heart rhythm:        Vascular    Pain in calf, thigh, or hip brought on by ambulation:    Pain in feet at night that wakes you up from your sleep:     Blood clot in your veins:    Leg swelling:           PHYSICAL EXAM: Vitals:   10/18/16 1508  BP: 136/65  Pulse: 66  Resp: 16  Temp: 97.8 F (36.6 C)  TempSrc: Oral  SpO2: 94%  Weight: 130 lb (59 kg)  Height: 5' 5.5" (1.664 m)    GENERAL: The patient is a well-nourished male, in no acute distress. The vital signs are documented above. CARDIOVASCULAR: No palpable right popliteal or pedal pulses. PULMONARY: There is good air exchange  MUSCULOSKELETAL: There are no major deformities or cyanosis. NEUROLOGIC: No focal weakness or paresthesias are detected. SKIN: There are no ulcers or rashes noted. Does have some hemosiderin deposits on the dorsum of his foot. He has a Band-Aid over prominent area over his lateral fifth metatarsal head. His great toe overlaps the second toe and keeps a Band-Aid there to protect this as well with no open ulcer present. PSYCHIATRIC: The patient has a normal affect.   MEDICAL ISSUES: Stable overall. Prior examinations shown severe tibial disease. He reports that  he is stable from a renal standpoint. I do not see any evidence of critical ischemia. He will continue with follow-up with [name] on an as-needed basis. He is asking specifically regarding driving a car. I told him I did not feel that this is safe due to multiple factors none individual specifically. Family was reassured because they certainly do not feel he is appropriate to drive. We will see him again as needed    Rosetta Posner, MD [facility] Tel [phone] Pager [phone]

## 2016-11-15 ENCOUNTER — Other Ambulatory Visit: Payer: Self-pay | Admitting: *Deleted

## 2016-11-15 MED ORDER — AMLODIPINE BESYLATE 10 MG PO TABS: 10.0000 mg | ORAL_TABLET | Freq: Every day | ORAL | 1 refills | Status: DC

## 2016-11-15 MED ORDER — ISOSORBIDE MONONITRATE ER 60 MG PO TB24: 60.0000 mg | ORAL_TABLET | Freq: Every day | ORAL | 1 refills | Status: AC

## 2016-12-06 ENCOUNTER — Ambulatory Visit (HOSPITAL_COMMUNITY)
Admission: RE | Admit: 2016-12-06 | Discharge: 2016-12-06 | Disposition: A | Discharge status: Home / Self Care | Payer: BLUE CROSS/BLUE SHIELD | Source: Ambulatory Visit | Attending: Cardiovascular Disease | Admitting: Cardiovascular Disease

## 2016-12-06 ENCOUNTER — Other Ambulatory Visit: Payer: Self-pay | Admitting: Cardiovascular Disease

## 2016-12-06 DIAGNOSIS — I6523 Occlusion and stenosis of bilateral carotid arteries: Secondary | ICD-10-CM | POA: Diagnosis not present

## 2016-12-06 DIAGNOSIS — I720 Aneurysm of carotid artery: Secondary | ICD-10-CM

## 2016-12-08 ENCOUNTER — Other Ambulatory Visit: Payer: Self-pay

## 2016-12-08 DIAGNOSIS — I6529 Occlusion and stenosis of unspecified carotid artery: Secondary | ICD-10-CM

## 2016-12-16 ENCOUNTER — Encounter: Payer: Self-pay | Admitting: Cardiovascular Disease

## 2016-12-16 ENCOUNTER — Ambulatory Visit (INDEPENDENT_AMBULATORY_CARE_PROVIDER_SITE_OTHER): Payer: BLUE CROSS/BLUE SHIELD | Admitting: Cardiovascular Disease

## 2016-12-16 DIAGNOSIS — I350 Nonrheumatic aortic (valve) stenosis: Secondary | ICD-10-CM

## 2016-12-16 DIAGNOSIS — E785 Hyperlipidemia, unspecified: Secondary | ICD-10-CM | POA: Diagnosis not present

## 2016-12-16 DIAGNOSIS — I739 Peripheral vascular disease, unspecified: Secondary | ICD-10-CM | POA: Diagnosis not present

## 2016-12-16 DIAGNOSIS — I2510 Atherosclerotic heart disease of native coronary artery without angina pectoris: Secondary | ICD-10-CM

## 2016-12-16 DIAGNOSIS — I10 Essential (primary) hypertension: Secondary | ICD-10-CM | POA: Diagnosis not present

## 2016-12-16 NOTE — Assessment & Plan Note (Signed)
History of aortic stenosis status post aortic valve replacement by Dr. Servando Snare 05/18/12 with an [employer]as [employer] bioprosthesis. We continue to follow this by 2-D echocardiogram

## 2016-12-16 NOTE — Assessment & Plan Note (Signed)
History of hypertension blood pressure121/60. He is on amlodipine and hydralazine. Continue current meds at current dosing

## 2016-12-16 NOTE — Assessment & Plan Note (Signed)
History of hyperlipidemia on atorvastatin followed by his PCP 

## 2016-12-16 NOTE — Assessment & Plan Note (Signed)
History of peripheral arterial disease status post left above-the-knee amputation by Dr. Donnetta Hutching 02/08/16 for critical limb ischemia. He has been fitted with a prosthesis which she wears at home primarily he is wheelchair-bound.

## 2016-12-16 NOTE — Progress Notes (Signed)
12/16/2016 Collin Pearson   1927/06/27  [id number]  Primary Physician Helen Hashimoto., MD Primary Cardiologist: Lorretta Harp MD [name]  HPI:  The patient is a delightful 83 year old thin appearing married Caucasian male who I last saw in the office 05/20/16. He is accompanied by his wife Collin Pearson today. He has a history of CAD status post stenting of his LAD, RCA, and OM branches in [place], [place]. He has had bilateral carotid endarterectomies at [facility] in [place] and bilateral carotid stenting in [place], [place]. I last saw him in the office 11/20/15. His other problems include hypertension, hyperlipidemia, diabetes and chronic renal insufficiency. He had critical aortic stenosis with chest pain and dyspnea. Because of worsening carotid Dopplers I angiogrammed him May 18, 2012 revealing moderate in-stent restenosis within the right carotid stent. He ultimately underwent aortic valve replacement with an [place] TFX bioprosthesis as well as SVG to RCA by Dr. Ceasar Mons June 14, 2012. His followup 2D echo was normal. Tomorrow is his last day of cardiac rehab and he has recuperated nicely. His creatinines have remained stable. His other problems include hypertension, hyperlipidemia, and non-insulin-requiring diabetes. His most recent carotid Doppler performed in February revealed stable velocities on the right correlating to 50-60% in-stent restenosis. Since I saw him 12 months ago he he remains completely stable. He denies chest pain or shortness of breath.he had a recent 2-D echo showed normal systolic function with a well functioning aortic bioprosthesis. Recent carotid Doppler showed progression of disease in his right carotid stent probably in the 80-90% range. He is on aspirin Plavix and remains neurologically asymptomatic. His major complaint is of pain in his right second toe with a nonhealing ulcer probably related  to PAD. His serum creatinines are elevated making intervention somewhat problematic.he has been seeing the wound care center has been getting hyperbaric therapy. His wound is slowly healing. He also has had some bursitis and cellulitis in the right leg and is getting physical therapy.Mr. Farrington was admitted to [facility] 02/03/15 for approximate one week because of profound bradycardia and hypotension. This was thought to be secondary to amiodarone and beta blocker therapy both which were just discontinued. He did have worsening of his chronic renal insufficiency which improved over time back to her baseline of approximately 2. Since I saw him in the office a month ago he's gained 3-4 pounds. He does admit to dietary indiscretion regarding salt. We talked about the importance of salt restriction. Since I saw him in the office 11/20/15 he had developed critical limb ischemia in his left leg and had a left above-the-knee amputation performed by Dr. Donnetta Hutching 02/08/16. His stump has since healed. He did have physical therapy postoperatively. He has been fitted with a prosthesis which he wears at home but primarily he is wheelchair-bound. He denies chest pain or shortness of breath. Recent carotid Dopplers performed 12/06/16 revealed stable right internal carotid "in-stent restenosis".   Current Outpatient Prescriptions  Medication Sig Dispense Refill  . amLODipine (NORVASC) 10 MG tablet Take 1 tablet (10 mg total) by mouth daily. 90 tablet 1  . aspirin EC 81 MG tablet Take 81 mg by mouth 2 (two) times daily.    Collin Pearson atorvastatin (LIPITOR) 40 MG tablet TAKE 1 TABLET DAILY AT BEDTIME 90 tablet 1  . BD INSULIN SYRINGE ULTRAFINE 31G X 5/16" 1 ML MISC Reported on 02/02/2016    . clotrimazole-betamethasone (LOTRISONE) cream Apply 1 application topically daily  as needed (dry skin, itching). Reported on 02/02/2016    . fish oil-omega-3 fatty acids 1000 MG capsule Take 1 g by mouth 3 (three) times daily. Reported on  02/02/2016    . furosemide (LASIX) 80 MG tablet TAKE ONE TABLET DAILY. TAKE AN ADDITIONAL ONE-HALF (1/2) TABLET EVERY OTHER DAY IN THE EVENING IF WEIGHT GOES ABOVE 134 POUNDS 135 tablet 3  . gabapentin (NEURONTIN) 100 MG capsule Take 1 capsule (100 mg total) by mouth at bedtime. (Patient taking differently: Take 300 mg by mouth at bedtime. ) 90 capsule 8  . gabapentin (NEURONTIN) 300 MG capsule Take 1 capsule by mouth daily at 10 pm.    . glipiZIDE (GLUCOTROL) 5 MG tablet Take 5 mg by mouth 3 (three) times daily.     . hydrALAZINE (APRESOLINE) 100 MG tablet Take 1 tablet (100 mg total) by mouth every 8 (eight) hours. 90 tablet 0  . insulin detemir (LEVEMIR) 100 UNIT/ML injection Inject 0.07 mLs (7 Units total) into the skin at bedtime. 10 mL 11  . Iron TABS Take 160 mg by mouth 2 (two) times daily.     . isosorbide mononitrate (IMDUR) 60 MG 24 hr tablet Take 1 tablet (60 mg total) by mouth daily. 90 tablet 1  . Multiple Vitamin (MULTIVITAMIN WITH MINERALS) TABS Take 1 tablet by mouth daily.    . ONE TOUCH ULTRA TEST test strip Reported on 02/02/2016    . pantoprazole (PROTONIX) 40 MG tablet Take 40 mg by mouth daily.    Collin Pearson senna-docusate (SENOKOT-S) 8.6-50 MG tablet Take 1 tablet by mouth at bedtime. 30 tablet 0  . Tamsulosin HCl (FLOMAX) 0.4 MG CAPS Take 0.4 mg by mouth. One tablet in the morning and 1/2 tablet in the evening    . Vitamin D, Ergocalciferol, (DRISDOL) 50000 units CAPS capsule Take 50,000 Units by mouth every 7 (seven) days. Reported on 02/02/2016    . vitamin E (VITAMIN E) 400 UNIT capsule Take 400 Units by mouth daily. Reported on 02/02/2016     No current facility-administered medications for this visit.     No Known Allergies  Social History   Social History  . Marital status: Married    Spouse name: N/A  . Number of children: N/A  . Years of education: N/A   Occupational History  . Not on file.   Social History Main Topics  . Smoking status: Never Smoker  .  Smokeless tobacco: Never Used  . Alcohol use No  . Drug use: No  . Sexual activity: Not Currently   Other Topics Concern  . Not on file   Social History Narrative  . No narrative on file     Review of Systems: General: negative for chills, fever, night sweats or weight changes.  Cardiovascular: negative for chest pain, dyspnea on exertion, edema, orthopnea, palpitations, paroxysmal nocturnal dyspnea or shortness of breath Dermatological: negative for rash Respiratory: negative for cough or wheezing Urologic: negative for hematuria Abdominal: negative for nausea, vomiting, diarrhea, bright red blood per rectum, melena, or hematemesis Neurologic: negative for visual changes, syncope, or dizziness All other systems reviewed and are otherwise negative except as noted above.    Blood pressure 121/60, pulse 71, height 5' 5.5" (1.664 m), weight 135 lb (61.2 kg), SpO2 98 %.  General appearance: alert and no distress Neck: no adenopathy, no JVD, supple, symmetrical, trachea midline, thyroid not enlarged, symmetric, no tenderness/mass/nodules and Bilateral carotid bruits Lungs: clear to auscultation bilaterally Heart: 2/6 outflow tract murmur consistent  with aortic stenosis Extremities: Left above-the-knee amputation  EKG sinus rhythm at 71 with right bundle branch block and small inferior Q waves. I personally reviewed this EKG  ASSESSMENT AND PLAN:   PVD, with hx of bilateral CEA at [facility], and previous bilateral carotid stenting, with high grade ISR of Rt. carotid 05/18/12 History of carotid artery disease status post bilateral carotid endarterectomies at [facility] in [place] and subsequent bilateral carotid stenting in [place]. Angiogram him 05/18/12 revealing moderate coronary in-stent restenosis" in the right carotid stent negative we continue to follow him by duplex ultrasound and this has remained stable.  HTN (hypertension) History of hypertension blood  pressure121/60. He is on amlodipine and hydralazine. Continue current meds at current dosing  Dyslipidemia History of hyperlipidemia on atorvastatin followed by his PCP  CAD, patent stent to LAD and mid RCA with occluded OM2 branch   History of CAD status post RCA bypass grafting by Dr. Servando Snare 06/14/12 at the time of aortic valve replacement. He denies chest pain.  AS (aortic stenosis), critical stenosis History of aortic stenosis status post aortic valve replacement by Dr. Servando Snare 05/18/12 with an [employer]let[employer] Lamas life science bioprosthesis. We continue to follow this by 2-D echocardiogram  Peripheral arterial disease History of peripheral arterial disease status post left above-the-knee amputation by Dr. Donnetta Hutching 02/08/16 for critical limb ischemia. He has been fitted with a prosthesis which she wears at home primarily he is wheelchair-bound.      Lorretta Harp MD [place], [facility] 12/16/2016 3:03 PM

## 2016-12-16 NOTE — Patient Instructions (Addendum)
Medication Instructions: Your physician recommends that you continue on your current medications as directed. Please refer to the Current Medication list given to you today.   Testing/Procedures: Schedule Carotid duplex for May 2019.  Your physician has requested that you have an echocardiogram. Echocardiography is a painless test that uses sound waves to create images of your heart. It provides your doctor with information about the size and shape of your heart and how well your heart's chambers and valves are working. This procedure takes approximately one hour. There are no restrictions for this procedure.  Follow-Up: Your physician wants you to follow-up in: 1 year with Dr. Gwenlyn Found after Carotid duplex. You will receive a reminder letter in the mail two months in advance. If you don't receive a letter, please call our office to schedule the follow-up appointment.  If you need a refill on your cardiac medications before your next appointment, please call your pharmacy.

## 2016-12-16 NOTE — Assessment & Plan Note (Signed)
History of CAD status post RCA bypass grafting by Dr. Servando Snare 06/14/12 at the time of aortic valve replacement. He denies chest pain.

## 2016-12-16 NOTE — Assessment & Plan Note (Signed)
History of carotid artery disease status post bilateral carotid endarterectomies at [facility] in [place] and subsequent bilateral carotid stenting in [place]. Angiogram him 05/18/12 revealing moderate coronary in-stent restenosis" in the right carotid stent negative we continue to follow him by duplex ultrasound and this has remained stable.

## 2016-12-30 ENCOUNTER — Ambulatory Visit (HOSPITAL_COMMUNITY): Payer: BLUE CROSS/BLUE SHIELD | Attending: Cardiology

## 2016-12-30 ENCOUNTER — Other Ambulatory Visit: Payer: Self-pay

## 2016-12-30 DIAGNOSIS — I350 Nonrheumatic aortic (valve) stenosis: Secondary | ICD-10-CM | POA: Insufficient documentation

## 2017-01-04 ENCOUNTER — Other Ambulatory Visit: Payer: Self-pay | Admitting: Cardiovascular Disease

## 2017-01-04 DIAGNOSIS — I350 Nonrheumatic aortic (valve) stenosis: Secondary | ICD-10-CM

## 2017-01-11 ENCOUNTER — Ambulatory Visit: Payer: Medicare Other | Admitting: Cardiovascular Disease

## 2017-01-31 ENCOUNTER — Telehealth: Payer: Self-pay | Admitting: *Deleted

## 2017-01-31 NOTE — Telephone Encounter (Signed)
Spoke to pt's wife to sch appt 02/03/17 at 9:15.

## 2017-01-31 NOTE — Telephone Encounter (Signed)
Per [name], Mr Collin Pearson is having increased pain in his right foot and calf. Patient has left AKA (02-08-16) His last ABI was done at [facility] in April 2017, read by Dr. Johnsie Cancel as noncompressible vessels. The patient is also a diabetic (sees Dr. Elyse Hsu) but [name] states that he does not have any ulcers that she is aware of. She does say that he has the same brown area on the top of his foot; according to Dr. Luther Parody last note, this was hemosiderin deposits.  We will make him an appt to see Dr. Donnetta Hutching about this.

## 2017-02-02 ENCOUNTER — Ambulatory Visit: Payer: Self-pay | Admitting: Family

## 2017-02-03 ENCOUNTER — Encounter: Payer: Self-pay | Admitting: Family

## 2017-02-03 ENCOUNTER — Ambulatory Visit (HOSPITAL_COMMUNITY)
Admission: RE | Admit: 2017-02-03 | Discharge: 2017-02-03 | Disposition: A | Discharge status: Home / Self Care | Payer: BLUE CROSS/BLUE SHIELD | Source: Ambulatory Visit | Attending: Vascular Surgery | Admitting: Vascular Surgery

## 2017-02-03 ENCOUNTER — Ambulatory Visit (INDEPENDENT_AMBULATORY_CARE_PROVIDER_SITE_OTHER): Payer: BLUE CROSS/BLUE SHIELD | Admitting: Family

## 2017-02-03 ENCOUNTER — Ambulatory Visit (INDEPENDENT_AMBULATORY_CARE_PROVIDER_SITE_OTHER)
Admission: RE | Admit: 2017-02-03 | Discharge: 2017-02-03 | Disposition: A | Discharge status: Home / Self Care | Payer: BLUE CROSS/BLUE SHIELD | Source: Ambulatory Visit | Attending: Vascular Surgery | Admitting: Vascular Surgery

## 2017-02-03 ENCOUNTER — Other Ambulatory Visit: Payer: Self-pay

## 2017-02-03 VITALS — BP 141/64 | HR 74 | Temp 97.2°F | Resp 16 | Ht 65.5 in | Wt 135.0 lb

## 2017-02-03 DIAGNOSIS — E1151 Type 2 diabetes mellitus with diabetic peripheral angiopathy without gangrene: Secondary | ICD-10-CM | POA: Diagnosis not present

## 2017-02-03 DIAGNOSIS — Z89612 Acquired absence of left leg above knee: Secondary | ICD-10-CM

## 2017-02-03 DIAGNOSIS — I70262 Atherosclerosis of native arteries of extremities with gangrene, left leg: Secondary | ICD-10-CM | POA: Insufficient documentation

## 2017-02-03 LAB — VAS US LOWER EXTREMITY ARTERIAL DUPLEX
RATIBDISTSYS: 30 cm/s
RSFPPSV: 266 cm/s
RTIBDISTSYS: 45 cm/s
Right super femoral dist sys PSV: -102 cm/s
Right super femoral mid sys PSV: 85 cm/s

## 2017-02-03 NOTE — Patient Instructions (Signed)

## 2017-02-03 NOTE — Progress Notes (Signed)
[facility] [place]   CC: Follow up peripheral artery occlusive disease  History of Present Illness Collin Pearson is a 83 y.o. male known to Dr. Donnetta Hutching from prior left leg amputation. He had presented in June of 2017 with nonviable left foot critical limb ischemia. He underwent uneventful above-knee amputation.There was concern regarding his right foot. He has some hemosiderin deposits on the dorsum of his foot but no open ulceration. Family is obviously concerned regarding the outcome of his left foot.  He reports that he is able to transfer independently from his bed to wheelchair. Is able to stand with some assistance despite the right knee contracture.  Dr. Donnetta Hutching last evaluated pt on 10-18-16. At that time Dr. Donnetta Hutching indicated that prior examinations showed severe tibial disease. Pt was stable from a renal standpoint.  Dr. Donnetta Hutching did not see any evidence of critical ischemia. He is asking specifically regarding driving a car. Dr. Donnetta Hutching told him that he did not feel that this is safe due to multiple factors none individual specifically. Family was reassured because they certainly do not feel he is appropriate to drive. Dr. Donnetta Hutching advised that pt follow up as needed.  Pt and wife, Collin Pearson, state Mr Collin Pearson is having increased pain in his right foot and calf for the last 2 years, worse in the last 4 months. Patient has left AKA (02-08-16) His last ABI was done at [facility] in April 2017, read by Dr. Johnsie Cancel as noncompressible vessels. The patient is also a diabetic (sees Dr. Elyse Hsu). She does say that he has the same brown area on the top of his foot; according to Dr. Luther Parody last note, this was hemosiderin deposits. He has no open wounds on his right foot or leg, no gangrene.  He was treated at [facility] wound care center, hyperbaric chamber, for right foot wounds which have healed.   He has had carotid artery stenosis addressed with stenting and or CEA bilaterally at other  cities, possibly once addressed by Dr. Gwenlyn Found according to pt and wife.   Pt Diabetic: Yes, no A1C result on file within the last year, wife and pt do not know recent A1C, states his home glucose checks are about 200, 132-300. Pt smoker: non-smoker  Pt meds include: Statin :Yes Betablocker: No ASA: Yes Other anticoagulants/antiplatelets: no  Past Medical History:  Diagnosis Date  . Anginal pain ([place])   . Anxiety   . Arthritis   . AS (aortic stenosis)    status post aortic valve replacement with an [employer] model 10 T. fracture bioprosthesis  . CKD (chronic kidney disease) stage 4, GFR 15-29 ml/min (HCC)   . Clotting disorder ([place])   . Coronary artery disease   . Critical lower limb ischemia   . Diabetes mellitus   . GERD (gastroesophageal reflux disease)   . Heart murmur   . Hypertension   . PAF (paroxysmal atrial fibrillation), s/p cabg 06/18/2012  . Peripheral vascular disease ([place])   . PVD (peripheral vascular disease), with hsx of bilateral carotid endarterectomies at [facility], and bilateral carotid stenting 05/18/2012    Social History Social History  Substance Use Topics  . Smoking status: Never Smoker  . Smokeless tobacco: Never Used  . Alcohol use No    Family History Family History  Problem Relation Age of Onset  . Heart disease Mother 65  . Heart disease Father 31  . CAD Brother   . Heart disease Maternal Uncle   . Heart disease  Paternal Uncle   . Heart disease Maternal Grandfather 52    Past Surgical History:  Procedure Laterality Date  . ABOVE KNEE LEG AMPUTATION Left 02/08/2016  . AMPUTATION Left 02/08/2016   Procedure: AMPUTATION ABOVE KNEE;  Surgeon: Rosetta Posner, MD;  Location: [facility];  Service: Vascular;  Laterality: Left;  . AORTIC VALVE REPLACEMENT  06/13/2012   Procedure: AORTIC VALVE REPLACEMENT (AVR);  Surgeon: Grace Isaac, MD;  Location: [place];  Service: Open Heart Surgery;  Laterality: N/A;  . CARDIAC CATHETERIZATION     . cardiac stents    . Cardiac Stress Test  06/03/2010   Mild anterolateral ischemis  . CARDIAC VALVE REPLACEMENT    . CAROTID ANGIOGRAM N/A 05/18/2012   Procedure: CAROTID ANGIOGRAM;  Surgeon: Lorretta Harp, MD;  Location: [facility] CATH LAB;  Service: Cardiovascular;  Laterality: N/A;  . CORONARY ARTERY BYPASS GRAFT  06/13/2012   Procedure: CORONARY ARTERY BYPASS GRAFTING (CABG);  Surgeon: Grace Isaac, MD;  Location: [place];  Service: Open Heart Surgery;  Laterality: N/A;  must start at 58 - appointment at 0800  . ENDARTERECTOMY    . LEFT AND RIGHT HEART CATHETERIZATION WITH CORONARY ANGIOGRAM N/A 05/18/2012   Procedure: LEFT AND RIGHT HEART CATHETERIZATION WITH CORONARY ANGIOGRAM;  Surgeon: Lorretta Harp, MD;  Location: [facility] CATH LAB;  Service: Cardiovascular;  Laterality: N/A;  . MULTIPLE EXTRACTIONS WITH ALVEOLOPLASTY N/A 02/23/2016   Procedure:  EXTRACTION OF TOOTH #27 WITH ALVEOLOPLASTY AND GROSS dERIDEMENT OF REMAINING TEETH;  Surgeon: Lenn Cal, DDS;  Location: [place];  Service: Oral Surgery;  Laterality: N/A;  . PROSTATE SURGERY      No Known Allergies  Current Outpatient Prescriptions  Medication Sig Dispense Refill  . amLODipine (NORVASC) 10 MG tablet Take 1 tablet (10 mg total) by mouth daily. 90 tablet 1  . aspirin EC 81 MG tablet Take 81 mg by mouth 2 (two) times daily.    Marland Kitchen atorvastatin (LIPITOR) 40 MG tablet TAKE 1 TABLET DAILY AT BEDTIME 90 tablet 1  . BD INSULIN SYRINGE ULTRAFINE 31G X 5/16" 1 ML MISC Reported on 02/02/2016    . clotrimazole-betamethasone (LOTRISONE) cream Apply 1 application topically daily as needed (dry skin, itching). Reported on 02/02/2016    . fish oil-omega-3 fatty acids 1000 MG capsule Take 1 g by mouth 3 (three) times daily. Reported on 02/02/2016    . furosemide (LASIX) 80 MG tablet TAKE ONE TABLET DAILY. TAKE AN ADDITIONAL ONE-HALF (1/2) TABLET EVERY OTHER DAY IN THE EVENING IF WEIGHT GOES ABOVE 134 POUNDS 135 tablet 3  . gabapentin  (NEURONTIN) 100 MG capsule Take 1 capsule (100 mg total) by mouth at bedtime. (Patient taking differently: Take 300 mg by mouth at bedtime. ) 90 capsule 8  . gabapentin (NEURONTIN) 300 MG capsule Take 1 capsule by mouth daily at 10 pm.    . glipiZIDE (GLUCOTROL) 5 MG tablet Take 5 mg by mouth 3 (three) times daily.     . hydrALAZINE (APRESOLINE) 100 MG tablet Take 1 tablet (100 mg total) by mouth every 8 (eight) hours. 90 tablet 0  . insulin detemir (LEVEMIR) 100 UNIT/ML injection Inject 0.07 mLs (7 Units total) into the skin at bedtime. 10 mL 11  . Iron TABS Take 160 mg by mouth 2 (two) times daily.     . isosorbide mononitrate (IMDUR) 60 MG 24 hr tablet Take 1 tablet (60 mg total) by mouth daily. 90 tablet 1  . Multiple Vitamin (MULTIVITAMIN WITH MINERALS)  TABS Take 1 tablet by mouth daily.    . ONE TOUCH ULTRA TEST test strip Reported on 02/02/2016    . pantoprazole (PROTONIX) 40 MG tablet Take 40 mg by mouth daily.    Marland Kitchen senna-docusate (SENOKOT-S) 8.6-50 MG tablet Take 1 tablet by mouth at bedtime. 30 tablet 0  . Tamsulosin HCl (FLOMAX) 0.4 MG CAPS Take 0.4 mg by mouth. One tablet in the morning and 1/2 tablet in the evening    . Vitamin D, Ergocalciferol, (DRISDOL) 50000 units CAPS capsule Take 50,000 Units by mouth every 7 (seven) days. Reported on 02/02/2016    . vitamin E (VITAMIN E) 400 UNIT capsule Take 400 Units by mouth daily. Reported on 02/02/2016     No current facility-administered medications for this visit.     ROS: See HPI for pertinent positives and negatives.   Physical Examination  Vitals:   02/03/17 0917  BP: (!) 141/64  Pulse: 74  Resp: 16  Temp: 97.2 F (36.2 C)  TempSrc: Oral  SpO2: 97%  Weight: 135 lb (61.2 kg)  Height: 5' 5.5" (1.664 m)   Body mass index is 22.12 kg/m.  General: A&O x 3, WDWN, elderly male. Gait: seated in his w/c from home Eyes: PERRLA. Pulmonary: Respirations are non labored, CTAB, good air movement Cardiac: regular rhythm and rate,  no detected murmur.         Carotid Bruits Right Left   Negative Negative   Radial pulses are 1+ palpable bilaterally   Adominal aortic pulse is not palpable                         VASCULAR EXAM: Extremities without ischemic changes, without Gangrene; without open wounds. Faint demarcation of mild erythema at entire forefoot.                                                                                                           LE Pulses Right Left       FEMORAL   palpable   palpable        POPLITEAL  not palpable   AKA       POSTERIOR TIBIAL  not palpable, + Doppler signal   AKA        DORSALIS PEDIS      ANTERIOR TIBIAL  not palpable, + Doppler signal   AKA        PERONEAL  not palpable, + Doppler signal      AKA    Abdomen: soft, NT, no palpable masses. Skin: no rashes, no ulcers noted, see Extremities.  Musculoskeletal: no muscle wasting or atrophy. Right great toe abducted over 2nd toe, + bunion.   Neurologic: A&O X 3; Appropriate Affect ; SENSATION: normal; MOTOR FUNCTION:  moving all extremities equally, motor strength 4/5 throughout. Speech is fluent/normal. CN 2-12 intact except has slight hearing loss.    ASSESSMENT: Collin Pearson is a 83 y.o. male who is s/p left AKA in June of 2017, with history of peripheral artery occlusive disease  and non healing wounds of left lower extremity.   He returns today with c/o 2 year history of pain in his right calf and foot that has worsened in the last 4 months. Pt was evaluated by Dr. Donnetta Hutching 3 months ago. He and his wife indicate that they are trying to be proactive to save his right foot and leg.   There are + Doppler signals in his right DP, PT, and peroneal arteries. However, there is a faint line of demarcation transmetatarsally that has mild erythema distal to toes. There are no open wounds in his foot or lower leg, no gangrene.  I spoke with the sonographer, Catalina Antigua, who said the exam was difficult as pt was unable  to prevent his foot from making jerking type motions. But he did seem to detect a right SFA occlusion, ankle vessels all seemed calcified, and TBI was unreliable due to motion of pt foot, but maybe 0.1 TBI.   Last serum creatinine result on file was 1.72, eGFR of 34 on 02-23-16.   PLAN:  Based on the patient's vascular studies and examination, pt will return to clinic on 02-07-17, see Dr. Donnetta Hutching to discuss results of ABI and right LE arterial duplex that was able to be added on today before pt left; these studies were added after consulting with Dr. Donzetta Matters.   I discussed in depth with the patient the nature of atherosclerosis, and emphasized the importance of maximal medical management including strict control of blood pressure, blood glucose, and lipid levels, obtaining regular exercise, and continued cessation of smoking.  The patient is aware that without maximal medical management the underlying atherosclerotic disease process will progress, limiting the benefit of any interventions.  The patient was given information about PAD including signs, symptoms, treatment, what symptoms should prompt the patient to seek immediate medical care, and risk reduction measures to take.  Clemon Chambers, RN, MSN, FNP-C [facility] Phone: [phone]  Clinic MD: Donzetta Matters  02/03/17 10:40 AM

## 2017-02-07 ENCOUNTER — Encounter: Payer: Self-pay | Admitting: Vascular Surgery

## 2017-02-07 ENCOUNTER — Ambulatory Visit (INDEPENDENT_AMBULATORY_CARE_PROVIDER_SITE_OTHER): Payer: BLUE CROSS/BLUE SHIELD | Admitting: Vascular Surgery

## 2017-02-07 VITALS — BP 132/63 | HR 82 | Temp 98.7°F | Resp 16 | Ht 65.5 in | Wt 140.0 lb

## 2017-02-07 DIAGNOSIS — I70262 Atherosclerosis of native arteries of extremities with gangrene, left leg: Secondary | ICD-10-CM

## 2017-02-07 DIAGNOSIS — I739 Peripheral vascular disease, unspecified: Secondary | ICD-10-CM | POA: Diagnosis not present

## 2017-02-07 NOTE — Addendum Note (Signed)
Addended by: Lianne Cure A on: 02/07/2017 05:19 PM   Modules accepted: Orders

## 2017-02-07 NOTE — Progress Notes (Signed)
[facility] [place]  Patient name: Collin OFFERDAHL MRN: [id number] DOB: 12/06/1926 Sex: male  REASON FOR VISIT: Evaluation right lower extremity symptoms  HPI: Collin Pearson is a 83 y.o. male well-known to me from prior surgical treatment. He is today at with multiple family members concern regarding his right foot. He underwent primary left above-knee amputation 1 year ago today. He had presented with foot gangrene and nonsalvageable foot. I'd seen him several months ago at which time he did have some very superficial ulceration over the medial aspect of his first metatarsal head and also second toe. He does have some excoriation due to very prominent metatarsal head and the fact that his great toe overlaps the second toe. There is no skin breakdown today. He does have some chronic swelling and hemosiderin deposits on the dorsum of his foot. Denies any true arterial rest pain. Does have neuropathic pain. He is familiar with the arterial rest pain that he experienced prior to his left leg amputation reports this is not the same.  Past Medical History:  Diagnosis Date  . Anginal pain ([place])   . Anxiety   . Arthritis   . AS (aortic stenosis)    status post aortic valve replacement with an [employer] model 79 T. fracture bioprosthesis  . CKD (chronic kidney disease) stage 4, GFR 15-29 ml/min (HCC)   . Clotting disorder ([place])   . Coronary artery disease   . Critical lower limb ischemia   . Diabetes mellitus   . GERD (gastroesophageal reflux disease)   . Heart murmur   . Hypertension   . PAF (paroxysmal atrial fibrillation), s/p cabg 06/18/2012  . Peripheral vascular disease ([place])   . PVD (peripheral vascular disease), with hsx of bilateral carotid endarterectomies at [facility], and bilateral carotid stenting 05/18/2012    Family History  Problem Relation Age of Onset  . Heart disease Mother 37  . Heart disease Father  34  . CAD Brother   . Heart disease Maternal Uncle   . Heart disease Paternal Uncle   . Heart disease Maternal Grandfather 68    SOCIAL HISTORY: Social History  Substance Use Topics  . Smoking status: Never Smoker  . Smokeless tobacco: Never Used  . Alcohol use No    No Known Allergies  Current Outpatient Prescriptions  Medication Sig Dispense Refill  . amLODipine (NORVASC) 10 MG tablet Take 1 tablet (10 mg total) by mouth daily. 90 tablet 1  . aspirin EC 81 MG tablet Take 81 mg by mouth 2 (two) times daily.    Marland Kitchen atorvastatin (LIPITOR) 40 MG tablet TAKE 1 TABLET DAILY AT BEDTIME 90 tablet 1  . BD INSULIN SYRINGE ULTRAFINE 31G X 5/16" 1 ML MISC Reported on 02/02/2016    . clotrimazole-betamethasone (LOTRISONE) cream Apply 1 application topically daily as needed (dry skin, itching). Reported on 02/02/2016    . fish oil-omega-3 fatty acids 1000 MG capsule Take 1 g by mouth 3 (three) times daily. Reported on 02/02/2016    . furosemide (LASIX) 80 MG tablet TAKE ONE TABLET DAILY. TAKE AN ADDITIONAL ONE-HALF (1/2) TABLET EVERY OTHER DAY IN THE EVENING IF WEIGHT GOES ABOVE 134 POUNDS 135 tablet 3  . gabapentin (NEURONTIN) 100 MG capsule Take 1 capsule (100 mg total) by mouth at bedtime. (Patient taking differently: Take 300 mg by mouth at bedtime. ) 90 capsule 8  . gabapentin (NEURONTIN) 300 MG capsule Take 1 capsule by mouth daily at 10 pm.    .  glipiZIDE (GLUCOTROL) 5 MG tablet Take 5 mg by mouth 3 (three) times daily.     . hydrALAZINE (APRESOLINE) 100 MG tablet Take 1 tablet (100 mg total) by mouth every 8 (eight) hours. 90 tablet 0  . insulin detemir (LEVEMIR) 100 UNIT/ML injection Inject 0.07 mLs (7 Units total) into the skin at bedtime. 10 mL 11  . Iron TABS Take 160 mg by mouth 2 (two) times daily.     . isosorbide mononitrate (IMDUR) 60 MG 24 hr tablet Take 1 tablet (60 mg total) by mouth daily. 90 tablet 1  . Multiple Vitamin (MULTIVITAMIN WITH MINERALS) TABS Take 1 tablet by mouth  daily.    . ONE TOUCH ULTRA TEST test strip Reported on 02/02/2016    . pantoprazole (PROTONIX) 40 MG tablet Take 40 mg by mouth daily.    Marland Kitchen senna-docusate (SENOKOT-S) 8.6-50 MG tablet Take 1 tablet by mouth at bedtime. 30 tablet 0  . Tamsulosin HCl (FLOMAX) 0.4 MG CAPS Take 0.4 mg by mouth. One tablet in the morning and 1/2 tablet in the evening    . Vitamin D, Ergocalciferol, (DRISDOL) 50000 units CAPS capsule Take 50,000 Units by mouth every 7 (seven) days. Reported on 02/02/2016    . vitamin E (VITAMIN E) 400 UNIT capsule Take 400 Units by mouth daily. Reported on 02/02/2016     No current facility-administered medications for this visit.     REVIEW OF SYSTEMS:  [X]  denotes positive finding, [ ]  denotes negative finding Cardiac  Comments:  Chest pain or chest pressure:    Shortness of breath upon exertion:    Short of breath when lying flat:    Irregular heart rhythm:        Vascular    Pain in calf, thigh, or hip brought on by ambulation:    Pain in feet at night that wakes you up from your sleep:  x   Blood clot in your veins:    Leg swelling:  x         PHYSICAL EXAM: Vitals:   02/07/17 1028  BP: 132/63  Pulse: 82  Resp: 16  Temp: 98.7 F (37.1 C)  TempSrc: Oral  SpO2: 93%  Weight: 140 lb (63.5 kg)  Height: 5' 5.5" (1.664 m)    GENERAL: The patient is a well-nourished male, in no acute distress. The vital signs are documented above. CARDIOVASCULAR: No palpable pulse on the right PULMONARY: There is good air exchange  MUSCULOSKELETAL: Left above-knee amputation well-healed and no tenderness NEUROLOGIC: No focal weakness or paresthesias are detected. SKIN: There are no ulcers or rashes noted. PSYCHIATRIC: The patient has a normal affect.  DATA:  Noninvasive studies revealed probable calcification with neck arm index of 0.5 on the right. He does have imaging showing probable SFA high-grade stenosis  MEDICAL ISSUES: Stable overall. Had a long discussion with the  patient and his family present. I would not recommend the any arteriography since he does not have any tissue loss or arterial rest pain. He will notify us should he develop any nonhealing wounds. He does have stage IV kidney disease and would be at some risk for arteriography. We will see him again in 6 months with repeat ankle arm    Rosetta Posner, MD [facility] Specialists of [facility] Tel [phone] Pager [phone]

## 2017-02-28 ENCOUNTER — Encounter: Payer: Self-pay | Admitting: Neurology

## 2017-03-22 DIAGNOSIS — D631 Anemia in chronic kidney disease: Secondary | ICD-10-CM | POA: Diagnosis not present

## 2017-03-22 DIAGNOSIS — N184 Chronic kidney disease, stage 4 (severe): Secondary | ICD-10-CM | POA: Diagnosis not present

## 2017-04-05 DIAGNOSIS — D631 Anemia in chronic kidney disease: Secondary | ICD-10-CM | POA: Diagnosis not present

## 2017-04-05 DIAGNOSIS — N184 Chronic kidney disease, stage 4 (severe): Secondary | ICD-10-CM | POA: Diagnosis not present

## 2017-04-21 ENCOUNTER — Other Ambulatory Visit (INDEPENDENT_AMBULATORY_CARE_PROVIDER_SITE_OTHER): Payer: Medicare Other

## 2017-04-21 ENCOUNTER — Ambulatory Visit (INDEPENDENT_AMBULATORY_CARE_PROVIDER_SITE_OTHER): Payer: Medicare Other | Admitting: Neurology

## 2017-04-21 ENCOUNTER — Encounter: Payer: Self-pay | Admitting: Neurology

## 2017-04-21 VITALS — BP 140/80 | HR 72 | Resp 16

## 2017-04-21 DIAGNOSIS — E1142 Type 2 diabetes mellitus with diabetic polyneuropathy: Secondary | ICD-10-CM | POA: Diagnosis not present

## 2017-04-21 DIAGNOSIS — H02401 Unspecified ptosis of right eyelid: Secondary | ICD-10-CM

## 2017-04-21 DIAGNOSIS — H532 Diplopia: Secondary | ICD-10-CM | POA: Diagnosis not present

## 2017-04-21 LAB — TSH: TSH: 3.2 u[IU]/mL (ref 0.35–4.50)

## 2017-04-21 MED ORDER — PYRIDOSTIGMINE BROMIDE 60 MG PO TABS: 30.0000 mg | ORAL_TABLET | Freq: Two times a day (BID) | ORAL | 5 refills | Status: DC

## 2017-04-21 NOTE — Patient Instructions (Addendum)
1.  Check labs for myasthenia gravis 2.  Start mestinon 30mg  (half-tablet) twice daily x 1 week, then increase to 1 tablet twice daily 3.  Call with an update in 2 weeks  We will call you with the results and let you know the next step whether we need to proceed with nerve testing or MRI brain  Return to clinic in 3-4 months

## 2017-04-21 NOTE — Progress Notes (Signed)
Collin Pearson Note - Initial Visit   Date: 04/21/17  CATON POPOWSKI MRN: [id number] DOB: [birth year]   Dear Dr. Megan Salon:  Thank you for your kind referral of Collin Pearson for consultation of vision changes. Although his history is well known to you, please allow [name] to reiterate it for the purpose of our medical record. The patient was accompanied to the [facility] by friend.   History of Present Illness: Collin Pearson is a 83 y.o. right-handed Caucasian male with diabetes mellitus c/b neuropathy, aortic stenosis s/pp AVR, CKD stage 4, CAD, hypertension, PAD s/p CABG, peripheral vascular disease s/p left AKA presenting for evaluation of vision changes.    Starting around 2-3 years ago, he began having double vision and blurry vision.  Symptoms have progressed and his double vision has become constant with images on top of each other.  Closing one eye, makes the double vision go away. He has difficulty reading the newspaper in the morning and can see better by the afternoon. He saw his eye doctor who referred him to see a neurologist.   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  He has developed new right droopiness over the past few years.   He denies difficulty swallowing, dysarthria, limb weakness, or shortness of breath.   He is predominately wheelchair dependent due to left AKA.      Past Medical History:  Diagnosis Date  . Anginal pain ([place])   . Anxiety   . Arthritis   . AS (aortic stenosis)    status post aortic valve replacement with an [employer] model 41 T. fracture bioprosthesis  . CKD (chronic kidney disease) stage 4, GFR 15-29 ml/min (HCC)   . Clotting disorder ([place])   . Coronary artery disease   . Critical lower limb ischemia   . Diabetes mellitus   . GERD (gastroesophageal reflux  disease)   . Heart murmur   . Hypertension   . PAF (paroxysmal atrial fibrillation), s/p cabg 06/18/2012  . Peripheral vascular disease ([place])   . PVD (peripheral vascular disease), with hsx of bilateral carotid endarterectomies at [facility], and bilateral carotid stenting 05/18/2012    Past Surgical History:  Procedure Laterality Date  . ABOVE KNEE LEG AMPUTATION Left 02/08/2016  . AMPUTATION Left 02/08/2016   Procedure: AMPUTATION ABOVE KNEE;  Surgeon: Rosetta Posner, MD;  Location: [place];  Service: Vascular;  Laterality: Left;  . AORTIC VALVE REPLACEMENT  06/13/2012   Procedure: AORTIC VALVE REPLACEMENT (AVR);  Surgeon: Grace Isaac, MD;  Location: [place];  Service: Open Heart Surgery;  Laterality: N/A;  . CARDIAC CATHETERIZATION    . cardiac stents    . Cardiac Stress Test  06/03/2010   Mild anterolateral ischemis  . CARDIAC VALVE REPLACEMENT    . CAROTID ANGIOGRAM N/A 05/18/2012   Procedure: CAROTID ANGIOGRAM;  Surgeon: Lorretta Harp, MD;  Location: [facility] CATH LAB;  Service: Cardiovascular;  Laterality: N/A;  . CORONARY ARTERY BYPASS GRAFT  06/13/2012   Procedure: CORONARY ARTERY BYPASS GRAFTING (CABG);  Surgeon: Grace Isaac, MD;  Location: [place];  Service: Open Heart Surgery;  Laterality: N/A;  must start at 50 - appointment at 0800  . ENDARTERECTOMY    . LEFT AND RIGHT HEART CATHETERIZATION WITH CORONARY ANGIOGRAM N/A 05/18/2012   Procedure: LEFT AND RIGHT HEART  CATHETERIZATION WITH CORONARY ANGIOGRAM;  Surgeon: Lorretta Harp, MD;  Location: [facility] C[facility] LAB;  Service: Cardiovascular;  Laterality: N/A;  . MULTIPLE EXTRACTIONS WITH ALVEOLOPLASTY N/A 02/23/2016   Procedure:  EXTRACTION OF TOOTH #27 WITH ALVEOLOPLASTY AND GROSS dERIDEMENT OF REMAINING TEETH;  Surgeon: Lenn Cal, DDS;  Location: [place];  Service: Oral Surgery;  Laterality: N/A;  . PROSTATE SURGERY       Medications:  Outpatient Encounter Prescriptions as of 04/21/2017  Medication Sig  . amLODipine (NORVASC)  10 MG tablet Take 1 tablet (10 mg total) by mouth daily.  Marland Kitchen aspirin EC 81 MG tablet Take 81 mg by mouth 2 (two) times daily.  Marland Kitchen atorvastatin (LIPITOR) 40 MG tablet TAKE 1 TABLET DAILY AT BEDTIME  . BD INSULIN SYRINGE ULTRAFINE 31G X 5/16" 1 ML MISC Reported on 02/02/2016  . clotrimazole-betamethasone (LOTRISONE) cream Apply 1 application topically daily as needed (dry skin, itching). Reported on 02/02/2016  . fish oil-omega-3 fatty acids 1000 MG capsule Take 1 g by mouth 3 (three) times daily. Reported on 02/02/2016  . furosemide (LASIX) 80 MG tablet TAKE ONE TABLET DAILY. TAKE AN ADDITIONAL ONE-HALF (1/2) TABLET EVERY OTHER DAY IN THE EVENING IF WEIGHT GOES ABOVE 134 POUNDS  . gabapentin (NEURONTIN) 100 MG capsule Take 1 capsule (100 mg total) by mouth at bedtime. (Patient taking differently: Take 300 mg by mouth at bedtime. )  . gabapentin (NEURONTIN) 300 MG capsule Take 1 capsule by mouth daily at 10 pm.  . glipiZIDE (GLUCOTROL) 5 MG tablet Take 5 mg by mouth 3 (three) times daily.   . hydrALAZINE (APRESOLINE) 100 MG tablet Take 1 tablet (100 mg total) by mouth every 8 (eight) hours.  . insulin detemir (LEVEMIR) 100 UNIT/ML injection Inject 0.07 mLs (7 Units total) into the skin at bedtime.  . Iron TABS Take 160 mg by mouth 2 (two) times daily.   . isosorbide mononitrate (IMDUR) 60 MG 24 hr tablet Take 1 tablet (60 mg total) by mouth daily.  . Multiple Vitamin (MULTIVITAMIN WITH MINERALS) TABS Take 1 tablet by mouth daily.  . ONE TOUCH ULTRA TEST test strip Reported on 02/02/2016  . pantoprazole (PROTONIX) 40 MG tablet Take 40 mg by mouth daily.  Marland Kitchen senna-docusate (SENOKOT-S) 8.6-50 MG tablet Take 1 tablet by mouth at bedtime.  . Tamsulosin HCl (FLOMAX) 0.4 MG CAPS Take 0.4 mg by mouth. One tablet in the morning and 1/2 tablet in the evening  . Vitamin D, Ergocalciferol, (DRISDOL) 50000 units CAPS capsule Take 50,000 Units by mouth every 7 (seven) days. Reported on 02/02/2016  . vitamin E (VITAMIN  E) 400 UNIT capsule Take 400 Units by mouth daily. Reported on 02/02/2016   No facility-administered encounter medications on file as of 04/21/2017.      Allergies: No Known Allergies  Family History: Family History  Problem Relation Age of Onset  . Heart disease Mother 36  . Heart disease Father 32  . CAD Brother   . Heart disease Maternal Uncle   . Heart disease Paternal Uncle   . Heart disease Maternal Grandfather 68    Social History: Social History  Substance Use Topics  . Smoking status: Never Smoker  . Smokeless tobacco: Never Used  . Alcohol use No   Social History   Social History Narrative  . No narrative on file    Review of Systems:  CONSTITUTIONAL: No fevers, chills, night sweats, or weight loss.   EYES: +visual changes or eye pain ENT: No hearing  changes.  No history of nose bleeds.   RESPIRATORY: No cough, wheezing and shortness of breath.   CARDIOVASCULAR: Negative for chest pain, and palpitations.   GI: Negative for abdominal discomfort, blood in stools or black stools.  No recent change in bowel habits.   GU:  No history of incontinence.   MUSCLOSKELETAL: +history of joint pain +swelling.  No myalgias.   SKIN: Negative for lesions, rash, and itching.   HEMATOLOGY/ONCOLOGY: Negative for prolonged bleeding, bruising easily, and swollen nodes.   ENDOCRINE: Negative for cold or heat intolerance, polydipsia or goiter.   PSYCH:  No depression or anxiety symptoms.   NEURO: As Above.   Vital Signs:  BP 140/80   Pulse 72   Resp 16   SpO2 95%    General Medical Exam:   General:  Well appearing, comfortable, sitting in wheelchair.   Eyes/ENT: see cranial nerve examination.   Neck: No masses appreciated.  Full range of motion without tenderness.  No carotid bruits. Respiratory:  Clear to auscultation, good air entry bilaterally.   Cardiac:  Regular rate and rhythm, no murmur.   Extremities:  No deformities, edema, or skin discoloration.  Skin:  No  rashes or lesions.  Neurological Exam: MENTAL STATUS including orientation to time, place, person, recent and remote memory, attention span and concentration, language, and fund of knowledge is normal.  Speech is not dysarthric.  CRANIAL NERVES: II:  No visual field defects.  Unremarkable fundi.   III-IV-VI: Pupils equal round and reactive to light.  Normal conjugate, extra-ocular eye movements in all directions of gaze, except left lateral gaze.  No nystagmus.  Bilateral ptosis R > L, worse with upgaze.     V:  Normal facial sensation.  VII:  Normal facial symmetry and movements.  Orbicularis oculi, frontalis, and orbicular oris is 5/5.  VIII:  Normal hearing and vestibular function.   IX-X:  Normal palatal movement.   XI:  Normal shoulder shrug and head rotation.   XII:  Normal tongue strength and range of motion, no deviation or fasciculation.  MOTOR: Left AKA, hip flexion is 5/5.  Motor strength is 5/5 throughout without fatigability.   No atrophy, fasciculations or abnormal movements.  No pronator drift.  Tone is normal.    MSRs:  Right                                                                 Left brachioradialis 2+  brachioradialis 2+  biceps 2+  biceps 2+  triceps 2+  triceps 2+  patellar 0  Patellar n/a  ankle jerk 0  ankle jerk n/a   SENSORY:  Vibration is reduced at the right knee and absent at the ankles.  Temperature is reduced below the knee distally.  COORDINATION/GAIT: Normal finger-to- nose-finger.  Intact rapid alternating movements bilaterally.  Gait was not tested as patient is wheelchair-bound due to left AKA and does not have prosthetic leg.   IMPRESSION: Mr. Loughmiller is a delightful 83 year-old gentleman referred for evaluation of diplopia.  His exam shows mild weakness with left lateral rectus movement and R > L ptosis.  Symptoms are suggestive of myasthenia gravis, but I would expect symptoms to be worse in the evening which is the opposite of what he is  experiencing.  I will check AChR antibody, TSH, and offer him a trial of mestinon.  If there is no improvement despite titrating his mestinon, I will bring him back for NCS/EMG with repetitive nerve stimulating and obtain MRI brain to be sure there is no central CNS pathology to explain symptoms.  Low likelihood of the latter, given the absence of UMN findings and other cranial nerve involvement.   He has known diabetic neuropathy which is also evident on his exam and he takes gabapentin 300-400mg  at bedtime for this    PLAN/RECOMMENDATIONS:  1.  Check AChR antibody testing, TSH 2.  Start mestinon 30mg  twice daily x 1 week, then increase to 60mg  BID 3.  Consider MRI brain without contrast, if antibody is negative, and NCS/EMG with RNS 4.  Patient to call with update in 2 weeks  Return to [facility] in 3 months.   The duration of this appointment visit was 45 minutes of face-to-face time with the patient.  Greater than 50% of this time was spent in counseling, explanation of diagnosis, planning of further management, and coordination of care.   Thank you for allowing me to participate in patient's care.  If I can answer any additional questions, I would be pleased to do so.    Sincerely,    Donika K. Posey Pronto, DO

## 2017-04-27 LAB — MYASTHENIA GRAVIS PANEL 2
A CHR BINDING ABS: <0.3 nmol/L
Acetylchol Modul Ab: 8 % Inhibition

## 2017-05-01 ENCOUNTER — Telehealth: Payer: Self-pay | Admitting: *Deleted

## 2017-05-01 NOTE — Telephone Encounter (Signed)
Patient and wife notified that labs are normal.  Patient said that his vision is a little better so I instructed him to continue medication.

## 2017-05-01 NOTE — Telephone Encounter (Signed)
-----   Message from Alda Berthold, DO sent at 05/01/2017  9:55 AM EDT ----- Please notify patient lab are within normal limits.  Did he notice any difference with vision after taking mestinon?  If not, we will need to order MRI brain wo contrast as the next step. Thank you.

## 2017-05-03 DIAGNOSIS — D631 Anemia in chronic kidney disease: Secondary | ICD-10-CM | POA: Diagnosis not present

## 2017-05-03 DIAGNOSIS — N184 Chronic kidney disease, stage 4 (severe): Secondary | ICD-10-CM | POA: Diagnosis not present

## 2017-05-03 DIAGNOSIS — I129 Hypertensive chronic kidney disease with stage 1 through stage 4 chronic kidney disease, or unspecified chronic kidney disease: Secondary | ICD-10-CM | POA: Diagnosis not present

## 2017-05-03 DIAGNOSIS — M9080 Osteopathy in diseases classified elsewhere, unspecified site: Secondary | ICD-10-CM | POA: Diagnosis not present

## 2017-05-03 DIAGNOSIS — E889 Metabolic disorder, unspecified: Secondary | ICD-10-CM | POA: Diagnosis not present

## 2017-05-09 DIAGNOSIS — D631 Anemia in chronic kidney disease: Secondary | ICD-10-CM | POA: Diagnosis not present

## 2017-05-09 DIAGNOSIS — N184 Chronic kidney disease, stage 4 (severe): Secondary | ICD-10-CM | POA: Diagnosis not present

## 2017-05-11 ENCOUNTER — Telehealth: Payer: Self-pay | Admitting: Neurology

## 2017-05-11 NOTE — Telephone Encounter (Signed)
Pt left a voicemail message asking for a cal back but did not say why

## 2017-05-12 NOTE — Telephone Encounter (Signed)
Patient said that the medication is helping but says that it gives him diarrhea.  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

## 2017-05-16 DIAGNOSIS — E1151 Type 2 diabetes mellitus with diabetic peripheral angiopathy without gangrene: Secondary | ICD-10-CM | POA: Diagnosis not present

## 2017-05-16 DIAGNOSIS — Z23 Encounter for immunization: Secondary | ICD-10-CM | POA: Diagnosis not present

## 2017-05-16 DIAGNOSIS — E1140 Type 2 diabetes mellitus with diabetic neuropathy, unspecified: Secondary | ICD-10-CM | POA: Diagnosis not present

## 2017-05-16 DIAGNOSIS — Z89612 Acquired absence of left leg above knee: Secondary | ICD-10-CM | POA: Diagnosis not present

## 2017-05-16 DIAGNOSIS — I10 Essential (primary) hypertension: Secondary | ICD-10-CM | POA: Diagnosis not present

## 2017-05-19 ENCOUNTER — Telehealth: Payer: Self-pay | Admitting: Neurology

## 2017-05-19 DIAGNOSIS — H532 Diplopia: Secondary | ICD-10-CM

## 2017-05-19 DIAGNOSIS — H02401 Unspecified ptosis of right eyelid: Secondary | ICD-10-CM

## 2017-05-19 NOTE — Telephone Encounter (Signed)
Patient is currently on 1/2 tablet BID of Mestinon 60 mg. He is having daily diarrhea. Made aware okay to stop medication.

## 2017-05-19 NOTE — Telephone Encounter (Signed)
Would like the results of the blood work and what do they need to do next

## 2017-05-19 NOTE — Telephone Encounter (Signed)
Spoke with patient- he wanted results from lab work. He doesn't remember speaking with Caryl Pina to get results on 05/01/17.  I made patient aware labs were normal. He is concerned about Mestinon. He states he does not notice an improvement in vision after taking medication. He is also having diarrhea. He wants to know if he should continue taking medication. Please advise.   Since he states no improvement, per phone note from Dr. Posey Pronto on 05/01/17 I have placed an order for MR brain wo contrast to [facility] for patient. They will call him directly to schedule, but he was given the number to call himself if needed.   Please advise on medication.

## 2017-05-19 NOTE — Telephone Encounter (Signed)
If he can tolerate mestinon 30mg  twice daily (half-tabet), recommend that he stay on that.  If this dose causes diarrhea, ok to stop.

## 2017-05-23 ENCOUNTER — Ambulatory Visit: Payer: Medicare Other | Admitting: Diagnostic Neuroimaging

## 2017-05-30 DIAGNOSIS — Z4781 Encounter for orthopedic aftercare following surgical amputation: Secondary | ICD-10-CM | POA: Diagnosis not present

## 2017-05-30 DIAGNOSIS — R2689 Other abnormalities of gait and mobility: Secondary | ICD-10-CM | POA: Diagnosis not present

## 2017-05-30 DIAGNOSIS — Z89612 Acquired absence of left leg above knee: Secondary | ICD-10-CM | POA: Diagnosis not present

## 2017-05-30 DIAGNOSIS — M6281 Muscle weakness (generalized): Secondary | ICD-10-CM | POA: Diagnosis not present

## 2017-06-01 ENCOUNTER — Telehealth: Payer: Self-pay | Admitting: *Deleted

## 2017-06-01 ENCOUNTER — Ambulatory Visit
Admission: RE | Admit: 2017-06-01 | Discharge: 2017-06-01 | Disposition: A | Discharge status: Home / Self Care | Payer: Medicare Other | Source: Ambulatory Visit | Attending: Neurology | Admitting: Neurology

## 2017-06-01 DIAGNOSIS — H02401 Unspecified ptosis of right eyelid: Secondary | ICD-10-CM

## 2017-06-01 DIAGNOSIS — H532 Diplopia: Secondary | ICD-10-CM

## 2017-06-01 NOTE — Telephone Encounter (Signed)
-----   Message from Alda Berthold, DO sent at 06/01/2017  2:22 PM EDT ----- Please inform patient that his MRI brain shows age-related changes and atrophy and nothing worrisome causing his double vision.

## 2017-06-01 NOTE — Telephone Encounter (Signed)
Left message giving patient results.  

## 2017-06-02 ENCOUNTER — Ambulatory Visit: Payer: Self-pay | Admitting: Neurology

## 2017-06-06 DIAGNOSIS — N184 Chronic kidney disease, stage 4 (severe): Secondary | ICD-10-CM | POA: Diagnosis not present

## 2017-06-06 DIAGNOSIS — D631 Anemia in chronic kidney disease: Secondary | ICD-10-CM | POA: Diagnosis not present

## 2017-06-09 DIAGNOSIS — Z4781 Encounter for orthopedic aftercare following surgical amputation: Secondary | ICD-10-CM | POA: Diagnosis not present

## 2017-06-09 DIAGNOSIS — M6281 Muscle weakness (generalized): Secondary | ICD-10-CM | POA: Diagnosis not present

## 2017-06-09 DIAGNOSIS — Z89612 Acquired absence of left leg above knee: Secondary | ICD-10-CM | POA: Diagnosis not present

## 2017-06-09 DIAGNOSIS — R2689 Other abnormalities of gait and mobility: Secondary | ICD-10-CM | POA: Diagnosis not present

## 2017-06-15 DIAGNOSIS — E1151 Type 2 diabetes mellitus with diabetic peripheral angiopathy without gangrene: Secondary | ICD-10-CM | POA: Diagnosis not present

## 2017-06-15 DIAGNOSIS — M2011 Hallux valgus (acquired), right foot: Secondary | ICD-10-CM | POA: Diagnosis not present

## 2017-06-15 DIAGNOSIS — Z89612 Acquired absence of left leg above knee: Secondary | ICD-10-CM | POA: Diagnosis not present

## 2017-06-15 DIAGNOSIS — Z794 Long term (current) use of insulin: Secondary | ICD-10-CM | POA: Diagnosis not present

## 2017-06-20 DIAGNOSIS — D631 Anemia in chronic kidney disease: Secondary | ICD-10-CM | POA: Diagnosis not present

## 2017-06-20 DIAGNOSIS — N184 Chronic kidney disease, stage 4 (severe): Secondary | ICD-10-CM | POA: Diagnosis not present

## 2017-06-22 ENCOUNTER — Other Ambulatory Visit: Payer: Self-pay | Admitting: *Deleted

## 2017-06-22 DIAGNOSIS — R2689 Other abnormalities of gait and mobility: Secondary | ICD-10-CM | POA: Diagnosis not present

## 2017-06-22 DIAGNOSIS — M6281 Muscle weakness (generalized): Secondary | ICD-10-CM | POA: Diagnosis not present

## 2017-06-22 DIAGNOSIS — Z4781 Encounter for orthopedic aftercare following surgical amputation: Secondary | ICD-10-CM | POA: Diagnosis not present

## 2017-06-22 DIAGNOSIS — Z89612 Acquired absence of left leg above knee: Secondary | ICD-10-CM | POA: Diagnosis not present

## 2017-06-22 MED ORDER — AMLODIPINE BESYLATE 10 MG PO TABS: 10.0000 mg | ORAL_TABLET | Freq: Every day | ORAL | 1 refills | Status: DC

## 2017-06-27 DIAGNOSIS — Z79899 Other long term (current) drug therapy: Secondary | ICD-10-CM | POA: Diagnosis not present

## 2017-06-27 DIAGNOSIS — Z7409 Other reduced mobility: Secondary | ICD-10-CM | POA: Diagnosis not present

## 2017-06-27 DIAGNOSIS — E1140 Type 2 diabetes mellitus with diabetic neuropathy, unspecified: Secondary | ICD-10-CM | POA: Diagnosis not present

## 2017-06-27 DIAGNOSIS — Z89612 Acquired absence of left leg above knee: Secondary | ICD-10-CM | POA: Diagnosis not present

## 2017-06-27 DIAGNOSIS — E1151 Type 2 diabetes mellitus with diabetic peripheral angiopathy without gangrene: Secondary | ICD-10-CM | POA: Diagnosis not present

## 2017-06-29 DIAGNOSIS — Z89612 Acquired absence of left leg above knee: Secondary | ICD-10-CM | POA: Diagnosis not present

## 2017-06-29 DIAGNOSIS — R2689 Other abnormalities of gait and mobility: Secondary | ICD-10-CM | POA: Diagnosis not present

## 2017-06-29 DIAGNOSIS — M6281 Muscle weakness (generalized): Secondary | ICD-10-CM | POA: Diagnosis not present

## 2017-06-29 DIAGNOSIS — Z4781 Encounter for orthopedic aftercare following surgical amputation: Secondary | ICD-10-CM | POA: Diagnosis not present

## 2017-06-30 DIAGNOSIS — M6281 Muscle weakness (generalized): Secondary | ICD-10-CM | POA: Diagnosis not present

## 2017-06-30 DIAGNOSIS — Z89612 Acquired absence of left leg above knee: Secondary | ICD-10-CM | POA: Diagnosis not present

## 2017-06-30 DIAGNOSIS — Z4781 Encounter for orthopedic aftercare following surgical amputation: Secondary | ICD-10-CM | POA: Diagnosis not present

## 2017-06-30 DIAGNOSIS — R2689 Other abnormalities of gait and mobility: Secondary | ICD-10-CM | POA: Diagnosis not present

## 2017-07-03 ENCOUNTER — Telehealth: Payer: Self-pay | Admitting: Neurology

## 2017-07-03 NOTE — Telephone Encounter (Signed)
Called [name] back but she was not home.  Requested for patient to write information down.  EMG is on 07-19-19 at 10:00.  He will inform [name].

## 2017-07-03 NOTE — Telephone Encounter (Signed)
He needs to have nerve testing for his double vision. Recommend NCS/EMG of the right side - MG protocol.

## 2017-07-03 NOTE — Telephone Encounter (Signed)
[name] called and said the MRI showed nothing and is wanting to know where to go from here

## 2017-07-03 NOTE — Telephone Encounter (Signed)
Please advise 

## 2017-07-05 DIAGNOSIS — Z89612 Acquired absence of left leg above knee: Secondary | ICD-10-CM | POA: Diagnosis not present

## 2017-07-05 DIAGNOSIS — R2689 Other abnormalities of gait and mobility: Secondary | ICD-10-CM | POA: Diagnosis not present

## 2017-07-05 DIAGNOSIS — Z4781 Encounter for orthopedic aftercare following surgical amputation: Secondary | ICD-10-CM | POA: Diagnosis not present

## 2017-07-05 DIAGNOSIS — M6281 Muscle weakness (generalized): Secondary | ICD-10-CM | POA: Diagnosis not present

## 2017-07-18 ENCOUNTER — Encounter: Payer: Self-pay | Admitting: Neurology

## 2017-07-18 DIAGNOSIS — N184 Chronic kidney disease, stage 4 (severe): Secondary | ICD-10-CM | POA: Diagnosis not present

## 2017-07-18 DIAGNOSIS — D631 Anemia in chronic kidney disease: Secondary | ICD-10-CM | POA: Diagnosis not present

## 2017-07-21 DIAGNOSIS — R2689 Other abnormalities of gait and mobility: Secondary | ICD-10-CM | POA: Diagnosis not present

## 2017-07-21 DIAGNOSIS — Z89612 Acquired absence of left leg above knee: Secondary | ICD-10-CM | POA: Diagnosis not present

## 2017-07-21 DIAGNOSIS — M6281 Muscle weakness (generalized): Secondary | ICD-10-CM | POA: Diagnosis not present

## 2017-07-21 DIAGNOSIS — Z4781 Encounter for orthopedic aftercare following surgical amputation: Secondary | ICD-10-CM | POA: Diagnosis not present

## 2017-07-25 ENCOUNTER — Encounter: Payer: Self-pay | Admitting: Neurology

## 2017-07-31 ENCOUNTER — Other Ambulatory Visit: Payer: Self-pay | Admitting: *Deleted

## 2017-07-31 DIAGNOSIS — R200 Anesthesia of skin: Secondary | ICD-10-CM

## 2017-08-04 ENCOUNTER — Ambulatory Visit: Payer: Self-pay | Admitting: Neurology

## 2017-08-10 ENCOUNTER — Ambulatory Visit (INDEPENDENT_AMBULATORY_CARE_PROVIDER_SITE_OTHER): Payer: Medicare Other | Admitting: Neurology

## 2017-08-10 DIAGNOSIS — R200 Anesthesia of skin: Secondary | ICD-10-CM

## 2017-08-10 DIAGNOSIS — G5601 Carpal tunnel syndrome, right upper limb: Secondary | ICD-10-CM

## 2017-08-10 DIAGNOSIS — G629 Polyneuropathy, unspecified: Secondary | ICD-10-CM

## 2017-08-10 NOTE — Procedures (Signed)
[facility]  [address]  [place], [place] [address] Tel: [phone] Fax:  [phone] Test Date:  08/10/2017  Patient: Collin Pearson DOB: 01-Jan-1927 Physician: Narda Amber, DO  Sex: Male Height: 5\' 5"  Ref Phys: Narda Amber, DO  ID#: [id number] Temp: 33.4C Technician:    Patient Complaints: This is a 83 year old gentleman referred for evaluation of monocular diplopia.  NCV & EMG Findings: Extensive electrodiagnostic testing of the right upper and lower extremity shows:  1. Right sural and median sensory responses are absent. Right ulnar sensory response shows mildly prolonged latency and reduced amplitude. Right radial sensory responses within normal limits.  2. Right median motor response shows prolonged latency and reduced amplitude. Right peroneal motor responses within normal limits.  3. Repetitive nerve stimulation of the median, spinal accessory, and peroneal nerves and recording at the abductor pollicis brevis, trapezius, and tibialis anterior muscles, respectively, are within normal limits.  4. Chronic motor axon loss changes were isolated to the right abductor pollicis brevis muscle, without accompanied active denervation.   Impression: 1. The electrodiagnostic findings show a length dependent sensorimotor polyneuropathy, predominantly axon loss in type, affecting the right side.  2. There is also superimposed right median neuropathy at or distal to the wrist, consistent with clinical diagnosis of carpal tunnel syndrome. Overall, these findings are severe in degree electrically.  3. There is no evidence of a neuromuscular junction disorder.   ___________________________ Narda Amber, DO    Nerve Conduction Studies Anti Sensory Summary Table   Site NR Peak (ms) Norm Peak (ms) P-T Amp (V) Norm P-T Amp  Right Median Anti Sensory (2nd Digit)  Wrist NR  <3.8  >10  Right Radial Anti Sensory (Base 1st Digit)  Wrist    2.4 <2.8 13.1 >10  Right  Sural Anti Sensory (Lat Mall)  Calf NR  <4.6  >3  Right Ulnar Anti Sensory (5th Digit)  Wrist    3.4 <3.2 4.3 >5   Motor Summary Table   Site NR Onset (ms) Norm Onset (ms) O-P Amp (mV) Norm O-P Amp Site1 Site2 Delta-0 (ms) Dist (cm) Vel (m/s) Norm Vel (m/s)  Right Median Motor (Abd Poll Brev)  Wrist    6.9 <4.0 3.0 >5 Elbow Wrist 4.6 27.0 59 >50  Elbow    11.5  2.9         Right Peroneal TA Motor (Tib Ant)  Fib Head    3.4 <4.5 4.0 >3 Poplit Fib Head 1.3 10.0 77 >40  Poplit    4.7  3.7          EMG   Side Muscle Ins Act Fibs Psw Fasc Number Recrt Dur Dur. Amp Amp. Poly Poly. Comment  Right AntTibialis Nml Nml Nml Nml Nml Nml Nml Nml Nml Nml Nml Nml N/A  Right 1stDorInt Nml Nml Nml Nml Nml Nml Nml Nml Nml Nml Nml Nml N/A  Right PronatorTeres Nml Nml Nml Nml Nml Nml Nml Nml Nml Nml Nml Nml N/A  Right Biceps Nml Nml Nml Nml Nml Nml Nml Nml Nml Nml Nml Nml N/A  Right Deltoid Nml Nml Nml Nml Nml Nml Nml Nml Nml Nml Nml Nml N/A  Right Triceps Nml Nml Nml Nml Nml Nml Nml Nml Nml Nml Nml Nml N/A  Right Abd Poll Brev Nml Nml Nml Nml 1- Rapid Some 1+ Some 1+ Nml Nml N/A   RNS   Trial # Label Amp 1 (mV)  O-P Amp 5 (mV)  O-P Amp % Dif  Area 1 (mVms) Area 5 (mVms) Area % Dif Rep Rate Train Length Pause Time (min:sec) Comments  Right Abd Poll Brev  Tr 1 Baseline 2.59 2.40 -7.5 8.43 7.66 -9.2 3.00 10 00:30   Tr 2 Post Exercise 2.52 2.30 -8.7 7.95 7.10 -10.7 3.00 10 01:00   Tr 3 1 Min Post 2.56 2.42 -5.6 7.89 7.50 -4.9 3.00 10 01:00   Tr 4 2 Min Post 2.58 2.37 -8.2 8.18 7.48 -8.6 3.00 10 01:00   Tr 5 3 Min Post 2.61 2.45 -6.4 8.28 7.62 -7.9 3.00 10 00:00   Right Trapezius  Tr 1 Baseline 2.40 2.57 7.1 13.93 13.51 -3.0 3.00 10 00:30   Tr 2 Post Exercise 2.99 3.06 2.5 17.57 16.82 -4.2 3.00 10 01:00   Tr 3 1 Min Post 3.05 3.03 -0.5 16.98 15.62 -8.0 3.00 10 01:00   Tr 4 2 Min Post 3.23 3.12 -3.6 17.53 15.89 -9.4 3.00 10 01:00   Tr 6 3 Min Post 3.61 3.44 -4.6 18.94 17.20 -9.2 3.00 10 00:00     Right AntTibialis  Tr 1 Baseline 3.07 3.23 5.4 17.34 16.08 -7.3 3.00 10 00:30   Tr 3 Post Exercise 3.73 3.61 -3.2 20.12 17.87 -11.2 3.00 10 01:00   Tr 4 1 Min Post 3.42 3.90 14.0 19.24 18.19 -5.5 3.00 10 01:00   Tr 5 2 Min Post 3.56 3.78 6.3 20.00 18.09 -9.6 3.00 10 01:00   Tr 6 3 Min Post 3.58 3.80 6.1 20.18 18.80 -6.8 3.00 10 00:00              Waveforms:

## 2017-08-11 ENCOUNTER — Telehealth: Payer: Self-pay | Admitting: *Deleted

## 2017-08-11 NOTE — Telephone Encounter (Signed)
-----   Message from Alda Berthold, DO sent at 08/11/2017  2:01 PM EST ----- Please inform patient that his nerve testing did not reveal cause for his double vision.  He has R carpal tunnel syndrome and neuropathy, but this would only cause numbness/tingling of the hands and feet.  If he would like to be evaluated further, recommend seeing neuromuscular specialist at [facility] for single fiber EMG which involves a needle inserted near the eye muscles to look for abnormal activity.

## 2017-08-11 NOTE — Telephone Encounter (Signed)
I spoke with Collin Pearson and gave her the results and next step.  She said that they would discuss this and call me back to let me know if they would like to go to [facility].

## 2017-08-18 ENCOUNTER — Encounter: Payer: Self-pay | Admitting: *Deleted

## 2017-08-18 MED ORDER — ATORVASTATIN CALCIUM 40 MG PO TABS: 40.0000 mg | ORAL_TABLET | Freq: Every day | ORAL | 1 refills | Status: DC

## 2017-08-18 NOTE — Telephone Encounter (Signed)
This encounter was created in error - please disregard.

## 2017-08-22 ENCOUNTER — Ambulatory Visit: Payer: Self-pay | Admitting: Vascular Surgery

## 2017-08-22 ENCOUNTER — Encounter (HOSPITAL_COMMUNITY): Payer: Self-pay

## 2017-08-28 ENCOUNTER — Other Ambulatory Visit: Payer: Self-pay | Admitting: Cardiovascular Disease

## 2017-08-28 DIAGNOSIS — C44329 Squamous cell carcinoma of skin of other parts of face: Secondary | ICD-10-CM | POA: Diagnosis not present

## 2017-08-28 DIAGNOSIS — L570 Actinic keratosis: Secondary | ICD-10-CM | POA: Diagnosis not present

## 2017-08-28 DIAGNOSIS — D485 Neoplasm of uncertain behavior of skin: Secondary | ICD-10-CM | POA: Diagnosis not present

## 2017-09-05 DIAGNOSIS — N184 Chronic kidney disease, stage 4 (severe): Secondary | ICD-10-CM | POA: Diagnosis not present

## 2017-09-05 DIAGNOSIS — D631 Anemia in chronic kidney disease: Secondary | ICD-10-CM | POA: Diagnosis not present

## 2017-09-18 DIAGNOSIS — I10 Essential (primary) hypertension: Secondary | ICD-10-CM | POA: Diagnosis not present

## 2017-09-18 DIAGNOSIS — Z89612 Acquired absence of left leg above knee: Secondary | ICD-10-CM | POA: Diagnosis not present

## 2017-09-18 DIAGNOSIS — E1151 Type 2 diabetes mellitus with diabetic peripheral angiopathy without gangrene: Secondary | ICD-10-CM | POA: Diagnosis not present

## 2017-09-18 DIAGNOSIS — E1140 Type 2 diabetes mellitus with diabetic neuropathy, unspecified: Secondary | ICD-10-CM | POA: Diagnosis not present

## 2017-09-18 DIAGNOSIS — N184 Chronic kidney disease, stage 4 (severe): Secondary | ICD-10-CM | POA: Diagnosis not present

## 2017-09-19 DIAGNOSIS — E782 Mixed hyperlipidemia: Secondary | ICD-10-CM | POA: Diagnosis not present

## 2017-09-19 DIAGNOSIS — E1165 Type 2 diabetes mellitus with hyperglycemia: Secondary | ICD-10-CM | POA: Diagnosis not present

## 2017-09-19 DIAGNOSIS — I2510 Atherosclerotic heart disease of native coronary artery without angina pectoris: Secondary | ICD-10-CM | POA: Diagnosis not present

## 2017-09-19 DIAGNOSIS — I5022 Chronic systolic (congestive) heart failure: Secondary | ICD-10-CM | POA: Diagnosis not present

## 2017-09-19 DIAGNOSIS — I130 Hypertensive heart and chronic kidney disease with heart failure and stage 1 through stage 4 chronic kidney disease, or unspecified chronic kidney disease: Secondary | ICD-10-CM | POA: Diagnosis not present

## 2017-09-26 ENCOUNTER — Encounter (HOSPITAL_COMMUNITY): Payer: Self-pay

## 2017-09-26 ENCOUNTER — Ambulatory Visit: Payer: Medicare Other | Admitting: Vascular Surgery

## 2017-09-26 DIAGNOSIS — G63 Polyneuropathy in diseases classified elsewhere: Secondary | ICD-10-CM | POA: Diagnosis not present

## 2017-09-26 DIAGNOSIS — Z89612 Acquired absence of left leg above knee: Secondary | ICD-10-CM | POA: Diagnosis not present

## 2017-09-26 DIAGNOSIS — I5042 Chronic combined systolic (congestive) and diastolic (congestive) heart failure: Secondary | ICD-10-CM | POA: Diagnosis not present

## 2017-09-26 DIAGNOSIS — Z7409 Other reduced mobility: Secondary | ICD-10-CM | POA: Diagnosis not present

## 2017-09-26 DIAGNOSIS — E119 Type 2 diabetes mellitus without complications: Secondary | ICD-10-CM | POA: Diagnosis not present

## 2017-10-03 DIAGNOSIS — D631 Anemia in chronic kidney disease: Secondary | ICD-10-CM | POA: Diagnosis not present

## 2017-10-03 DIAGNOSIS — N184 Chronic kidney disease, stage 4 (severe): Secondary | ICD-10-CM | POA: Diagnosis not present

## 2017-10-17 DIAGNOSIS — N184 Chronic kidney disease, stage 4 (severe): Secondary | ICD-10-CM | POA: Diagnosis not present

## 2017-10-17 DIAGNOSIS — D631 Anemia in chronic kidney disease: Secondary | ICD-10-CM | POA: Diagnosis not present

## 2017-11-07 ENCOUNTER — Ambulatory Visit (HOSPITAL_COMMUNITY)
Admission: RE | Admit: 2017-11-07 | Discharge: 2017-11-07 | Disposition: A | Discharge status: Home / Self Care | Payer: Medicare Other | Source: Ambulatory Visit | Attending: Vascular Surgery | Admitting: Vascular Surgery

## 2017-11-07 ENCOUNTER — Encounter: Payer: Self-pay | Admitting: Vascular Surgery

## 2017-11-07 ENCOUNTER — Ambulatory Visit (INDEPENDENT_AMBULATORY_CARE_PROVIDER_SITE_OTHER): Payer: Medicare Other | Admitting: Vascular Surgery

## 2017-11-07 VITALS — BP 140/65 | HR 73 | Temp 97.7°F | Resp 16 | Ht 65.5 in | Wt 140.0 lb

## 2017-11-07 DIAGNOSIS — R0989 Other specified symptoms and signs involving the circulatory and respiratory systems: Secondary | ICD-10-CM | POA: Diagnosis not present

## 2017-11-07 DIAGNOSIS — Z89612 Acquired absence of left leg above knee: Secondary | ICD-10-CM | POA: Diagnosis not present

## 2017-11-07 DIAGNOSIS — I739 Peripheral vascular disease, unspecified: Secondary | ICD-10-CM

## 2017-11-07 NOTE — Progress Notes (Signed)
HISTORY AND PHYSICAL     CC:  Follow up Requesting Provider:  Helen Hashimoto., MD  HPI: This is a 84 y.o. male who underwent a primary left above knee amputation on 02/08/16 by Dr. Donnetta Hutching.  He states he has a prosthesis but doesn't use it regularly.  He has a hard time straightening his right leg.   He has a bandaid on his right great toe to keep it from rubbing on the 2nd toe.  He states that his caregiver has been putting an ointment on the 4th  toe and placing bandaid and it is doing well.  He states he has some swelling behind his knee.  He states if he uses a pint jar to rub over this area, it helps.  He has spontaneous jerking of his foot.   The pt is on a statin for cholesterol management.  He takes a daily aspirin.  He is on insulin for diabetes.  He is on a CCB for blood pressure management.    Dr. Gwenlyn Found follows his carotid bruits.    Past Medical History:  Diagnosis Date  . Anginal pain ([employer])   . Anxiety   . Arthritis   . AS (aortic stenosis)    status post aortic valve replacement with an [employer] model 4 T. fracture bioprosthesis  . CKD (chronic kidney disease) stage 4, GFR 15-29 ml/min (HCC)   . Clotting disorder ([place])   . Coronary artery disease   . Critical lower limb ischemia   . Diabetes mellitus   . GERD (gastroesophageal reflux disease)   . Heart murmur   . Hypertension   . PAF (paroxysmal atrial fibrillation), s/p cabg 06/18/2012  . Peripheral vascular disease ([place])   . PVD (peripheral vascular disease), with hsx of bilateral carotid endarterectomies at [facility], and bilateral carotid stenting 05/18/2012    Past Surgical History:  Procedure Laterality Date  . ABOVE KNEE LEG AMPUTATION Left 02/08/2016  . AMPUTATION Left 02/08/2016   Procedure: AMPUTATION ABOVE KNEE;  Surgeon: Rosetta Posner, MD;  Location: [place];  Service: Vascular;  Laterality: Left;  . AORTIC VALVE REPLACEMENT  06/13/2012   Procedure: AORTIC VALVE REPLACEMENT (AVR);  Surgeon:  Grace Isaac, MD;  Location: [place];  Service: Open Heart Surgery;  Laterality: N/A;  . CARDIAC CATHETERIZATION    . cardiac stents    . Cardiac Stress Test  06/03/2010   Mild anterolateral ischemis  . CARDIAC VALVE REPLACEMENT    . CAROTID ANGIOGRAM N/A 05/18/2012   Procedure: CAROTID ANGIOGRAM;  Surgeon: Lorretta Harp, MD;  Location: [facility] CATH LAB;  Service: Cardiovascular;  Laterality: N/A;  . CORONARY ARTERY BYPASS GRAFT  06/13/2012   Procedure: CORONARY ARTERY BYPASS GRAFTING (CABG);  Surgeon: Grace Isaac, MD;  Location: [place];  Service: Open Heart Surgery;  Laterality: N/A;  must start at 27 - appointment at 0800  . ENDARTERECTOMY    . LEFT AND RIGHT HEART CATHETERIZATION WITH CORONARY ANGIOGRAM N/A 05/18/2012   Procedure: LEFT AND RIGHT HEART CATHETERIZATION WITH CORONARY ANGIOGRAM;  Surgeon: Lorretta Harp, MD;  Location: [facility] C[facility] LAB;  Service: Cardiovascular;  Laterality: N/A;  . MULTIPLE EXTRACTIONS WITH ALVEOLOPLASTY N/A 02/23/2016   Procedure:  EXTRACTION OF TOOTH #27 WITH ALVEOLOPLASTY AND GROSS dERIDEMENT OF REMAINING TEETH;  Surgeon: Lenn Cal, DDS;  Location: [place];  Service: Oral Surgery;  Laterality: N/A;  . PROSTATE SURGERY      No Known Allergies  Current Outpatient Medications  Medication  Sig Dispense Refill  . amLODipine (NORVASC) 10 MG tablet TAKE 1 TABLET BY MOUTH  DAILY 90 tablet 1  . aspirin EC 81 MG tablet Take 81 mg by mouth 2 (two) times daily.    Marland Kitchen atorvastatin (LIPITOR) 40 MG tablet Take 1 tablet (40 mg total) by mouth at bedtime. 90 tablet 1  . BD INSULIN SYRINGE ULTRAFINE 31G X 5/16" 1 ML MISC Reported on 02/02/2016    . clotrimazole-betamethasone (LOTRISONE) cream Apply 1 application topically daily as needed (dry skin, itching). Reported on 02/02/2016    . fish oil-omega-3 fatty acids 1000 MG capsule Take 1 g by mouth 3 (three) times daily. Reported on 02/02/2016    . furosemide (LASIX) 80 MG tablet TAKE ONE TABLET DAILY. TAKE AN  ADDITIONAL ONE-HALF (1/2) TABLET EVERY OTHER DAY IN THE EVENING IF WEIGHT GOES ABOVE 134 POUNDS 135 tablet 3  . gabapentin (NEURONTIN) 300 MG capsule Take 1 capsule by mouth daily at 10 pm.    . glipiZIDE (GLUCOTROL) 5 MG tablet Take 5 mg by mouth 3 (three) times daily.     . hydrALAZINE (APRESOLINE) 100 MG tablet Take 1 tablet (100 mg total) by mouth every 8 (eight) hours. 90 tablet 0  . insulin detemir (LEVEMIR) 100 UNIT/ML injection Inject 0.07 mLs (7 Units total) into the skin at bedtime. 10 mL 11  . Iron TABS Take 160 mg by mouth 2 (two) times daily.     . isosorbide mononitrate (IMDUR) 60 MG 24 hr tablet Take 1 tablet (60 mg total) by mouth daily. 90 tablet 1  . Multiple Vitamin (MULTIVITAMIN WITH MINERALS) TABS Take 1 tablet by mouth daily.    . ONE TOUCH ULTRA TEST test strip Reported on 02/02/2016    . pantoprazole (PROTONIX) 40 MG tablet Take 40 mg by mouth daily.    . Tamsulosin HCl (FLOMAX) 0.4 MG CAPS Take 0.4 mg by mouth. One tablet in the morning and 1/2 tablet in the evening    . Vitamin D, Ergocalciferol, (DRISDOL) 50000 units CAPS capsule Take 50,000 Units by mouth every 7 (seven) days. Reported on 02/02/2016    . vitamin E (VITAMIN E) 400 UNIT capsule Take 400 Units by mouth daily. Reported on 02/02/2016     No current facility-administered medications for this visit.     Family History  Problem Relation Age of Onset  . Heart disease Mother 13  . Heart disease Father 32  . CAD Brother   . Heart disease Maternal Uncle   . Heart disease Paternal Uncle   . Heart disease Maternal Grandfather 68    Social History   Socioeconomic History  . Marital status: Married    Spouse name: Not on file  . Number of children: 4  . Years of education: masters  . Highest education level: Not on file  Occupational History  . Not on file  Social Needs  . Financial resource strain: Not on file  . Food insecurity:    Worry: Not on file    Inability: Not on file  . Transportation  needs:    Medical: Not on file    Non-medical: Not on file  Tobacco Use  . Smoking status: Never Smoker  . Smokeless tobacco: Never Used  Substance and Sexual Activity  . Alcohol use: No    Alcohol/week: 0.0 oz  . Drug use: No  . Sexual activity: Not Currently  Lifestyle  . Physical activity:    Days per week: Not on file  Minutes per session: Not on file  . Stress: Not on file  Relationships  . Social connections:    Talks on phone: Not on file    Gets together: Not on file    Attends religious service: Not on file    Active member of club or organization: Not on file    Attends meetings of clubs or organizations: Not on file    Relationship status: Not on file  . Intimate partner violence:    Fear of current or ex partner: Not on file    Emotionally abused: Not on file    Physically abused: Not on file    Forced sexual activity: Not on file  Other Topics Concern  . Not on file  Social History Narrative   Lives with wife in a one story home.  Has 4 children.  Retired Theme park manager.  Education: 2 masters, Designer, jewellery.     REVIEW OF SYSTEMS:   [X]  denotes positive finding, [ ]  denotes negative finding Cardiac  Comments:  Chest pain or chest pressure:    Shortness of breath upon exertion:    Short of breath when lying flat:    Irregular heart rhythm:        Vascular    Pain in calf, thigh, or hip brought on by ambulation:    Pain in feet at night that wakes you up from your sleep:  x Spontaneous jerking in his foot  Blood clot in your veins:    Leg swelling:  x       Pulmonary    Oxygen at home:    Productive cough:     Wheezing:         Neurologic    Sudden weakness in arms or legs:     Sudden numbness in arms or legs:     Sudden onset of difficulty speaking or slurred speech:    Temporary loss of vision in one eye:     Problems with dizziness:         Gastrointestinal    Blood in stool:     Vomited blood:         Genitourinary    Burning when urinating:      Blood in urine:        Psychiatric    Major depression:         Hematologic    Bleeding problems:    Problems with blood clotting too easily:        Skin    Rashes or ulcers:        Constitutional    Fever or chills:      PHYSICAL EXAMINATION:  Vitals:   11/07/17 1426  BP: 140/65  Pulse: 73  Resp: 16  Temp: 97.7 F (36.5 C)  SpO2: 96%   Vitals:   11/07/17 1426  Weight: 140 lb (63.5 kg)  Height: 5' 5.5" (1.664 m)   Body mass index is 22.94 kg/m.  General:  WDWN in NAD; vital signs documented above Gait: Not observed HENT: WNL, normocephalic Pulmonary: normal non-labored breathing , without Rales, rhonchi,  wheezing Cardiac: regular HR, without  Murmurs with carotid bruits bilaterally Abdomen: soft, NT, no masses Skin: without rashes Vascular Exam/Pulses:  Right Left  Radial 2+ (normal) 2+ (normal)  Popliteal Unable to palpate AKA  DP Faint AT doppler signal AKA  PT absent AKA  Peroneal Brisk doppler signal AKA   Extremities: bandaid to right great toe as it overlaps 2nd toe-no wounds present.  4th  toe with small superficial abrasion; erythema of toes to just onto the foot.  Mild edema RLE Musculoskeletal: no muscle wasting or atrophy  Neurologic: A&O X 3;  No focal weakness or paresthesias are detected Psychiatric:  The pt has Normal affect.   Non-Invasive Vascular Imaging:   ABI 11/07/17: unable to obtain due to spontaneous involuntary movement  Pt meds includes: Statin:  Yes.   Beta Blocker:  No. Aspirin:  Yes.   ACEI:  No. ARB:  No. CCB use:  Yes Other Antiplatelet/Anticoagulant:  No   ASSESSMENT/PLAN:: 84 y.o. male with PAD and hx of left AKA June 2017   -pt doing well.  He only has a very small abrasion on the 4th toe that his care giver is taking care of and this is improving per he and family.   -ok to do foot soak for short amount of time and not too hot so as not burn himself -he will f/u with Dr. Donnetta Hutching as needed.  He and family know  to call if he develops a sore that is slow to heal.   -bilateral carotid bruits followed by Dr. Tsosie Billing, PA-C [facility] [phone]  Clinic MD:  Pt seen and examined with Dr. Donnetta Hutching  I have examined the patient, reviewed and agree with above.  Curt Jews, MD 11/07/2017 3:19 PM

## 2017-11-10 IMAGING — MR MR HEAD W/O CM
11 series · 48 of 48 positions shown · non-contrast
Comparison: Head CT 03/23/2016

CLINICAL DATA: Monocular diplopia.  Ptosis, right.

EXAM:
MRI HEAD WITHOUT CONTRAST
TECHNIQUE: Multiplanar, multiecho pulse sequences of the brain and surrounding
structures were obtained without intravenous contrast.

[Series 2: T1 · sagittal · 5.0mm · 0.45mm/px · 3 of 26 slices shown]
[im 1/26]
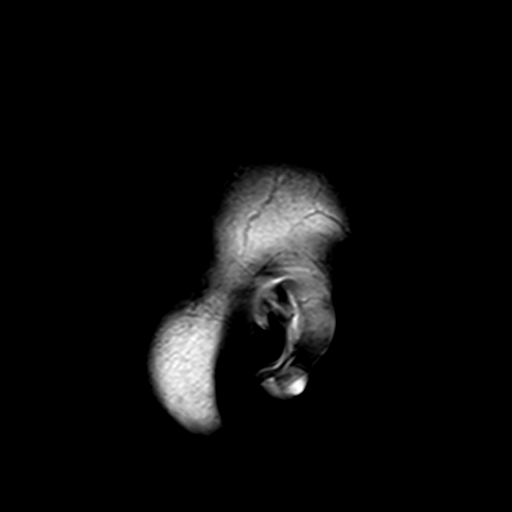
[im 13/26]
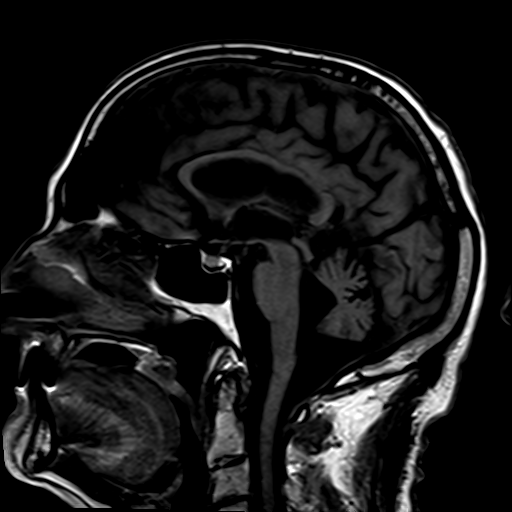
[im 26/26]
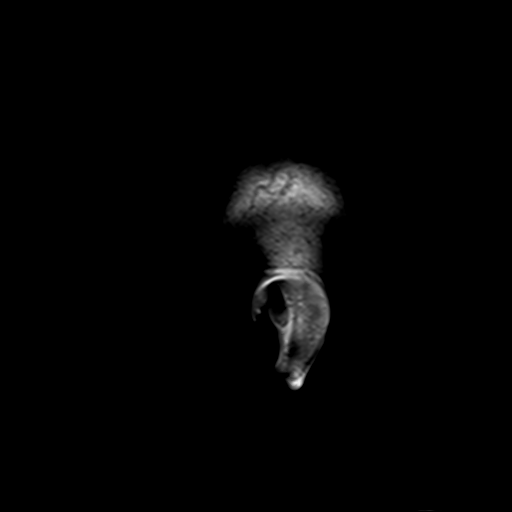

[Series 3: DWI · axial · 3.0mm · 1.80mm/px · z∈[-60,+100]mm · 9 of 110 slices shown (1 of 4)]
[im 1/110]
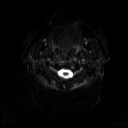
[im 14/110]
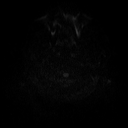
[im 28/110]
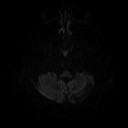
[im 41/110]
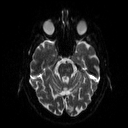
[im 55/110]
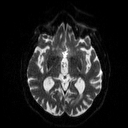
[im 69/110]
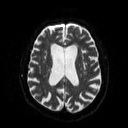
[im 82/110]
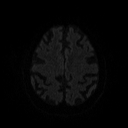
[im 96/110]
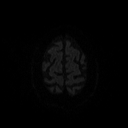
[im 110/110]
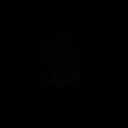

[Series 4: DWI · axial · 3.0mm · 1.80mm/px · z∈[-60,+100]mm · 4 of 55 slices shown (2 of 4)]
[im 1/55]
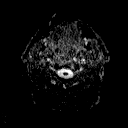
[im 19/55]
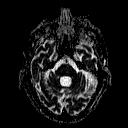
[im 37/55]
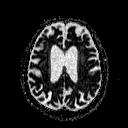
[im 55/55]
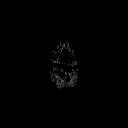

[Series 5: DWI · coronal · 5.0mm · 1.80mm/px · 6 of 79 slices shown (3 of 4)]
[im 1/79]
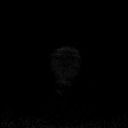
[im 16/79]
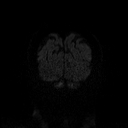
[im 32/79]
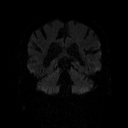
[im 47/79]
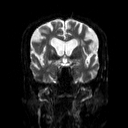
[im 63/79]
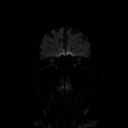
[im 79/79]
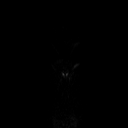

[Series 6: DWI · coronal · 5.0mm · 1.80mm/px · 3 of 40 slices shown (4 of 4)]
[im 1/40]
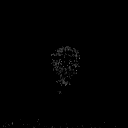
[im 20/40]
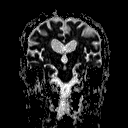
[im 40/40]
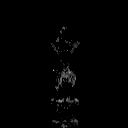

[Series 7: T2 · axial · 5.0mm · 0.60mm/px · z∈[-56,+97]mm · 2 of 24 slices shown (1 of 2)]
[im 1/24]
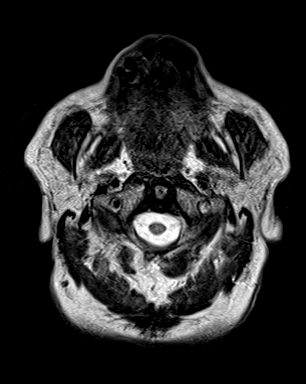
[im 24/24]
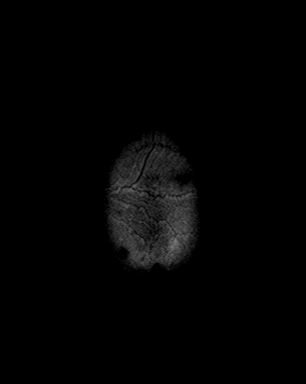

[Series 8: FLAIR · axial · 3.0mm · 0.45mm/px · z∈[-57,+99]mm · 3 of 35 slices shown]
[im 1/35]
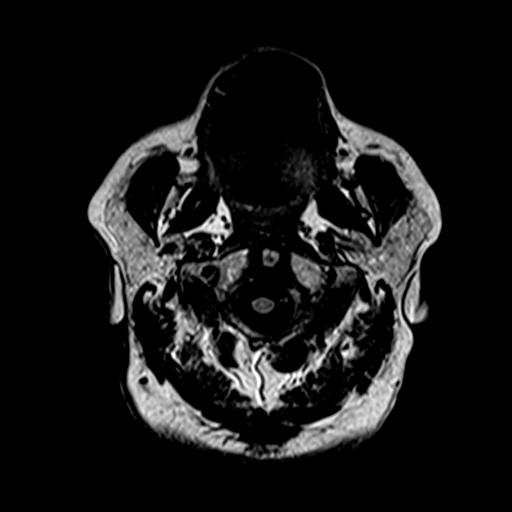
[im 18/35]
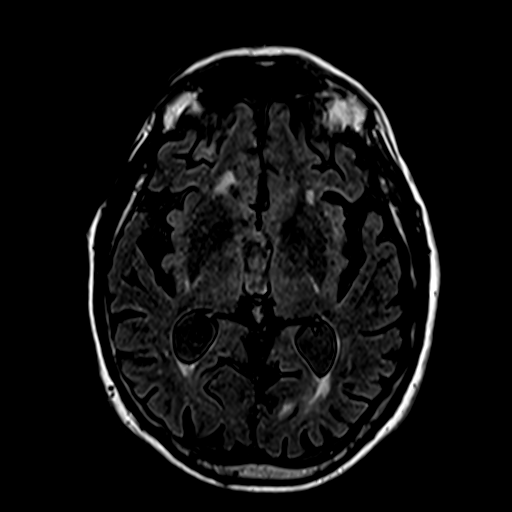
[im 35/35]
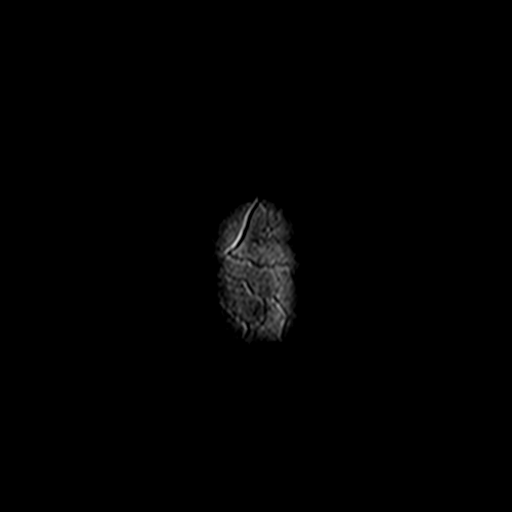

[Series 9: mip_images(sw) · axial · 40.0mm · 0.90mm/px · z∈[-39,+80]mm · 2 of 25 slices shown]
[im 1/25]
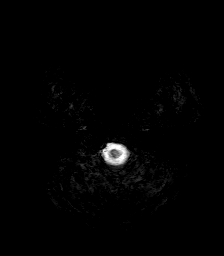
[im 25/25]
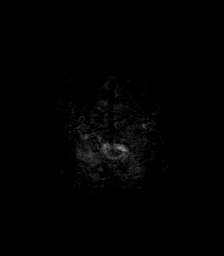

[Series 10: swi_images · axial · 5.0mm · 0.90mm/px · z∈[-56,+97]mm · 2 of 32 slices shown]
[im 1/32]
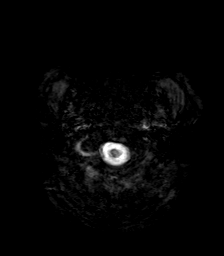
[im 32/32]
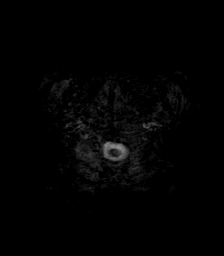

[Series 11: t1_mpr_tra · axial · 1.0mm · 0.75mm/px · z∈[-57,+100]mm · 12 of 160 slices shown]
[im 1/160]
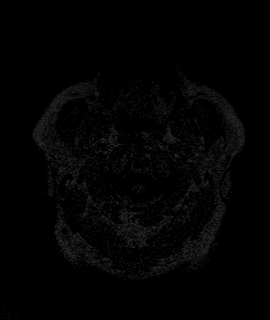
[im 15/160]
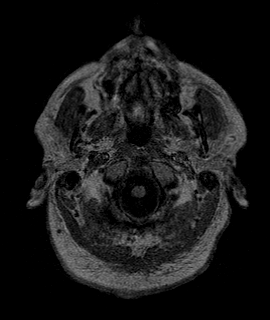
[im 29/160]
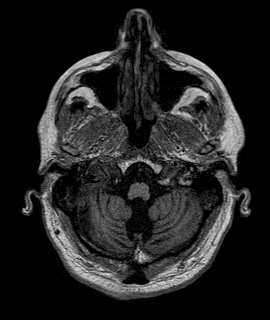
[im 44/160]
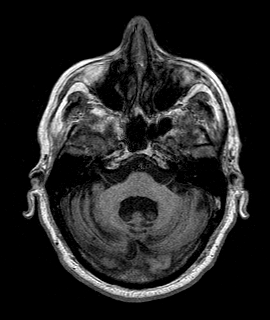
[im 58/160]
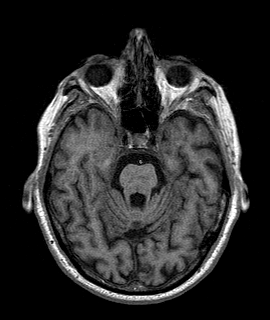
[im 73/160]
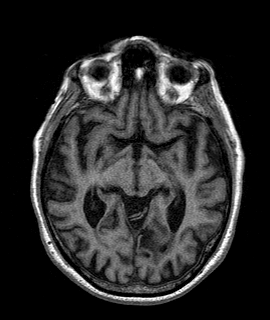
[im 87/160]
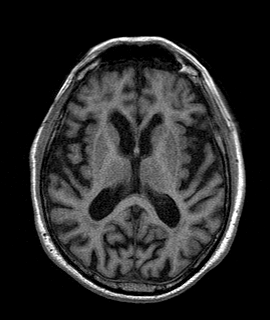
[im 102/160]
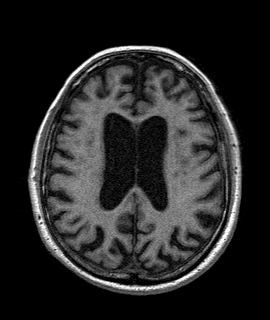
[im 116/160]
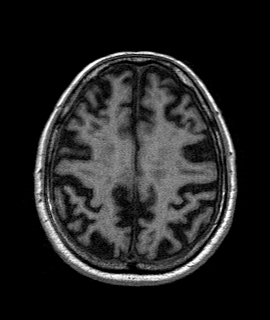
[im 131/160]
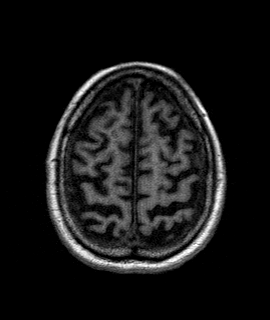
[im 145/160]
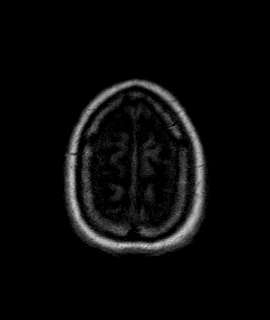
[im 160/160]
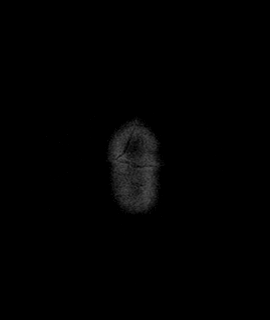

[Series 12: T2 · coronal · 5.0mm · 0.45mm/px · 2 of 30 slices shown (2 of 2)]
[im 1/30]
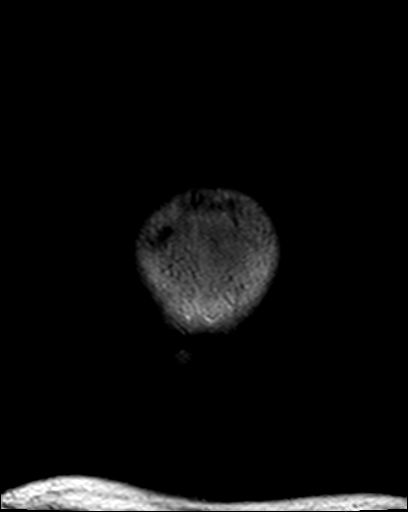
[im 30/30]
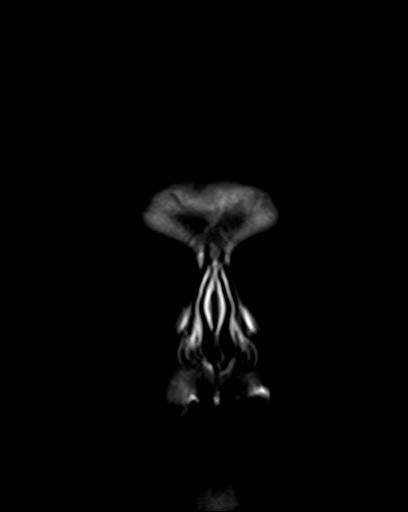

[48 of 48 positions shown; findings below may reference images not displayed]

FINDINGS: Brain: No acute infarction, hemorrhage, hydrocephalus, extra-axial
collection or mass lesion. Clear suprasellar cistern and
unremarkable chiasm. Moderate to advanced generalized atrophy.
Diffuse patchy FLAIR hyperintensity in the cerebral white matter,
pons and vermis consistent with small vessel ischemic injury.

Vascular: Major flow voids are preserved.

Skull and upper cervical spine: No evidence of marrow lesion.

Sinuses/Orbits: Cataract resection on the right. No compressive or
inflammatory changes to explain symptoms. Negative paranasal
sinuses. Partial bilateral mastoid opacification that is chronic
based on 03/23/2016 head CT.
IMPRESSION: 1. No acute or reversible finding.
2. Advanced generalized atrophy and moderate chronic microvascular
ischemic injury.

## 2017-11-14 DIAGNOSIS — D631 Anemia in chronic kidney disease: Secondary | ICD-10-CM | POA: Diagnosis not present

## 2017-11-14 DIAGNOSIS — N184 Chronic kidney disease, stage 4 (severe): Secondary | ICD-10-CM | POA: Diagnosis not present

## 2017-11-28 ENCOUNTER — Other Ambulatory Visit: Payer: Self-pay | Admitting: Cardiovascular Disease

## 2017-11-28 DIAGNOSIS — N184 Chronic kidney disease, stage 4 (severe): Secondary | ICD-10-CM | POA: Diagnosis not present

## 2017-11-28 DIAGNOSIS — D631 Anemia in chronic kidney disease: Secondary | ICD-10-CM | POA: Diagnosis not present

## 2017-12-06 DIAGNOSIS — I129 Hypertensive chronic kidney disease with stage 1 through stage 4 chronic kidney disease, or unspecified chronic kidney disease: Secondary | ICD-10-CM | POA: Diagnosis not present

## 2017-12-06 DIAGNOSIS — D631 Anemia in chronic kidney disease: Secondary | ICD-10-CM | POA: Diagnosis not present

## 2017-12-06 DIAGNOSIS — N184 Chronic kidney disease, stage 4 (severe): Secondary | ICD-10-CM | POA: Diagnosis not present

## 2017-12-06 DIAGNOSIS — E889 Metabolic disorder, unspecified: Secondary | ICD-10-CM | POA: Diagnosis not present

## 2017-12-06 DIAGNOSIS — M9080 Osteopathy in diseases classified elsewhere, unspecified site: Secondary | ICD-10-CM | POA: Diagnosis not present

## 2017-12-07 ENCOUNTER — Telehealth: Payer: Self-pay | Admitting: Cardiovascular Disease

## 2017-12-07 NOTE — Telephone Encounter (Signed)
Records received from [facility] on 12/07/17, Appt 12/27/17 @ 10:45AM. [place]

## 2017-12-12 DIAGNOSIS — D631 Anemia in chronic kidney disease: Secondary | ICD-10-CM | POA: Diagnosis not present

## 2017-12-12 DIAGNOSIS — N184 Chronic kidney disease, stage 4 (severe): Secondary | ICD-10-CM | POA: Diagnosis not present

## 2017-12-13 DIAGNOSIS — E782 Mixed hyperlipidemia: Secondary | ICD-10-CM | POA: Diagnosis not present

## 2017-12-14 ENCOUNTER — Ambulatory Visit (HOSPITAL_COMMUNITY): Payer: Medicare Other | Attending: Cardiovascular Disease

## 2017-12-14 ENCOUNTER — Other Ambulatory Visit: Payer: Self-pay

## 2017-12-14 DIAGNOSIS — I2510 Atherosclerotic heart disease of native coronary artery without angina pectoris: Secondary | ICD-10-CM | POA: Insufficient documentation

## 2017-12-14 DIAGNOSIS — I350 Nonrheumatic aortic (valve) stenosis: Secondary | ICD-10-CM

## 2017-12-14 DIAGNOSIS — I10 Essential (primary) hypertension: Secondary | ICD-10-CM | POA: Diagnosis not present

## 2017-12-14 DIAGNOSIS — E785 Hyperlipidemia, unspecified: Secondary | ICD-10-CM | POA: Diagnosis not present

## 2017-12-14 DIAGNOSIS — I739 Peripheral vascular disease, unspecified: Secondary | ICD-10-CM | POA: Insufficient documentation

## 2017-12-19 DIAGNOSIS — L821 Other seborrheic keratosis: Secondary | ICD-10-CM | POA: Diagnosis not present

## 2017-12-19 DIAGNOSIS — L570 Actinic keratosis: Secondary | ICD-10-CM | POA: Diagnosis not present

## 2017-12-20 ENCOUNTER — Ambulatory Visit: Payer: Self-pay | Admitting: Cardiovascular Disease

## 2017-12-25 DIAGNOSIS — D631 Anemia in chronic kidney disease: Secondary | ICD-10-CM | POA: Diagnosis not present

## 2017-12-25 DIAGNOSIS — N184 Chronic kidney disease, stage 4 (severe): Secondary | ICD-10-CM | POA: Diagnosis not present

## 2017-12-27 ENCOUNTER — Ambulatory Visit: Payer: Self-pay | Admitting: Cardiovascular Disease

## 2017-12-27 ENCOUNTER — Ambulatory Visit (INDEPENDENT_AMBULATORY_CARE_PROVIDER_SITE_OTHER): Payer: Medicare Other | Admitting: Cardiovascular Disease

## 2017-12-27 ENCOUNTER — Encounter: Payer: Self-pay | Admitting: Cardiovascular Disease

## 2017-12-27 VITALS — BP 128/66 | HR 73 | Ht 65.5 in | Wt 140.0 lb

## 2017-12-27 DIAGNOSIS — I2510 Atherosclerotic heart disease of native coronary artery without angina pectoris: Secondary | ICD-10-CM

## 2017-12-27 DIAGNOSIS — I6529 Occlusion and stenosis of unspecified carotid artery: Secondary | ICD-10-CM

## 2017-12-27 DIAGNOSIS — I10 Essential (primary) hypertension: Secondary | ICD-10-CM

## 2017-12-27 DIAGNOSIS — E785 Hyperlipidemia, unspecified: Secondary | ICD-10-CM | POA: Diagnosis not present

## 2017-12-27 DIAGNOSIS — I350 Nonrheumatic aortic (valve) stenosis: Secondary | ICD-10-CM | POA: Diagnosis not present

## 2017-12-27 NOTE — Assessment & Plan Note (Signed)
History of essential hypertension blood pressure measured at 128/66.  He is on amlodipine, and  hydralazin.

## 2017-12-27 NOTE — Assessment & Plan Note (Signed)
History of critical aortic stenosis status post aortic valve replacement by Dr. Servando Snare 06/14/2012 a model 3300 TFX bioprosthesis.  Normal LV systolic function with a well-functioning aortic bioprosthesis.

## 2017-12-27 NOTE — Assessment & Plan Note (Signed)
History of bilateral carotid endarterectomies and bilateral carotid stenting with duplex ultrasound showing moderately severe right ICA stenosis and moderate left we are following annually by duplex ultrasound.

## 2017-12-27 NOTE — Assessment & Plan Note (Signed)
History of coronary artery disease status post stenting of his LAD, RCA and OM branches in [place].  He had coronary artery bypass grafting x1 with a vein graft to his RCA by Dr. Servando Snare 06/14/2012 at the time of aortic valve replacement.

## 2017-12-27 NOTE — Assessment & Plan Note (Signed)
History of dyslipidemia on statin therapy followed by his PCP 

## 2017-12-27 NOTE — Progress Notes (Signed)
12/27/2017 Col[facility]   12/20/1926  [id number]  Primary Physician [facility]., MD Primary Cardiologist: Collin Harp MD FACP, [place], [facility]  HPI:  Collin Pearson is a 84 y.o.  thin appearing married Caucasian male who I last saw in the office  12/16/2016.Marland Kitchen He is accompanied by his wife [facility] today. He has a history of CAD status post stenting of his LAD, RCA, and OM branches [place]. He has had bilateral carotid endarterectomies at [facility] [place] and [place] carotid [place]ing [place]. I last saw him in the office 11/20/15. His other problems include hypertension, hyperlipidemia, diabetes and chronic renal insufficiency. He had critical aortic stenosis with chest pain and dyspnea. Because of worsening carotid Dopplers I angiogrammed him May 18, 2012 revealing moderate in-stent restenosis within the right carotid stent. He ultimately underwent aortic valve replacement with an [facility] bioprosthesis as well as SVG to RCA by [facility] June 14, 2012. His followup 2D echo was normal. Tomorrow is his last day of cardiac rehab and he has recuperated nicely. His creatinines have remained stable. His other problems include hypertension, hyperlipidemia, and non-insulin-requiring diabetes. His most recent carotid Doppler performed in February revealed stable velocities on the right correlating to 50-60% in-stent restenosis. Since I saw him 12 months ago he he remains completely stable. He denies chest pain or shortness of breath.he had a recent 2-D echo showed normal systolic function with a well functioning aortic bioprosthesis. Recent carotid Doppler showed progression of disease in his right carotid stent probably in the 80-90% range. He is on aspirin Plavix and remains neurologically asymptomatic. His major complaint is of pain in his right second toe with a nonhealing ulcer probably related to  PAD. His serum creatinines are elevated making intervention somewhat problematic.he has been seeing the wound care center has been getting hyperbaric therapy. His wound is slowly healing. He also has had some bursitis and cellulitis in the right leg and is getting physical therapy.[facility] was admitted to [facility] 02/03/15 for approximate one week because of profound bradycardia and hypotension. This was thought to be secondary to amiodarone and beta blocker therapy both which were just discontinued. He did have worsening of his chronic renal insufficiency which improved over time back to her baseline of approximately 2. Since I saw him in the office a month ago he's gained 3-4 pounds. He does admit to dietary indiscretion regarding salt. We talked about the importance of salt restriction. Since I saw him in the office 11/20/15 he had developed critical limb ischemia in his left leg and had a left above-the-knee amputation performed by Dr. Donnetta Pearson 02/08/16. His stump has since healed. He did have physical therapy postoperatively. He has been fitted with a prosthesis which he wears at home but primarily he is wheelchair-bound. He denies chest pain or shortness of breath. Recent carotid Dopplers performed 12/06/16 revealed stable right internal carotid "in-stent restenosis".  Recent 2D echo 12/16/2017 revealed a well-functioning aortic bioprosthesis.  He is essentially wheelchair-bound.  He does complain of some right lower extremity discomfort probably related to peripheral neuropathy but denies chest pain or shortness of breath.     Current Meds  Medication Sig  . amLODipine (NORVASC) 10 MG tablet TAKE 1 TABLET BY MOUTH  DAILY  . aspirin EC 81 MG tablet Take 81 mg by mouth 2 (two) times daily.  Marland Kitchen atorvastatin (LIPITOR) 40 MG tablet TAKE 1 TABLET  BY MOUTH AT  BEDTIME  . BD INSULIN SYRINGE ULTRAFINE 31G X 5/16" 1 ML MISC Reported on 02/02/2016  . clotrimazole-betamethasone (LOTRISONE) cream Apply 1  application topically daily as needed (dry skin, itching). Reported on 02/02/2016  . fish oil-omega-3 fatty acids 1000 MG capsule Take 1 g by mouth 3 (three) times daily. Reported on 02/02/2016  . furosemide (LASIX) 80 MG tablet TAKE ONE TABLET DAILY. TAKE AN ADDITIONAL ONE-HALF (1/2) TABLET EVERY OTHER DAY IN THE EVENING IF WEIGHT GOES ABOVE 134 POUNDS  . gabapentin (NEURONTIN) 300 MG capsule Take 1 capsule by mouth daily at 10 pm.  . glipiZIDE (GLUCOTROL) 5 MG tablet Take 5 mg by mouth 3 (three) times daily.   . hydrALAZINE (APRESOLINE) 100 MG tablet Take 1 tablet (100 mg total) by mouth every 8 (eight) hours.  . insulin detemir (LEVEMIR) 100 UNIT/ML injection Inject 0.07 mLs (7 Units total) into the skin at bedtime. (Patient taking differently: Inject 8 Units into the skin 2 (two) times daily. )  . Insulin Lispro (HUMALOG [place]) Inject into the skin. Sliding scale.  . Iron TABS Take 160 mg by mouth 2 (two) times daily.   . isosorbide mononitrate (IMDUR) 60 MG 24 hr tablet Take 1 tablet (60 mg total) by mouth daily.  . Multiple Vitamin (MULTIVITAMIN WITH MINERALS) TABS Take 1 tablet by mouth daily.  . ONE TOUCH ULTRA TEST test strip Reported on 02/02/2016  . pantoprazole (PROTONIX) 40 MG tablet Take 40 mg by mouth daily.  . Tamsulosin HCl (FLOMAX) 0.4 MG CAPS Take 0.4 mg by mouth. One tablet in the morning and 1/2 tablet in the evening  . Vitamin D, Ergocalciferol, (DRISDOL) 50000 units CAPS capsule Take 50,000 Units by mouth every 7 (seven) days. Reported on 02/02/2016  . vitamin E (VITAMIN E) 400 UNIT capsule Take 400 Units by mouth daily. Reported on 02/02/2016     No Known Allergies  Social History   Socioeconomic History  . Marital status: Married    Spouse name: Not on file  . Number of children: 4  . Years of education: masters  . Highest education level: Not on file  Occupational History  . Not on file  Social Needs  . Financial resource strain: Not on file  . Food insecurity:     Worry: Not on file    Inability: Not on file  . Transportation needs:    Medical: Not on file    Non-medical: Not on file  Tobacco Use  . Smoking status: Never Smoker  . Smokeless tobacco: Never Used  Substance and Sexual Activity  . Alcohol use: No    Alcohol/week: 0.0 oz  . Drug use: No  . Sexual activity: Not Currently  Lifestyle  . Physical activity:    Days per week: Not on file    Minutes per session: Not on file  . Stress: Not on file  Relationships  . Social connections:    Talks on phone: Not on file    Gets together: Not on file    Attends religious service: Not on file    Active member of club or organization: Not on file    Attends meetings of clubs or organizations: Not on file    Relationship status: Not on file  . Intimate partner violence:    Fear of current or ex partner: Not on file    Emotionally abused: Not on file    Physically abused: Not on file    Forced sexual activity: Not  on file  Other Topics Concern  . Not on file  Social History Narrative   Lives with wife in a one story home.  Has 4 children.  Retired Theme park manager.  Education: 2 masters, Designer, jewellery.     Review of Systems: General: negative for chills, fever, night sweats or weight changes.  Cardiovascular: negative for chest pain, dyspnea on exertion, edema, orthopnea, palpitations, paroxysmal nocturnal dyspnea or shortness of breath Dermatological: negative for rash Respiratory: negative for cough or wheezing Urologic: negative for hematuria Abdominal: negative for nausea, vomiting, diarrhea, bright red blood per rectum, melena, or hematemesis Neurologic: negative for visual changes, syncope, or dizziness All other systems reviewed and are otherwise negative except as noted above.    Blood pressure 128/66, pulse 73, height 5' 5.5" (1.664 m), weight 140 lb (63.5 kg).  General appearance: alert and no distress Neck: no adenopathy, no JVD, supple, symmetrical, trachea midline, thyroid not  enlarged, symmetric, no tenderness/mass/nodules and Lateral carotid bruits Lungs: clear to auscultation bilaterally Heart: regular rate and rhythm, S1, S2 normal, no murmur, click, rub or gallop Extremities: Left below the knee amputation Pulses: Diminished pedal pulses Skin: Skin color, texture, turgor normal. No rashes or lesions Neurologic: Alert and oriented X 3, normal strength and tone. Normal symmetric reflexes. Normal coordination and gait  EKG normal sinus rhythm at 73 with bifascicular block, right bundle branch block and left anterior fascicular block.  I personally reviewed this EKG  ASSESSMENT AND PLAN:   PVD, with hx of bilateral CEA at [facility], and previous bilateral carotid stenting, with high grade ISR of Rt. carotid 05/18/12 History of bilateral carotid endarterectomies and bilateral carotid stenting with duplex ultrasound showing moderately severe right ICA stenosis and moderate left we are following annually by duplex ultrasound.  HTN (hypertension) History of essential hypertension blood pressure measured at 128/66.  He is on amlodipine, and  hydralazin.   Dyslipidemia History of dyslipidemia on statin therapy followed by his PCP  CAD, patent stent to LAD and mid RCA with occluded OM2 branch   History of coronary artery disease status post stenting of his LAD, RCA and OM branches in [place].  He had coronary artery bypass grafting x1 with a vein graft to his RCA by Dr. Servando Snare 06/14/2012 at the time of aortic valve replacement.  AS (aortic stenosis), critical stenosis History of critical aortic stenosis status post aortic valve replacement by Dr. Servando Snare 06/14/2012 a model 3300 Pearson bioprosthesis.  Normal LV systolic function with a well-functioning aortic bioprosthesis.      Collin Harp MD FACP,FACC,FAHA, [facility] 12/27/2017 12:09 PM

## 2017-12-27 NOTE — Patient Instructions (Signed)
Medication Instructions: Your physician recommends that you continue on your current medications as directed. Please refer to the Current Medication list given to you today.   Testing/Procedures:  In 1 year: Your physician has requested that you have an echocardiogram. Echocardiography is a painless test that uses sound waves to create images of your heart. It provides your doctor with information about the size and shape of your heart and how well your heart's chambers and valves are working. This procedure takes approximately one hour. There are no restrictions for this procedure.  Your physician has requested that you have a carotid duplex. This test is an ultrasound of the carotid arteries in your neck. It looks at blood flow through these arteries that supply the brain with blood. Allow one hour for this exam. There are no restrictions or special instructions.  Follow-Up: Your physician wants you to follow-up in: 1 year with Dr. Gwenlyn Found. You will receive a reminder letter in the mail two months in advance. If you don't receive a letter, please call our office to schedule the follow-up appointment.  If you need a refill on your cardiac medications before your next appointment, please call your pharmacy.

## 2017-12-29 DIAGNOSIS — E782 Mixed hyperlipidemia: Secondary | ICD-10-CM | POA: Diagnosis not present

## 2017-12-29 DIAGNOSIS — I130 Hypertensive heart and chronic kidney disease with heart failure and stage 1 through stage 4 chronic kidney disease, or unspecified chronic kidney disease: Secondary | ICD-10-CM | POA: Diagnosis not present

## 2017-12-29 DIAGNOSIS — Z794 Long term (current) use of insulin: Secondary | ICD-10-CM | POA: Diagnosis not present

## 2017-12-29 DIAGNOSIS — I5022 Chronic systolic (congestive) heart failure: Secondary | ICD-10-CM | POA: Diagnosis not present

## 2017-12-29 DIAGNOSIS — E1165 Type 2 diabetes mellitus with hyperglycemia: Secondary | ICD-10-CM | POA: Diagnosis not present

## 2017-12-29 DIAGNOSIS — I2510 Atherosclerotic heart disease of native coronary artery without angina pectoris: Secondary | ICD-10-CM | POA: Diagnosis not present

## 2018-01-05 ENCOUNTER — Encounter (HOSPITAL_COMMUNITY): Payer: Self-pay

## 2018-01-16 DIAGNOSIS — I10 Essential (primary) hypertension: Secondary | ICD-10-CM | POA: Diagnosis not present

## 2018-01-16 DIAGNOSIS — N184 Chronic kidney disease, stage 4 (severe): Secondary | ICD-10-CM | POA: Diagnosis not present

## 2018-01-16 DIAGNOSIS — Z89612 Acquired absence of left leg above knee: Secondary | ICD-10-CM | POA: Diagnosis not present

## 2018-01-16 DIAGNOSIS — E1140 Type 2 diabetes mellitus with diabetic neuropathy, unspecified: Secondary | ICD-10-CM | POA: Diagnosis not present

## 2018-01-16 DIAGNOSIS — E1151 Type 2 diabetes mellitus with diabetic peripheral angiopathy without gangrene: Secondary | ICD-10-CM | POA: Diagnosis not present

## 2018-01-24 DIAGNOSIS — D631 Anemia in chronic kidney disease: Secondary | ICD-10-CM | POA: Diagnosis not present

## 2018-01-24 DIAGNOSIS — N184 Chronic kidney disease, stage 4 (severe): Secondary | ICD-10-CM | POA: Diagnosis not present

## 2018-02-02 ENCOUNTER — Ambulatory Visit (HOSPITAL_COMMUNITY): Payer: Medicare Other

## 2018-02-05 ENCOUNTER — Ambulatory Visit (HOSPITAL_COMMUNITY)
Admission: RE | Admit: 2018-02-05 | Discharge: 2018-02-05 | Disposition: A | Discharge status: Home / Self Care | Payer: Medicare Other | Source: Ambulatory Visit | Attending: Cardiology | Admitting: Cardiology

## 2018-02-05 DIAGNOSIS — E1151 Type 2 diabetes mellitus with diabetic peripheral angiopathy without gangrene: Secondary | ICD-10-CM | POA: Diagnosis not present

## 2018-02-05 DIAGNOSIS — I252 Old myocardial infarction: Secondary | ICD-10-CM | POA: Insufficient documentation

## 2018-02-05 DIAGNOSIS — I2510 Atherosclerotic heart disease of native coronary artery without angina pectoris: Secondary | ICD-10-CM | POA: Diagnosis not present

## 2018-02-05 DIAGNOSIS — I10 Essential (primary) hypertension: Secondary | ICD-10-CM | POA: Diagnosis not present

## 2018-02-05 DIAGNOSIS — I6523 Occlusion and stenosis of bilateral carotid arteries: Secondary | ICD-10-CM | POA: Insufficient documentation

## 2018-02-05 DIAGNOSIS — I6529 Occlusion and stenosis of unspecified carotid artery: Secondary | ICD-10-CM | POA: Diagnosis not present

## 2018-02-07 ENCOUNTER — Other Ambulatory Visit: Payer: Self-pay | Admitting: *Deleted

## 2018-02-07 DIAGNOSIS — I6529 Occlusion and stenosis of unspecified carotid artery: Secondary | ICD-10-CM

## 2018-02-07 NOTE — Progress Notes (Signed)
vas 

## 2018-02-21 DIAGNOSIS — N184 Chronic kidney disease, stage 4 (severe): Secondary | ICD-10-CM | POA: Diagnosis not present

## 2018-02-21 DIAGNOSIS — D631 Anemia in chronic kidney disease: Secondary | ICD-10-CM | POA: Diagnosis not present

## 2018-03-05 ENCOUNTER — Other Ambulatory Visit: Payer: Self-pay

## 2018-03-05 DIAGNOSIS — Z79899 Other long term (current) drug therapy: Secondary | ICD-10-CM

## 2018-03-05 MED ORDER — FUROSEMIDE 80 MG PO TABS: ORAL_TABLET | ORAL | 2 refills | Status: AC

## 2018-03-21 DIAGNOSIS — N184 Chronic kidney disease, stage 4 (severe): Secondary | ICD-10-CM | POA: Diagnosis not present

## 2018-03-21 DIAGNOSIS — D631 Anemia in chronic kidney disease: Secondary | ICD-10-CM | POA: Diagnosis not present

## 2018-03-30 DIAGNOSIS — E1165 Type 2 diabetes mellitus with hyperglycemia: Secondary | ICD-10-CM | POA: Diagnosis not present

## 2018-03-30 DIAGNOSIS — Z794 Long term (current) use of insulin: Secondary | ICD-10-CM | POA: Diagnosis not present

## 2018-03-30 DIAGNOSIS — E782 Mixed hyperlipidemia: Secondary | ICD-10-CM | POA: Diagnosis not present

## 2018-03-30 DIAGNOSIS — I130 Hypertensive heart and chronic kidney disease with heart failure and stage 1 through stage 4 chronic kidney disease, or unspecified chronic kidney disease: Secondary | ICD-10-CM | POA: Diagnosis not present

## 2018-03-30 DIAGNOSIS — I2510 Atherosclerotic heart disease of native coronary artery without angina pectoris: Secondary | ICD-10-CM | POA: Diagnosis not present

## 2018-04-18 ENCOUNTER — Other Ambulatory Visit: Payer: Self-pay | Admitting: Cardiovascular Disease

## 2018-04-19 DIAGNOSIS — N184 Chronic kidney disease, stage 4 (severe): Secondary | ICD-10-CM | POA: Diagnosis not present

## 2018-04-19 DIAGNOSIS — D631 Anemia in chronic kidney disease: Secondary | ICD-10-CM | POA: Diagnosis not present

## 2018-05-17 DIAGNOSIS — D631 Anemia in chronic kidney disease: Secondary | ICD-10-CM | POA: Diagnosis not present

## 2018-05-17 DIAGNOSIS — N184 Chronic kidney disease, stage 4 (severe): Secondary | ICD-10-CM | POA: Diagnosis not present

## 2018-06-04 DIAGNOSIS — E1140 Type 2 diabetes mellitus with diabetic neuropathy, unspecified: Secondary | ICD-10-CM | POA: Diagnosis not present

## 2018-06-04 DIAGNOSIS — Z23 Encounter for immunization: Secondary | ICD-10-CM | POA: Diagnosis not present

## 2018-06-04 DIAGNOSIS — N184 Chronic kidney disease, stage 4 (severe): Secondary | ICD-10-CM | POA: Diagnosis not present

## 2018-06-04 DIAGNOSIS — E1151 Type 2 diabetes mellitus with diabetic peripheral angiopathy without gangrene: Secondary | ICD-10-CM | POA: Diagnosis not present

## 2018-06-04 DIAGNOSIS — I10 Essential (primary) hypertension: Secondary | ICD-10-CM | POA: Diagnosis not present

## 2018-06-04 DIAGNOSIS — Z9181 History of falling: Secondary | ICD-10-CM | POA: Diagnosis not present

## 2018-06-14 DIAGNOSIS — D631 Anemia in chronic kidney disease: Secondary | ICD-10-CM | POA: Diagnosis not present

## 2018-06-14 DIAGNOSIS — N184 Chronic kidney disease, stage 4 (severe): Secondary | ICD-10-CM | POA: Diagnosis not present

## 2018-07-10 ENCOUNTER — Other Ambulatory Visit: Payer: Self-pay | Admitting: Cardiovascular Disease

## 2018-07-10 DIAGNOSIS — D631 Anemia in chronic kidney disease: Secondary | ICD-10-CM | POA: Diagnosis not present

## 2018-07-10 DIAGNOSIS — N184 Chronic kidney disease, stage 4 (severe): Secondary | ICD-10-CM | POA: Diagnosis not present

## 2018-07-10 NOTE — Telephone Encounter (Signed)
Rx request sent to pharmacy.  

## 2018-08-10 DIAGNOSIS — N184 Chronic kidney disease, stage 4 (severe): Secondary | ICD-10-CM | POA: Diagnosis not present

## 2018-08-10 DIAGNOSIS — D631 Anemia in chronic kidney disease: Secondary | ICD-10-CM | POA: Diagnosis not present

## 2018-08-23 DIAGNOSIS — M542 Cervicalgia: Secondary | ICD-10-CM | POA: Diagnosis not present

## 2018-08-23 DIAGNOSIS — Z6820 Body mass index (BMI) 20.0-20.9, adult: Secondary | ICD-10-CM | POA: Diagnosis not present

## 2018-08-26 DIAGNOSIS — M5412 Radiculopathy, cervical region: Secondary | ICD-10-CM | POA: Diagnosis not present

## 2018-09-05 DIAGNOSIS — M5412 Radiculopathy, cervical region: Secondary | ICD-10-CM | POA: Diagnosis not present

## 2018-09-05 DIAGNOSIS — M7540 Impingement syndrome of unspecified shoulder: Secondary | ICD-10-CM | POA: Diagnosis not present

## 2018-09-07 DIAGNOSIS — N184 Chronic kidney disease, stage 4 (severe): Secondary | ICD-10-CM | POA: Diagnosis not present

## 2018-09-07 DIAGNOSIS — D631 Anemia in chronic kidney disease: Secondary | ICD-10-CM | POA: Diagnosis not present

## 2018-09-08 DIAGNOSIS — M542 Cervicalgia: Secondary | ICD-10-CM | POA: Diagnosis not present

## 2018-09-08 DIAGNOSIS — M5011 Cervical disc disorder with radiculopathy,  high cervical region: Secondary | ICD-10-CM | POA: Diagnosis not present

## 2018-09-12 DIAGNOSIS — M5412 Radiculopathy, cervical region: Secondary | ICD-10-CM | POA: Diagnosis not present

## 2018-09-18 ENCOUNTER — Encounter: Payer: Self-pay | Admitting: Cardiovascular Disease

## 2018-09-18 ENCOUNTER — Telehealth: Payer: Self-pay

## 2018-09-18 ENCOUNTER — Ambulatory Visit: Payer: Medicare Other | Admitting: Cardiovascular Disease

## 2018-09-18 DIAGNOSIS — E785 Hyperlipidemia, unspecified: Secondary | ICD-10-CM | POA: Diagnosis not present

## 2018-09-18 DIAGNOSIS — I10 Essential (primary) hypertension: Secondary | ICD-10-CM

## 2018-09-18 DIAGNOSIS — I739 Peripheral vascular disease, unspecified: Secondary | ICD-10-CM

## 2018-09-18 DIAGNOSIS — I2510 Atherosclerotic heart disease of native coronary artery without angina pectoris: Secondary | ICD-10-CM | POA: Diagnosis not present

## 2018-09-18 NOTE — Patient Instructions (Signed)
Medication Instructions:  NONE If you need a refill on your cardiac medications before your next appointment, please call your pharmacy.   Lab work: NONE If you have labs (blood work) drawn today and your tests are completely normal, you will receive your results only by: Marland Kitchen MyChart Message (if you have MyChart) OR . A paper copy in the mail If you have any lab test that is abnormal or we need to change your treatment, we will call you to review the results.  Testing/Procedures: NONE  Follow-Up: At [facility], you and your health needs are our priority.  As part of our continuing mission to provide you with exceptional heart care, we have created designated Provider Care Teams.  These Care Teams include your primary Cardiologist (physician) and Advanced Practice Providers (APPs -  Physician Assistants and Nurse Practitioners) who all work together to provide you with the care you need, when you need it. . You MAY SCHEDULE a follow up appointment AS NEEDED.  You may see Dr. Gwenlyn Found or one of the following Advanced Practice Providers on your designated Care Team:   . Kerin Ransom, [place] . Almyra Deforest, PA-C . Fabian Sharp, PA-C . Jory Sims, DNP . Rosaria Ferries, PA-C . Roby Lofts, PA-C . Sande Rives, PA-C  Any Other Special Instructions Will Be Listed Below (If Applicable). YOU HAVE BEEN CLEARED AT MODERATE RISK FROM A CARDIAC STANDPOINT FOR YOUR UPCOMING PROCEDURE

## 2018-09-18 NOTE — Assessment & Plan Note (Signed)
History of CAD status post remote stenting of his LAD, RCA and OM branches in [place].  He had CABG/bioprosthetic AVR by Dr. Servando Snare 06/14/2012 with a vein to the RCA.  His most recent 2D echo performed 12/14/2017 revealed normal LV systolic function with a normally functioning aortic bioprosthesis.

## 2018-09-18 NOTE — Assessment & Plan Note (Addendum)
History of peripheral arterial disease status post bilateral carotid endarterectomies at [facility] in [place] remotely as well as bilateral carotid stenting in [facility].  We are following his carotid Dopplers by duplex ultrasound which does show moderate bilateral ICA "in-stent restenosis".

## 2018-09-18 NOTE — Telephone Encounter (Signed)
   [facility] Pre-operative Risk Assessment    Request for surgical clearance:  1. What type of surgery is being performed? Cervical Fusion and other procedures as indicated.   2. When is this surgery scheduled? TBD   3. What type of clearance is required (medical clearance vs. Pharmacy clearance to hold med vs. Both)? Both  4. Are there any medications that need to be held prior to surgery and how long? Aspirin    5. Practice name and name of physician performing surgery? [facility] -Dr. Joya Salm   6. What is your office phone number [phone]    7.   What is your office fax number [phone]  8.   Anesthesia type (None, local, MAC, general) ?  unknown   Ena Dawley 09/18/2018, 9:28 AM  _________________________________________________________________   (provider comments below)

## 2018-09-18 NOTE — Progress Notes (Signed)
09/18/2018 Collin Pearson   April 25, 1927  [id number]  Primary Physician Helen Hashimoto., MD Primary Cardiologist: Lorretta Harp MD FACP, [place], [place], [place]  HPI:  Collin Pearson is a 85 y.o.  thin appearing married Caucasian male who I last saw in the office  12/27/2017.Marland KitchenHe is accompanied by his wife Collin Pearson today.He has a history of CAD status post stenting of his LAD, RCA, and OM branches in [place], [place]. He has had bilateral carotid endarterectomies at [facility] in [place] and bilateral carotid stenting in [place], [place]. I last saw him in the office 11/20/15. His other problems include hypertension, hyperlipidemia, diabetes and chronic renal insufficiency. He had critical aortic stenosis with chest pain and dyspnea. Because of worsening carotid Dopplers I angiogrammed him May 18, 2012 revealing moderate in-stent restenosis within the right carotid stent. He ultimately underwent aortic valve replacement with an [place] TFX bioprosthesis as well as SVG to RCA by Dr. Ceasar Mons June 14, 2012. His followup 2D echo was normal. Tomorrow is his last day of cardiac rehab and he has recuperated nicely. His creatinines have remained stable. His other problems include hypertension, hyperlipidemia, and non-insulin-requiring diabetes. His most recent carotid Doppler performed in February revealed stable velocities on the right correlating to 50-60% in-stent restenosis. Since I saw him 12 months ago he he remains completely stable. He denies chest pain or shortness of breath.he had a recent 2-D echo showed normal systolic function with a well functioning aortic bioprosthesis. Recent carotid Doppler showed progression of disease in his right carotid stent probably in the 80-90% range. He is on aspirin Plavix and remains neurologically asymptomatic. His major complaint is of pain in his right second toe with a nonhealing ulcer probably related to  PAD. His serum creatinines are elevated making intervention somewhat problematic.he has been seeing the wound care center has been getting hyperbaric therapy. His wound is slowly healing. He also has had some bursitis and cellulitis in the right leg and is getting physical therapy.[name] was admitted to [facility] 02/03/15 for approximate one week because of profound bradycardia and hypotension. This was thought to be secondary to amiodarone and beta blocker therapy both which were just discontinued. He did have worsening of his chronic renal insufficiency which improved over time back to her baseline of approximately 2. Since I saw him in the office a month ago he's gained 3-4 pounds. He does admit to dietary indiscretion regarding salt. We talked about the importance of salt restriction. Since I saw him in the office 11/20/15 he had developed critical limb ischemia in his left leg and had a left above-the-knee amputation performed by Dr. Donnetta Hutching 02/08/16. His stump has since healed. He did have physical therapy postoperatively. He has been fitted with a prosthesis which he wears at home but primarily he is wheelchair-bound. He denies chest pain or shortness of breath. Recent carotid Dopplers performed 12/06/16 revealed stable right internal carotid "in-stent restenosis".  Recent 2D echo 12/16/2017 revealed a well-functioning aortic bioprosthesis.  He is essentially wheelchair-bound.  He does complain of some right lower extremity discomfort probably related to peripheral neuropathy but denies chest pain or shortness of breath.   Since I saw him in the office in May of last year he has developed some cervical radiculopathy from bone spurs and apparently needs cervical orthopedic intervention.  He is at moderately elevated risk from a cardiac and vascular point of view now 7 years post bypass grafting.  His 2D echo however does show normal LV function with a well-functioning aortic bioprosthesis with mild to  moderate in-stent restenosis within both carotid stents.  He denies chest pain or shortness of breath.   Current Meds  Medication Sig  . amLODipine (NORVASC) 10 MG tablet TAKE 1 TABLET BY MOUTH  DAILY  . aspirin EC 81 MG tablet Take 81 mg by mouth 2 (two) times daily.  Marland Kitchen atorvastatin (LIPITOR) 40 MG tablet TAKE 1 TABLET BY MOUTH AT  BEDTIME  . BD INSULIN SYRINGE ULTRAFINE 31G X 5/16" 1 ML MISC Reported on 02/02/2016  . clotrimazole-betamethasone (LOTRISONE) cream Apply 1 application topically daily as needed (dry skin, itching). Reported on 02/02/2016  . fish oil-omega-3 fatty acids 1000 MG capsule Take 1 g by mouth 3 (three) times daily. Reported on 02/02/2016  . furosemide (LASIX) 80 MG tablet TAKE ONE TABLET by mouth DAILY. TAKE AN ADDITIONAL ONE-HALF (1/2) TABLET EVERY OTHER DAY IN THE EVENING IF WEIGHT GOES ABOVE 134 POUNDS  . gabapentin (NEURONTIN) 300 MG capsule Take 1 capsule by mouth daily at 10 pm.  . glipiZIDE (GLUCOTROL) 5 MG tablet Take 5 mg by mouth 3 (three) times daily.   . hydrALAZINE (APRESOLINE) 100 MG tablet Take 1 tablet (100 mg total) by mouth every 8 (eight) hours.  . insulin detemir (LEVEMIR) 100 UNIT/ML injection Inject 0.07 mLs (7 Units total) into the skin at bedtime. (Patient taking differently: Inject 8 Units into the skin 2 (two) times daily. )  . Insulin Lispro (HUMALOG [place]) Inject into the skin. Sliding scale.  . Iron TABS Take 160 mg by mouth 2 (two) times daily.   . isosorbide mononitrate (IMDUR) 60 MG 24 hr tablet Take 1 tablet (60 mg total) by mouth daily.  . Multiple Vitamin (MULTIVITAMIN WITH MINERALS) TABS Take 1 tablet by mouth daily.  . ONE TOUCH ULTRA TEST test strip Reported on 02/02/2016  . pantoprazole (PROTONIX) 40 MG tablet Take 40 mg by mouth daily.  . Tamsulosin HCl (FLOMAX) 0.4 MG CAPS Take 0.4 mg by mouth. One tablet in the morning and 1/2 tablet in the evening  . traMADol (ULTRAM) 50 MG tablet Take 50 mg by mouth at bedtime.  . Vitamin D,  Ergocalciferol, (DRISDOL) 50000 units CAPS capsule Take 50,000 Units by mouth every 7 (seven) days. Reported on 02/02/2016  . vitamin E (VITAMIN E) 400 UNIT capsule Take 400 Units by mouth daily. Reported on 02/02/2016     No Known Allergies  Social History   Socioeconomic History  . Marital status: Married    Spouse name: Not on file  . Number of children: 4  . Years of education: masters  . Highest education level: Not on file  Occupational History  . Not on file  Social Needs  . Financial resource strain: Not on file  . Food insecurity:    Worry: Not on file    Inability: Not on file  . Transportation needs:    Medical: Not on file    Non-medical: Not on file  Tobacco Use  . Smoking status: Never Smoker  . Smokeless tobacco: Never Used  Substance and Sexual Activity  . Alcohol use: No    Alcohol/week: 0.0 standard drinks  . Drug use: No  . Sexual activity: Not Currently  Lifestyle  . Physical activity:    Days per week: Not on file    Minutes per session: Not on file  . Stress: Not on file  Relationships  . Social connections:  Talks on phone: Not on file    Gets together: Not on file    Attends religious service: Not on file    Active member of club or organization: Not on file    Attends meetings of clubs or organizations: Not on file    Relationship status: Not on file  . Intimate partner violence:    Fear of current or ex partner: Not on file    Emotionally abused: Not on file    Physically abused: Not on file    Forced sexual activity: Not on file  Other Topics Concern  . Not on file  Social History Narrative   Lives with wife in a one story home.  Has 4 children.  Retired Theme park manager.  Education: 2 masters, Designer, jewellery.     Review of Systems: General: negative for chills, fever, night sweats or weight changes.  Cardiovascular: negative for chest pain, dyspnea on exertion, edema, orthopnea, palpitations, paroxysmal nocturnal dyspnea or shortness of  breath Dermatological: negative for rash Respiratory: negative for cough or wheezing Urologic: negative for hematuria Abdominal: negative for nausea, vomiting, diarrhea, bright red blood per rectum, melena, or hematemesis Neurologic: negative for visual changes, syncope, or dizziness All other systems reviewed and are otherwise negative except as noted above.    Blood pressure (!) 182/82, pulse 73.  General appearance: alert and no distress Neck: no adenopathy, no JVD, supple, symmetrical, trachea midline, thyroid not enlarged, symmetric, no tenderness/mass/nodules and Soft bilateral carotid bruits Lungs: clear to auscultation bilaterally Heart: regular rate and rhythm, S1, S2 normal, no murmur, click, rub or gallop Extremities: extremities normal, atraumatic, no cyanosis or edema Pulses: Left BKA Skin: Skin color, texture, turgor normal. No rashes or lesions Neurologic: Alert and oriented X 3, normal strength and tone. Normal symmetric reflexes. Normal coordination and gait  EKG sinus rhythm at 73 with right bundle branch block and inferior Q waves.  I personally reviewed this EKG.  ASSESSMENT AND PLAN:   PVD, with hx of bilateral CEA at [facility] and previous bilateral carotid stenting, with high grade ISR of Rt. carotid 05/18/12 History of peripheral arterial disease status post bilateral carotid endarterectomies at [facility] in [place] remotely as well as bilateral carotid stenting in [facility].  We are following his carotid Dopplers by duplex ultrasound which does show moderate bilateral ICA "in-stent restenosis".  HTN (hypertension) History of essential hypertension her blood pressure measured today at 182/82.  This is higher than his usual blood pressure measurements.  He is on amlodipine and hydralazine.  Dyslipidemia Severe dyslipidemia on statin therapy with lipid profile profile performed 06/04/2018 revealing cholesterol 146, LDL 44 and HDL  33.  CAD, patent stent to LAD and mid RCA with occluded OM2 branch   History of CAD status post remote stenting of his LAD, RCA and OM branches in [place].  He had CABG/bioprosthetic AVR by Dr. Servando Snare 06/14/2012 with a vein to the RCA.  His most recent 2D echo performed 12/14/2017 revealed normal LV systolic function with a normally functioning aortic bioprosthesis.      Lorretta Harp MD FACP,FACC,FAHA, [facility] 09/18/2018 11:57 AM

## 2018-09-18 NOTE — Assessment & Plan Note (Signed)
History of essential hypertension her blood pressure measured today at 182/82.  This is higher than his usual blood pressure measurements.  He is on amlodipine and hydralazine.

## 2018-09-18 NOTE — Assessment & Plan Note (Signed)
Severe dyslipidemia on statin therapy with lipid profile profile performed 06/04/2018 revealing cholesterol 146, LDL 44 and HDL 33.

## 2018-09-20 NOTE — Telephone Encounter (Signed)
Dr. Gwenlyn Found I see you cleared Mr Collin Pearson for surgery on OV but can he hold his ASA for 5 days?  Please send results to pre-op pool thanks.

## 2018-09-20 NOTE — Telephone Encounter (Signed)
Okay to hold aspirin for his procedure.

## 2018-09-21 NOTE — Telephone Encounter (Signed)
   Primary Cardiologist: No primary care provider on file.  Chart reviewed as part of pre-operative protocol coverage. Patient was contacted 09/21/2018 in reference to pre-operative risk assessment for pending surgery as outlined below.  Collin Pearson was last seen on 09/18/18 by Dr. Gwenlyn Found.At that visit, Dr. Gwenlyn Found states he is at at moderately elevated cardiac risk for surgery.   Per Dr. Gwenlyn Found, [place] to hold ASA for 5 days prior to the procedure.   Therefore, based on ACC/AHA guidelines, the patient would be at acceptable risk for the planned procedure without further cardiovascular testing.   I will route this recommendation to the requesting party via Epic fax function and remove from pre-op pool.  Please call with questions.  Tami Lin Alicen Donalson, PA 09/21/2018, 9:43 AM

## 2018-09-24 DIAGNOSIS — M5030 Other cervical disc degeneration, unspecified cervical region: Secondary | ICD-10-CM | POA: Diagnosis not present

## 2018-10-11 DIAGNOSIS — D631 Anemia in chronic kidney disease: Secondary | ICD-10-CM | POA: Diagnosis not present

## 2018-10-11 DIAGNOSIS — N184 Chronic kidney disease, stage 4 (severe): Secondary | ICD-10-CM | POA: Diagnosis not present

## 2018-10-19 DIAGNOSIS — M5033 Other cervical disc degeneration, cervicothoracic region: Secondary | ICD-10-CM | POA: Diagnosis not present

## 2018-10-19 DIAGNOSIS — M542 Cervicalgia: Secondary | ICD-10-CM | POA: Diagnosis not present

## 2018-10-19 DIAGNOSIS — M5412 Radiculopathy, cervical region: Secondary | ICD-10-CM | POA: Diagnosis not present

## 2018-10-19 DIAGNOSIS — M5013 Cervical disc disorder with radiculopathy, cervicothoracic region: Secondary | ICD-10-CM | POA: Diagnosis not present

## 2018-10-29 DIAGNOSIS — E1151 Type 2 diabetes mellitus with diabetic peripheral angiopathy without gangrene: Secondary | ICD-10-CM | POA: Diagnosis not present

## 2018-10-29 DIAGNOSIS — M5030 Other cervical disc degeneration, unspecified cervical region: Secondary | ICD-10-CM | POA: Diagnosis not present

## 2018-10-29 DIAGNOSIS — M47812 Spondylosis without myelopathy or radiculopathy, cervical region: Secondary | ICD-10-CM | POA: Diagnosis not present

## 2018-10-29 DIAGNOSIS — Z794 Long term (current) use of insulin: Secondary | ICD-10-CM | POA: Diagnosis not present

## 2018-11-04 DIAGNOSIS — J22 Unspecified acute lower respiratory infection: Secondary | ICD-10-CM | POA: Diagnosis not present

## 2018-11-07 ENCOUNTER — Telehealth: Payer: Self-pay | Admitting: *Deleted

## 2018-11-07 NOTE — Telephone Encounter (Signed)
Call from patient's caregiver/Linda. C/O worsening condition of wounds on toes right foot that was observed by PA in this office last year. She states she "really just noticed the one between his toes" She is concerned and wants to be seen by Dr. Donnetta Hutching. Agreeable to see NP on Tuesday 11/13/2018. I instructed her to clean and dry completely and cover with dry gauze and put gauze between toes and no tight shoes. Instructed social space restrictions do not come if fever, etc.

## 2018-11-07 NOTE — Telephone Encounter (Signed)
Attempted return to call to [place] giver. No answer no voice mail.

## 2018-11-12 ENCOUNTER — Telehealth (HOSPITAL_COMMUNITY): Payer: Self-pay | Admitting: Rehabilitation

## 2018-11-12 NOTE — Telephone Encounter (Signed)
The above patient or their representative was contacted and gave the following answers to these questions:         Do you have any of the following symptoms? No  Fever                    Cough                   Shortness of breath  Do  you have any of the following other symptoms? No   muscle pain         vomiting,        diarrhea        rash         weakness        red eye        abdominal pain         bruising          bruising or bleeding              joint pain           severe headache    Have you been in contact with someone who was or has been sick in the past 2 weeks? No  Yes                 Unsure                         Unable to assess   Does the person that you were in contact with have any of the following symptoms? No  Cough         shortness of breath           muscle pain         vomiting,            diarrhea            rash            weakness           fever            red eye           abdominal pain           bruising  or  bleeding                joint pain                severe headache               Have you  or someone you have been in contact with traveled internationally in th last month? No        If yes, which countries?   Have you  or someone you have been in contact with traveled outside [place] in th last month?   Yes      If yes, which state and city? Daughter from [place], [place] OR ACTION PLAN FOR THIS PATIENT:

## 2018-11-13 ENCOUNTER — Ambulatory Visit: Payer: Self-pay | Admitting: Family

## 2018-11-13 ENCOUNTER — Ambulatory Visit (INDEPENDENT_AMBULATORY_CARE_PROVIDER_SITE_OTHER): Payer: Medicare Other | Admitting: Vascular Surgery

## 2018-11-13 ENCOUNTER — Other Ambulatory Visit: Payer: Self-pay

## 2018-11-13 ENCOUNTER — Encounter: Payer: Self-pay | Admitting: Vascular Surgery

## 2018-11-13 VITALS — BP 139/66 | HR 79 | Temp 98.0°F | Resp 18 | Ht 65.5 in | Wt 140.0 lb

## 2018-11-13 DIAGNOSIS — I739 Peripheral vascular disease, unspecified: Secondary | ICD-10-CM | POA: Diagnosis not present

## 2018-11-13 DIAGNOSIS — Z89612 Acquired absence of left leg above knee: Secondary | ICD-10-CM

## 2018-11-13 NOTE — Progress Notes (Signed)
[facility] [place]  Patient name: Collin Pearson MRN: [id number] DOB: 04-24-1927 Sex: male  REASON FOR VISIT: Evaluate ulceration between the third and fourth toe  HPI: Collin Pearson is a 85 y.o. male here today with his wife for evaluation of a wound between his third and fourth toe.  He continues to be quite alert.  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  He is certainly had continued failing health.  He is status post left above-knee amputation for nonsalvageable critical limb ischemia on his left leg several years ago.  He is in a motorized scooter but does report that he can assist to transfer from bed to chair pivoting on his right foot.  He does report some chronic pain in his right foot but also has chronic pain in his left shoulder due to cervical spine issues.  His wife noted ulceration between his third and fourth toe and is here today for evaluation of this.  He has had superficial ulcerations in the past and is healed these.  Past Medical History:  Diagnosis Date  . Anginal pain ([place])   . Anxiety   . Arthritis   . AS (aortic stenosis)    status post aortic valve replacement with an [employer] model 14 T. fracture bioprosthesis  . CKD (chronic kidney disease) stage 4, GFR 15-29 ml/min (HCC)   . Clotting disorder ([place])   . Coronary artery disease   . Critical lower limb ischemia   . Diabetes mellitus   . GERD (gastroesophageal reflux disease)   . Heart murmur   . Hypertension   . PAF (paroxysmal atrial fibrillation), s/p cabg 06/18/2012  . Peripheral vascular disease ([place])   . PVD (peripheral vascular disease), with hsx of bilateral carotid endarterectomies at [facility], and bilateral carotid stenting 05/18/2012    Family History  Problem Relation Age of Onset  . Heart disease Mother 11  . Heart disease Father 81  . CAD Brother   . Heart disease Maternal Uncle   .  Heart disease Paternal Uncle   . Heart disease Maternal Grandfather 68    SOCIAL HISTORY: Social History   Tobacco Use  . Smoking status: Never Smoker  . Smokeless tobacco: Never Used  Substance Use Topics  . Alcohol use: No    Alcohol/week: 0.0 standard drinks    No Known Allergies  Current Outpatient Medications  Medication Sig Dispense Refill  . amLODipine (NORVASC) 10 MG tablet TAKE 1 TABLET BY MOUTH  DAILY 90 tablet 3  . aspirin EC 81 MG tablet Take 81 mg by mouth 2 (two) times daily.    Marland Kitchen atorvastatin (LIPITOR) 40 MG tablet TAKE 1 TABLET BY MOUTH AT  BEDTIME 90 tablet 1  . BD INSULIN SYRINGE ULTRAFINE 31G X 5/16" 1 ML MISC Reported on 02/02/2016    . clotrimazole-betamethasone (LOTRISONE) cream Apply 1 application topically daily as needed (dry skin, itching). Reported on 02/02/2016    . fish oil-omega-3 fatty acids 1000 MG capsule Take 1 g by mouth 3 (three) times daily. Reported on 02/02/2016    . furosemide (LASIX) 80 MG tablet TAKE ONE TABLET by mouth DAILY. TAKE AN ADDITIONAL ONE-HALF (1/2) TABLET EVERY OTHER DAY IN THE EVENING IF WEIGHT GOES ABOVE 134 POUNDS 135 tablet 2  . gabapentin (NEURONTIN) 300 MG capsule Take 1 capsule by mouth daily at 10 pm.    . glipiZIDE (GLUCOTROL) 5 MG tablet Take 5  mg by mouth 3 (three) times daily.     . hydrALAZINE (APRESOLINE) 100 MG tablet Take 1 tablet (100 mg total) by mouth every 8 (eight) hours. 90 tablet 0  . insulin detemir (LEVEMIR) 100 UNIT/ML injection Inject 0.07 mLs (7 Units total) into the skin at bedtime. (Patient taking differently: Inject 8 Units into the skin 2 (two) times daily. ) 10 mL 11  . Insulin Lispro (HUMALOG [place]) Inject into the skin. Sliding scale.    . Iron TABS Take 160 mg by mouth 2 (two) times daily.     . isosorbide mononitrate (IMDUR) 60 MG 24 hr tablet Take 1 tablet (60 mg total) by mouth daily. 90 tablet 1  . Multiple Vitamin (MULTIVITAMIN WITH MINERALS) TABS Take 1 tablet by mouth daily.    . ONE TOUCH  ULTRA TEST test strip Reported on 02/02/2016    . pantoprazole (PROTONIX) 40 MG tablet Take 40 mg by mouth daily.    . Tamsulosin HCl (FLOMAX) 0.4 MG CAPS Take 0.4 mg by mouth. One tablet in the morning and 1/2 tablet in the evening    . traMADol (ULTRAM) 50 MG tablet Take 50 mg by mouth at bedtime.    . Vitamin D, Ergocalciferol, (DRISDOL) 50000 units CAPS capsule Take 50,000 Units by mouth every 7 (seven) days. Reported on 02/02/2016    . vitamin E (VITAMIN E) 400 UNIT capsule Take 400 Units by mouth daily. Reported on 02/02/2016     No current facility-administered medications for this visit.     REVIEW OF SYSTEMS:  [X]  denotes positive finding, [ ]  denotes negative finding Cardiac  Comments:  Chest pain or chest pressure:    Shortness of breath upon exertion:    Short of breath when lying flat:    Irregular heart rhythm:        Vascular    Pain in calf, thigh, or hip brought on by ambulation:    Pain in feet at night that wakes you up from your sleep:     Blood clot in your veins:    Leg swelling:           PHYSICAL EXAM: Vitals:   11/13/18 1020  BP: 139/66  Pulse: 79  Resp: 18  Temp: 98 F (36.7 C)  SpO2: 97%  Weight: 140 lb (63.5 kg)  Height: 5' 5.5" (1.664 m)    GENERAL: The patient is a well-nourished male, in no acute distress. The vital signs are documented above. CARDIOVASCULAR: Popliteal and distal pulses on the right absent PULMONARY: There is good air exchange  MUSCULOSKELETAL: There are no major deformities or cyanosis. NEUROLOGIC: No focal weakness or paresthesias are detected. SKIN: 1 cm ulceration between the third and fourth toe on the medial aspect of the third toe with no surrounding erythema. PSYCHIATRIC: The patient has a normal affect.  DATA:  None  MEDICAL ISSUES: I discussed options with the patient and his wife present.  He certainly is at risk for limb loss due to his ulceration.  Certainly is not a candidate for aggressive vascularization.   He does have some contracture in his right knee and it transfers only from bed to chair.  I explained that the indication for amputation would be intolerable pain or progressive tissue loss.  He reports that he is able to tolerate the discomfort in his foot currently.  He will notify us if this progresses or if he has any progression of his tissue loss.  He will continue with the  Silvadene ointment between his third and fourth toe with a small gauze between these as well.    Rosetta Posner, MD FACS [facility] Tel [phone] Pager [phone]

## 2019-01-01 DIAGNOSIS — Z79899 Other long term (current) drug therapy: Secondary | ICD-10-CM | POA: Diagnosis not present

## 2019-01-01 DIAGNOSIS — Z6820 Body mass index (BMI) 20.0-20.9, adult: Secondary | ICD-10-CM | POA: Diagnosis not present

## 2019-01-01 DIAGNOSIS — L97502 Non-pressure chronic ulcer of other part of unspecified foot with fat layer exposed: Secondary | ICD-10-CM | POA: Diagnosis not present

## 2019-01-08 DIAGNOSIS — N184 Chronic kidney disease, stage 4 (severe): Secondary | ICD-10-CM | POA: Diagnosis not present

## 2019-01-08 DIAGNOSIS — L97512 Non-pressure chronic ulcer of other part of right foot with fat layer exposed: Secondary | ICD-10-CM | POA: Diagnosis not present

## 2019-01-08 DIAGNOSIS — I4891 Unspecified atrial fibrillation: Secondary | ICD-10-CM | POA: Diagnosis not present

## 2019-01-08 DIAGNOSIS — I252 Old myocardial infarction: Secondary | ICD-10-CM | POA: Diagnosis not present

## 2019-01-08 DIAGNOSIS — I2510 Atherosclerotic heart disease of native coronary artery without angina pectoris: Secondary | ICD-10-CM | POA: Diagnosis not present

## 2019-01-08 DIAGNOSIS — I129 Hypertensive chronic kidney disease with stage 1 through stage 4 chronic kidney disease, or unspecified chronic kidney disease: Secondary | ICD-10-CM | POA: Diagnosis not present

## 2019-01-08 DIAGNOSIS — E11621 Type 2 diabetes mellitus with foot ulcer: Secondary | ICD-10-CM | POA: Diagnosis not present

## 2019-01-08 DIAGNOSIS — I96 Gangrene, not elsewhere classified: Secondary | ICD-10-CM | POA: Diagnosis not present

## 2019-01-08 DIAGNOSIS — E1122 Type 2 diabetes mellitus with diabetic chronic kidney disease: Secondary | ICD-10-CM | POA: Diagnosis not present

## 2019-01-08 DIAGNOSIS — E1152 Type 2 diabetes mellitus with diabetic peripheral angiopathy with gangrene: Secondary | ICD-10-CM | POA: Diagnosis not present

## 2019-01-10 DIAGNOSIS — E11621 Type 2 diabetes mellitus with foot ulcer: Secondary | ICD-10-CM | POA: Diagnosis not present

## 2019-01-10 DIAGNOSIS — M85871 Other specified disorders of bone density and structure, right ankle and foot: Secondary | ICD-10-CM | POA: Diagnosis not present

## 2019-01-10 DIAGNOSIS — L97519 Non-pressure chronic ulcer of other part of right foot with unspecified severity: Secondary | ICD-10-CM | POA: Diagnosis not present

## 2019-01-10 DIAGNOSIS — M2011 Hallux valgus (acquired), right foot: Secondary | ICD-10-CM | POA: Diagnosis not present

## 2019-01-10 DIAGNOSIS — M19071 Primary osteoarthritis, right ankle and foot: Secondary | ICD-10-CM | POA: Diagnosis not present

## 2019-01-14 DIAGNOSIS — I96 Gangrene, not elsewhere classified: Secondary | ICD-10-CM | POA: Diagnosis not present

## 2019-01-14 DIAGNOSIS — L97512 Non-pressure chronic ulcer of other part of right foot with fat layer exposed: Secondary | ICD-10-CM | POA: Diagnosis not present

## 2019-01-14 DIAGNOSIS — E1152 Type 2 diabetes mellitus with diabetic peripheral angiopathy with gangrene: Secondary | ICD-10-CM | POA: Diagnosis not present

## 2019-01-14 DIAGNOSIS — E11621 Type 2 diabetes mellitus with foot ulcer: Secondary | ICD-10-CM | POA: Diagnosis not present

## 2019-01-17 DIAGNOSIS — I4891 Unspecified atrial fibrillation: Secondary | ICD-10-CM | POA: Diagnosis not present

## 2019-01-17 DIAGNOSIS — Z9181 History of falling: Secondary | ICD-10-CM | POA: Diagnosis not present

## 2019-01-17 DIAGNOSIS — E1151 Type 2 diabetes mellitus with diabetic peripheral angiopathy without gangrene: Secondary | ICD-10-CM | POA: Diagnosis not present

## 2019-01-17 DIAGNOSIS — E1140 Type 2 diabetes mellitus with diabetic neuropathy, unspecified: Secondary | ICD-10-CM | POA: Diagnosis not present

## 2019-01-17 DIAGNOSIS — I5032 Chronic diastolic (congestive) heart failure: Secondary | ICD-10-CM | POA: Diagnosis not present

## 2019-01-17 DIAGNOSIS — Z794 Long term (current) use of insulin: Secondary | ICD-10-CM | POA: Diagnosis not present

## 2019-01-17 DIAGNOSIS — Z89512 Acquired absence of left leg below knee: Secondary | ICD-10-CM | POA: Diagnosis not present

## 2019-01-17 DIAGNOSIS — Z951 Presence of aortocoronary bypass graft: Secondary | ICD-10-CM | POA: Diagnosis not present

## 2019-01-17 DIAGNOSIS — E785 Hyperlipidemia, unspecified: Secondary | ICD-10-CM | POA: Diagnosis not present

## 2019-01-17 DIAGNOSIS — I130 Hypertensive heart and chronic kidney disease with heart failure and stage 1 through stage 4 chronic kidney disease, or unspecified chronic kidney disease: Secondary | ICD-10-CM | POA: Diagnosis not present

## 2019-01-17 DIAGNOSIS — E1122 Type 2 diabetes mellitus with diabetic chronic kidney disease: Secondary | ICD-10-CM | POA: Diagnosis not present

## 2019-01-17 DIAGNOSIS — E11621 Type 2 diabetes mellitus with foot ulcer: Secondary | ICD-10-CM | POA: Diagnosis not present

## 2019-01-17 DIAGNOSIS — I350 Nonrheumatic aortic (valve) stenosis: Secondary | ICD-10-CM | POA: Diagnosis not present

## 2019-01-17 DIAGNOSIS — L97519 Non-pressure chronic ulcer of other part of right foot with unspecified severity: Secondary | ICD-10-CM | POA: Diagnosis not present

## 2019-01-17 DIAGNOSIS — D631 Anemia in chronic kidney disease: Secondary | ICD-10-CM | POA: Diagnosis not present

## 2019-01-17 DIAGNOSIS — Z4800 Encounter for change or removal of nonsurgical wound dressing: Secondary | ICD-10-CM | POA: Diagnosis not present

## 2019-01-17 DIAGNOSIS — N184 Chronic kidney disease, stage 4 (severe): Secondary | ICD-10-CM | POA: Diagnosis not present

## 2019-01-17 DIAGNOSIS — I2510 Atherosclerotic heart disease of native coronary artery without angina pectoris: Secondary | ICD-10-CM | POA: Diagnosis not present

## 2019-01-21 DIAGNOSIS — E1152 Type 2 diabetes mellitus with diabetic peripheral angiopathy with gangrene: Secondary | ICD-10-CM | POA: Diagnosis not present

## 2019-01-21 DIAGNOSIS — L97512 Non-pressure chronic ulcer of other part of right foot with fat layer exposed: Secondary | ICD-10-CM | POA: Diagnosis not present

## 2019-01-21 DIAGNOSIS — I96 Gangrene, not elsewhere classified: Secondary | ICD-10-CM | POA: Diagnosis not present

## 2019-01-21 DIAGNOSIS — E11621 Type 2 diabetes mellitus with foot ulcer: Secondary | ICD-10-CM | POA: Diagnosis not present

## 2019-01-23 ENCOUNTER — Other Ambulatory Visit: Payer: Self-pay | Admitting: Cardiovascular Disease

## 2019-01-23 DIAGNOSIS — I130 Hypertensive heart and chronic kidney disease with heart failure and stage 1 through stage 4 chronic kidney disease, or unspecified chronic kidney disease: Secondary | ICD-10-CM | POA: Diagnosis not present

## 2019-01-23 DIAGNOSIS — Z89512 Acquired absence of left leg below knee: Secondary | ICD-10-CM | POA: Diagnosis not present

## 2019-01-23 DIAGNOSIS — Z9181 History of falling: Secondary | ICD-10-CM | POA: Diagnosis not present

## 2019-01-23 DIAGNOSIS — Z951 Presence of aortocoronary bypass graft: Secondary | ICD-10-CM | POA: Diagnosis not present

## 2019-01-23 DIAGNOSIS — E785 Hyperlipidemia, unspecified: Secondary | ICD-10-CM | POA: Diagnosis not present

## 2019-01-23 DIAGNOSIS — E1140 Type 2 diabetes mellitus with diabetic neuropathy, unspecified: Secondary | ICD-10-CM | POA: Diagnosis not present

## 2019-01-23 DIAGNOSIS — E11621 Type 2 diabetes mellitus with foot ulcer: Secondary | ICD-10-CM | POA: Diagnosis not present

## 2019-01-23 DIAGNOSIS — I350 Nonrheumatic aortic (valve) stenosis: Secondary | ICD-10-CM | POA: Diagnosis not present

## 2019-01-23 DIAGNOSIS — E1122 Type 2 diabetes mellitus with diabetic chronic kidney disease: Secondary | ICD-10-CM | POA: Diagnosis not present

## 2019-01-23 DIAGNOSIS — L97519 Non-pressure chronic ulcer of other part of right foot with unspecified severity: Secondary | ICD-10-CM | POA: Diagnosis not present

## 2019-01-23 DIAGNOSIS — Z4800 Encounter for change or removal of nonsurgical wound dressing: Secondary | ICD-10-CM | POA: Diagnosis not present

## 2019-01-23 DIAGNOSIS — D631 Anemia in chronic kidney disease: Secondary | ICD-10-CM | POA: Diagnosis not present

## 2019-01-23 DIAGNOSIS — E1151 Type 2 diabetes mellitus with diabetic peripheral angiopathy without gangrene: Secondary | ICD-10-CM | POA: Diagnosis not present

## 2019-01-23 DIAGNOSIS — I5032 Chronic diastolic (congestive) heart failure: Secondary | ICD-10-CM | POA: Diagnosis not present

## 2019-01-23 DIAGNOSIS — N184 Chronic kidney disease, stage 4 (severe): Secondary | ICD-10-CM | POA: Diagnosis not present

## 2019-01-23 DIAGNOSIS — Z794 Long term (current) use of insulin: Secondary | ICD-10-CM | POA: Diagnosis not present

## 2019-01-23 DIAGNOSIS — I2510 Atherosclerotic heart disease of native coronary artery without angina pectoris: Secondary | ICD-10-CM | POA: Diagnosis not present

## 2019-01-23 DIAGNOSIS — I4891 Unspecified atrial fibrillation: Secondary | ICD-10-CM | POA: Diagnosis not present

## 2019-01-24 DIAGNOSIS — G63 Polyneuropathy in diseases classified elsewhere: Secondary | ICD-10-CM | POA: Diagnosis not present

## 2019-01-24 DIAGNOSIS — Z749 Problem related to care provider dependency, unspecified: Secondary | ICD-10-CM | POA: Diagnosis not present

## 2019-01-24 DIAGNOSIS — E119 Type 2 diabetes mellitus without complications: Secondary | ICD-10-CM | POA: Diagnosis not present

## 2019-01-24 DIAGNOSIS — Z89612 Acquired absence of left leg above knee: Secondary | ICD-10-CM | POA: Diagnosis not present

## 2019-01-25 DIAGNOSIS — N184 Chronic kidney disease, stage 4 (severe): Secondary | ICD-10-CM | POA: Diagnosis not present

## 2019-01-25 DIAGNOSIS — Z9181 History of falling: Secondary | ICD-10-CM | POA: Diagnosis not present

## 2019-01-25 DIAGNOSIS — Z89512 Acquired absence of left leg below knee: Secondary | ICD-10-CM | POA: Diagnosis not present

## 2019-01-25 DIAGNOSIS — D631 Anemia in chronic kidney disease: Secondary | ICD-10-CM | POA: Diagnosis not present

## 2019-01-25 DIAGNOSIS — I130 Hypertensive heart and chronic kidney disease with heart failure and stage 1 through stage 4 chronic kidney disease, or unspecified chronic kidney disease: Secondary | ICD-10-CM | POA: Diagnosis not present

## 2019-01-25 DIAGNOSIS — E1122 Type 2 diabetes mellitus with diabetic chronic kidney disease: Secondary | ICD-10-CM | POA: Diagnosis not present

## 2019-01-25 DIAGNOSIS — I350 Nonrheumatic aortic (valve) stenosis: Secondary | ICD-10-CM | POA: Diagnosis not present

## 2019-01-25 DIAGNOSIS — E1151 Type 2 diabetes mellitus with diabetic peripheral angiopathy without gangrene: Secondary | ICD-10-CM | POA: Diagnosis not present

## 2019-01-25 DIAGNOSIS — Z794 Long term (current) use of insulin: Secondary | ICD-10-CM | POA: Diagnosis not present

## 2019-01-25 DIAGNOSIS — Z951 Presence of aortocoronary bypass graft: Secondary | ICD-10-CM | POA: Diagnosis not present

## 2019-01-25 DIAGNOSIS — E1140 Type 2 diabetes mellitus with diabetic neuropathy, unspecified: Secondary | ICD-10-CM | POA: Diagnosis not present

## 2019-01-25 DIAGNOSIS — Z4800 Encounter for change or removal of nonsurgical wound dressing: Secondary | ICD-10-CM | POA: Diagnosis not present

## 2019-01-25 DIAGNOSIS — E11621 Type 2 diabetes mellitus with foot ulcer: Secondary | ICD-10-CM | POA: Diagnosis not present

## 2019-01-25 DIAGNOSIS — E785 Hyperlipidemia, unspecified: Secondary | ICD-10-CM | POA: Diagnosis not present

## 2019-01-25 DIAGNOSIS — I4891 Unspecified atrial fibrillation: Secondary | ICD-10-CM | POA: Diagnosis not present

## 2019-01-25 DIAGNOSIS — I5032 Chronic diastolic (congestive) heart failure: Secondary | ICD-10-CM | POA: Diagnosis not present

## 2019-01-25 DIAGNOSIS — I2510 Atherosclerotic heart disease of native coronary artery without angina pectoris: Secondary | ICD-10-CM | POA: Diagnosis not present

## 2019-01-25 DIAGNOSIS — L97519 Non-pressure chronic ulcer of other part of right foot with unspecified severity: Secondary | ICD-10-CM | POA: Diagnosis not present

## 2019-01-28 DIAGNOSIS — E1152 Type 2 diabetes mellitus with diabetic peripheral angiopathy with gangrene: Secondary | ICD-10-CM | POA: Diagnosis not present

## 2019-01-28 DIAGNOSIS — E11621 Type 2 diabetes mellitus with foot ulcer: Secondary | ICD-10-CM | POA: Diagnosis not present

## 2019-01-28 DIAGNOSIS — I70235 Atherosclerosis of native arteries of right leg with ulceration of other part of foot: Secondary | ICD-10-CM | POA: Diagnosis not present

## 2019-01-28 DIAGNOSIS — L97519 Non-pressure chronic ulcer of other part of right foot with unspecified severity: Secondary | ICD-10-CM | POA: Diagnosis not present

## 2019-01-28 DIAGNOSIS — I96 Gangrene, not elsewhere classified: Secondary | ICD-10-CM | POA: Diagnosis not present

## 2019-01-30 DIAGNOSIS — I5032 Chronic diastolic (congestive) heart failure: Secondary | ICD-10-CM | POA: Diagnosis not present

## 2019-01-30 DIAGNOSIS — Z89512 Acquired absence of left leg below knee: Secondary | ICD-10-CM | POA: Diagnosis not present

## 2019-01-30 DIAGNOSIS — E785 Hyperlipidemia, unspecified: Secondary | ICD-10-CM | POA: Diagnosis not present

## 2019-01-30 DIAGNOSIS — Z951 Presence of aortocoronary bypass graft: Secondary | ICD-10-CM | POA: Diagnosis not present

## 2019-01-30 DIAGNOSIS — I350 Nonrheumatic aortic (valve) stenosis: Secondary | ICD-10-CM | POA: Diagnosis not present

## 2019-01-30 DIAGNOSIS — I130 Hypertensive heart and chronic kidney disease with heart failure and stage 1 through stage 4 chronic kidney disease, or unspecified chronic kidney disease: Secondary | ICD-10-CM | POA: Diagnosis not present

## 2019-01-30 DIAGNOSIS — D631 Anemia in chronic kidney disease: Secondary | ICD-10-CM | POA: Diagnosis not present

## 2019-01-30 DIAGNOSIS — E1151 Type 2 diabetes mellitus with diabetic peripheral angiopathy without gangrene: Secondary | ICD-10-CM | POA: Diagnosis not present

## 2019-01-30 DIAGNOSIS — L97519 Non-pressure chronic ulcer of other part of right foot with unspecified severity: Secondary | ICD-10-CM | POA: Diagnosis not present

## 2019-01-30 DIAGNOSIS — Z9181 History of falling: Secondary | ICD-10-CM | POA: Diagnosis not present

## 2019-01-30 DIAGNOSIS — N184 Chronic kidney disease, stage 4 (severe): Secondary | ICD-10-CM | POA: Diagnosis not present

## 2019-01-30 DIAGNOSIS — I4891 Unspecified atrial fibrillation: Secondary | ICD-10-CM | POA: Diagnosis not present

## 2019-01-30 DIAGNOSIS — I2510 Atherosclerotic heart disease of native coronary artery without angina pectoris: Secondary | ICD-10-CM | POA: Diagnosis not present

## 2019-01-30 DIAGNOSIS — Z794 Long term (current) use of insulin: Secondary | ICD-10-CM | POA: Diagnosis not present

## 2019-01-30 DIAGNOSIS — Z4800 Encounter for change or removal of nonsurgical wound dressing: Secondary | ICD-10-CM | POA: Diagnosis not present

## 2019-01-30 DIAGNOSIS — E1122 Type 2 diabetes mellitus with diabetic chronic kidney disease: Secondary | ICD-10-CM | POA: Diagnosis not present

## 2019-01-30 DIAGNOSIS — E11621 Type 2 diabetes mellitus with foot ulcer: Secondary | ICD-10-CM | POA: Diagnosis not present

## 2019-01-30 DIAGNOSIS — E1140 Type 2 diabetes mellitus with diabetic neuropathy, unspecified: Secondary | ICD-10-CM | POA: Diagnosis not present

## 2019-02-04 DIAGNOSIS — I96 Gangrene, not elsewhere classified: Secondary | ICD-10-CM | POA: Diagnosis not present

## 2019-02-04 DIAGNOSIS — E11621 Type 2 diabetes mellitus with foot ulcer: Secondary | ICD-10-CM | POA: Diagnosis not present

## 2019-02-04 DIAGNOSIS — L97516 Non-pressure chronic ulcer of other part of right foot with bone involvement without evidence of necrosis: Secondary | ICD-10-CM | POA: Diagnosis not present

## 2019-02-04 DIAGNOSIS — E1152 Type 2 diabetes mellitus with diabetic peripheral angiopathy with gangrene: Secondary | ICD-10-CM | POA: Diagnosis not present

## 2019-02-04 DIAGNOSIS — I70235 Atherosclerosis of native arteries of right leg with ulceration of other part of foot: Secondary | ICD-10-CM | POA: Diagnosis not present

## 2019-02-06 ENCOUNTER — Other Ambulatory Visit (HOSPITAL_COMMUNITY): Payer: Medicare Other

## 2019-02-06 ENCOUNTER — Ambulatory Visit (HOSPITAL_COMMUNITY): Payer: Medicare Other

## 2019-02-06 DIAGNOSIS — E11621 Type 2 diabetes mellitus with foot ulcer: Secondary | ICD-10-CM | POA: Diagnosis not present

## 2019-02-08 DIAGNOSIS — E1140 Type 2 diabetes mellitus with diabetic neuropathy, unspecified: Secondary | ICD-10-CM | POA: Diagnosis not present

## 2019-02-08 DIAGNOSIS — Z9181 History of falling: Secondary | ICD-10-CM | POA: Diagnosis not present

## 2019-02-08 DIAGNOSIS — I2510 Atherosclerotic heart disease of native coronary artery without angina pectoris: Secondary | ICD-10-CM | POA: Diagnosis not present

## 2019-02-08 DIAGNOSIS — I4891 Unspecified atrial fibrillation: Secondary | ICD-10-CM | POA: Diagnosis not present

## 2019-02-08 DIAGNOSIS — Z794 Long term (current) use of insulin: Secondary | ICD-10-CM | POA: Diagnosis not present

## 2019-02-08 DIAGNOSIS — Z89512 Acquired absence of left leg below knee: Secondary | ICD-10-CM | POA: Diagnosis not present

## 2019-02-08 DIAGNOSIS — Z4800 Encounter for change or removal of nonsurgical wound dressing: Secondary | ICD-10-CM | POA: Diagnosis not present

## 2019-02-08 DIAGNOSIS — E1151 Type 2 diabetes mellitus with diabetic peripheral angiopathy without gangrene: Secondary | ICD-10-CM | POA: Diagnosis not present

## 2019-02-08 DIAGNOSIS — L97519 Non-pressure chronic ulcer of other part of right foot with unspecified severity: Secondary | ICD-10-CM | POA: Diagnosis not present

## 2019-02-08 DIAGNOSIS — I350 Nonrheumatic aortic (valve) stenosis: Secondary | ICD-10-CM | POA: Diagnosis not present

## 2019-02-08 DIAGNOSIS — Z951 Presence of aortocoronary bypass graft: Secondary | ICD-10-CM | POA: Diagnosis not present

## 2019-02-08 DIAGNOSIS — N184 Chronic kidney disease, stage 4 (severe): Secondary | ICD-10-CM | POA: Diagnosis not present

## 2019-02-08 DIAGNOSIS — E785 Hyperlipidemia, unspecified: Secondary | ICD-10-CM | POA: Diagnosis not present

## 2019-02-08 DIAGNOSIS — D631 Anemia in chronic kidney disease: Secondary | ICD-10-CM | POA: Diagnosis not present

## 2019-02-08 DIAGNOSIS — E1122 Type 2 diabetes mellitus with diabetic chronic kidney disease: Secondary | ICD-10-CM | POA: Diagnosis not present

## 2019-02-08 DIAGNOSIS — I5032 Chronic diastolic (congestive) heart failure: Secondary | ICD-10-CM | POA: Diagnosis not present

## 2019-02-08 DIAGNOSIS — E11621 Type 2 diabetes mellitus with foot ulcer: Secondary | ICD-10-CM | POA: Diagnosis not present

## 2019-02-08 DIAGNOSIS — I130 Hypertensive heart and chronic kidney disease with heart failure and stage 1 through stage 4 chronic kidney disease, or unspecified chronic kidney disease: Secondary | ICD-10-CM | POA: Diagnosis not present

## 2019-02-13 DIAGNOSIS — N184 Chronic kidney disease, stage 4 (severe): Secondary | ICD-10-CM | POA: Diagnosis not present

## 2019-02-13 DIAGNOSIS — I4891 Unspecified atrial fibrillation: Secondary | ICD-10-CM | POA: Diagnosis not present

## 2019-02-13 DIAGNOSIS — Z9181 History of falling: Secondary | ICD-10-CM | POA: Diagnosis not present

## 2019-02-13 DIAGNOSIS — D631 Anemia in chronic kidney disease: Secondary | ICD-10-CM | POA: Diagnosis not present

## 2019-02-13 DIAGNOSIS — E1140 Type 2 diabetes mellitus with diabetic neuropathy, unspecified: Secondary | ICD-10-CM | POA: Diagnosis not present

## 2019-02-13 DIAGNOSIS — Z89512 Acquired absence of left leg below knee: Secondary | ICD-10-CM | POA: Diagnosis not present

## 2019-02-13 DIAGNOSIS — Z794 Long term (current) use of insulin: Secondary | ICD-10-CM | POA: Diagnosis not present

## 2019-02-13 DIAGNOSIS — L97519 Non-pressure chronic ulcer of other part of right foot with unspecified severity: Secondary | ICD-10-CM | POA: Diagnosis not present

## 2019-02-13 DIAGNOSIS — I130 Hypertensive heart and chronic kidney disease with heart failure and stage 1 through stage 4 chronic kidney disease, or unspecified chronic kidney disease: Secondary | ICD-10-CM | POA: Diagnosis not present

## 2019-02-13 DIAGNOSIS — I5032 Chronic diastolic (congestive) heart failure: Secondary | ICD-10-CM | POA: Diagnosis not present

## 2019-02-13 DIAGNOSIS — I350 Nonrheumatic aortic (valve) stenosis: Secondary | ICD-10-CM | POA: Diagnosis not present

## 2019-02-13 DIAGNOSIS — I2510 Atherosclerotic heart disease of native coronary artery without angina pectoris: Secondary | ICD-10-CM | POA: Diagnosis not present

## 2019-02-13 DIAGNOSIS — Z951 Presence of aortocoronary bypass graft: Secondary | ICD-10-CM | POA: Diagnosis not present

## 2019-02-13 DIAGNOSIS — Z4800 Encounter for change or removal of nonsurgical wound dressing: Secondary | ICD-10-CM | POA: Diagnosis not present

## 2019-02-13 DIAGNOSIS — E785 Hyperlipidemia, unspecified: Secondary | ICD-10-CM | POA: Diagnosis not present

## 2019-02-13 DIAGNOSIS — E11621 Type 2 diabetes mellitus with foot ulcer: Secondary | ICD-10-CM | POA: Diagnosis not present

## 2019-02-13 DIAGNOSIS — E1151 Type 2 diabetes mellitus with diabetic peripheral angiopathy without gangrene: Secondary | ICD-10-CM | POA: Diagnosis not present

## 2019-02-13 DIAGNOSIS — E1122 Type 2 diabetes mellitus with diabetic chronic kidney disease: Secondary | ICD-10-CM | POA: Diagnosis not present

## 2019-02-18 DIAGNOSIS — N390 Urinary tract infection, site not specified: Secondary | ICD-10-CM | POA: Diagnosis not present

## 2019-02-26 DIAGNOSIS — Z9181 History of falling: Secondary | ICD-10-CM | POA: Diagnosis not present

## 2019-02-26 DIAGNOSIS — Z0000 Encounter for general adult medical examination without abnormal findings: Secondary | ICD-10-CM | POA: Diagnosis not present

## 2019-02-26 DIAGNOSIS — Z89612 Acquired absence of left leg above knee: Secondary | ICD-10-CM | POA: Diagnosis not present

## 2019-02-26 DIAGNOSIS — E785 Hyperlipidemia, unspecified: Secondary | ICD-10-CM | POA: Diagnosis not present

## 2019-02-28 DIAGNOSIS — Z749 Problem related to care provider dependency, unspecified: Secondary | ICD-10-CM | POA: Diagnosis not present

## 2019-02-28 DIAGNOSIS — G63 Polyneuropathy in diseases classified elsewhere: Secondary | ICD-10-CM | POA: Diagnosis not present

## 2019-02-28 DIAGNOSIS — Z89612 Acquired absence of left leg above knee: Secondary | ICD-10-CM | POA: Diagnosis not present

## 2019-02-28 DIAGNOSIS — E119 Type 2 diabetes mellitus without complications: Secondary | ICD-10-CM | POA: Diagnosis not present

## 2019-03-16 DEATH — deceased

## 2019-04-03 ENCOUNTER — Encounter (HOSPITAL_COMMUNITY): Payer: Medicare Other
